# Patient Record
Sex: Male | Born: 1955 | Hispanic: Yes | Marital: Married
Health system: Southern US, Academic
[De-identification: ages and names within clinical notes are randomized; demographics above are authoritative.]

## PROBLEM LIST (undated history)

## (undated) ENCOUNTER — Ambulatory Visit: Attending: Internal Medicine | Primary: Internal Medicine

## (undated) ENCOUNTER — Encounter
Attending: Student in an Organized Health Care Education/Training Program | Primary: Student in an Organized Health Care Education/Training Program

## (undated) ENCOUNTER — Encounter

## (undated) ENCOUNTER — Telehealth

## (undated) ENCOUNTER — Ambulatory Visit

## (undated) ENCOUNTER — Telehealth
Attending: Student in an Organized Health Care Education/Training Program | Primary: Student in an Organized Health Care Education/Training Program

## (undated) ENCOUNTER — Encounter: Attending: Internal Medicine | Primary: Internal Medicine

## (undated) ENCOUNTER — Ambulatory Visit: Attending: Physician Assistant | Primary: Physician Assistant

## (undated) ENCOUNTER — Ambulatory Visit
Attending: Student in an Organized Health Care Education/Training Program | Primary: Student in an Organized Health Care Education/Training Program

## (undated) ENCOUNTER — Encounter: Attending: Nephrology | Primary: Nephrology

## (undated) ENCOUNTER — Ambulatory Visit: Attending: Pharmacist | Primary: Pharmacist

## (undated) ENCOUNTER — Institutional Professional Consult (permissible substitution)

## (undated) ENCOUNTER — Inpatient Hospital Stay

## (undated) ENCOUNTER — Ambulatory Visit: Attending: Nurse Practitioner | Primary: Nurse Practitioner

## (undated) ENCOUNTER — Ambulatory Visit: Attending: Ophthalmology | Primary: Ophthalmology

## (undated) DIAGNOSIS — E119 Type 2 diabetes mellitus without complications: Secondary | ICD-10-CM

## (undated) DIAGNOSIS — I447 Left bundle-branch block, unspecified: Secondary | ICD-10-CM

## (undated) DIAGNOSIS — Z992 Dependence on renal dialysis: Secondary | ICD-10-CM

## (undated) DIAGNOSIS — G4733 Obstructive sleep apnea (adult) (pediatric): Secondary | ICD-10-CM

## (undated) DIAGNOSIS — G4730 Sleep apnea, unspecified: Secondary | ICD-10-CM

## (undated) DIAGNOSIS — M109 Gout, unspecified: Secondary | ICD-10-CM

## (undated) DIAGNOSIS — N186 End stage renal disease: Secondary | ICD-10-CM

## (undated) DIAGNOSIS — N289 Disorder of kidney and ureter, unspecified: Secondary | ICD-10-CM

## (undated) DIAGNOSIS — I10 Essential (primary) hypertension: Secondary | ICD-10-CM

## (undated) HISTORY — DX: End stage renal disease: N18.6

## (undated) HISTORY — DX: Left bundle-branch block, unspecified: I44.7

## (undated) HISTORY — PX: AV FISTULA PLACEMENT: SHX1204

## (undated) HISTORY — DX: Dependence on renal dialysis: Z99.2

## (undated) HISTORY — PX: OTHER SURGICAL HISTORY: SHX169

## (undated) HISTORY — DX: Obstructive sleep apnea (adult) (pediatric): G47.33

## (undated) MED ORDER — GABAPENTIN 300 MG CAPSULE: 300 mg | capsule | Freq: Every day | 11 refills | 30 days | Status: CN

---

## 2005-07-11 ENCOUNTER — Emergency Department: Payer: Self-pay | Admitting: Emergency Medicine

## 2005-07-22 ENCOUNTER — Ambulatory Visit: Payer: Self-pay | Admitting: Nurse Practitioner

## 2007-10-03 ENCOUNTER — Emergency Department: Payer: Self-pay | Admitting: Emergency Medicine

## 2008-10-01 ENCOUNTER — Emergency Department: Payer: Self-pay | Admitting: Emergency Medicine

## 2008-10-23 ENCOUNTER — Emergency Department: Payer: Self-pay | Admitting: Emergency Medicine

## 2010-07-06 ENCOUNTER — Emergency Department: Payer: Self-pay | Admitting: Emergency Medicine

## 2011-01-21 ENCOUNTER — Emergency Department: Payer: Self-pay | Admitting: Emergency Medicine

## 2013-09-10 ENCOUNTER — Emergency Department: Payer: Self-pay | Admitting: Internal Medicine

## 2013-09-10 LAB — CBC
HCT: 40.4 % (ref 40.0–52.0)
RBC: 4.77 10*6/uL (ref 4.40–5.90)
RDW: 14.1 % (ref 11.5–14.5)
WBC: 8.8 10*3/uL (ref 3.8–10.6)

## 2013-09-10 LAB — CK TOTAL AND CKMB (NOT AT ARMC)
CK, Total: 134 U/L (ref 35–232)
CK-MB: 2.4 ng/mL (ref 0.5–3.6)

## 2013-09-10 LAB — URINALYSIS, COMPLETE
Blood: NEGATIVE
Glucose,UR: >=500 mg/dL (ref 0–75)
Ketone: NEGATIVE
Leukocyte Esterase: NEGATIVE
Nitrite: NEGATIVE
Ph: 7 (ref 4.5–8.0)
Protein: 100
RBC,UR: <1 /HPF (ref 0–5)
Specific Gravity: 1.003 (ref 1.003–1.030)
Squamous Epithelial: NONE SEEN

## 2013-09-10 LAB — COMPREHENSIVE METABOLIC PANEL
Albumin: 2.6 g/dL — ABNORMAL LOW (ref 3.4–5.0)
Alkaline Phosphatase: 141 U/L — ABNORMAL HIGH
Anion Gap: 9 (ref 7–16)
BUN: 43 mg/dL — ABNORMAL HIGH (ref 7–18)
Bilirubin,Total: 0.2 mg/dL (ref 0.2–1.0)
Calcium, Total: 8.5 mg/dL (ref 8.5–10.1)
Co2: 25 mmol/L (ref 21–32)
Creatinine: 2.47 mg/dL — ABNORMAL HIGH (ref 0.60–1.30)
EGFR (African American): 32 — ABNORMAL LOW
EGFR (Non-African Amer.): 28 — ABNORMAL LOW
Osmolality: 289 (ref 275–301)
Potassium: 3.4 mmol/L — ABNORMAL LOW (ref 3.5–5.1)
SGOT(AST): 22 U/L (ref 15–37)

## 2013-09-10 LAB — TROPONIN I: Troponin-I: <0.02 ng/mL

## 2015-11-12 DIAGNOSIS — E119 Type 2 diabetes mellitus without complications: Secondary | ICD-10-CM | POA: Insufficient documentation

## 2015-11-12 DIAGNOSIS — I120 Hypertensive chronic kidney disease with stage 5 chronic kidney disease or end stage renal disease: Secondary | ICD-10-CM | POA: Insufficient documentation

## 2015-11-12 DIAGNOSIS — Y658 Other specified misadventures during surgical and medical care: Secondary | ICD-10-CM | POA: Insufficient documentation

## 2015-11-12 DIAGNOSIS — T82898A Other specified complication of vascular prosthetic devices, implants and grafts, initial encounter: Secondary | ICD-10-CM | POA: Insufficient documentation

## 2015-11-12 DIAGNOSIS — N186 End stage renal disease: Secondary | ICD-10-CM | POA: Insufficient documentation

## 2015-11-12 DIAGNOSIS — M79602 Pain in left arm: Secondary | ICD-10-CM | POA: Insufficient documentation

## 2015-11-12 DIAGNOSIS — Z992 Dependence on renal dialysis: Secondary | ICD-10-CM | POA: Insufficient documentation

## 2015-11-13 ENCOUNTER — Emergency Department: Payer: Self-pay

## 2015-11-13 ENCOUNTER — Encounter: Payer: Self-pay | Admitting: Emergency Medicine

## 2015-11-13 ENCOUNTER — Emergency Department
Admission: EM | Admit: 2015-11-13 | Discharge: 2015-11-13 | Disposition: A | Discharge status: Home / Self Care | Payer: Self-pay | Attending: Emergency Medicine | Admitting: Emergency Medicine

## 2015-11-13 DIAGNOSIS — T82868A Thrombosis of vascular prosthetic devices, implants and grafts, initial encounter: Secondary | ICD-10-CM

## 2015-11-13 DIAGNOSIS — R52 Pain, unspecified: Secondary | ICD-10-CM

## 2015-11-13 DIAGNOSIS — T829XXA Unspecified complication of cardiac and vascular prosthetic device, implant and graft, initial encounter: Secondary | ICD-10-CM

## 2015-11-13 HISTORY — DX: Disorder of kidney and ureter, unspecified: N28.9

## 2015-11-13 HISTORY — DX: Type 2 diabetes mellitus without complications: E11.9

## 2015-11-13 HISTORY — DX: Gout, unspecified: M10.9

## 2015-11-13 HISTORY — DX: Essential (primary) hypertension: I10

## 2015-11-13 LAB — CBC WITH DIFFERENTIAL/PLATELET
BASOS PCT: 1 %
Basophils Absolute: 0.1 10*3/uL (ref 0–0.1)
EOS ABS: 0.3 10*3/uL (ref 0–0.7)
Eosinophils Relative: 4 %
HCT: 30.6 % — ABNORMAL LOW (ref 40.0–52.0)
Hemoglobin: 10.2 g/dL — ABNORMAL LOW (ref 13.0–18.0)
Lymphocytes Relative: 20 %
Lymphs Abs: 1.8 10*3/uL (ref 1.0–3.6)
MCH: 29.3 pg (ref 26.0–34.0)
MCHC: 33.3 g/dL (ref 32.0–36.0)
MCV: 87.9 fL (ref 80.0–100.0)
Monocytes Absolute: 0.9 10*3/uL (ref 0.2–1.0)
Monocytes Relative: 10 %
Neutro Abs: 5.7 10*3/uL (ref 1.4–6.5)
Neutrophils Relative %: 65 %
PLATELETS: 208 10*3/uL (ref 150–440)
RBC: 3.49 MIL/uL — AB (ref 4.40–5.90)
RDW: 14 % (ref 11.5–14.5)
WBC: 8.7 10*3/uL (ref 3.8–10.6)

## 2015-11-13 LAB — BASIC METABOLIC PANEL
ANION GAP: 10 (ref 5–15)
BUN: 60 mg/dL — AB (ref 6–20)
CHLORIDE: 94 mmol/L — AB (ref 101–111)
CO2: 24 mmol/L (ref 22–32)
Calcium: 7.7 mg/dL — ABNORMAL LOW (ref 8.9–10.3)
Creatinine, Ser: 9.65 mg/dL — ABNORMAL HIGH (ref 0.61–1.24)
GFR calc Af Amer: 6 mL/min — ABNORMAL LOW (ref >60)
GFR calc non Af Amer: 5 mL/min — ABNORMAL LOW (ref >60)
GLUCOSE: 261 mg/dL — AB (ref 65–99)
POTASSIUM: 4.9 mmol/L (ref 3.5–5.1)
Sodium: 128 mmol/L — ABNORMAL LOW (ref 135–145)

## 2015-11-13 MED ORDER — IBUPROFEN 100 MG/5ML PO SUSP
ORAL | Status: AC
  Filled 2015-11-13: qty 10

## 2015-11-13 NOTE — ED Notes (Signed)
Pt presents to ED with c/o bruising and slight swelling to his left arm after dialysis on Saturday. Pt states they had access his new fistula for the first time. Pt reports arm is painful to touch. Bruising noted. Pt also reports he has been fatigued today all day.

## 2015-11-13 NOTE — ED Notes (Signed)
Discussed discharge instructions and follow-up care with the patient and spouse, with pt's permission. No questions or concerns at this time. Pt stable at discharge. Interpretation provided by Adin Hector.

## 2015-11-13 NOTE — ED Notes (Signed)
Call placed to ultrasound dept who stated they are calling the reading room now to have [place] results completed.

## 2015-11-13 NOTE — ED Notes (Signed)
MD and interpreter to bedside to give patient an update on delay.

## 2015-11-13 NOTE — ED Provider Notes (Signed)
[facility] Emergency Department Provider Note  ____________________________________________  Time seen: 4:30 AM  I have reviewed the triage vital signs and the nursing notes.   HISTORY  Chief Complaint Fatigue; Bleeding/Bruising; and Vascular Access Problem      HPI Danny [name] is a 60 y.o. male presents with left arm pain and swelling and bruising status post dialysis on Saturday. Patient states current pain score 7 out of 10. Of note patient has a new fistula that was used for the first time this past Saturday.     Past Medical History  Diagnosis Date  . Kidney disease   . Hypertension   . Gout   . Diabetes mellitus without complication ([place])     There are no active problems to display for this patient.   Past Surgical History  Procedure Laterality Date  . Av fistula placement      No current outpatient prescriptions on file.  Allergies No known drug allergies No family history on file.  Social History Social History  Substance Use Topics  . Smoking status: Never Smoker   . Smokeless tobacco: Never Used  . Alcohol Use: No    Review of Systems  Constitutional: Negative for fever. Eyes: Negative for visual changes. ENT: Negative for sore throat. Cardiovascular: Negative for chest pain. Respiratory: Negative for shortness of breath. Gastrointestinal: Negative for abdominal pain, vomiting and diarrhea. Genitourinary: Negative for dysuria. Musculoskeletal: Negative for back pain. Positive for left arm pain and swelling Skin: Negative for rash. Neurological: Negative for headaches, focal weakness or numbness.   10-point ROS otherwise negative.  ____________________________________________   PHYSICAL EXAM:  VITAL SIGNS: ED Triage Vitals  Enc Vitals Group     BP 11/13/15 0116 177/77 mmHg     Pulse Rate 11/13/15 0116 81     Resp 11/13/15 0116 20     Temp 11/13/15 0116 98.2 F (36.8 C)     Temp Source 11/13/15 0116  Oral     SpO2 11/13/15 0116 97 %     Weight 11/13/15 0116 220 lb (99.791 kg)     Height 11/13/15 0116 5\' 8"  (1.727 m)     Head Cir --      Peak Flow --      Pain Score 11/13/15 0116 7     Pain Loc --      Pain Edu? --      Excl. in [place]? --     Constitutional: Alert and oriented. Well appearing and in no distress. Eyes: Conjunctivae are normal. PERRL. Normal extraocular movements. ENT   Head: Normocephalic and atraumatic.   Nose: No congestion/rhinnorhea.   Mouth/Throat: Mucous membranes are moist.   Neck: No stridor. Hematological/Lymphatic/Immunilogical: No cervical lymphadenopathy. Cardiovascular: Normal rate, regular rhythm. Normal and symmetric distal pulses are present in all extremities. No murmurs, rubs, or gallops. Respiratory: Normal respiratory effort without tachypnea nor retractions. Breath sounds are clear and equal bilaterally. No wheezes/rales/rhonchi. Gastrointestinal: Soft and nontender. No distention. There is no CVA tenderness. Genitourinary: deferred Musculoskeletal: Nontender with normal range of motion in all extremities. No joint effusions.  No lower extremity tenderness nor edema. Palpable thrill noted left arm AV fistula Neurologic:  Normal speech and language. No gross focal neurologic deficits are appreciated. Speech is normal.  Skin:  Skin is warm, dry and intact. No rash noted. Psychiatric: Mood and affect are normal. Speech and behavior are normal. Patient exhibits appropriate insight and judgment.  ____________________________________________    LABS (pertinent positives/negatives)  Labs Reviewed  CBC WITH DIFFERENTIAL/PLATELET - Abnormal; Notable for the following:    RBC 3.49 (*)    Hemoglobin 10.2 (*)    HCT 30.6 (*)    All other components within normal limits       INITIAL IMPRESSION / ASSESSMENT AND PLAN / ED COURSE  Pertinent labs & imaging results that were available during my care of the patient were reviewed by me and  considered in my medical decision making (see chart for details).  Patient's care transferred to Dr. Audelia Acton pending results of AV fistular Doppler.  ____________________________________________   FINAL CLINICAL IMPRESSION(S) / ED DIAGNOSES  Final diagnoses:  Complication of arteriovenous dialysis fistula, initial encounter      Gregor Hams, MD 11/14/15 2253

## 2015-11-13 NOTE — ED Notes (Signed)
RN called radiology to inquire on delay of study; unable to reach tech who is busy performing another study. MD called [facility] radiology to inquire about study and was advised that they do not read these studies at night and that the morning MD would read. MD placed order for DVT study at this time. This RN to U/S department; spoke with Cybil who advised that she evaluated vasculature with previous study and no DVT was seen. MD made aware and VORB was received to cancel ordered DVT study. MD to speak with patient regarding delay and POC as it stands at this point.

## 2015-11-13 NOTE — Discharge Instructions (Signed)
Vaya a su dialysis hoy.  Si tiene [employer], se estas mas hinchado, o si tiene otra problemmas, regresse aqui.

## 2015-11-13 NOTE — ED Provider Notes (Addendum)
-----------------------------------------   8:30 AM on 11/13/2015 -----------------------------------------  The patient was signed out to me at 7:30, he is due for dialysis today. However, he has had swelling to his arm since dialysis on Saturday. Ultrasound was obtained by prior physician. Potassium is reassuring. We are awaiting ultrasound read for further determination of disposition per Dr. Saul Fordyce plan. The ultrasound has not yet been read. I did call the radiology department and apparently there are some delay in the images. Once we require those results we formulate a plan for disposition based on the findings.  Schuyler Amor, MD 11/13/15 0831  ----------------------------------------- 9:03 AM on 11/13/2015 -----------------------------------------  A somewhat good pulses, there does seem to be evidence of hematoma which is been gradually tracking down since his last dialysis. However the fistulas well-functioning with a good bruit there is no strong clinical suspicion for deep vein thrombosis or other complication noted at this time. We'll discuss with nephrology. Patient does have follow-up in a few hours for his regular dialysis. We will hopefully be able to get him home in time for his dialysis. He is not markedly anemic at this time, he does have a very mild hyponatremia which is baseline for him, with correction for his glucoses sodium was in the low 130s.. Glucose is however not dangerously high. Dr. Juleen China agrees with management, did look at the ultrasound personally and asked me to discharge the patient to dialysis today and they will decide what to do from there. Patient in no acute distress.  Schuyler Amor, MD 11/13/15 [phone]

## 2016-02-19 ENCOUNTER — Encounter: Payer: Self-pay | Admitting: Emergency Medicine

## 2016-02-19 ENCOUNTER — Emergency Department
Admission: EM | Admit: 2016-02-19 | Discharge: 2016-02-20 | Disposition: A | Discharge status: Home / Self Care | Payer: Self-pay | Attending: Emergency Medicine | Admitting: Emergency Medicine

## 2016-02-19 DIAGNOSIS — Z992 Dependence on renal dialysis: Secondary | ICD-10-CM | POA: Insufficient documentation

## 2016-02-19 DIAGNOSIS — Z79899 Other long term (current) drug therapy: Secondary | ICD-10-CM | POA: Insufficient documentation

## 2016-02-19 DIAGNOSIS — I159 Secondary hypertension, unspecified: Secondary | ICD-10-CM

## 2016-02-19 DIAGNOSIS — Z794 Long term (current) use of insulin: Secondary | ICD-10-CM | POA: Insufficient documentation

## 2016-02-19 DIAGNOSIS — R51 Headache: Secondary | ICD-10-CM

## 2016-02-19 DIAGNOSIS — I120 Hypertensive chronic kidney disease with stage 5 chronic kidney disease or end stage renal disease: Secondary | ICD-10-CM | POA: Insufficient documentation

## 2016-02-19 DIAGNOSIS — N186 End stage renal disease: Secondary | ICD-10-CM | POA: Insufficient documentation

## 2016-02-19 DIAGNOSIS — E1122 Type 2 diabetes mellitus with diabetic chronic kidney disease: Secondary | ICD-10-CM | POA: Insufficient documentation

## 2016-02-19 DIAGNOSIS — R519 Headache, unspecified: Secondary | ICD-10-CM

## 2016-02-19 MED ORDER — ONDANSETRON HCL 4 MG/2ML IJ SOLN
4.0000 mg | Freq: Once | INTRAMUSCULAR | Status: AC
  Administered 2016-02-19: 4 mg via INTRAVENOUS
  Filled 2016-02-19: qty 2

## 2016-02-19 MED ORDER — MORPHINE SULFATE (PF) 2 MG/ML IV SOLN
2.0000 mg | Freq: Once | INTRAVENOUS | Status: AC
  Administered 2016-02-19: 2 mg via INTRAVENOUS
  Filled 2016-02-19: qty 1

## 2016-02-19 NOTE — ED Provider Notes (Signed)
[facility] Emergency Department Provider Note   ____________________________________________  Time seen: Approximately 11:32 PM  I have reviewed the triage vital signs and the nursing notes.   HISTORY  Chief Complaint Headache    HPI Danny [name] is a 60 y.o. male who presents to the ED from home via EMS with a chief complaint of headache. Patient has a history of ESRD formally on HD, started peritoneal dialysis yesterday and is attending all day classes this week to learn how to perform PD at home. Reports gradual onset global headache after class approximately 5 PM.Symptoms associated with photophobia and nausea. Patient also complains of pain at peritoneal dialysis catheter insertion site; catheter has been in place for 1 month. Denies associated fever, chills, vision changes, neck pain, chest pain, shortness of breath, abdominal pain, vomiting, diarrhea. Denies recent travel or trauma. Patient did not try analgesia prior to arrival. Nothing makes his symptoms worse.   Past Medical History  Diagnosis Date  . Kidney disease   . Hypertension   . Gout   . Diabetes mellitus without complication ([place])     There are no active problems to display for this patient.   Past Surgical History  Procedure Laterality Date  . Av fistula placement      Current Outpatient Rx  Name  Route  Sig  Dispense  Refill  . calcitRIOL (ROCALTROL) 0.25 MCG capsule   Oral   Take 0.25 mcg by mouth 3 (three) times a week.         . calcium acetate (PHOSLO) 667 MG capsule   Oral   Take 1,334 mg by mouth 3 (three) times daily with meals.         . citalopram (CELEXA) 20 MG tablet   Oral   Take 10 mg by mouth daily.         . furosemide (LASIX) 40 MG tablet   Oral   Take 40 mg by mouth 2 (two) times daily.         . insulin aspart protamine- aspart (NOVOLOG MIX 70/30) (70-30) 100 UNIT/ML injection   Subcutaneous   Inject 18-20 Units into the skin 2 (two)  times daily with a meal. 20 units in the morning and 18 units at night         . Liraglutide 18 MG/3ML SOPN   Subcutaneous   Inject 1.8 mg into the skin daily.          . prochlorperazine (COMPAZINE) 5 MG tablet      1 tablet every 6 hours as needed for headache/nausea   20 tablet   0     Allergies Review of patient's allergies indicates no known allergies.  Family history None for migraines or cerebral aneurysm  Social History Social History  Substance Use Topics  . Smoking status: Never Smoker   . Smokeless tobacco: Never Used  . Alcohol Use: No    Review of Systems  Constitutional: No fever/chills. Eyes: Positive for photophobia. No visual changes. ENT: No sore throat. Cardiovascular: Denies chest pain. Respiratory: Denies shortness of breath. Gastrointestinal: No abdominal pain.  No nausea, no vomiting.  No diarrhea.  No constipation. Genitourinary: Negative for dysuria. Musculoskeletal: Negative for back pain. Skin: Negative for rash. Neurological: Positive for headache. Negative for focal weakness or numbness.  10-point ROS otherwise negative.  ____________________________________________   PHYSICAL EXAM:  VITAL SIGNS: ED Triage Vitals  Enc Vitals Group     BP 02/19/16 2254 199/99 mmHg  Pulse Rate 02/19/16 2254 87     Resp 02/19/16 2254 22     Temp 02/19/16 2254 98.1 F (36.7 C)     Temp Source 02/19/16 2254 Oral     SpO2 02/19/16 2254 96 %     Weight 02/19/16 2254 220 lb (99.791 kg)     Height 02/19/16 2254 5\' 9"  (1.753 m)     Head Cir --      Peak Flow --      Pain Score 02/19/16 2255 8     Pain Loc --      Pain Edu? --      Excl. in [place]? --     Constitutional: Alert and oriented. Well appearing and in mild acute distress. Eyes: Conjunctivae are normal. PERRL. EOMI. Head: Atraumatic. Nose: No congestion/rhinnorhea. Mouth/Throat: Mucous membranes are moist.  Oropharynx non-erythematous. Neck: No stridor.  Supple neck without  meningismus.  No carotid bruits. Cardiovascular: Normal rate, regular rhythm. Grossly normal heart sounds.  Good peripheral circulation. Respiratory: Normal respiratory effort.  No retractions. Lungs CTAB. Gastrointestinal: Soft and nontender to light and deep palpation. No distention. No abdominal bruits. No CVA tenderness. PD insertion site clean/dry/intact. Musculoskeletal: No lower extremity tenderness nor edema.  No joint effusions. Neurologic:  Normal speech and language. No gross focal neurologic deficits are appreciated. No gait instability. Skin:  Skin is warm, dry and intact. No rash noted. Psychiatric: Mood and affect are normal. Speech and behavior are normal.  ____________________________________________   LABS (all labs ordered are listed, but only abnormal results are displayed)  Labs Reviewed  CBC WITH DIFFERENTIAL/PLATELET - Abnormal; Notable for the following:    WBC 11.7 (*)    RBC 3.76 (*)    Hemoglobin 11.2 (*)    HCT 32.8 (*)    RDW 15.2 (*)    Neutro Abs 9.4 (*)    All other components within normal limits  COMPREHENSIVE METABOLIC PANEL - Abnormal; Notable for the following:    Sodium 133 (*)    Chloride 96 (*)    Glucose, Bld 164 (*)    BUN 60 (*)    Creatinine, Ser 12.11 (*)    Calcium 8.8 (*)    Albumin 3.3 (*)    ALT 14 (*)    GFR calc non Af Amer 4 (*)    GFR calc Af Amer 5 (*)    All other components within normal limits  URINALYSIS COMPLETEWITH MICROSCOPIC ([facility] ONLY) - Abnormal; Notable for the following:    Color, Urine YELLOW (*)    APPearance CLEAR (*)    Glucose, UA >500 (*)    Protein, ur >500 (*)    Squamous Epithelial / LPF 0-5 (*)    All other components within normal limits   ____________________________________________  EKG  ED ECG REPORT I, Laylanie Kruczek J, the attending physician, personally viewed and interpreted this ECG.   Date: 02/20/2016  EKG Time: 0108  Rate: 85  Rhythm: normal EKG, normal sinus rhythm  Axis: Normal   Intervals:none  ST&T Change: Nonspecific  ____________________________________________  RADIOLOGY  CT Head interpreted per Dr. Radene Knee: 1. No acute intracranial pathology seen on CT. 2. Mild cerebellar atrophy noted. ____________________________________________   PROCEDURES  Procedure(s) performed: None  Critical Care performed: No  ____________________________________________   INITIAL IMPRESSION / ASSESSMENT AND PLAN / ED COURSE  Pertinent labs & imaging results that were available during my care of the patient were reviewed by me and considered in my medical decision making (see chart for  details).  60 year old male with ESRD who recently switched to peritoneal dialysis presenting with gradual onset, global headache without focal neurological deficit. Will check electrolytes, obtain CT head, check urinalysis and reassess. There is no clinical concern for SBP on exam.  ----------------------------------------- 1:19 AM on 02/20/2016 -----------------------------------------  Patient is feeling better. Updated patient and family of CT and lab results. Awaiting urinalysis. Will administer hydralazine for elevated blood pressure.   ----------------------------------------- 1:52 AM on 02/20/2016 -----------------------------------------  Patient feeling even better. Blood pressure improved. Updated all on urinalysis results. Patient will resume peritoneal dialysis class in the morning. I have advised that he have the nurse or nephrologist check his peritoneal dialysis catheter. Strict return precautions given. All verbalize understanding and agree with plan of care. ____________________________________________   FINAL CLINICAL IMPRESSION(S) / ED DIAGNOSES  Final diagnoses:  Acute nonintractable headache, unspecified headache type  Secondary hypertension, unspecified      NEW MEDICATIONS STARTED DURING THIS VISIT:  New Prescriptions   PROCHLORPERAZINE (COMPAZINE) 5 MG  TABLET    1 tablet every 6 hours as needed for headache/nausea     Note:  This document was prepared using Dragon voice recognition software and may include unintentional dictation errors.    Paulette Blanch, MD 02/20/16 [phone]

## 2016-02-19 NOTE — ED Notes (Signed)
Pt arrived via ems from home with complaints of severe headache and overall not feeling well. Received dialysis today. EMS vitals BP 220/110, 86HR, & glucose 152.

## 2016-02-20 ENCOUNTER — Emergency Department: Payer: Self-pay

## 2016-02-20 LAB — CBC WITH DIFFERENTIAL/PLATELET
BASOS ABS: 0 10*3/uL (ref 0–0.1)
Basophils Relative: 0 %
Eosinophils Absolute: 0.4 10*3/uL (ref 0–0.7)
Eosinophils Relative: 3 %
HEMATOCRIT: 32.8 % — AB (ref 40.0–52.0)
Hemoglobin: 11.2 g/dL — ABNORMAL LOW (ref 13.0–18.0)
LYMPHS ABS: 1.2 10*3/uL (ref 1.0–3.6)
MCH: 29.9 pg (ref 26.0–34.0)
MCHC: 34.3 g/dL (ref 32.0–36.0)
MCV: 87.3 fL (ref 80.0–100.0)
MONO ABS: 0.7 10*3/uL (ref 0.2–1.0)
NEUTROS ABS: 9.4 10*3/uL — AB (ref 1.4–6.5)
Neutrophils Relative %: 81 %
Platelets: 192 10*3/uL (ref 150–440)
RBC: 3.76 MIL/uL — ABNORMAL LOW (ref 4.40–5.90)
RDW: 15.2 % — AB (ref 11.5–14.5)
WBC: 11.7 10*3/uL — ABNORMAL HIGH (ref 3.8–10.6)

## 2016-02-20 LAB — COMPREHENSIVE METABOLIC PANEL
ALT: 14 U/L — ABNORMAL LOW (ref 17–63)
AST: 15 U/L (ref 15–41)
Albumin: 3.3 g/dL — ABNORMAL LOW (ref 3.5–5.0)
Alkaline Phosphatase: 70 U/L (ref 38–126)
Anion gap: 14 (ref 5–15)
BILIRUBIN TOTAL: 0.4 mg/dL (ref 0.3–1.2)
BUN: 60 mg/dL — AB (ref 6–20)
CO2: 23 mmol/L (ref 22–32)
Calcium: 8.8 mg/dL — ABNORMAL LOW (ref 8.9–10.3)
Chloride: 96 mmol/L — ABNORMAL LOW (ref 101–111)
Creatinine, Ser: 12.11 mg/dL — ABNORMAL HIGH (ref 0.61–1.24)
GFR calc Af Amer: 5 mL/min — ABNORMAL LOW (ref >60)
GFR, EST NON AFRICAN AMERICAN: 4 mL/min — AB (ref >60)
Glucose, Bld: 164 mg/dL — ABNORMAL HIGH (ref 65–99)
POTASSIUM: 4.5 mmol/L (ref 3.5–5.1)
Sodium: 133 mmol/L — ABNORMAL LOW (ref 135–145)
TOTAL PROTEIN: 6.9 g/dL (ref 6.5–8.1)

## 2016-02-20 LAB — URINALYSIS COMPLETE WITH MICROSCOPIC (ARMC ONLY)
BILIRUBIN URINE: NEGATIVE
Bacteria, UA: NONE SEEN
Hgb urine dipstick: NEGATIVE
KETONES UR: NEGATIVE mg/dL
Leukocytes, UA: NEGATIVE
Nitrite: NEGATIVE
Specific Gravity, Urine: 1.014 (ref 1.005–1.030)
pH: 7 (ref 5.0–8.0)

## 2016-02-20 MED ORDER — HYDRALAZINE HCL 20 MG/ML IJ SOLN
10.0000 mg | Freq: Once | INTRAMUSCULAR | Status: AC
  Administered 2016-02-20: 10 mg via INTRAVENOUS
  Filled 2016-02-20: qty 1

## 2016-02-20 MED ORDER — PROCHLORPERAZINE MALEATE 5 MG PO TABS: ORAL_TABLET | ORAL | Status: DC

## 2016-02-20 NOTE — Discharge Instructions (Signed)
1. You may take Compazine (#20) as needed for headache. 2. Return to the ER for worsening symptoms, persistent vomiting, difficulty breathing or other concerns.  Dolor de cabeza general sin causa (General Headache Without Cause) El dolor de cabeza es un dolor o malestar que se siente en la zona de la cabeza o del cuello. Puede no tener una causa especfica. Hay muchas causas y tipos de dolores de [place]. Los dolores de cabeza ms comunes son los siguientes:  Cefalea tensional.  Cefaleas migraosas.  Cefalea en brotes.  Cefaleas diarias crnicas. INSTRUCCIONES PARA EL CUIDADO EN EL HOGAR  Controle su afeccin para ver si hay cambios. Siga estos pasos para Aeronautical engineer afeccin: Control del [employer] medicamentos de venta libre y los recetados solamente como se lo haya indicado el mdico.  Cuando sienta dolor de cabeza acustese en un cuarto oscuro y tranquilo.  Si se lo indican, aplique hielo sobre la cabeza y la zona del cuello:  Ponga el hielo en una bolsa plstica.  Coloque una toalla entre la piel y la bolsa de hielo.  Coloque el hielo durante 74minutos, 2 a 3veces por Training and development officer.  Utilice una almohadilla trmica o tome una ducha con agua caliente para aplicar calor en la cabeza y la zona del cuello como se lo haya indicado el [place] luces tenues si le [employer] luces brillantes o sus dolores de cabeza empeoran. Comida y bebida  Mantenga un horario para las comidas.  Limite el consumo de bebidas alcohlicas.  Consuma menos cantidad de cafena o deje de tomarla. Instrucciones generales  Concurra a todas las visitas de control como se lo haya indicado el mdico. Esto es importante.  Lleve un diario de los dolores de cabeza para Neurosurgeon qu factores pueden desencadenarlos. Por ejemplo, escriba los siguientes datos:  Lo que usted come y Buyer, retail.  Cunto tiempo duerme.  Algn cambio en su dieta o en los medicamentos.  Pruebe algunas tcnicas de  relajacin, como los [place].  Limite el estrs.  Sintese con la espalda recta y no tense los msculos.  No consuma productos que contengan tabaco, incluidos cigarrillos, tabaco de Higher education careers adviser o cigarrillos electrnicos. Si necesita ayuda para dejar de fumar, consulte al mdico.  Haga actividad fsica habitualmente como se lo haya indicado el mdico.  Tenga un horario fijo para dormir. Duerma entre 7 y 9horas o la cantidad de horas que le haya recomendado el mdico. SOLICITE ATENCIN MDICA SI:   Los medicamentos no Dealer los sntomas.  Tiene un dolor de cabeza que es diferente del dolor de cabeza habitual.  Tiene nuseas o vmitos.  Tiene fiebre. SOLICITE ATENCIN MDICA DE INMEDIATO SI:   El dolor se hace cada vez ms intenso.  Ha vomitado repetidas veces.  Presenta rigidez en el cuello.  Sufre prdida de la visin.  Tiene problemas para hablar.  Siente dolor en el ojo o en el odo.  Presenta debilidad muscular o prdida del control muscular.  Pierde el equilibrio o tiene problemas para Writer.  Sufre mareos o se desmaya.  Se siente confundido.   Esta informacin no tiene Marine scientist el consejo del mdico. Asegrese de hacerle al mdico cualquier pregunta que tenga.   Document Released: 06/25/2005 Document Revised: 06/06/2015 Elsevier Interactive Patient Education 2016 [place].  Hipertensin (Hypertension) La hipertensin, conocida comnmente como presin arterial alta, se produce cuando la sangre bombea en las arterias con mucha fuerza. Las arterias son los vasos sanguneos que transportan la [place]  desde el corazn hacia todas las partes del cuerpo. Una lectura de la presin arterial consiste en un nmero ms alto sobre un nmero ms bajo, por ejemplo, 110/72. El nmero ms alto (presin sistlica) corresponde a la presin interna de las arterias cuando el corazn [place]. El nmero ms bajo (presin diastlica) corresponde a la presin  interna de las arterias cuando el corazn se relaja. En condiciones ideales, la presin arterial debe ser inferior a 120/80. La hipertensin fuerza al corazn a trabajar ms para Herbalist. Las arterias pueden estrecharse o ponerse rgidas. La hipertensin no tratada o no controlada puede causar infarto de miocardio, ictus, enfermedad renal y otros problemas. [place] de riesgo de hipertensin son controlables, pero otros no lo son.  Entre los factores de riesgo que usted no puede Chief Technology Officer, se [employer] siguientes:   La raza. El riesgo es mayor para las Retail banker.  La edad. Los riesgos aumentan con la edad.  El sexo. Antes de los 45aos, los hombres corren ms [employer]. Despus de los 65aos, las mujeres corren ms [employer]. Entre los factores de riesgo que usted puede Chief Technology Officer, se [employer] siguientes:  No hacer la cantidad suficiente de actividad fsica o ejercicio.  Tener sobrepeso.  Consumir mucha grasa, azcar, caloras o sal en la dieta.  Beber alcohol en exceso. SIGNOS Y SNTOMAS Por lo general, la hipertensin no causa signos o sntomas. La hipertensin arterial demasiado alta (crisis hipertensiva) puede causar dolor de cabeza, ansiedad, falta de aire y hemorragia nasal. DIAGNSTICO Para detectar si usted tiene hipertensin, el mdico le medir la presin arterial mientras est sentado, con el brazo levantado a la altura del corazn. Debe medirla al [facility] veces en el mismo brazo. Determinadas condiciones pueden causar una diferencia de presin arterial entre el brazo izquierdo y Insurance underwriter. El hecho de tener una sola lectura de la presin arterial ms alta que lo normal no significa que Stage manager. Si no est claro si tiene hipertensin arterial, es posible que se le pida que regrese otro da para volver a controlarle la presin arterial. O bien se le puede pedir que se controle la  presin arterial en su casa durante 1 o ms meses. [place] hipertensin arterial incluye hacer cambios en el estilo de vida y, posiblemente, tomar medicamentos. Un estilo de vida saludable puede ayudar a bajar la presin arterial alta. Quiz deba cambiar algunos hbitos. Los cambios en el estilo de vida pueden incluir lo siguiente:  Seguir la dieta DASH. Esta dieta tiene un alto contenido de frutas, verduras y Psychologist, prison and probation services. Incluye poca cantidad de sal, carnes rojas y azcares agregados.  Mantenga el consumo de sodio por debajo de 2300 mg por da.  Realizar al [employer] 30 y 86 minutos de ejercicio [place], 4 veces por semana como mnimo.  Perder peso, si es necesario.  No fumar.  Limitar el consumo de bebidas alcohlicas.  Aprender formas de reducir el estrs. El mdico puede recetarle medicamentos si los cambios en el estilo de vida no son suficientes para Child psychotherapist la presin arterial y si una de las siguientes afirmaciones es verdadera:  Fidel Levy 74 y 42 aos y su presin arterial sistlica est por encima de 140.  Tiene 60 aos o ms y su presin arterial sistlica est por encima de 150.  Su presin arterial diastlica est por encima de 90.  Tiene diabetes y su presin arterial  sistlica est por encima de 140 o su presin arterial diastlica est por encima de 90.  Tiene una enfermedad renal y su presin arterial est por encima de 140/90.  Tiene una enfermedad cardaca y su presin arterial est por encima de 140/90. La presin arterial deseada puede variar en funcin de las enfermedades, la edad y otros factores personales. INSTRUCCIONES PARA EL CUIDADO EN EL HOGAR  Haga que le midan de nuevo la presin arterial segn las indicaciones del [place] los medicamentos solamente como se lo haya indicado el mdico. Siga cuidadosamente las indicaciones. Los medicamentos para la presin arterial deben tomarse segn las  indicaciones. Los medicamentos pierden eficacia al omitir las dosis. El hecho de omitir las dosis tambin [place] el riesgo de otros problemas.  No fume.  Contrlese la presin arterial en su casa segn las indicaciones del mdico. SOLICITE ATENCIN MDICA SI:   Piensa que tiene una reaccin alrgica a los medicamentos.  Tiene mareos o dolores de cabeza con Scientist, research (physical sciences).  Tiene hinchazn en los tobillos.  Tiene problemas de visin. SOLICITE ATENCIN MDICA DE INMEDIATO SI:  Siente un dolor de cabeza intenso o confusin.  Siente debilidad inusual, adormecimiento o que Geneticist, molecular.  Siente dolor intenso en el pecho o en el abdomen.  Vomita repetidas veces.  Tiene dificultad para respirar. ASEGRESE DE QUE:   Comprende estas instrucciones.  Controlar su afeccin.  Recibir ayuda de inmediato si no mejora o si empeora.   Esta informacin no tiene Marine scientist el consejo del mdico. Asegrese de hacerle al mdico cualquier pregunta que tenga.   Document Released: 09/15/2005 Document Revised: 01/30/2015 Elsevier Interactive Patient Education [employer].

## 2016-05-13 ENCOUNTER — Inpatient Hospital Stay
Admission: EM | Admit: 2016-05-13 | Discharge: 2016-05-14 | DRG: 189 | Disposition: A | Discharge status: Home / Self Care | Payer: Self-pay | Attending: Internal Medicine | Admitting: Internal Medicine

## 2016-05-13 ENCOUNTER — Emergency Department: Payer: Self-pay

## 2016-05-13 DIAGNOSIS — R0789 Other chest pain: Secondary | ICD-10-CM

## 2016-05-13 DIAGNOSIS — N2581 Secondary hyperparathyroidism of renal origin: Secondary | ICD-10-CM | POA: Diagnosis present

## 2016-05-13 DIAGNOSIS — Z794 Long term (current) use of insulin: Secondary | ICD-10-CM

## 2016-05-13 DIAGNOSIS — N186 End stage renal disease: Secondary | ICD-10-CM | POA: Diagnosis present

## 2016-05-13 DIAGNOSIS — E8770 Fluid overload, unspecified: Secondary | ICD-10-CM | POA: Diagnosis present

## 2016-05-13 DIAGNOSIS — Z992 Dependence on renal dialysis: Secondary | ICD-10-CM

## 2016-05-13 DIAGNOSIS — I120 Hypertensive chronic kidney disease with stage 5 chronic kidney disease or end stage renal disease: Secondary | ICD-10-CM | POA: Diagnosis present

## 2016-05-13 DIAGNOSIS — E875 Hyperkalemia: Secondary | ICD-10-CM | POA: Diagnosis present

## 2016-05-13 DIAGNOSIS — E1122 Type 2 diabetes mellitus with diabetic chronic kidney disease: Secondary | ICD-10-CM | POA: Diagnosis present

## 2016-05-13 DIAGNOSIS — Z79899 Other long term (current) drug therapy: Secondary | ICD-10-CM

## 2016-05-13 DIAGNOSIS — J810 Acute pulmonary edema: Principal | ICD-10-CM | POA: Diagnosis present

## 2016-05-13 DIAGNOSIS — J811 Chronic pulmonary edema: Secondary | ICD-10-CM | POA: Diagnosis present

## 2016-05-13 DIAGNOSIS — D638 Anemia in other chronic diseases classified elsewhere: Secondary | ICD-10-CM | POA: Diagnosis present

## 2016-05-13 HISTORY — DX: Dependence on renal dialysis: Z99.2

## 2016-05-13 LAB — BASIC METABOLIC PANEL
Anion gap: 12 (ref 5–15)
BUN: 48 mg/dL — ABNORMAL HIGH (ref 6–20)
CHLORIDE: 101 mmol/L (ref 101–111)
CO2: 24 mmol/L (ref 22–32)
CREATININE: 10.57 mg/dL — AB (ref 0.61–1.24)
Calcium: 8.9 mg/dL (ref 8.9–10.3)
GFR, EST AFRICAN AMERICAN: 5 mL/min — AB (ref >60)
GFR, EST NON AFRICAN AMERICAN: 5 mL/min — AB (ref >60)
Glucose, Bld: 130 mg/dL — ABNORMAL HIGH (ref 65–99)
Potassium: 5.5 mmol/L — ABNORMAL HIGH (ref 3.5–5.1)
SODIUM: 137 mmol/L (ref 135–145)

## 2016-05-13 LAB — CBC WITH DIFFERENTIAL/PLATELET
BASOS PCT: 1 %
Basophils Absolute: 0.1 10*3/uL (ref 0–0.1)
EOS ABS: 0.3 10*3/uL (ref 0–0.7)
Eosinophils Relative: 3 %
HCT: 28.2 % — ABNORMAL LOW (ref 40.0–52.0)
HEMOGLOBIN: 9.5 g/dL — AB (ref 13.0–18.0)
Lymphocytes Relative: 14 %
Lymphs Abs: 1.2 10*3/uL (ref 1.0–3.6)
MCH: 30.2 pg (ref 26.0–34.0)
MCHC: 33.7 g/dL (ref 32.0–36.0)
MCV: 89.7 fL (ref 80.0–100.0)
MONOS PCT: 8 %
Monocytes Absolute: 0.7 10*3/uL (ref 0.2–1.0)
NEUTROS PCT: 74 %
Neutro Abs: 6.9 10*3/uL — ABNORMAL HIGH (ref 1.4–6.5)
PLATELETS: 258 10*3/uL (ref 150–440)
RBC: 3.14 MIL/uL — AB (ref 4.40–5.90)
RDW: 15.9 % — ABNORMAL HIGH (ref 11.5–14.5)
WBC: 9.1 10*3/uL (ref 3.8–10.6)

## 2016-05-13 LAB — RENAL FUNCTION PANEL
ALBUMIN: 3.7 g/dL (ref 3.5–5.0)
ANION GAP: 15 (ref 5–15)
BUN: 21 mg/dL — ABNORMAL HIGH (ref 6–20)
CALCIUM: 8.9 mg/dL (ref 8.9–10.3)
CO2: 27 mmol/L (ref 22–32)
Chloride: 95 mmol/L — ABNORMAL LOW (ref 101–111)
Creatinine, Ser: 6.34 mg/dL — ABNORMAL HIGH (ref 0.61–1.24)
GFR, EST AFRICAN AMERICAN: 10 mL/min — AB (ref >60)
GFR, EST NON AFRICAN AMERICAN: 9 mL/min — AB (ref >60)
GLUCOSE: 282 mg/dL — AB (ref 65–99)
PHOSPHORUS: 3.8 mg/dL (ref 2.5–4.6)
POTASSIUM: 4.4 mmol/L (ref 3.5–5.1)
SODIUM: 137 mmol/L (ref 135–145)

## 2016-05-13 LAB — CBC
HEMATOCRIT: 31.3 % — AB (ref 40.0–52.0)
HEMOGLOBIN: 10.5 g/dL — AB (ref 13.0–18.0)
MCH: 29.9 pg (ref 26.0–34.0)
MCHC: 33.6 g/dL (ref 32.0–36.0)
MCV: 89.1 fL (ref 80.0–100.0)
Platelets: 265 10*3/uL (ref 150–440)
RBC: 3.51 MIL/uL — AB (ref 4.40–5.90)
RDW: 15.9 % — ABNORMAL HIGH (ref 11.5–14.5)
WBC: 7.8 10*3/uL (ref 3.8–10.6)

## 2016-05-13 LAB — TROPONIN I
TROPONIN I: 0.04 ng/mL — AB (ref <0.03)
TROPONIN I: 0.04 ng/mL — AB (ref <0.03)
Troponin I: 0.03 ng/mL (ref <0.03)
Troponin I: 0.03 ng/mL (ref <0.03)

## 2016-05-13 LAB — CREATININE, SERUM
CREATININE: 6.22 mg/dL — AB (ref 0.61–1.24)
GFR, EST AFRICAN AMERICAN: 10 mL/min — AB (ref >60)
GFR, EST NON AFRICAN AMERICAN: 9 mL/min — AB (ref >60)

## 2016-05-13 LAB — GLUCOSE, CAPILLARY: Glucose-Capillary: 323 mg/dL — ABNORMAL HIGH (ref 65–99)

## 2016-05-13 MED ORDER — HEPARIN SODIUM (PORCINE) 5000 UNIT/ML IJ SOLN
5000.0000 [IU] | Freq: Three times a day (TID) | INTRAMUSCULAR | Status: DC
  Administered 2016-05-13 – 2016-05-14 (×2): 5000 [IU] via SUBCUTANEOUS
  Filled 2016-05-13 (×2): qty 1

## 2016-05-13 MED ORDER — HEPARIN SODIUM (PORCINE) 1000 UNIT/ML DIALYSIS
1000.0000 [IU] | INTRAMUSCULAR | Status: DC | PRN
  Filled 2016-05-13: qty 1

## 2016-05-13 MED ORDER — CALCITRIOL 0.25 MCG PO CAPS
0.2500 ug | ORAL_CAPSULE | ORAL | Status: DC
  Administered 2016-05-14: 0.25 ug via ORAL
  Filled 2016-05-13: qty 1

## 2016-05-13 MED ORDER — LIDOCAINE HCL (PF) 1 % IJ SOLN
5.0000 mL | INTRAMUSCULAR | Status: DC | PRN
  Filled 2016-05-13: qty 5

## 2016-05-13 MED ORDER — INSULIN ASPART 100 UNIT/ML ~~LOC~~ SOLN
0.0000 [IU] | Freq: Three times a day (TID) | SUBCUTANEOUS | Status: DC
  Administered 2016-05-14 (×2): 2 [IU] via SUBCUTANEOUS
  Filled 2016-05-13 (×2): qty 2

## 2016-05-13 MED ORDER — LIRAGLUTIDE 18 MG/3ML ~~LOC~~ SOPN: 1.8000 mg | PEN_INJECTOR | Freq: Every day | SUBCUTANEOUS | Status: DC

## 2016-05-13 MED ORDER — CITALOPRAM HYDROBROMIDE 10 MG PO TABS
10.0000 mg | ORAL_TABLET | Freq: Every day | ORAL | Status: DC
  Administered 2016-05-13 – 2016-05-14 (×2): 10 mg via ORAL
  Filled 2016-05-13 (×2): qty 1

## 2016-05-13 MED ORDER — SODIUM CHLORIDE 0.9% FLUSH
3.0000 mL | Freq: Two times a day (BID) | INTRAVENOUS | Status: DC
  Administered 2016-05-13 – 2016-05-14 (×2): 3 mL via INTRAVENOUS

## 2016-05-13 MED ORDER — SODIUM CHLORIDE 0.9 % IV SOLN: 100.0000 mL | INTRAVENOUS | Status: DC | PRN

## 2016-05-13 MED ORDER — ACETAMINOPHEN 325 MG PO TABS: 650.0000 mg | ORAL_TABLET | Freq: Four times a day (QID) | ORAL | Status: DC | PRN

## 2016-05-13 MED ORDER — ONDANSETRON HCL 4 MG PO TABS: 4.0000 mg | ORAL_TABLET | Freq: Four times a day (QID) | ORAL | Status: DC | PRN

## 2016-05-13 MED ORDER — ACETAMINOPHEN 650 MG RE SUPP: 650.0000 mg | Freq: Four times a day (QID) | RECTAL | Status: DC | PRN

## 2016-05-13 MED ORDER — PENTAFLUOROPROP-TETRAFLUOROETH EX AERO
1.0000 "application " | INHALATION_SPRAY | CUTANEOUS | Status: DC | PRN
  Filled 2016-05-13: qty 30

## 2016-05-13 MED ORDER — INSULIN ASPART PROT & ASPART (70-30 MIX) 100 UNIT/ML ~~LOC~~ SUSP
18.0000 [IU] | Freq: Two times a day (BID) | SUBCUTANEOUS | Status: DC
  Administered 2016-05-14: 20 [IU] via SUBCUTANEOUS
  Filled 2016-05-13: qty 20

## 2016-05-13 MED ORDER — LIDOCAINE-PRILOCAINE 2.5-2.5 % EX CREA
1.0000 "application " | TOPICAL_CREAM | CUTANEOUS | Status: DC | PRN
  Filled 2016-05-13: qty 5

## 2016-05-13 MED ORDER — ALTEPLASE 2 MG IJ SOLR: 2.0000 mg | Freq: Once | INTRAMUSCULAR | Status: DC | PRN

## 2016-05-13 MED ORDER — CALCIUM ACETATE (PHOS BINDER) 667 MG PO CAPS
1334.0000 mg | ORAL_CAPSULE | Freq: Three times a day (TID) | ORAL | Status: DC
  Administered 2016-05-14 (×2): 1334 mg via ORAL
  Filled 2016-05-13 (×2): qty 2

## 2016-05-13 MED ORDER — INSULIN ASPART 100 UNIT/ML ~~LOC~~ SOLN
0.0000 [IU] | Freq: Every day | SUBCUTANEOUS | Status: DC
  Administered 2016-05-13: 3 [IU] via SUBCUTANEOUS
  Filled 2016-05-13: qty 3
  Filled 2016-05-13: qty 4

## 2016-05-13 MED ORDER — CALCIUM ACETATE (PHOS BINDER) 667 MG PO CAPS
1334.0000 mg | ORAL_CAPSULE | Freq: Once | ORAL | Status: AC
  Administered 2016-05-13: 1334 mg via ORAL
  Filled 2016-05-13: qty 2

## 2016-05-13 MED ORDER — HYDRALAZINE HCL 20 MG/ML IJ SOLN
10.0000 mg | Freq: Four times a day (QID) | INTRAMUSCULAR | Status: DC | PRN
  Administered 2016-05-13: 10 mg via INTRAVENOUS
  Filled 2016-05-13: qty 1

## 2016-05-13 MED ORDER — FUROSEMIDE 40 MG PO TABS
40.0000 mg | ORAL_TABLET | Freq: Two times a day (BID) | ORAL | Status: DC
  Administered 2016-05-13 – 2016-05-14 (×2): 40 mg via ORAL
  Filled 2016-05-13 (×2): qty 1

## 2016-05-13 MED ORDER — ONDANSETRON HCL 4 MG/2ML IJ SOLN: 4.0000 mg | Freq: Four times a day (QID) | INTRAMUSCULAR | Status: DC | PRN

## 2016-05-13 NOTE — H&P (Signed)
[place] [facility] [place] NAME: Blaire Shaner    MR#:  [id number]  DATE OF BIRTH:  1956/03/12  DATE OF ADMISSION:  05/13/2016  PRIMARY CARE PHYSICIAN: Dr. Urban Gibson  REQUESTING/REFERRING PHYSICIAN: Dr. Lisa Roca  CHIEF COMPLAINT:   Chief Complaint  Patient presents with  . Cough  . Chest Pain    HISTORY OF PRESENT ILLNESS:  Kiryn Lersch  is a 60 y.o. male with a known history of Hypertension, insulin-dependent diabetes mellitus, end-stage renal disease started on dialysis in October 2016 presents to the hospital secondary to chest pressure and shortness of breath that started last night. Patient's last dialysis was per schedule last Saturday. He said he felt fine after the dialysis. Last night he woke up in the middle of the night, with significant chest pressure with associated nausea and difficulty breathing. Since then he was unable to lay flat due to difficulty breathing. His urine output is minimal considering he is a dialysis patient. He is on Lasix every day. Denies eating any extra salt or drinking extra fluids over the weekend. Is due for dialysis today, but due to his chest pressure presents to the emergency room. Troponin is 0.04 and remained stable. Chest x-ray with significant pulmonary edema. No prior cardiac history noted. Also complains of dry cough.  PAST MEDICAL HISTORY:   Past Medical History:  Diagnosis Date  . Diabetes mellitus without complication ([place])   . Dialysis patient [facility])    tuesday, thursday and saturday  . Gout   . Hypertension   . Kidney disease    PAST SURGICAL HISTORY:   Past Surgical History:  Procedure Laterality Date  . AV FISTULA PLACEMENT    . miscellaneous     peritoneal dialysis catheter placement and removal    SOCIAL HISTORY:   Social History  Substance Use Topics  . Smoking status: Never Smoker  . Smokeless tobacco: Never Used  . Alcohol use No    FAMILY HISTORY:   Family  History  Problem Relation Age of Onset  . Diabetes Mother   . Prostate cancer Father   . Kidney disease Sister     DRUG ALLERGIES:  No Known Allergies  REVIEW OF SYSTEMS:   Review of Systems  Constitutional: Positive for malaise/fatigue. Negative for chills, fever and weight loss.  HENT: Negative for ear discharge, ear pain, hearing loss and nosebleeds.   Eyes: Negative for blurred vision, double vision and photophobia.  Respiratory: Positive for cough and shortness of breath. Negative for hemoptysis and wheezing.   Cardiovascular: Positive for chest pain and orthopnea. Negative for palpitations and leg swelling.  Gastrointestinal: Positive for nausea. Negative for abdominal pain, constipation, diarrhea, heartburn, melena and vomiting.  Genitourinary: Negative for dysuria, frequency, hematuria and urgency.  Musculoskeletal: Negative for back pain, myalgias and neck pain.  Skin: Negative for rash.  Neurological: Negative for dizziness, tingling, tremors, sensory change, speech change, focal weakness and headaches.  Endo/Heme/Allergies: Does not bruise/bleed easily.  Psychiatric/Behavioral: Negative for depression.    MEDICATIONS AT HOME:   Prior to Admission medications   Medication Sig Start Date End Date Taking? Authorizing Provider  calcitRIOL (ROCALTROL) 0.25 MCG capsule Take 0.25 mcg by mouth 3 (three) times a week.   Yes Historical Provider, MD  calcium acetate (PHOSLO) 667 MG capsule Take 1,334 mg by mouth 3 (three) times daily with meals.   Yes Historical Provider, MD  citalopram (CELEXA) 20 MG tablet Take 10 mg by  mouth daily.   Yes Historical Provider, MD  furosemide (LASIX) 40 MG tablet Take 40 mg by mouth 2 (two) times daily.   Yes Historical Provider, MD  insulin aspart protamine- aspart (NOVOLOG MIX 70/30) (70-30) 100 UNIT/ML injection Inject 18-20 Units into the skin 2 (two) times daily with a meal. 20 units in the morning and 18 units at night   Yes Historical  Provider, MD  Liraglutide 18 MG/3ML SOPN Inject 1.8 mg into the skin daily.    Yes Historical Provider, MD  prochlorperazine (COMPAZINE) 5 MG tablet 1 tablet every 6 hours as needed for headache/nausea 02/20/16  Yes Paulette Blanch, MD      VITAL SIGNS:  Blood pressure (!) 179/85, pulse 81, temperature 98.5 F (36.9 C), temperature source Oral, resp. rate 17, height 5\' 8"  (1.727 m), weight 104 kg (229 lb 4.5 oz), SpO2 99 %.  PHYSICAL EXAMINATION:   Physical Exam  GENERAL:  60 y.o.-year-old patient sitting in the bed with no acute distress.  EYES: Pupils equal, round, reactive to light and accommodation. No scleral icterus. Extraocular muscles intact.  HEENT: Head atraumatic, normocephalic. Oropharynx and nasopharynx clear.  NECK:  Supple, no jugular venous distention. No thyroid enlargement, no tenderness.  LUNGS: Normal breath sounds bilaterally, no wheezing,rhonchi. Fine bibasilar crackles heard. No use of accessory muscles of respiration.  CARDIOVASCULAR: S1, S2 normal. No murmurs, rubs, or gallops.  ABDOMEN: Soft, nontender, nondistended. Bowel sounds present. No organomegaly or mass.  EXTREMITIES: No pedal edema, cyanosis, or clubbing. Left upper arm AV fistula present with good thrill, 1+ left hand edema noted. NEUROLOGIC: Cranial nerves II through XII are intact. Muscle strength 5/5 in all extremities. Sensation intact. Gait not checked.  PSYCHIATRIC: The patient is alert and oriented x 3.  SKIN: No obvious rash, lesion, or ulcer.   LABORATORY PANEL:   CBC  Recent Labs Lab 05/13/16 0840  WBC 9.1  HGB 9.5*  HCT 28.2*  PLT 258   ------------------------------------------------------------------------------------------------------------------  Chemistries   Recent Labs Lab 05/13/16 0840  NA 137  K 5.5*  CL 101  CO2 24  GLUCOSE 130*  BUN 48*  CREATININE 10.57*  CALCIUM 8.9    ------------------------------------------------------------------------------------------------------------------  Cardiac Enzymes  Recent Labs Lab 05/13/16 1223  TROPONINI 0.04*   ------------------------------------------------------------------------------------------------------------------  RADIOLOGY:  Dg Chest Port 1 View  Result Date: 05/13/2016 CLINICAL DATA:  Cough and shortness of Breath EXAM: PORTABLE CHEST 1 VIEW COMPARISON:  09/10/2013 FINDINGS: Cardiac shadow is mildly enlarged. Aortic calcifications are again noted. Increased vascular congestion and interstitial edema is noted likely related to volume overload due to the patient's missed dialysis session. No focal infiltrate is seen. No bony abnormality is noted. IMPRESSION: Volume overload and pulmonary edema likely related to recently missed dialysis. Electronically Signed   By: Inez Catalina M.D.   On: 05/13/2016 08:23    EKG:   Orders placed or performed during the hospital encounter of 05/13/16  . EKG 12-Lead  . EKG 12-Lead  . ED EKG  . ED EKG    IMPRESSION AND PLAN:   Chadric Omohundro  is a 60 y.o. male with a known history of Hypertension, insulin-dependent diabetes mellitus, end-stage renal disease started on dialysis in October 2016 presents to the hospital secondary to chest pressure and shortness of breath that started last night.  #1 acute pulmonary edema-fluid retention. Denies any dietary noncompliance. - admit for dialysis. -Nephrology consulted. Continue Lasix twice a day. Low sodium diet recommended. Also fluid restriction. -check echocardiogram -  Recycle troponins to rule out any cardiac cause for his chest pressure.  #2 hyperkalemia-secondary to renal failure. Due for dialysis today.  #3 hypertension-not on any home medications. IV hydralazine here as needed. Likely will improve after dialysis  #4 diabetes mellitus-continue with victoza and home insulin. Also sliding scale insulin.  #5 DVT  prophylaxis-on subcutaneous heparin  #6 anemia of chronic disease-monitor. Likely Epogen with dialysis     All the records are reviewed and case discussed with ED provider. Management plans discussed with the patient, family and they are in agreement.  CODE STATUS: Full code  TOTAL TIME TAKING CARE OF THIS PATIENT: 50 minutes.    Gladstone Lighter M.D on 05/13/2016 at 2:33 PM  Between 7am to 6pm - Pager - [phone]  After 6pm go to [email or web address] - password [email or web address] [place] [facility]  Office  [phone]  CC: Primary care physician; Pcp Not In System

## 2016-05-13 NOTE — Progress Notes (Signed)
PRE HD ASSESSMENT 

## 2016-05-13 NOTE — Progress Notes (Signed)
Post hd assessment 

## 2016-05-13 NOTE — ED Notes (Signed)
Patient transported to dialysis via wheelchair by Ronalee Belts, [employer].

## 2016-05-13 NOTE — Progress Notes (Signed)
POST HD VITALS

## 2016-05-13 NOTE — ED Triage Notes (Signed)
Pt c/o cough with SOB and tightness in chest since yesterday and when he called dialysis center they referred him to the ED.. States he receives dialysis tues, thurs and saturdays.. Did not receive dialysis today

## 2016-05-13 NOTE — Progress Notes (Signed)
END OF HD 

## 2016-05-13 NOTE — ED Provider Notes (Signed)
[facility] Emergency Department Provider Note ____________________________________________   I have reviewed the triage vital signs and the triage nursing note.  HISTORY  Chief Complaint Cough and Chest Pain   Historian Patient and wife  HPI Danny [name] is a 60 y.o. male dialysis patient, dialyzes TTHS, due for dialysis today.  He describes onset of chest heaviness and chest discomfort with shortness of breath since last night at midnight. It's worse when he is lying flat and he feels like he has to cough. Cough is nonproductive. No fevers or chills. No nausea or vomiting. No history of coronary artery disease. No palpitations. He states he does not know if he is above his dry weight. He reports that he has not had an episode like this before where he would've had pulmonary edema before dialysis.  Symptoms are moderate especially when he is lying flat. However still present when he is sitting on the stretcher.    Past Medical History:  Diagnosis Date  . Diabetes mellitus without complication ([place])   . Dialysis patient [facility])    tuesday, thursday and saturday  . Gout   . Hypertension   . Kidney disease     There are no active problems to display for this patient.   Past Surgical History:  Procedure Laterality Date  . AV FISTULA PLACEMENT      Prior to Admission medications   Medication Sig Start Date End Date Taking? Authorizing Provider  calcitRIOL (ROCALTROL) 0.25 MCG capsule Take 0.25 mcg by mouth 3 (three) times a week.   Yes Historical Provider, MD  calcium acetate (PHOSLO) 667 MG capsule Take 1,334 mg by mouth 3 (three) times daily with meals.   Yes Historical Provider, MD  citalopram (CELEXA) 20 MG tablet Take 10 mg by mouth daily.   Yes Historical Provider, MD  furosemide (LASIX) 40 MG tablet Take 40 mg by mouth 2 (two) times daily.   Yes Historical Provider, MD  insulin aspart protamine- aspart (NOVOLOG MIX 70/30) (70-30) 100 UNIT/ML  injection Inject 18-20 Units into the skin 2 (two) times daily with a meal. 20 units in the morning and 18 units at night   Yes Historical Provider, MD  Liraglutide 18 MG/3ML SOPN Inject 1.8 mg into the skin daily.    Yes Historical Provider, MD  prochlorperazine (COMPAZINE) 5 MG tablet 1 tablet every 6 hours as needed for headache/nausea 02/20/16  Yes Paulette Blanch, MD    No Known Allergies  No family history on file.  Social History Social History  Substance Use Topics  . Smoking status: Never Smoker  . Smokeless tobacco: Never Used  . Alcohol use No    Review of Systems  Constitutional: Negative for fever. Eyes: Negative for visual changes. ENT: Negative for sore throat. Cardiovascular: Positive for central chest pressure across both sides and the top of the chest. No pleuritic chest pain. Respiratory: Positive for shortness of breath.  No wheezing or history wheezing. Gastrointestinal: Negative for abdominal pain, vomiting and diarrhea. Genitourinary: Negative for dysuria. Musculoskeletal: Negative for back pain. Skin: Negative for rash. Neurological: Negative for headache. 10 point Review of Systems otherwise negative ____________________________________________   PHYSICAL EXAM:  VITAL SIGNS: ED Triage Vitals [05/13/16 0743]  Enc Vitals Group     BP (!) 196/84     Pulse Rate 89     Resp 20     Temp 98.5 F (36.9 C)     Temp Source Oral     SpO2 95 %  Weight 229 lb 4.5 oz (104 kg)     Height 5\' 8"  (1.727 m)     Head Circumference      Peak Flow      Pain Score 8     Pain Loc      Pain Edu?      Excl. in [place]?      Constitutional: Alert and oriented. Well appearing and in no distress. HEENT   Head: Normocephalic and atraumatic.      Eyes: Conjunctivae are normal. PERRL. Normal extraocular movements.      Ears:         Nose: No congestion/rhinnorhea.   Mouth/Throat: Mucous membranes are moist.   Neck: No stridor. Cardiovascular/Chest: Normal  rate, regular rhythm.  No murmurs, rubs, or gallops. Respiratory: Normal respiratory effort without tachypnea nor retractions. Breath sounds are Somewhat decreased at both bases, without rhonchi, rales or wheezes appreciated. Gastrointestinal: Soft. No distention, no guarding, no rebound. Nontender.    Genitourinary/rectal:Deferred Musculoskeletal: Nontender with normal range of motion in all extremities. No joint effusions.  No lower extremity tenderness.  Minimal/trace lower extremity pitting edema bilaterally. Neurologic:  Normal speech and language. No gross or focal neurologic deficits are appreciated. Skin:  Skin is warm, dry and intact. No rash noted. Psychiatric: Mood and affect are normal. Speech and behavior are normal. Patient exhibits appropriate insight and judgment.  ____________________________________________   EKG I, Lisa Roca, MD, the attending physician have personally viewed and interpreted all ECGs.  90 bpm normal sinus rhythm. Narrow QRS. Normal axis. Normal ST and T-wave ____________________________________________  LABS (pertinent positives/negatives)  Labs Reviewed  CBC WITH DIFFERENTIAL/PLATELET - Abnormal; Notable for the following:       Result Value   RBC 3.14 (*)    Hemoglobin 9.5 (*)    HCT 28.2 (*)    RDW 15.9 (*)    Neutro Abs 6.9 (*)    All other components within normal limits  BASIC METABOLIC PANEL - Abnormal; Notable for the following:    Potassium 5.5 (*)    Glucose, Bld 130 (*)    BUN 48 (*)    Creatinine, Ser 10.57 (*)    GFR calc non Af Amer 5 (*)    GFR calc Af Amer 5 (*)    All other components within normal limits  TROPONIN I - Abnormal; Notable for the following:    Troponin I 0.04 (*)    All other components within normal limits  TROPONIN I - Abnormal; Notable for the following:    Troponin I 0.04 (*)    All other components within normal limits    ____________________________________________  RADIOLOGY All Xrays were  viewed by me. Imaging interpreted by Radiologist.  Chest x-ray portable:  Volume overload and pulmonary edema likely related to recently missed dialysis __________________________________________  PROCEDURES  Procedure(s) performed: None  Critical Care performed: None  ____________________________________________   ED COURSE / ASSESSMENT AND PLAN  Pertinent labs & imaging results that were available during my care of the patient were reviewed by me and considered in my medical decision making (see chart for details).   Patient describing atypical chest discomfort that seems to be position related to lying flat and associated with shortness of breath consistent with most likely clinical diagnosis pulmonary edema with a history of due for dialysis today over the weekend.  Symptoms do not sound consistent with a history of upper respiratory symptoms, chest x-ray is negative for pneumonia, and symptoms do not seem consistent with  PE.  Troponin was 0.042.  I spoke with Dr. Zollie Scale, nephrology about hospital dialysis for pulmonary edema and chest discomfort.    CONSULTATIONS:  Dr. Holley Raring, nephrology, recommends hospital admission and in-hospital dialysis. Dr. Charlean Sanfilippo, hospitalist for admission.   Patient / Family / Caregiver informed of clinical course, medical decision-making process, and agree with plan.    ___________________________________________   FINAL CLINICAL IMPRESSION(S) / ED DIAGNOSES   Final diagnoses:  Atypical chest pain  Acute pulmonary edema ([place])              Note: This dictation was prepared with Dragon dictation. Any transcriptional errors that result from this process are unintentional    Lisa Roca, MD 05/13/16 [phone]

## 2016-05-13 NOTE — Progress Notes (Signed)
HD START 

## 2016-05-13 NOTE — Progress Notes (Signed)
PRE HD INFO 

## 2016-05-14 LAB — BASIC METABOLIC PANEL
ANION GAP: 8 (ref 5–15)
BUN: 27 mg/dL — ABNORMAL HIGH (ref 6–20)
CHLORIDE: 98 mmol/L — AB (ref 101–111)
CO2: 29 mmol/L (ref 22–32)
CREATININE: 7.41 mg/dL — AB (ref 0.61–1.24)
Calcium: 8.8 mg/dL — ABNORMAL LOW (ref 8.9–10.3)
GFR calc non Af Amer: 7 mL/min — ABNORMAL LOW (ref >60)
GFR, EST AFRICAN AMERICAN: 8 mL/min — AB (ref >60)
Glucose, Bld: 234 mg/dL — ABNORMAL HIGH (ref 65–99)
POTASSIUM: 4.1 mmol/L (ref 3.5–5.1)
SODIUM: 135 mmol/L (ref 135–145)

## 2016-05-14 LAB — CBC
HEMATOCRIT: 29.8 % — AB (ref 40.0–52.0)
Hemoglobin: 10.1 g/dL — ABNORMAL LOW (ref 13.0–18.0)
MCH: 30.2 pg (ref 26.0–34.0)
MCHC: 34 g/dL (ref 32.0–36.0)
MCV: 88.9 fL (ref 80.0–100.0)
PLATELETS: 258 10*3/uL (ref 150–440)
RBC: 3.36 MIL/uL — AB (ref 4.40–5.90)
RDW: 15.7 % — ABNORMAL HIGH (ref 11.5–14.5)
WBC: 7 10*3/uL (ref 3.8–10.6)

## 2016-05-14 LAB — HEPATITIS B SURFACE ANTIBODY,QUALITATIVE: Hep B S Ab: NONREACTIVE

## 2016-05-14 LAB — GLUCOSE, CAPILLARY
GLUCOSE-CAPILLARY: 169 mg/dL — AB (ref 65–99)
GLUCOSE-CAPILLARY: 155 mg/dL — AB (ref 65–99)

## 2016-05-14 LAB — MRSA PCR SCREENING: MRSA by PCR: NEGATIVE

## 2016-05-14 LAB — HEPATITIS B SURFACE ANTIGEN: Hepatitis B Surface Ag: NEGATIVE

## 2016-05-14 LAB — TROPONIN I: Troponin I: 0.03 ng/mL

## 2016-05-14 LAB — HEMOGLOBIN A1C: Hgb A1c MFr Bld: 7.5 % — ABNORMAL HIGH (ref 4.0–6.0)

## 2016-05-14 MED ORDER — INSULIN ASPART PROT & ASPART (70-30 MIX) 100 UNIT/ML ~~LOC~~ SUSP: 18.0000 [IU] | Freq: Every day | SUBCUTANEOUS | Status: DC

## 2016-05-14 MED ORDER — HYDRALAZINE HCL 25 MG PO TABS: 25.0000 mg | ORAL_TABLET | Freq: Three times a day (TID) | ORAL | 1 refills | Status: DC

## 2016-05-14 MED ORDER — HYDRALAZINE HCL 25 MG PO TABS: 25.0000 mg | ORAL_TABLET | Freq: Three times a day (TID) | ORAL | Status: DC

## 2016-05-14 MED ORDER — INSULIN ASPART PROT & ASPART (70-30 MIX) 100 UNIT/ML ~~LOC~~ SUSP: 20.0000 [IU] | Freq: Every day | SUBCUTANEOUS | Status: DC

## 2016-05-14 NOTE — Progress Notes (Signed)
A&O. Admitted from home with shortness of breath. Dialysis TTS. Underwent dialysis before coming to floor. Currently no shortness of breath or distress of any kind. Family at side. Wound to abdomen noted, cleaned and dressed. WIll continue to monitor.

## 2016-05-14 NOTE — Discharge Summary (Signed)
[facility] [place] NAME: Danny [name]    MR#:  [id number]  DATE OF BIRTH:  1956/05/10  DATE OF ADMISSION:  05/13/2016 ADMITTING PHYSICIAN: Gladstone Lighter, MD  DATE OF DISCHARGE: 05/14/16  PRIMARY CARE PHYSICIAN: Pcp Not In System    ADMISSION DIAGNOSIS:  Acute pulmonary edema (H[facility]) [J81.0] Atypical chest pain [R07.89]  DISCHARGE DIAGNOSIS:  Acute Pulmonary edema with volume overload  ESRD on HD  SECONDARY DIAGNOSIS:   Past Medical History:  Diagnosis Date  . Diabetes mellitus without complication ([place])   . Dialysis patient [facility])    tuesday, thursday and saturday  . Gout   . Hypertension   . Kidney disease     HOSPITAL COURSE:  Danny [name]  is a 60 y.o. male with a known history of Hypertension, insulin-dependent diabetes mellitus, end-stage renal disease started on dialysis in October 2016 presents to the hospital secondary to chest pressure and shortness of breath that started last night.  #1 acute pulmonary edema-fluid retention. Denies any dietary noncompliance. -s/p HD with 3.5Liters -Nephrology consulted.  -Continue Lasix twice a day. Low sodium diet recommended. Also fluid restriction. -feels back to baseline. sats >92%  #2 hyperkalemia-secondary to renal failure. Due for dialysis today. k 4.1  #3 hypertension-not on any home medications. -will add hydralazine - IV hydralazine here as needed.   #4 diabetes mellitus-continue with victoza and home insulin. Also sliding scale insulin.  #5 DVT prophylaxis-on subcutaneous heparin  #6 anemia of chronic disease-monitor. Likely Epogen with dialysis  Overall stable. D/c home  CONSULTS OBTAINED:  Treatment Team:  Munsoor Holley Raring, MD  DRUG ALLERGIES:  No Known Allergies  DISCHARGE MEDICATIONS:   Current Discharge Medication List    START taking these medications   Details  hydrALAZINE (APRESOLINE) 25 MG tablet Take 1 tablet (25 mg total) by  mouth every 8 (eight) hours. Qty: 90 tablet, Refills: 1      CONTINUE these medications which have NOT CHANGED   Details  calcitRIOL (ROCALTROL) 0.25 MCG capsule Take 0.25 mcg by mouth 3 (three) times a week.    calcium acetate (PHOSLO) 667 MG capsule Take 1,334 mg by mouth 3 (three) times daily with meals.    citalopram (CELEXA) 20 MG tablet Take 10 mg by mouth daily.    furosemide (LASIX) 40 MG tablet Take 40 mg by mouth 2 (two) times daily.    insulin aspart protamine- aspart (NOVOLOG MIX 70/30) (70-30) 100 UNIT/ML injection Inject 18-20 Units into the skin 2 (two) times daily with a meal. 20 units in the morning and 18 units at night    Liraglutide 18 MG/3ML SOPN Inject 1.8 mg into the skin daily.     prochlorperazine (COMPAZINE) 5 MG tablet 1 tablet every 6 hours as needed for headache/nausea Qty: 20 tablet, Refills: 0        If you experience worsening of your admission symptoms, develop shortness of breath, life threatening emergency, suicidal or homicidal thoughts you must seek medical attention immediately by calling 911 or calling your MD immediately  if symptoms less severe.  You Must read complete instructions/literature along with all the possible adverse reactions/side effects for all the Medicines you take and that have been prescribed to you. Take any new Medicines after you have completely understood and accept all the possible adverse reactions/side effects.   Please note  You were cared for by a hospitalist during your hospital stay. If you have any questions about your  discharge medications or the care you received while you were in the hospital after you are discharged, you can call the unit and asked to speak with the hospitalist on call if the hospitalist that took care of you is not available. Once you are discharged, your primary care physician will handle any further medical issues. Please note that NO REFILLS for any discharge medications will be authorized  once you are discharged, as it is imperative that you return to your primary care physician (or establish a relationship with a primary care physician if you do not have one) for your aftercare needs so that they can reassess your need for medications and monitor your lab values. Today   SUBJECTIVE   Doing well. Feels back to baseline  VITAL SIGNS:  Blood pressure (!) 186/81, pulse 88, temperature 98.7 F (37.1 C), temperature source Oral, resp. rate 18, height 5\' 8"  (1.727 m), weight 222 lb 12.8 oz (101.1 kg), SpO2 97 %.  I/O:    Intake/Output Summary (Last 24 hours) at 05/14/16 1351 Last data filed at 05/14/16 1345  Gross per 24 hour  Intake              480 ml  Output             3700 ml  Net            -3220 ml    PHYSICAL EXAMINATION:  GENERAL:  60 y.o.-year-old patient lying in the bed with no acute distress.  EYES: Pupils equal, round, reactive to light and accommodation. No scleral icterus. Extraocular muscles intact.  HEENT: Head atraumatic, normocephalic. Oropharynx and nasopharynx clear.  NECK:  Supple, no jugular venous distention. No thyroid enlargement, no tenderness.  LUNGS: Normal breath sounds bilaterally, no wheezing, rales,rhonchi or crepitation. No use of accessory muscles of respiration.  CARDIOVASCULAR: S1, S2 normal. No murmurs, rubs, or gallops.  ABDOMEN: Soft, non-tender, non-distended. Bowel sounds present. No organomegaly or mass.  EXTREMITIES: No pedal edema, cyanosis, or clubbing.  NEUROLOGIC: Cranial nerves II through XII are intact. Muscle strength 5/5 in all extremities. Sensation intact. Gait not checked.  PSYCHIATRIC: The patient is alert and oriented x 3.  SKIN: No obvious rash, lesion, or ulcer.   DATA REVIEW:   CBC   Recent Labs Lab 05/14/16 0248  WBC 7.0  HGB 10.1*  HCT 29.8*  PLT 258    Chemistries   Recent Labs Lab 05/14/16 0248  NA 135  K 4.1  CL 98*  CO2 29  GLUCOSE 234*  BUN 27*  CREATININE 7.41*  CALCIUM 8.8*     Microbiology Results   Recent Results (from the past 240 hour(s))  MRSA PCR Screening     Status: None   Collection Time: 05/13/16  8:04 PM  Result Value Ref Range Status   MRSA by PCR NEGATIVE NEGATIVE Final    Comment:        The GeneXpert MRSA Assay (FDA approved for NASAL specimens only), is one component of a comprehensive MRSA colonization surveillance program. It is not intended to diagnose MRSA infection nor to guide or monitor treatment for MRSA infections.     RADIOLOGY:  Dg Chest Port 1 View  Result Date: 05/13/2016 CLINICAL DATA:  Cough and shortness of Breath EXAM: PORTABLE CHEST 1 VIEW COMPARISON:  09/10/2013 FINDINGS: Cardiac shadow is mildly enlarged. Aortic calcifications are again noted. Increased vascular congestion and interstitial edema is noted likely related to volume overload due to the patient's missed dialysis session. No  focal infiltrate is seen. No bony abnormality is noted. IMPRESSION: Volume overload and pulmonary edema likely related to recently missed dialysis. Electronically Signed   By: Inez Catalina M.D.   On: 05/13/2016 08:23     Management plans discussed with the patient, family and they are in agreement.  CODE STATUS:     Code Status Orders        Start     Ordered   05/13/16 1955  Full code  Continuous     05/13/16 1954    Code Status History    Date Active Date Inactive Code Status Order ID Comments User Context   This patient has a current code status but no historical code status.      TOTAL TIME TAKING CARE OF THIS PATIENT: 40 minutes.    Christino Mcglinchey M.D on 05/14/2016 at 1:51 PM  Between 7am to 6pm - Pager - [phone] After 6pm go to [email or web address] - password [email or web address] [facility]  Office  [phone]  CC: Primary care physician; Pcp Not In System

## 2016-05-14 NOTE — Consult Note (Signed)
[facility] CONSULT NOTE    Date: 05/14/2016                  Patient Name:  Danny [name]  MRN: [id number]  DOB: Aug 06, 1956  Age / Sex: 60 y.o., male         PCP: Pcp Not In System                 Service Requesting Consult: Hospitalist                 Reason for Consult: Management of ESRD            History of Present Illness: Patient is a 60 y.o. male with a PMHx of long-standing diabetes mellitus type 2, end-stage renal disease on hemodialysis TTS, gout, hypertension, anemia chronic kidney disease, secondary hyperparathyroidism, who was admitted to [facility] on 05/13/2016 for evaluation of chest pain, shortness of breath, and cough.  The symptoms began the night prior to admission.  He states that he woke up in the middle of the nightand developed chest pressure as well as shortness of breath.  He believes that he may have had increase fluid intake over the weekend.  He completed his dialysis treatment on Saturday.  He called the dialysis unit on Tuesday with the symptoms and advised him to come to the emergency department.   Medications: Outpatient medications: Prescriptions Prior to Admission  Medication Sig Dispense Refill Last Dose  . calcitRIOL (ROCALTROL) 0.25 MCG capsule Take 0.25 mcg by mouth 3 (three) times a week.   Past Week at Unknown time  . calcium acetate (PHOSLO) 667 MG capsule Take 1,334 mg by mouth 3 (three) times daily with meals.   05/12/2016 at Unknown time  . citalopram (CELEXA) 20 MG tablet Take 10 mg by mouth daily.   05/12/2016 at Unknown time  . furosemide (LASIX) 40 MG tablet Take 40 mg by mouth 2 (two) times daily.   05/12/2016 at Unknown time  . insulin aspart protamine- aspart (NOVOLOG MIX 70/30) (70-30) 100 UNIT/ML injection Inject 18-20 Units into the skin 2 (two) times daily with a meal. 20 units in the morning and 18 units at night   05/12/2016 at Unknown time  . Liraglutide 18 MG/3ML SOPN Inject 1.8 mg into the skin daily.     05/12/2016 at Unknown time  . prochlorperazine (COMPAZINE) 5 MG tablet 1 tablet every 6 hours as needed for headache/nausea 20 tablet 0 prn at prn    Current medications: Current Facility-Administered Medications  Medication Dose Route Frequency Provider Last Rate Last Dose  . 0.9 %  sodium chloride infusion  100 mL Intravenous PRN Dominque Levandowski, MD      . 0.9 %  sodium chloride infusion  100 mL Intravenous PRN Uchenna Seufert, MD      . acetaminophen (TYLENOL) tablet 650 mg  650 mg Oral Q6H PRN Gladstone Lighter, MD       Or  . acetaminophen (TYLENOL) suppository 650 mg  650 mg Rectal Q6H PRN Gladstone Lighter, MD      . alteplase (CATHFLO ACTIVASE) injection 2 mg  2 mg Intracatheter Once PRN Kali Deadwyler, MD      . calcitRIOL (ROCALTROL) capsule 0.25 mcg  0.25 mcg Oral Once per day on Mon Wed Fri Gladstone Lighter, MD   0.25 mcg at 05/14/16 [id number]  . calcium acetate (PHOSLO) capsule 1,334 mg  1,334 mg Oral TID WC Gladstone Lighter, MD   1,334 mg at 05/14/16  1138  . citalopram (CELEXA) tablet 10 mg  10 mg Oral Daily Gladstone Lighter, MD   10 mg at 05/14/16 [id number]  . furosemide (LASIX) tablet 40 mg  40 mg Oral BID Gladstone Lighter, MD   40 mg at 05/14/16 0833  . heparin injection 1,000 Units  1,000 Units Dialysis PRN Armistead Sult, MD      . heparin injection 5,000 Units  5,000 Units Subcutaneous Q8H Gladstone Lighter, MD   5,000 Units at 05/14/16 0558  . hydrALAZINE (APRESOLINE) injection 10 mg  10 mg Intravenous Q6H PRN Gladstone Lighter, MD   10 mg at 05/13/16 2038  . hydrALAZINE (APRESOLINE) tablet 25 mg  25 mg Oral Q8H Fritzi Mandes, MD      . insulin aspart (novoLOG) injection 0-5 Units  0-5 Units Subcutaneous QHS Gladstone Lighter, MD   3 Units at 05/13/16 2152  . insulin aspart (novoLOG) injection 0-9 Units  0-9 Units Subcutaneous TID WC Gladstone Lighter, MD   2 Units at 05/14/16 1138  . insulin aspart protamine- aspart (NOVOLOG MIX 70/30) injection 18 Units  18 Units Subcutaneous Q  supper Gladstone Lighter, MD      . Derrill Memo ON 05/15/2016] insulin aspart protamine- aspart (NOVOLOG MIX 70/30) injection 20 Units  20 Units Subcutaneous Q breakfast Gladstone Lighter, MD      . lidocaine (PF) (XYLOCAINE) 1 % injection 5 mL  5 mL Intradermal PRN Undrea Shipes, MD      . lidocaine-prilocaine (EMLA) cream 1 application  1 application Topical PRN Harris Penton, MD      . ondansetron (ZOFRAN) tablet 4 mg  4 mg Oral Q6H PRN Gladstone Lighter, MD       Or  . ondansetron (ZOFRAN) injection 4 mg  4 mg Intravenous Q6H PRN Gladstone Lighter, MD      . pentafluoroprop-tetrafluoroeth (GEBAUERS) aerosol 1 application  1 application Topical PRN Casandra Dallaire, MD      . sodium chloride flush (NS) 0.9 % injection 3 mL  3 mL Intravenous Q12H Gladstone Lighter, MD   3 mL at 05/14/16 1000      Allergies: No Known Allergies    Past Medical History: Past Medical History:  Diagnosis Date  . Diabetes mellitus without complication ([place])   . Dialysis patient [facility])    tuesday, thursday and saturday  . Gout   . Hypertension   . Kidney disease      Past Surgical History: Past Surgical History:  Procedure Laterality Date  . AV FISTULA PLACEMENT    . miscellaneous     peritoneal dialysis catheter placement and removal     Family History: Family History  Problem Relation Age of Onset  . Diabetes Mother   . Prostate cancer Father   . Kidney disease Sister      Social History: Social History   Social History  . Marital status: Married    Spouse name: N/A  . Number of children: N/A  . Years of education: N/A   Occupational History  . Not on file.   Social History Main Topics  . Smoking status: Never Smoker  . Smokeless tobacco: Never Used  . Alcohol use No  . Drug use: No  . Sexual activity: Not on file   Other Topics Concern  . Not on file   Social History Narrative   Independent at baseline     Review of Systems: Review of Systems  Constitutional: Positive  for malaise/fatigue. Negative for chills, fever and weight loss.  HENT: Negative  for hearing loss and tinnitus.   Eyes: Negative for blurred vision and double vision.  Respiratory: Positive for cough and shortness of breath.   Cardiovascular: Positive for chest pain, orthopnea and PND.  Gastrointestinal: Negative for heartburn, nausea and vomiting.  Genitourinary: Negative for dysuria, frequency and urgency.  Musculoskeletal: Negative for myalgias and neck pain.  Skin: Positive for rash. Negative for itching.  Neurological: Positive for focal weakness. Negative for dizziness and headaches.  Endo/Heme/Allergies: Negative for polydipsia. Does not bruise/bleed easily.  Psychiatric/Behavioral: Negative for depression.     Vital Signs: Blood pressure (!) 186/81, pulse 88, temperature 98.7 F (37.1 C), temperature source Oral, resp. rate 18, height 5\' 8"  (1.727 m), weight 101.1 kg (222 lb 12.8 oz), SpO2 97 %.  Weight trends: Filed Weights   05/13/16 1510 05/13/16 1845 05/13/16 1942  Weight: 106.2 kg (234 lb 2.1 oz) 102 kg (224 lb 13.9 oz) 101.1 kg (222 lb 12.8 oz)    Physical Exam: General: NAD,   Head: Normocephalic, atraumatic.  Eyes: Anicteric, EOMI  Nose: Mucous membranes moist, not inflammed, nonerythematous.  Throat: Oropharynx nonerythematous, no exudate appreciated.   Neck: Supple, trachea midline.  Lungs:  Normal respiratory effort. Clear to auscultation BL without crackles or wheezes.  Heart: RRR. S1 and S2 normal without gallop, murmur, or rubs.  Abdomen:  BS normoactive. Soft, Nondistended, non-tender.  No masses or organomegaly.  Extremities: No pretibial edema.  Neurologic: A&O X3, Motor strength is 5/5 in the all 4 extremities  Skin: No visible rashes, scars.    Lab results: Basic Metabolic Panel:  Recent Labs Lab 05/13/16 0840 05/13/16 2044 05/14/16 0248  NA 137 137 135  K 5.5* 4.4 4.1  CL 101 95* 98*  CO2 24 27 29   GLUCOSE 130* 282* 234*  BUN 48* 21*  27*  CREATININE 10.57* 6.22*  6.34* 7.41*  CALCIUM 8.9 8.9 8.8*  PHOS  --  3.8  --     Liver Function Tests:  Recent Labs Lab 05/13/16 2044  ALBUMIN 3.7   No results for input(s): LIPASE, AMYLASE in the last 168 hours. No results for input(s): AMMONIA in the last 168 hours.  CBC:  Recent Labs Lab 05/13/16 0840 05/13/16 2044 05/14/16 0248  WBC 9.1 7.8 7.0  NEUTROABS 6.9*  --   --   HGB 9.5* 10.5* 10.1*  HCT 28.2* 31.3* 29.8*  MCV 89.7 89.1 88.9  PLT 258 265 258    Cardiac Enzymes:  Recent Labs Lab 05/13/16 0840 05/13/16 1223 05/13/16 1437 05/13/16 2044 05/14/16 0248  TROPONINI 0.04* 0.04* 0.03* 0.03* 0.03*    BNP: Invalid input(s): POCBNP  CBG:  Recent Labs Lab 05/13/16 2111 05/14/16 0718 05/14/16 1109  GLUCAP 323* 169* 155*    Microbiology: Results for orders placed or performed during the hospital encounter of 05/13/16  MRSA PCR Screening     Status: None   Collection Time: 05/13/16  8:04 PM  Result Value Ref Range Status   MRSA by PCR NEGATIVE NEGATIVE Final    Comment:        The GeneXpert MRSA Assay (FDA approved for NASAL specimens only), is one component of a comprehensive MRSA colonization surveillance program. It is not intended to diagnose MRSA infection nor to guide or monitor treatment for MRSA infections.     Coagulation Studies: No results for input(s): LABPROT, INR in the last 72 hours.  Urinalysis: No results for input(s): COLORURINE, LABSPEC, PHURINE, GLUCOSEU, HGBUR, BILIRUBINUR, KETONESUR, PROTEINUR, UROBILINOGEN, NITRITE, LEUKOCYTESUR in the last 72  hours.  Invalid input(s): APPERANCEUR    Imaging: Dg Chest Port 1 View  Result Date: 05/13/2016 CLINICAL DATA:  Cough and shortness of Breath EXAM: PORTABLE CHEST 1 VIEW COMPARISON:  09/10/2013 FINDINGS: Cardiac shadow is mildly enlarged. Aortic calcifications are again noted. Increased vascular congestion and interstitial edema is noted likely related to volume  overload due to the patient's missed dialysis session. No focal infiltrate is seen. No bony abnormality is noted. IMPRESSION: Volume overload and pulmonary edema likely related to recently missed dialysis. Electronically Signed   By: Inez Catalina M.D.   On: 05/13/2016 08:23      Assessment & Plan: Pt is a 61 y.o. male  with a PMHx of long-standing diabetes mellitus type 2, end-stage renal disease on hemodialysis TTS, gout, hypertension, anemia chronic kidney disease, secondary hyperparathyroidism, who was admitted to [facility] on 05/13/2016 for evaluation of chest pain, shortness of breath, and cough.   [address]/TTS  1.  End-stage renal disease on hemodialysis TTS:  Patient had hemodialysis yesterday.  Volume overload and pulmonary edema were noted.  Clinically the patient is feeling much better today.  No urgent indication for another dialysis treatment today.  We counseled the patient on minimizing intradialytic weight gain.  He will plan to have dialysis performed as an outpatient tomorrow.  2. Pulmonary edema.  Secondaryto increase fluid intake over the weekend most likely as well as hypertension.  Patient underwent hemodialysis yesterday.  I advised the patient to undergo dialysis tomorrow as an outpatient for additional ultrafiltration.  3.secondary hyperparathyroidism.  Phosphorus was 3.8 yesterday and acceptable.  Continue to monitor as an outpatient and continue on calcium acetate at its current dosage.  4.  Anemia of chronic kidney disease.  Hemoglobin currently 10.1.  He may continue erythropoietin stimulating agents as an outpatient.  5.  Thanks for consultation.

## 2016-05-14 NOTE — Discharge Instructions (Signed)
F/u your Nephrology MD as before Resume your HD Tue-thur-sat

## 2016-05-14 NOTE — Consult Note (Signed)
  [facility] Nurse wound consult note Reason for Consult: LLQ Abdominal wound from peritoneal dialysis site. Healing.  Wound type: Surgical site.  Pressure Ulcer POA: N/A Measurement:1 cm x 1 cm x 0.2 cm  Wound [id number] Drainage (amount, consistency, odor) Minimal serosanguinous  No odor.  Periwound:Intact Dressing procedure/placement/frequency:Cleanse wound to left lower abdomen with NS and pat gently dry.  Apply calcium alginate to wound bed.  Cover with 4x4 gauze and tape.  Change daily.  Will not follow at this time.  Please re-consult if needed.  Domenic Moras RN BSN [place] Pager [phone]

## 2016-05-14 NOTE — Progress Notes (Signed)
Patient discharged via wheelchair and private vehicle. IV removed and catheter intact. All discharge instructions given and patient verbalizes understanding. Tele removed and returned. No prescriptions given to patient No distress noted. Discharge done with an interpreter Danny [name].

## 2016-10-02 ENCOUNTER — Inpatient Hospital Stay
Admission: EM | Admit: 2016-10-02 | Discharge: 2016-10-04 | DRG: 640 | Disposition: A | Discharge status: Home / Self Care | Payer: Self-pay | Attending: Internal Medicine | Admitting: Internal Medicine

## 2016-10-02 DIAGNOSIS — N186 End stage renal disease: Secondary | ICD-10-CM

## 2016-10-02 DIAGNOSIS — D696 Thrombocytopenia, unspecified: Secondary | ICD-10-CM

## 2016-10-02 DIAGNOSIS — I120 Hypertensive chronic kidney disease with stage 5 chronic kidney disease or end stage renal disease: Secondary | ICD-10-CM | POA: Diagnosis present

## 2016-10-02 DIAGNOSIS — Z8042 Family history of malignant neoplasm of prostate: Secondary | ICD-10-CM

## 2016-10-02 DIAGNOSIS — R197 Diarrhea, unspecified: Secondary | ICD-10-CM

## 2016-10-02 DIAGNOSIS — K625 Hemorrhage of anus and rectum: Secondary | ICD-10-CM

## 2016-10-02 DIAGNOSIS — Z833 Family history of diabetes mellitus: Secondary | ICD-10-CM

## 2016-10-02 DIAGNOSIS — D631 Anemia in chronic kidney disease: Secondary | ICD-10-CM | POA: Diagnosis present

## 2016-10-02 DIAGNOSIS — E875 Hyperkalemia: Principal | ICD-10-CM

## 2016-10-02 DIAGNOSIS — Z841 Family history of disorders of kidney and ureter: Secondary | ICD-10-CM

## 2016-10-02 DIAGNOSIS — A0839 Other viral enteritis: Secondary | ICD-10-CM | POA: Diagnosis present

## 2016-10-02 DIAGNOSIS — Z9115 Patient's noncompliance with renal dialysis: Secondary | ICD-10-CM

## 2016-10-02 DIAGNOSIS — E1122 Type 2 diabetes mellitus with diabetic chronic kidney disease: Secondary | ICD-10-CM | POA: Diagnosis present

## 2016-10-02 DIAGNOSIS — Z992 Dependence on renal dialysis: Secondary | ICD-10-CM

## 2016-10-02 DIAGNOSIS — A084 Viral intestinal infection, unspecified: Secondary | ICD-10-CM

## 2016-10-02 DIAGNOSIS — M109 Gout, unspecified: Secondary | ICD-10-CM | POA: Diagnosis present

## 2016-10-02 DIAGNOSIS — D649 Anemia, unspecified: Secondary | ICD-10-CM

## 2016-10-02 DIAGNOSIS — N2581 Secondary hyperparathyroidism of renal origin: Secondary | ICD-10-CM | POA: Diagnosis present

## 2016-10-02 LAB — COMPREHENSIVE METABOLIC PANEL
ALBUMIN: 3.4 g/dL — AB (ref 3.5–5.0)
ALT: 38 U/L (ref 17–63)
AST: 29 U/L (ref 15–41)
Alkaline Phosphatase: 127 U/L — ABNORMAL HIGH (ref 38–126)
Anion gap: 12 (ref 5–15)
BUN: 86 mg/dL — AB (ref 6–20)
CHLORIDE: 99 mmol/L — AB (ref 101–111)
CO2: 24 mmol/L (ref 22–32)
Calcium: 8.3 mg/dL — ABNORMAL LOW (ref 8.9–10.3)
Creatinine, Ser: 11.63 mg/dL — ABNORMAL HIGH (ref 0.61–1.24)
GFR calc Af Amer: 5 mL/min — ABNORMAL LOW (ref >60)
GFR calc non Af Amer: 4 mL/min — ABNORMAL LOW (ref >60)
GLUCOSE: 107 mg/dL — AB (ref 65–99)
Potassium: 6.5 mmol/L (ref 3.5–5.1)
Sodium: 135 mmol/L (ref 135–145)
Total Bilirubin: 0.5 mg/dL (ref 0.3–1.2)
Total Protein: 6.8 g/dL (ref 6.5–8.1)

## 2016-10-02 LAB — GASTROINTESTINAL PANEL BY PCR, STOOL (REPLACES STOOL CULTURE)
Adenovirus F40/41: NOT DETECTED
Astrovirus: NOT DETECTED
CRYPTOSPORIDIUM: NOT DETECTED
Campylobacter species: NOT DETECTED
Cyclospora cayetanensis: NOT DETECTED
ENTAMOEBA HISTOLYTICA: NOT DETECTED
ENTEROAGGREGATIVE E COLI (EAEC): NOT DETECTED
Enteropathogenic E coli (EPEC): NOT DETECTED
Enterotoxigenic E coli (ETEC): NOT DETECTED
GIARDIA LAMBLIA: NOT DETECTED
Norovirus GI/GII: NOT DETECTED
Plesimonas shigelloides: NOT DETECTED
ROTAVIRUS A: NOT DETECTED
SALMONELLA SPECIES: NOT DETECTED
SHIGELLA/ENTEROINVASIVE E COLI (EIEC): NOT DETECTED
Sapovirus (I, II, IV, and V): DETECTED — AB
Shiga like toxin producing E coli (STEC): NOT DETECTED
Vibrio cholerae: NOT DETECTED
Vibrio species: NOT DETECTED
YERSINIA ENTEROCOLITICA: NOT DETECTED

## 2016-10-02 LAB — CBC WITH DIFFERENTIAL/PLATELET
BASOS PCT: 1 %
Basophils Absolute: 0 10*3/uL (ref 0–0.1)
EOS PCT: 4 %
Eosinophils Absolute: 0.2 10*3/uL (ref 0–0.7)
HCT: 32.3 % — ABNORMAL LOW (ref 40.0–52.0)
Hemoglobin: 10.9 g/dL — ABNORMAL LOW (ref 13.0–18.0)
Lymphocytes Relative: 22 %
Lymphs Abs: 1 10*3/uL (ref 1.0–3.6)
MCH: 28.6 pg (ref 26.0–34.0)
MCHC: 33.8 g/dL (ref 32.0–36.0)
MCV: 84.7 fL (ref 80.0–100.0)
MONO ABS: 0.7 10*3/uL (ref 0.2–1.0)
Monocytes Relative: 15 %
NEUTROS ABS: 2.7 10*3/uL (ref 1.4–6.5)
Neutrophils Relative %: 58 %
PLATELETS: 124 10*3/uL — AB (ref 150–440)
RBC: 3.81 MIL/uL — ABNORMAL LOW (ref 4.40–5.90)
RDW: 17.9 % — AB (ref 11.5–14.5)
WBC: 4.7 10*3/uL (ref 3.8–10.6)

## 2016-10-02 LAB — GLUCOSE, CAPILLARY
Glucose-Capillary: 83 mg/dL (ref 65–99)
Glucose-Capillary: 164 mg/dL — ABNORMAL HIGH (ref 65–99)

## 2016-10-02 LAB — C DIFFICILE QUICK SCREEN W PCR REFLEX
C DIFFICILE (CDIFF) TOXIN: NEGATIVE
C Diff antigen: NEGATIVE
C Diff interpretation: NOT DETECTED

## 2016-10-02 LAB — CREATININE, SERUM
Creatinine, Ser: 11.95 mg/dL — ABNORMAL HIGH (ref 0.61–1.24)
GFR calc non Af Amer: 4 mL/min — ABNORMAL LOW (ref >60)
GFR, EST AFRICAN AMERICAN: 5 mL/min — AB (ref >60)

## 2016-10-02 LAB — LIPASE, BLOOD: Lipase: 58 U/L — ABNORMAL HIGH (ref 11–51)

## 2016-10-02 MED ORDER — SODIUM BICARBONATE 8.4 % IV SOLN
50.0000 meq | Freq: Once | INTRAVENOUS | Status: AC
  Administered 2016-10-02: 50 meq via INTRAVENOUS
  Filled 2016-10-02: qty 50

## 2016-10-02 MED ORDER — FUROSEMIDE 40 MG PO TABS
80.0000 mg | ORAL_TABLET | Freq: Two times a day (BID) | ORAL | Status: DC
  Administered 2016-10-02 – 2016-10-04 (×4): 80 mg via ORAL
  Filled 2016-10-02 (×4): qty 2

## 2016-10-02 MED ORDER — SODIUM CHLORIDE 0.9 % IV SOLN: Freq: Once | INTRAVENOUS | Status: DC

## 2016-10-02 MED ORDER — DEXTROSE 50 % IV SOLN
25.0000 g | Freq: Once | INTRAVENOUS | Status: AC
  Administered 2016-10-02: 25 g via INTRAVENOUS
  Filled 2016-10-02: qty 50

## 2016-10-02 MED ORDER — FUROSEMIDE 40 MG PO TABS: 40.0000 mg | ORAL_TABLET | Freq: Two times a day (BID) | ORAL | Status: DC

## 2016-10-02 MED ORDER — CALCIUM ACETATE (PHOS BINDER) 667 MG PO CAPS
1334.0000 mg | ORAL_CAPSULE | Freq: Three times a day (TID) | ORAL | Status: DC
  Administered 2016-10-02 – 2016-10-04 (×6): 1334 mg via ORAL
  Filled 2016-10-02 (×6): qty 2

## 2016-10-02 MED ORDER — DOCUSATE SODIUM 100 MG PO CAPS: 100.0000 mg | ORAL_CAPSULE | Freq: Two times a day (BID) | ORAL | Status: DC

## 2016-10-02 MED ORDER — INSULIN ASPART 100 UNIT/ML ~~LOC~~ SOLN
0.0000 [IU] | Freq: Three times a day (TID) | SUBCUTANEOUS | Status: DC
  Administered 2016-10-03: 3 [IU] via SUBCUTANEOUS
  Administered 2016-10-03 – 2016-10-04 (×2): 2 [IU] via SUBCUTANEOUS
  Filled 2016-10-02: qty 3
  Filled 2016-10-02 (×2): qty 2

## 2016-10-02 MED ORDER — ACETAMINOPHEN 650 MG RE SUPP: 650.0000 mg | Freq: Four times a day (QID) | RECTAL | Status: DC | PRN

## 2016-10-02 MED ORDER — ONDANSETRON HCL 4 MG PO TABS: 4.0000 mg | ORAL_TABLET | Freq: Four times a day (QID) | ORAL | Status: DC | PRN

## 2016-10-02 MED ORDER — EPOETIN ALFA 4000 UNIT/ML IJ SOLN
4000.0000 [IU] | INTRAMUSCULAR | Status: DC
  Administered 2016-10-02: 4000 [IU] via INTRAVENOUS
  Filled 2016-10-02: qty 1

## 2016-10-02 MED ORDER — ONDANSETRON HCL 4 MG/2ML IJ SOLN: 4.0000 mg | Freq: Four times a day (QID) | INTRAMUSCULAR | Status: DC | PRN

## 2016-10-02 MED ORDER — HEPARIN SODIUM (PORCINE) 5000 UNIT/ML IJ SOLN
5000.0000 [IU] | Freq: Three times a day (TID) | INTRAMUSCULAR | Status: DC
  Administered 2016-10-02 – 2016-10-04 (×5): 5000 [IU] via SUBCUTANEOUS
  Filled 2016-10-02 (×5): qty 1

## 2016-10-02 MED ORDER — CITALOPRAM HYDROBROMIDE 20 MG PO TABS
10.0000 mg | ORAL_TABLET | Freq: Every day | ORAL | Status: DC
  Administered 2016-10-02 – 2016-10-04 (×3): 10 mg via ORAL
  Filled 2016-10-02 (×4): qty 1

## 2016-10-02 MED ORDER — INSULIN ASPART 100 UNIT/ML ~~LOC~~ SOLN
10.0000 [IU] | Freq: Once | SUBCUTANEOUS | Status: AC
  Administered 2016-10-02: 10 [IU] via INTRAVENOUS
  Filled 2016-10-02: qty 10

## 2016-10-02 MED ORDER — ACETAMINOPHEN 325 MG PO TABS: 650.0000 mg | ORAL_TABLET | Freq: Four times a day (QID) | ORAL | Status: DC | PRN

## 2016-10-02 MED ORDER — CALCITRIOL 0.25 MCG PO CAPS
0.2500 ug | ORAL_CAPSULE | ORAL | Status: DC
  Administered 2016-10-03: 0.25 ug via ORAL
  Filled 2016-10-02: qty 1

## 2016-10-02 MED ORDER — HYDRALAZINE HCL 25 MG PO TABS
25.0000 mg | ORAL_TABLET | Freq: Three times a day (TID) | ORAL | Status: DC
  Administered 2016-10-02 – 2016-10-04 (×6): 25 mg via ORAL
  Filled 2016-10-02 (×6): qty 1

## 2016-10-02 MED ORDER — SODIUM CHLORIDE 0.9 % IV SOLN
1.0000 g | Freq: Once | INTRAVENOUS | Status: AC
  Administered 2016-10-02: 1 g via INTRAVENOUS
  Filled 2016-10-02: qty 10

## 2016-10-02 NOTE — ED Notes (Signed)
This RN to bedside, collected stool specimen at this time. Pt visualized in NAD. Pt sitting in bed watching TV with his wife at bedside. Will continue to monitor for further patient needs.

## 2016-10-02 NOTE — ED Triage Notes (Signed)
Pt reports generalized abd pain and diarrhea for past 3 days. Pt missed dialysis yesterday last visit was Sunday. Denies nausea vomiting or fever

## 2016-10-02 NOTE — ED Provider Notes (Addendum)
[facility] Emergency Department Provider Note        Time seen: ----------------------------------------- 9:47 AM on 10/02/2016 -----------------------------------------    I have reviewed the triage vital signs and the nursing notes.   HISTORY  Chief Complaint Abdominal Pain and Diarrhea    HPI Danny [name] is a 61 y.o. male who presents to ER for generalized abdominal pain and diarrhea for the past 3 days. Patient missed dialysis yesterday due to not feeling well. His last dialysis was Sunday. He denies fevers, chills, chest pain, shortness of breath, nausea or vomiting. Patient has not had a history of this before.   Past Medical History:  Diagnosis Date  . Diabetes mellitus without complication ([place])   . Dialysis patient [facility])    tuesday, thursday and saturday  . Gout   . Hypertension   . Kidney disease     Patient Active Problem List   Diagnosis Date Noted  . Pulmonary edema 05/13/2016    Past Surgical History:  Procedure Laterality Date  . AV FISTULA PLACEMENT    . miscellaneous     peritoneal dialysis catheter placement and removal    Allergies Patient has no known allergies.  Social History Social History  Substance Use Topics  . Smoking status: Never Smoker  . Smokeless tobacco: Never Used  . Alcohol use No    Review of Systems Constitutional: Negative for fever. Cardiovascular: Negative for chest pain. Respiratory: Negative for shortness of breath. Gastrointestinal: Negative for abdominal pain, Positive for diarrhea Genitourinary: Negative for dysuria. Musculoskeletal: Negative for back pain. Skin: Negative for rash. Neurological: Negative for headaches, focal weakness or numbness.  10-point ROS otherwise negative.  ____________________________________________   PHYSICAL EXAM:  VITAL SIGNS: ED Triage Vitals  Enc Vitals Group     BP 10/02/16 0927 (!) 159/79     Pulse Rate 10/02/16 0927 80     Resp  10/02/16 0927 20     Temp 10/02/16 0927 97.8 F (36.6 C)     Temp Source 10/02/16 0927 Oral     SpO2 10/02/16 0927 99 %     Weight 10/02/16 0927 231 lb 7.7 oz (105 kg)     Height 10/02/16 0927 5\' 9"  (1.753 m)     Head Circumference --      Peak Flow --      Pain Score 10/02/16 0928 6     Pain Loc --      Pain Edu? --      Excl. in [place]? --     Constitutional: Alert and oriented. Well appearing and in no distress. Eyes: Conjunctivae are normal. PERRL. Normal extraocular movements. ENT   Head: Normocephalic and atraumatic.   Nose: No congestion/rhinnorhea.   Mouth/Throat: Mucous membranes are moist.   Neck: No stridor. Cardiovascular: Normal rate, regular rhythm. No murmurs, rubs, or gallops. Respiratory: Normal respiratory effort without tachypnea nor retractions. Breath sounds are clear and equal bilaterally. No wheezes/rales/rhonchi. Gastrointestinal: Soft and nontender. Normal bowel sounds Musculoskeletal: Nontender with normal range of motion in all extremities. No lower extremity tenderness nor edema. Neurologic:  Normal speech and language. No gross focal neurologic deficits are appreciated.  Skin:  Skin is warm, dry and intact. No rash noted. Psychiatric: Mood and affect are normal. Speech and behavior are normal.  ____________________________________________  EKG: Interpreted by me. Sinus rhythm with a rate of 75 bpm, normal PR interval, normal QRS, normal QT, normal axis.  ____________________________________________  ED COURSE:  Pertinent labs & imaging results that  were available during my care of the patient were reviewed by me and considered in my medical decision making (see chart for details). Clinical Course   Patient presents to the ER in no distress but with persistent diarrhea. Patient has also missed dialysis due to the diarrhea and not feeling well.  Procedures ____________________________________________   LABS (pertinent  positives/negatives)  Labs Reviewed  CBC WITH DIFFERENTIAL/PLATELET - Abnormal; Notable for the following:       Result Value   RBC 3.81 (*)    Hemoglobin 10.9 (*)    HCT 32.3 (*)    RDW 17.9 (*)    Platelets 124 (*)    All other components within normal limits  COMPREHENSIVE METABOLIC PANEL - Abnormal; Notable for the following:    Potassium 6.5 (*)    Chloride 99 (*)    Glucose, Bld 107 (*)    BUN 86 (*)    Creatinine, Ser 11.63 (*)    Calcium 8.3 (*)    Albumin 3.4 (*)    Alkaline Phosphatase 127 (*)    GFR calc non Af Amer 4 (*)    GFR calc Af Amer 5 (*)    All other components within normal limits  LIPASE, BLOOD - Abnormal; Notable for the following:    Lipase 58 (*)    All other components within normal limits  GASTROINTESTINAL PANEL BY PCR, STOOL (REPLACES STOOL CULTURE)  C DIFFICILE QUICK SCREEN W PCR REFLEX  URINALYSIS, COMPLETE (UACMP) WITH MICROSCOPIC   ____________________________________________  FINAL ASSESSMENT AND PLAN  Diarrhea, Hyperkalemia, end-stage renal disease  Plan: Patient with labs as dictated above. Patient presented to the ER for persistent diarrhea. He has not had any further diarrheal episodes here, was found to be hyperkalemic which is remarkable given the amount of diarrhea he describes. He'll be started on hyperkalemia treatment medications including insulin, D50, sodium bicarbonate and calcium. I will discuss with nephrology.   Earleen Newport, MD   Note: This dictation was prepared with Dragon dictation. Any transcriptional errors that result from this process are unintentional    Earleen Newport, MD 10/02/16 [place], MD 10/02/16 1055

## 2016-10-02 NOTE — Progress Notes (Signed)
HD initiated without issue via L AVF. Pt currently has no complaints. Resting in recliner watching tv

## 2016-10-02 NOTE — Progress Notes (Signed)
HD completed without issue. UF 4L. Patient tolerated well.

## 2016-10-02 NOTE — Progress Notes (Signed)
Post HD assessment unchanged  

## 2016-10-02 NOTE — Progress Notes (Signed)
Pre HD  

## 2016-10-02 NOTE — Progress Notes (Signed)
Pre Dialysis assessment. Pt in no distress.

## 2016-10-02 NOTE — Progress Notes (Signed)
Patient needed an Advance Directives in [place] language. Chaplain provided the document and education to the patient and his relative who was in the room.

## 2016-10-02 NOTE — H&P (Signed)
[facility] [place] NAME: Danny [name]    MR#:  [id number]  DATE OF BIRTH:  [birth year]  DATE OF ADMISSION:  10/02/2016  PRIMARY CARE PHYSICIAN: Pcp Not In System   REQUESTING/REFERRING PHYSICIAN: Dr.Jonathan Williams  CHIEF COMPLAINT: Diarrhea.    Chief Complaint  Patient presents with  . Abdominal Pain  . Diarrhea    HISTORY OF PRESENT ILLNESS:  Danny [name]  is a 61 y.o. male with a known history of Started on hemodialysis Monday, Wednesday, Friday comes in with the diarrhea for the past 4 days associated with abdominal cramps. Patient having  Green  stools multiple times for the past 4 days associated with abdominal cramps. No nausea. Patient was dialysis was on Sunday, December 31/2017, unable to go for dialysis yesterday because of diarrhea. No weight gain but performed as severe hyperkalemia with potassium of more than 6 without any EKG changes. Patient denies any shortness of breath.  PAST MEDICAL HISTORY:   Past Medical History:  Diagnosis Date  . Diabetes mellitus without complication ([place])   . Dialysis patient [facility])    tuesday, thursday and saturday  . Gout   . Hypertension   . Kidney disease     PAST SURGICAL HISTOIRY:   Past Surgical History:  Procedure Laterality Date  . AV FISTULA PLACEMENT    . miscellaneous     peritoneal dialysis catheter placement and removal    SOCIAL HISTORY:   Social History  Substance Use Topics  . Smoking status: Never Smoker  . Smokeless tobacco: Never Used  . Alcohol use No    FAMILY HISTORY:   Family History  Problem Relation Age of Onset  . Diabetes Mother   . Prostate cancer Father   . Kidney disease Sister     DRUG ALLERGIES:  No Known Allergies  REVIEW OF SYSTEMS:  CONSTITUTIONAL: No fever, fatigue or weakness.  EYES: No blurred or double vision.  EARS, NOSE, AND THROAT: No tinnitus or ear pain.  RESPIRATORY: No cough, shortness of breath, wheezing or  hemoptysis.  CARDIOVASCULAR: No chest pain, orthopnea, edema.  GASTROINTESTINAL: Abdominal cramps, diarrhea. No nausea. Poor by mouth intake for the last 2 days.  GENITOURINARY: No dysuria, hematuria.  ENDOCRINE: No polyuria, nocturia,  HEMATOLOGY: No anemia, easy bruising or bleeding SKIN: No rash or lesion. MUSCULOSKELETAL: No joint pain or arthritis.   NEUROLOGIC: No tingling, numbness, weakness.  PSYCHIATRY: No anxiety or depression.   MEDICATIONS AT HOME:   Prior to Admission medications   Medication Sig Start Date End Date Taking? Authorizing Provider  calcitRIOL (ROCALTROL) 0.25 MCG capsule Take 0.25 mcg by mouth 3 (three) times a week.   Yes Historical Provider, MD  calcium acetate (PHOSLO) 667 MG capsule Take 1,334 mg by mouth 3 (three) times daily with meals.   Yes Historical Provider, MD  citalopram (CELEXA) 20 MG tablet Take 10 mg by mouth daily.   Yes Historical Provider, MD  furosemide (LASIX) 40 MG tablet Take 40 mg by mouth 2 (two) times daily.   Yes Historical Provider, MD  insulin aspart protamine- aspart (NOVOLOG MIX 70/30) (70-30) 100 UNIT/ML injection Inject 18 Units into the skin 2 (two) times daily with a meal. 20 units in the morning and 18 units at night    Yes Historical Provider, MD  Liraglutide 18 MG/3ML SOPN Inject 1.8 mg into the skin daily.    Yes Historical Provider, MD  hydrALAZINE (APRESOLINE) 25 MG tablet Take  1 tablet (25 mg total) by mouth every 8 (eight) hours. Patient not taking: Reported on 10/02/2016 05/14/16   Fritzi Mandes, MD  prochlorperazine (COMPAZINE) 5 MG tablet 1 tablet every 6 hours as needed for headache/nausea Patient not taking: Reported on 10/02/2016 02/20/16   Paulette Blanch, MD      VITAL SIGNS:  Blood pressure (!) 142/62, pulse 74, temperature 97.8 F (36.6 C), temperature source Oral, resp. rate 15, height 5\' 9"  (1.753 m), weight 105 kg (231 lb 7.7 oz), SpO2 98 %.  PHYSICAL EXAMINATION:  GENERAL:  61 y.o.-year-old patient lying in the  bed with no acute distress.  EYES: Pupils equal, round, reactive to light and accommodation. No scleral icterus. Extraocular muscles intact.  HEENT: Head atraumatic, normocephalic. Oropharynx and nasopharynx clear.  NECK:  Supple, no jugular venous distention. No thyroid enlargement, no tenderness.  LUNGS: Normal breath sounds bilaterally, no wheezing, rales,rhonchi or crepitation. No use of accessory muscles of respiration.  CARDIOVASCULAR: S1, S2 normal. No murmurs, rubs, or gallops.  ABDOMEN: Soft, nontender, nondistended. Bowel sounds present. No organomegaly or mass.  EXTREMITIES: No pedal edema, cyanosis, or clubbing.  NEUROLOGIC: Cranial nerves II through XII are intact. Muscle strength 5/5 in all extremities. Sensation intact. Gait not checked.  PSYCHIATRIC: The patient is alert and oriented x 3.  SKIN: No obvious rash, lesion, or ulcer.   LABORATORY PANEL:   CBC  Recent Labs Lab 10/02/16 0955  WBC 4.7  HGB 10.9*  HCT 32.3*  PLT 124*   ------------------------------------------------------------------------------------------------------------------  Chemistries   Recent Labs Lab 10/02/16 0955  NA 135  K 6.5*  CL 99*  CO2 24  GLUCOSE 107*  BUN 86*  CREATININE 11.63*  CALCIUM 8.3*  AST 29  ALT 38  ALKPHOS 127*  BILITOT 0.5   ------------------------------------------------------------------------------------------------------------------  Cardiac Enzymes No results for input(s): TROPONINI in the last 168 hours. ------------------------------------------------------------------------------------------------------------------  RADIOLOGY:  No results found.  EKG:   Orders placed or performed during the hospital encounter of 10/02/16  . EKG 12-Lead  . EKG 12-Lead  Normal sinus rhythm, no ST-T changes. Her peak T waves.  IMPRESSION AND PLAN:   #61.61 year old the male patient with the acute diarrhea likely secondary to bilateral gastroenteritis but unable  to rule out C. difficile colitis: Patient to stool cultures, C. difficile are ordered, continue enteric precautions  #2. severe hyperkalemia secondary to missed hemodialysis sessions without EKG changes. Patient received calcium gluconate, bicarbonate, D50, insulin  In emergency room, spoke with nephrology, and arranged for emergency hemodialysis because of severe hyperkalemia,  Rpt potassium check after the hemodialysis.  3. Diabetes mellitus type 2: Patient insulin 70/30 on hold because of diarrhea and poor by mouth intake: Continue sliding scale with coverage, lesion home dose insulin after the diarrhea workup is negative and diarrhea subsides.  4. essential hypertension: Controlled, continue home medications  All the records are reviewed and case discussed with ED provider. Management plans discussed with the patient, family and they are in agreement.  CODE STATUS: full  TOTAL TIME TAKING CARE OF THIS PATIENT: 60 minutes.    Epifanio Lesches M.D on 10/02/2016 at 12:54 PM  Between 7am to 6pm - Pager - [phone]  After 6pm go to [email or web address] - password [email or web address] [facility]  Office  [phone]  CC: Primary care physician; Pcp Not In System  Note: This dictation was prepared with Dragon dictation along with smaller phrase technology. Any transcriptional errors that result from this process are unintentional.

## 2016-10-02 NOTE — Progress Notes (Signed)
Subjective:   Patient known to our practice from previous admission in August 2017. He presents this time for new onset of loose stools for the past 4 days. He missed his dialysis because of that. His last treatment was on Sunday which is 4 days ago. Today he presents to the ER and is found to have high potassium. Nephrology has been consulted to arrange for urgent dialysis. And states that his target weight is 100.5 kg. Today's weight is 105 kg. He reports that with his diarrhea, he does not have any blood in stool. He has mild cramping. He denies any changes in his medication regimen. He is not able to correlate symptoms with any particular food intake. No chest pain or shortness of breath No significant leg swelling  Objective:  Vital signs in last 24 hours:  Temp:  [97.8 F (36.6 C)] 97.8 F (36.6 C) (01/04 0927) Pulse Rate:  [74-80] 74 (01/04 1200) Resp:  [14-20] 15 (01/04 1200) BP: (142-170)/(62-84) 142/62 (01/04 1200) SpO2:  [96 %-99 %] 98 % (01/04 1200) Weight:  [105 kg (231 lb 7.7 oz)] 105 kg (231 lb 7.7 oz) (01/04 0927)  Weight change:  Filed Weights   10/02/16 0927  Weight: 105 kg (231 lb 7.7 oz)    Intake/Output:   No intake or output data in the 24 hours ending 10/02/16 1333   Physical Exam: General: No acute distress, laying in the bed   HEENT Anicteric, moist oral mucous membranes   Neck supple   Pulm/lungs Normal breathing effort, clear to auscultation   CVS/Heart Regular, no gallop   Abdomen:  Soft, nontender, nondistended   Extremities: No edema   Neurologic: Alert, oriented   Skin: No acute rashes   Access: Left upper arm AV fistula        Basic Metabolic Panel:   Recent Labs Lab 10/02/16 0955 10/02/16 1224  NA 135  --   K 6.5*  --   CL 99*  --   CO2 24  --   GLUCOSE 107*  --   BUN 86*  --   CREATININE 11.63* 11.95*  CALCIUM 8.3*  --      CBC:  Recent Labs Lab 10/02/16 0955  WBC 4.7  NEUTROABS 2.7  HGB 10.9*  HCT 32.3*  MCV  84.7  PLT 124*      Microbiology:  Recent Results (from the past 720 hour(s))  C difficile quick scan w PCR reflex     Status: None   Collection Time: 10/02/16  9:55 AM  Result Value Ref Range Status   C Diff antigen NEGATIVE NEGATIVE Final   C Diff toxin NEGATIVE NEGATIVE Final   C Diff interpretation No C. difficile detected.  Final    Coagulation Studies: No results for input(s): LABPROT, INR in the last 72 hours.  Urinalysis: No results for input(s): COLORURINE, LABSPEC, PHURINE, GLUCOSEU, HGBUR, BILIRUBINUR, KETONESUR, PROTEINUR, UROBILINOGEN, NITRITE, LEUKOCYTESUR in the last 72 hours.  Invalid input(s): APPERANCEUR    Imaging: No results found.   Medications:   . sodium chloride     . [START ON 10/03/2016] calcitRIOL  0.25 mcg Oral Once per day on Mon Wed Fri  . calcium acetate  1,334 mg Oral TID WC  . citalopram  10 mg Oral Daily  . furosemide  80 mg Oral BID  . heparin  5,000 Units Subcutaneous Q8H  . hydrALAZINE  25 mg Oral Q8H  . insulin aspart  0-15 Units Subcutaneous TID WC   acetaminophen **OR**  acetaminophen, ondansetron **OR** ondansetron (ZOFRAN) IV  Assessment/ Plan:  61 y.o. Hispanic male with a PMHx of long-standing diabetes mellitus type 2, end-stage renal disease on hemodialysis started 1 year ago, gout, hypertension, anemia chronic kidney disease, secondary hyperparathyroidism,    [address]/MWF  1. End-stage renal disease 2. Severe hyperkalemia, 6.5 3. Anemia of chronic kidney disease, hemoglobin 10.9 4. Secondary hyperparathyroidism  5. Diarrhea   Plan: Urgent dialysis treatment today to correct potassium Next dialysis treatment anticipated tomorrow on his regular day Workup for diarrhea is in progress Low dose EPO with hemodialysis We'll follow closely    LOS: 0 Geo Slone 1/4/20181:33 PM

## 2016-10-02 NOTE — ED Notes (Signed)
Pt taken to Dialysis by [facility], EDT, report given to 2A RN Lorrin Goodell, RN. Pt to go to 2A when done with dialysis.

## 2016-10-03 LAB — BASIC METABOLIC PANEL
ANION GAP: 7 (ref 5–15)
BUN: 45 mg/dL — ABNORMAL HIGH (ref 6–20)
CO2: 30 mmol/L (ref 22–32)
Calcium: 8.3 mg/dL — ABNORMAL LOW (ref 8.9–10.3)
Chloride: 99 mmol/L — ABNORMAL LOW (ref 101–111)
Creatinine, Ser: 7.41 mg/dL — ABNORMAL HIGH (ref 0.61–1.24)
GFR calc Af Amer: 8 mL/min — ABNORMAL LOW (ref >60)
GFR calc non Af Amer: 7 mL/min — ABNORMAL LOW (ref >60)
Glucose, Bld: 179 mg/dL — ABNORMAL HIGH (ref 65–99)
POTASSIUM: 5 mmol/L (ref 3.5–5.1)
SODIUM: 136 mmol/L (ref 135–145)

## 2016-10-03 LAB — GLUCOSE, CAPILLARY
GLUCOSE-CAPILLARY: 108 mg/dL — AB (ref 65–99)
Glucose-Capillary: 135 mg/dL — ABNORMAL HIGH (ref 65–99)
Glucose-Capillary: 151 mg/dL — ABNORMAL HIGH (ref 65–99)
Glucose-Capillary: 118 mg/dL — ABNORMAL HIGH (ref 65–99)

## 2016-10-03 LAB — CBC
HEMATOCRIT: 33.1 % — AB (ref 40.0–52.0)
HEMOGLOBIN: 11.3 g/dL — AB (ref 13.0–18.0)
MCH: 28.9 pg (ref 26.0–34.0)
MCHC: 34.1 g/dL (ref 32.0–36.0)
MCV: 84.8 fL (ref 80.0–100.0)
Platelets: 127 10*3/uL — ABNORMAL LOW (ref 150–440)
RBC: 3.9 MIL/uL — ABNORMAL LOW (ref 4.40–5.90)
RDW: 17.5 % — ABNORMAL HIGH (ref 11.5–14.5)
WBC: 4.1 10*3/uL (ref 3.8–10.6)

## 2016-10-03 NOTE — Progress Notes (Signed)
[facility] [place] NAME: Deshan Hemmelgarn    MR#:  [id number]  DATE OF BIRTH:  [birth year]  SUBJECTIVE:  CHIEF COMPLAINT:   Chief Complaint  Patient presents with  . Abdominal Pain  . Diarrhea  Admitted for diarrhea. Found to have potassium of 6.5 after missing dialysis. Her dialysis yesterday and potassium down to 5.  Patient continues to have diarrhea. Had 6 watery stool today. No vomiting. Mild cramping in lower abdomen with stools. Afebrile.  REVIEW OF SYSTEMS:    Review of Systems  Constitutional: Positive for malaise/fatigue. Negative for chills and fever.  HENT: Negative for sore throat.   Eyes: Negative for blurred vision, double vision and pain.  Respiratory: Negative for cough, hemoptysis, shortness of breath and wheezing.   Cardiovascular: Negative for chest pain, palpitations, orthopnea and leg swelling.  Gastrointestinal: Positive for abdominal pain and diarrhea. Negative for constipation, heartburn, nausea and vomiting.  Genitourinary: Negative for dysuria and hematuria.  Musculoskeletal: Negative for back pain and joint pain.  Skin: Negative for rash.  Neurological: Positive for weakness. Negative for sensory change, speech change, focal weakness and headaches.  Endo/Heme/Allergies: Does not bruise/bleed easily.  Psychiatric/Behavioral: Negative for depression. The patient is not nervous/anxious.     DRUG ALLERGIES:  No Known Allergies  VITALS:  Blood pressure (!) 144/69, pulse 78, temperature 98.4 F (36.9 C), temperature source Oral, resp. rate 18, height 5\' 9"  (1.753 m), weight 93.9 kg (207 lb 1.6 oz), SpO2 100 %.  PHYSICAL EXAMINATION:   Physical Exam  GENERAL:  61 y.o.-year-old patient lying in the bed with no acute distress.  EYES: Pupils equal, round, reactive to light and accommodation. No scleral icterus. Extraocular muscles intact.  HEENT: Head atraumatic, normocephalic. Oropharynx and nasopharynx clear.   NECK:  Supple, no jugular venous distention. No thyroid enlargement, no tenderness.  LUNGS: Normal breath sounds bilaterally, no wheezing, rales, rhonchi. No use of accessory muscles of respiration.  CARDIOVASCULAR: S1, S2 normal. No murmurs, rubs, or gallops.  ABDOMEN: Soft, nontender, nondistended. Bowel sounds present. No organomegaly or mass.  EXTREMITIES: No cyanosis, clubbing or edema b/l.    NEUROLOGIC: Cranial nerves II through XII are intact. No focal Motor or sensory deficits b/l.   PSYCHIATRIC: The patient is alert and oriented x 3.  SKIN: No obvious rash, lesion, or ulcer.   LABORATORY PANEL:   CBC  Recent Labs Lab 10/03/16 0319  WBC 4.1  HGB 11.3*  HCT 33.1*  PLT 127*   ------------------------------------------------------------------------------------------------------------------ Chemistries   Recent Labs Lab 10/02/16 0955  10/03/16 0319  NA 135  --  136  K 6.5*  --  5.0  CL 99*  --  99*  CO2 24  --  30  GLUCOSE 107*  --  179*  BUN 86*  --  45*  CREATININE 11.63*  < > 7.41*  CALCIUM 8.3*  --  8.3*  AST 29  --   --   ALT 38  --   --   ALKPHOS 127*  --   --   BILITOT 0.5  --   --   < > = values in this interval not displayed. ------------------------------------------------------------------------------------------------------------------  Cardiac Enzymes No results for input(s): TROPONINI in the last 168 hours. ------------------------------------------------------------------------------------------------------------------  RADIOLOGY:  No results found.   ASSESSMENT AND PLAN:   * Acute viral gastroenteritis. Stool PCR positive for Sapovirus Symptomatic treatment. Continue to have diarrhea.  * End-stage renal disease with Acute hyperkalemia due to missing  dialysis has improved with dialysis. Potassium down to 5. Discussed with Dr. Candiss Norse of nephrology.  * Hypertension Continue home medications  * Diabetes mellitus with sliding scale  insulin  All the records are reviewed and case discussed with Care Management/Social Workerr. Management plans discussed with the patient, family and they are in agreement.  CODE STATUS: FULL CODE  DVT Prophylaxis: SCDs  TOTAL TIME TAKING CARE OF THIS PATIENT: 30 minutes.   POSSIBLE D/C IN 1-2 DAYS, DEPENDING ON CLINICAL CONDITION.  Hillary Bow R M.D on 10/03/2016 at 10:43 AM  Between 7am to 6pm - Pager - [phone]  After 6pm go to [email or web address] - password [email or web address] [facility]  Office  [phone]  CC: Primary care physician; Pcp Not In System  Note: This dictation was prepared with Dragon dictation along with smaller phrase technology. Any transcriptional errors that result from this process are unintentional.

## 2016-10-03 NOTE — Progress Notes (Signed)
Subjective:   Patient known to our practice from previous admission in August 2017. He presents this time for new onset of loose stools for the past 4 days. He missed his dialysis because of that. His last treatment was on "Sunday which is 4 days ago. He presents to the ER and is found to have high potassium. He underwent urgent dialysis on Thursday Today is his regular dialysis day.  He states he has been having perfuse diarrhea.  He has been to about 7 times.  He's requesting to postpone dialysis tomorrow. Potassium level was corrected to 5.0 Stool is negative for C. Difficile Sapovirus (I, II, IV, and V) detected    Objective:  Vital signs in last 24 hours:  Temp:  [97.6 F (36.4 C)-98.5 F (36.9 C)] 98.5 F (36.9 C) (01/05 1201) Pulse Rate:  [63-93] 79 (01/05 1201) Resp:  [13-21] 18 (01/05 1201) BP: (133-179)/(60-80) 156/80 (01/05 1201) SpO2:  [92 %-100 %] 96 % (01/05 1201) Weight:  [93.9 kg (207 lb 1.6 oz)-101 kg (222 lb 10.6 oz)] 93.9 kg (207 lb 1.6 oz) (01/05 0500)  Weight change:  Filed Weights   10/02/16 1748 10/02/16 1824 10/03/16 0500  Weight: 101 kg (222 lb 10.6 oz) 95.3 kg (210 lb 3.2 oz) 93.9 kg (207 lb 1.6 oz)    Intake/Output:    Intake/Output Summary (Last 24 hours) at 10/03/16 1441 Last data filed at 10/02/16 2222  Gross per 24 hour  Intake              240 ml  Output             40" 00 ml  Net            -3760 ml     Physical Exam: General: No acute distress, laying in the bed   HEENT Anicteric, moist oral mucous membranes   Neck supple   Pulm/lungs Normal breathing effort, clear to auscultation   CVS/Heart Regular, no gallop   Abdomen:  Soft, nontender, nondistended   Extremities: No edema   Neurologic: Alert, oriented   Skin: No acute rashes   Access: Left upper arm AV fistula        Basic Metabolic Panel:   Recent Labs Lab 10/02/16 0955 10/02/16 1224 10/03/16 0319  NA 135  --  136  K 6.5*  --  5.0  CL 99*  --  99*  CO2 24  --  30   GLUCOSE 107*  --  179*  BUN 86*  --  45*  CREATININE 11.63* 11.95* 7.41*  CALCIUM 8.3*  --  8.3*     CBC:  Recent Labs Lab 10/02/16 0955 10/03/16 0319  WBC 4.7 4.1  NEUTROABS 2.7  --   HGB 10.9* 11.3*  HCT 32.3* 33.1*  MCV 84.7 84.8  PLT 124* 127*      Microbiology:  Recent Results (from the past 720 hour(s))  Gastrointestinal Panel by PCR , Stool     Status: Abnormal   Collection Time: 10/02/16  9:55 AM  Result Value Ref Range Status   Campylobacter species NOT DETECTED NOT DETECTED Final   Plesimonas shigelloides NOT DETECTED NOT DETECTED Final   Salmonella species NOT DETECTED NOT DETECTED Final   Yersinia enterocolitica NOT DETECTED NOT DETECTED Final   Vibrio species NOT DETECTED NOT DETECTED Final   Vibrio cholerae NOT DETECTED NOT DETECTED Final   Enteroaggregative E coli (EAEC) NOT DETECTED NOT DETECTED Final   Enteropathogenic E coli (EPEC) NOT DETECTED NOT DETECTED  Final   Enterotoxigenic E coli (ETEC) NOT DETECTED NOT DETECTED Final   Shiga like toxin producing E coli (STEC) NOT DETECTED NOT DETECTED Final   Shigella/Enteroinvasive E coli (EIEC) NOT DETECTED NOT DETECTED Final   Cryptosporidium NOT DETECTED NOT DETECTED Final   Cyclospora cayetanensis NOT DETECTED NOT DETECTED Final   Entamoeba histolytica NOT DETECTED NOT DETECTED Final   Giardia lamblia NOT DETECTED NOT DETECTED Final   Adenovirus F40/41 NOT DETECTED NOT DETECTED Final   Astrovirus NOT DETECTED NOT DETECTED Final   Norovirus GI/GII NOT DETECTED NOT DETECTED Final   Rotavirus A NOT DETECTED NOT DETECTED Final   Sapovirus (I, II, IV, and V) DETECTED (A) NOT DETECTED Final  C difficile quick scan w PCR reflex     Status: None   Collection Time: 10/02/16  9:55 AM  Result Value Ref Range Status   C Diff antigen NEGATIVE NEGATIVE Final   C Diff toxin NEGATIVE NEGATIVE Final   C Diff interpretation No C. difficile detected.  Final    Coagulation Studies: No results for input(s):  LABPROT, INR in the last 72 hours.  Urinalysis: No results for input(s): COLORURINE, LABSPEC, PHURINE, GLUCOSEU, HGBUR, BILIRUBINUR, KETONESUR, PROTEINUR, UROBILINOGEN, NITRITE, LEUKOCYTESUR in the last 72 hours.  Invalid input(s): APPERANCEUR    Imaging: No results found.   Medications:    . sodium chloride   Intravenous Once  . calcitRIOL  0.25 mcg Oral Once per day on Mon Wed Fri  . calcium acetate  1,334 mg Oral TID WC  . citalopram  10 mg Oral Daily  . epoetin (EPOGEN/PROCRIT) injection  4,000 Units Intravenous Q M,W,F-HD  . furosemide  80 mg Oral BID  . heparin  5,000 Units Subcutaneous Q8H  . hydrALAZINE  25 mg Oral Q8H  . insulin aspart  0-15 Units Subcutaneous TID WC   acetaminophen **OR** acetaminophen, ondansetron **OR** ondansetron (ZOFRAN) IV  Assessment/ Plan:  61 y.o. Hispanic male with a PMHx of long-standing diabetes mellitus type 2, end-stage renal disease on hemodialysis started 1 year ago, gout, hypertension, anemia chronic kidney disease, secondary hyperparathyroidism,    [address]/MWF  1. End-stage renal disease 2. Severe hyperkalemia, 6.5- >5.0 3. Anemia of chronic kidney disease, hemoglobin 10.9 4. Secondary hyperparathyroidism  5. Diarrhea   Plan: Patient was educated regarding low potassium bath yesterday Postpone dialysis treatment until tomorrow due to frequent loose stools Low dose EPO with hemodialysis We'll follow closely    LOS: 1 Malachi Kinzler 1/5/20182:41 PM

## 2016-10-03 NOTE — Care Management (Signed)
Chronic HD at [facility].  Agency informed CM is compliant with his treatment regime.  Notified agency of admission and faxed information to [facility] with [facility] [employer]

## 2016-10-04 DIAGNOSIS — D696 Thrombocytopenia, unspecified: Secondary | ICD-10-CM

## 2016-10-04 DIAGNOSIS — D649 Anemia, unspecified: Secondary | ICD-10-CM

## 2016-10-04 DIAGNOSIS — A084 Viral intestinal infection, unspecified: Secondary | ICD-10-CM

## 2016-10-04 DIAGNOSIS — K625 Hemorrhage of anus and rectum: Secondary | ICD-10-CM

## 2016-10-04 LAB — PLATELET COUNT: PLATELETS: 131 10*3/uL — AB (ref 150–440)

## 2016-10-04 LAB — BASIC METABOLIC PANEL
Anion gap: 8 (ref 5–15)
BUN: 59 mg/dL — ABNORMAL HIGH (ref 6–20)
CO2: 30 mmol/L (ref 22–32)
Calcium: 8.3 mg/dL — ABNORMAL LOW (ref 8.9–10.3)
Chloride: 99 mmol/L — ABNORMAL LOW (ref 101–111)
Creatinine, Ser: 9.2 mg/dL — ABNORMAL HIGH (ref 0.61–1.24)
GFR calc non Af Amer: 5 mL/min — ABNORMAL LOW (ref >60)
GFR, EST AFRICAN AMERICAN: 6 mL/min — AB (ref >60)
Glucose, Bld: 93 mg/dL (ref 65–99)
POTASSIUM: 5.7 mmol/L — AB (ref 3.5–5.1)
SODIUM: 137 mmol/L (ref 135–145)

## 2016-10-04 LAB — GLUCOSE, CAPILLARY
GLUCOSE-CAPILLARY: 108 mg/dL — AB (ref 65–99)
Glucose-Capillary: 136 mg/dL — ABNORMAL HIGH (ref 65–99)

## 2016-10-04 MED ORDER — LOPERAMIDE HCL 2 MG PO CAPS
2.0000 mg | ORAL_CAPSULE | Freq: Four times a day (QID) | ORAL | Status: DC | PRN
  Administered 2016-10-04: 2 mg via ORAL
  Filled 2016-10-04: qty 1

## 2016-10-04 MED ORDER — LOPERAMIDE HCL 2 MG PO CAPS: 2.0000 mg | ORAL_CAPSULE | Freq: Four times a day (QID) | ORAL | 0 refills | Status: DC | PRN

## 2016-10-04 NOTE — Discharge Summary (Signed)
[name] [place] NAME: Danny [name]    MR#:  [id number]  DATE OF BIRTH:  1956-06-12  DATE OF ADMISSION:  10/02/2016 ADMITTING PHYSICIAN: Epifanio Lesches, MD  DATE OF DISCHARGE: No discharge date for patient encounter.  PRIMARY CARE PHYSICIAN: Pcp Not In System     ADMISSION DIAGNOSIS:  End stage renal disease ([name]) [N18.6] Hyperkalemia [E87.5] Diarrhea, unspecified type [R19.7]  DISCHARGE DIAGNOSIS:  Principal Problem:   Hyperkalemia Active Problems:   Viral gastroenteritis   Anemia   Thrombocytopenia (HCC)   Rectal bleeding   SECONDARY DIAGNOSIS:   Past Medical History:  Diagnosis Date  . Diabetes mellitus without complication ([name])   . Dialysis patient [name])    tuesday, thursday and saturday  . Gout   . Hypertension   . Kidney disease     .pro HOSPITAL COURSE:   Patient is 61 year old Spanish male with past medical history significant for history of diabetes, end-stage renal disease, on dialysis Tuesdays, Thursdays, Saturdays, gout, hypertension, who presents to the hospital with complaints of 40 history of diarrhea associated with abdominal cramping, no nausea, vomiting. On arrival to the hospital. Patient was noted to have severe hyperkalemia with potassium more than 6 without EKG changes and was admitted to the hospital for further evaluation and treatment. Labs revealed renal failure, mild anemia, thrombocytopenia, no leukocytosis. Stool for C. difficile was negative, gastrointestinal panel revealed a Sapovirus, patient was treated symptomatically and improved clinically, however, continued to have loose stools . He was initiated on Imodium, if his stool frequency decreases, he is going to be discharged home today. In regards to hyperkalemia, patient underwent acute hemodialysis treatment sessions while in the hospital, he is to continue hemodialysis according to a prior schedule. Discussion by problem: #1.  Acute viral gastroenteritis, patient's stool PCR was positive for Sapovirus. Patient is to receive the symptomatic therapy including Imodium as needed, he was advised to drink plenty of fluids. Patient's orthostatic vital signs were negative for tilting #2. End-stage renal disease with acute hyperkalemia, due to missing dialysis session, improved with dialysis, patient received dialysis today on 10/04/2016. Next dialysis according to the schedule #3. Essential hypertension, patient is to continue home medications, patient's blood pressure was relatively well controlled, it is recommended to follow it as outpatient #4 diabetes mellitus type 2, patient's to continue diabetic diet, diabetic medications, follow-up with primary care physician for further recommendations DISCHARGE CONDITIONS:   Stable  CONSULTS OBTAINED:  Treatment Team:  Danny Iba, MD  DRUG ALLERGIES:  No Known Allergies  DISCHARGE MEDICATIONS:   Current Discharge Medication List    START taking these medications   Details  loperamide (IMODIUM) 2 MG capsule Take 1 capsule (2 mg total) by mouth every 6 (six) hours as needed for diarrhea or loose stools. Qty: 30 capsule, Refills: 0      CONTINUE these medications which have NOT CHANGED   Details  calcitRIOL (ROCALTROL) 0.25 MCG capsule Take 0.25 mcg by mouth 3 (three) times a week.    calcium acetate (PHOSLO) 667 MG capsule Take 1,334 mg by mouth 3 (three) times daily with meals.    citalopram (CELEXA) 20 MG tablet Take 10 mg by mouth daily.    furosemide (LASIX) 40 MG tablet Take 40 mg by mouth 2 (two) times daily.    insulin aspart protamine- aspart (NOVOLOG MIX 70/30) (70-30) 100 UNIT/ML injection Inject 18 Units into the skin 2 (two) times daily with a meal. 20 units  in the morning and 18 units at night     Liraglutide 18 MG/3ML SOPN Inject 1.8 mg into the skin daily.     hydrALAZINE (APRESOLINE) 25 MG tablet Take 1 tablet (25 mg total) by mouth every 8  (eight) hours. Qty: 90 tablet, Refills: 1    prochlorperazine (COMPAZINE) 5 MG tablet 1 tablet every 6 hours as needed for headache/nausea Qty: 20 tablet, Refills: 0         DISCHARGE INSTRUCTIONS:    The patient is to follow-up with primary care physician, primary nephrologist within 1 week after discharge, he is to receive hemodialysis sessions according to the schedule  If you experience worsening of your admission symptoms, develop shortness of breath, life threatening emergency, suicidal or homicidal thoughts you must seek medical attention immediately by calling 911 or calling your MD immediately  if symptoms less severe.  You Must read complete instructions/literature along with all the possible adverse reactions/side effects for all the Medicines you take and that have been prescribed to you. Take any new Medicines after you have completely understood and accept all the possible adverse reactions/side effects.   Please note  You were cared for by a hospitalist during your hospital stay. If you have any questions about your discharge medications or the care you received while you were in the hospital after you are discharged, you can call the unit and asked to speak with the hospitalist on call if the hospitalist that took care of you is not available. Once you are discharged, your primary care physician will handle any further medical issues. Please note that NO REFILLS for any discharge medications will be authorized once you are discharged, as it is imperative that you return to your primary care physician (or establish a relationship with a primary care physician if you do not have one) for your aftercare needs so that they can reassess your need for medications and monitor your lab values.    Today   CHIEF COMPLAINT:   Chief Complaint  Patient presents with  . Abdominal Pain  . Diarrhea    HISTORY OF PRESENT ILLNESS:  Danny [name]  is a 61 y.o. male with a known  history of diabetes, end-stage renal disease, on dialysis Tuesdays, Thursdays, Saturdays, gout, hypertension, who presents to the hospital with complaints of 40 history of diarrhea associated with abdominal cramping, no nausea, vomiting. On arrival to the hospital. Patient was noted to have severe hyperkalemia with potassium more than 6 without EKG changes and was admitted to the hospital for further evaluation and treatment. Labs revealed renal failure, mild anemia, thrombocytopenia, no leukocytosis. Stool for C. difficile was negative, gastrointestinal panel revealed a Sapovirus, patient was treated symptomatically and improved clinically, however, continued to have loose stools . He was initiated on Imodium, if his stool frequency decreases, he is going to be discharged home today. In regards to hyperkalemia, patient underwent acute hemodialysis treatment sessions while in the hospital, he is to continue hemodialysis according to a prior schedule. Discussion by problem: #1. Acute viral gastroenteritis, patient's stool PCR was positive for Sapovirus. Patient is to receive the symptomatic therapy including Imodium as needed, he was advised to drink plenty of fluids. Patient's orthostatic vital signs were negative for tilting #2. End-stage renal disease with acute hyperkalemia, due to missing dialysis session, improved with dialysis, patient received dialysis today on 10/04/2016. Next dialysis according to the schedule #3. Essential hypertension, patient is to continue home medications, patient's blood pressure was relatively well  controlled, it is recommended to follow it as outpatient #4 diabetes mellitus type 2, patient's to continue diabetic diet, diabetic medications, follow-up with primary care physician for further recommendations    VITAL SIGNS:  Blood pressure (!) 156/73, pulse 78, temperature 98 F (36.7 C), temperature source Oral, resp. rate 18, height 5\' 9"  (1.753 m), weight 97.4 kg (214 lb  11.7 oz), SpO2 98 %.  I/O:  No intake or output data in the 24 hours ending 10/04/16 1532  PHYSICAL EXAMINATION:  GENERAL:  61 y.o.-year-old patient lying in the bed with no acute distress.  EYES: Pupils equal, round, reactive to light and accommodation. No scleral icterus. Extraocular muscles intact.  HEENT: Head atraumatic, normocephalic. Oropharynx and nasopharynx clear.  NECK:  Supple, no jugular venous distention. No thyroid enlargement, no tenderness.  LUNGS: Normal breath sounds bilaterally, no wheezing, rales,rhonchi or crepitation. No use of accessory muscles of respiration.  CARDIOVASCULAR: S1, S2 normal. No murmurs, rubs, or gallops.  ABDOMEN: Soft, non-tender, non-distended. Bowel sounds present. No organomegaly or mass.  EXTREMITIES: No pedal edema, cyanosis, or clubbing.  NEUROLOGIC: Cranial nerves II through XII are intact. Muscle strength 5/5 in all extremities. Sensation intact. Gait not checked.  PSYCHIATRIC: The patient is alert and oriented x 3.  SKIN: No obvious rash, lesion, or ulcer.   DATA REVIEW:   CBC  Recent Labs Lab 10/03/16 0319 10/04/16 0432  WBC 4.1  --   HGB 11.3*  --   HCT 33.1*  --   PLT 127* 131*    Chemistries   Recent Labs Lab 10/02/16 0955  10/04/16 0432  NA 135  < > 137  K 6.5*  < > 5.7*  CL 99*  < > 99*  CO2 24  < > 30  GLUCOSE 107*  < > 93  BUN 86*  < > 59*  CREATININE 11.63*  < > 9.20*  CALCIUM 8.3*  < > 8.3*  AST 29  --   --   ALT 38  --   --   ALKPHOS 127*  --   --   BILITOT 0.5  --   --   < > = values in this interval not displayed.  Cardiac Enzymes No results for input(s): TROPONINI in the last 168 hours.  Microbiology Results  Results for orders placed or performed during the hospital encounter of 10/02/16  Gastrointestinal Panel by PCR , Stool     Status: Abnormal   Collection Time: 10/02/16  9:55 AM  Result Value Ref Range Status   Campylobacter species NOT DETECTED NOT DETECTED Final   Plesimonas  shigelloides NOT DETECTED NOT DETECTED Final   Salmonella species NOT DETECTED NOT DETECTED Final   Yersinia enterocolitica NOT DETECTED NOT DETECTED Final   Vibrio species NOT DETECTED NOT DETECTED Final   Vibrio cholerae NOT DETECTED NOT DETECTED Final   Enteroaggregative E coli (EAEC) NOT DETECTED NOT DETECTED Final   Enteropathogenic E coli (EPEC) NOT DETECTED NOT DETECTED Final   Enterotoxigenic E coli (ETEC) NOT DETECTED NOT DETECTED Final   Shiga like toxin producing E coli (STEC) NOT DETECTED NOT DETECTED Final   Shigella/Enteroinvasive E coli (EIEC) NOT DETECTED NOT DETECTED Final   Cryptosporidium NOT DETECTED NOT DETECTED Final   Cyclospora cayetanensis NOT DETECTED NOT DETECTED Final   Entamoeba histolytica NOT DETECTED NOT DETECTED Final   Giardia lamblia NOT DETECTED NOT DETECTED Final   Adenovirus F40/41 NOT DETECTED NOT DETECTED Final   Astrovirus NOT DETECTED NOT DETECTED Final  Norovirus GI/GII NOT DETECTED NOT DETECTED Final   Rotavirus A NOT DETECTED NOT DETECTED Final   Sapovirus (I, II, IV, and V) DETECTED (A) NOT DETECTED Final  C difficile quick scan w PCR reflex     Status: None   Collection Time: 10/02/16  9:55 AM  Result Value Ref Range Status   C Diff antigen NEGATIVE NEGATIVE Final   C Diff toxin NEGATIVE NEGATIVE Final   C Diff interpretation No C. difficile detected.  Final    RADIOLOGY:  No results found.  EKG:   Orders placed or performed during the hospital encounter of 10/02/16  . EKG 12-Lead  . EKG 12-Lead      Management plans discussed with the patient, family and they are in agreement.  CODE STATUS:     Code Status Orders        Start     Ordered   10/02/16 1203  Full code  Continuous     10/02/16 1203    Code Status History    Date Active Date Inactive Code Status Order ID Comments User Context   05/13/2016  7:54 PM 05/14/2016  6:23 PM Full Code [id number]  Gladstone Lighter, MD Inpatient      TOTAL TIME TAKING CARE OF  THIS PATIENT: 40 minutes.    Theodoro Grist M.D on 10/04/2016 at 3:32 PM  Between 7am to 6pm - Pager - [phone]  After 6pm go to [email or web address] - password [email or web address] [facility]  Office  [phone]  CC: Primary care physician; Pcp Not In System

## 2016-10-04 NOTE — Progress Notes (Signed)
Pre HD  

## 2016-10-04 NOTE — Progress Notes (Signed)
A&O. Independent. Refused bed alarm. Slept well through the night. Family at side.

## 2016-10-04 NOTE — Progress Notes (Signed)
HD tx started  

## 2016-10-04 NOTE — Progress Notes (Signed)
HD tx ended 

## 2016-10-04 NOTE — Progress Notes (Signed)
Discharge instructions along with home medication list and follow up gone over with patient and family. Both verbalized that they understood. Iv and telemetry removed. No c/o pain no distress noted. No rx given to patient. Patient to be discharged home

## 2016-10-04 NOTE — Progress Notes (Signed)
Post HD  

## 2016-10-04 NOTE — Progress Notes (Signed)
Subjective:   Patient known to our practice from previous admission in August 2017. He presents this time for new onset of loose stools for the past 4 days. He missed his dialysis because of that. His last treatment was on Sunday which is 4 days ago. He presents to the ER and is found to have high potassium. He underwent urgent dialysis on Thursday Stool is negative for C. Difficile Sapovirus (I, II, IV, and V) detected  Patient continues to have loose stools and states that he has to go to bathroom every 10 min. He is requesting his dialysis to be done in his room No fevers, shortness of breath Appetite is good.  Able to eat normally    Objective:  Vital signs in last 24 hours:  Temp:  [97.9 F (36.6 C)-98.4 F (36.9 C)] 98 F (36.7 C) (01/06 1350) Pulse Rate:  [75-141] 78 (01/06 1355) Resp:  [16-19] 18 (01/06 1350) BP: (145-165)/(65-87) 156/73 (01/06 1355) SpO2:  [90 %-98 %] 98 % (01/06 1350) Weight:  [97.4 kg (214 lb 11.7 oz)-97.4 kg (214 lb 12.8 oz)] 97.4 kg (214 lb 11.7 oz) (01/06 1350)  Weight change: -7.567 kg (-16 lb 10.9 oz) Filed Weights   10/03/16 0500 10/04/16 0459 10/04/16 1350  Weight: 93.9 kg (207 lb 1.6 oz) 97.4 kg (214 lb 12.8 oz) 97.4 kg (214 lb 11.7 oz)    Intake/Output:   No intake or output data in the 24 hours ending 10/04/16 1416   Physical Exam: General: No acute distress, laying in the bed   HEENT Anicteric, moist oral mucous membranes   Neck supple   Pulm/lungs Normal breathing effort, clear to auscultation   CVS/Heart Regular, no gallop   Abdomen:  Soft, nontender, nondistended   Extremities: No edema   Neurologic: Alert, oriented   Skin: No acute rashes   Access: Left upper arm AV fistula        Basic Metabolic Panel:   Recent Labs Lab 10/02/16 0955 10/02/16 1224 10/03/16 0319 10/04/16 0432  NA 135  --  136 137  K 6.5*  --  5.0 5.7*  CL 99*  --  99* 99*  CO2 24  --  30 30  GLUCOSE 107*  --  179* 93  BUN 86*  --  45* 59*   CREATININE 11.63* 11.95* 7.41* 9.20*  CALCIUM 8.3*  --  8.3* 8.3*     CBC:  Recent Labs Lab 10/02/16 0955 10/03/16 0319 10/04/16 0432  WBC 4.7 4.1  --   NEUTROABS 2.7  --   --   HGB 10.9* 11.3*  --   HCT 32.3* 33.1*  --   MCV 84.7 84.8  --   PLT 124* 127* 131*      Microbiology:  Recent Results (from the past 720 hour(s))  Gastrointestinal Panel by PCR , Stool     Status: Abnormal   Collection Time: 10/02/16  9:55 AM  Result Value Ref Range Status   Campylobacter species NOT DETECTED NOT DETECTED Final   Plesimonas shigelloides NOT DETECTED NOT DETECTED Final   Salmonella species NOT DETECTED NOT DETECTED Final   Yersinia enterocolitica NOT DETECTED NOT DETECTED Final   Vibrio species NOT DETECTED NOT DETECTED Final   Vibrio cholerae NOT DETECTED NOT DETECTED Final   Enteroaggregative E coli (EAEC) NOT DETECTED NOT DETECTED Final   Enteropathogenic E coli (EPEC) NOT DETECTED NOT DETECTED Final   Enterotoxigenic E coli (ETEC) NOT DETECTED NOT DETECTED Final   Shiga like toxin producing  E coli (STEC) NOT DETECTED NOT DETECTED Final   Shigella/Enteroinvasive E coli (EIEC) NOT DETECTED NOT DETECTED Final   Cryptosporidium NOT DETECTED NOT DETECTED Final   Cyclospora cayetanensis NOT DETECTED NOT DETECTED Final   Entamoeba histolytica NOT DETECTED NOT DETECTED Final   Giardia lamblia NOT DETECTED NOT DETECTED Final   Adenovirus F40/41 NOT DETECTED NOT DETECTED Final   Astrovirus NOT DETECTED NOT DETECTED Final   Norovirus GI/GII NOT DETECTED NOT DETECTED Final   Rotavirus A NOT DETECTED NOT DETECTED Final   Sapovirus (I, II, IV, and V) DETECTED (A) NOT DETECTED Final  C difficile quick scan w PCR reflex     Status: None   Collection Time: 10/02/16  9:55 AM  Result Value Ref Range Status   C Diff antigen NEGATIVE NEGATIVE Final   C Diff toxin NEGATIVE NEGATIVE Final   C Diff interpretation No C. difficile detected.  Final    Coagulation Studies: No results for  input(s): LABPROT, INR in the last 72 hours.  Urinalysis: No results for input(s): COLORURINE, LABSPEC, PHURINE, GLUCOSEU, HGBUR, BILIRUBINUR, KETONESUR, PROTEINUR, UROBILINOGEN, NITRITE, LEUKOCYTESUR in the last 72 hours.  Invalid input(s): APPERANCEUR    Imaging: No results found.   Medications:    . sodium chloride   Intravenous Once  . calcitRIOL  0.25 mcg Oral Once per day on Mon Wed Fri  . calcium acetate  1,334 mg Oral TID WC  . citalopram  10 mg Oral Daily  . epoetin (EPOGEN/PROCRIT) injection  4,000 Units Intravenous Q M,W,F-HD  . furosemide  80 mg Oral BID  . heparin  5,000 Units Subcutaneous Q8H  . hydrALAZINE  25 mg Oral Q8H  . insulin aspart  0-15 Units Subcutaneous TID WC   acetaminophen **OR** acetaminophen, loperamide, ondansetron **OR** ondansetron (ZOFRAN) IV  Assessment/ Plan:  61 y.o. Hispanic male with a PMHx of long-standing diabetes mellitus type 2, end-stage renal disease on hemodialysis started 1 year ago, gout, hypertension, anemia chronic kidney disease, secondary hyperparathyroidism,    [address]/MWF  1. End-stage renal disease 2. Severe hyperkalemia, 6.5- >5.0-> 5.7 3. Anemia of chronic kidney disease, hemoglobin 10.9 4. Secondary hyperparathyroidism  5. Diarrhea   Plan: Dialysis in patient's room due to enteric precautions Potassium expected to correct with hemodialysis Low dose EPO with hemodialysis      LOS: 2 Kohei Antonellis 1/6/20182:16 PM

## 2016-10-04 NOTE — Progress Notes (Signed)
PRE hd 

## 2017-03-30 MED ORDER — FUROSEMIDE 80 MG TABLET
ORAL_TABLET | Freq: Every day | ORAL | 5 refills | 0.00000 days | Status: CP
Start: 2017-03-30 — End: 2018-03-30

## 2017-04-24 MED ORDER — GABAPENTIN 300 MG CAPSULE
ORAL_CAPSULE | 99 refills | 0 days | Status: CP
Start: 2017-04-24 — End: 2019-05-31

## 2017-04-28 ENCOUNTER — Ambulatory Visit: Admission: RE | Admit: 2017-04-28 | Discharge: 2017-04-28

## 2017-05-05 MED ORDER — CALCIUM ACETATE(PHOSPHATE BINDERS) 667 MG CAPSULE
ORAL_CAPSULE | 99 refills | 0 days | Status: CP
Start: 2017-05-05 — End: 2019-03-01

## 2017-05-29 ENCOUNTER — Ambulatory Visit: Admission: RE | Admit: 2017-05-29 | Discharge: 2017-05-29

## 2017-06-22 MED ORDER — CALCIUM ACETATE(PHOSPHATE BINDERS) 667 MG CAPSULE: 2668 mg | capsule | Freq: Three times a day (TID) | 3 refills | 0 days | Status: AC

## 2017-06-22 MED ORDER — CALCIUM ACETATE(PHOSPHATE BINDERS) 667 MG CAPSULE
ORAL_CAPSULE | Freq: Three times a day (TID) | ORAL | 30 refills | 0.00000 days | Status: CP
Start: 2017-06-22 — End: 2017-06-22

## 2017-06-28 ENCOUNTER — Ambulatory Visit: Admission: RE | Admit: 2017-06-28 | Discharge: 2017-06-28 | Payer: MEDICAID

## 2017-07-13 ENCOUNTER — Ambulatory Visit: Admission: RE | Admit: 2017-07-13 | Discharge: 2017-07-13 | Attending: Retina Specialist | Admitting: Retina Specialist

## 2017-07-13 DIAGNOSIS — H547 Unspecified visual loss: Principal | ICD-10-CM

## 2017-07-16 ENCOUNTER — Ambulatory Visit: Admission: RE | Admit: 2017-07-16 | Discharge: 2017-07-16 | Attending: Retina Specialist | Admitting: Retina Specialist

## 2017-07-16 DIAGNOSIS — H3562 Retinal hemorrhage, left eye: Principal | ICD-10-CM

## 2017-07-28 MED ORDER — CALCIUM ACETATE(PHOSPHATE BINDERS) 667 MG CAPSULE: capsule | 30 refills | 0 days

## 2017-07-28 MED ORDER — CALCIUM ACETATE(PHOSPHATE BINDERS) 667 MG CAPSULE
ORAL_CAPSULE | Freq: Three times a day (TID) | ORAL | 30 refills | 0.00000 days | Status: CP
Start: 2017-07-28 — End: 2017-07-28

## 2017-07-29 ENCOUNTER — Ambulatory Visit: Admission: RE | Admit: 2017-07-29 | Discharge: 2017-07-29

## 2017-08-10 MED ORDER — LISINOPRIL 10 MG TABLET
ORAL_TABLET | Freq: Every day | ORAL | 3 refills | 0.00000 days | Status: CP
Start: 2017-08-10 — End: 2017-08-10

## 2017-08-10 MED ORDER — AMLODIPINE 5 MG TABLET
ORAL_TABLET | Freq: Every day | ORAL | 3 refills | 0.00000 days | Status: CP
Start: 2017-08-10 — End: 2018-01-28

## 2017-08-10 MED ORDER — CALCIUM ACETATE(PHOSPHATE BINDERS) 667 MG CAPSULE: capsule | 30 refills | 0 days

## 2017-08-10 MED ORDER — CALCIUM ACETATE(PHOSPHATE BINDERS) 667 MG CAPSULE
ORAL_CAPSULE | Freq: Three times a day (TID) | ORAL | 3 refills | 0.00000 days | Status: CP
Start: 2017-08-10 — End: 2018-01-08

## 2017-08-10 MED ORDER — AMLODIPINE 5 MG TABLET: 5 mg | tablet | Freq: Every day | 3 refills | 0 days | Status: AC

## 2017-08-10 MED ORDER — LISINOPRIL 10 MG TABLET: 10 mg | tablet | 3 refills | 0 days

## 2017-08-28 ENCOUNTER — Ambulatory Visit: Admission: RE | Admit: 2017-08-28 | Discharge: 2017-08-28

## 2017-08-28 MED ORDER — LISINOPRIL 20 MG TABLET
ORAL_TABLET | Freq: Every day | ORAL | 3 refills | 0.00000 days | Status: CP
Start: 2017-08-28 — End: 2018-01-08

## 2017-09-22 ENCOUNTER — Emergency Department: Payer: Self-pay

## 2017-09-22 ENCOUNTER — Observation Stay
Admission: EM | Admit: 2017-09-22 | Discharge: 2017-09-23 | Disposition: A | Discharge status: Home / Self Care | Payer: Self-pay | Attending: Internal Medicine | Admitting: Internal Medicine

## 2017-09-22 ENCOUNTER — Encounter: Payer: Self-pay | Admitting: Emergency Medicine

## 2017-09-22 DIAGNOSIS — Z841 Family history of disorders of kidney and ureter: Secondary | ICD-10-CM | POA: Insufficient documentation

## 2017-09-22 DIAGNOSIS — I120 Hypertensive chronic kidney disease with stage 5 chronic kidney disease or end stage renal disease: Secondary | ICD-10-CM | POA: Insufficient documentation

## 2017-09-22 DIAGNOSIS — M109 Gout, unspecified: Secondary | ICD-10-CM | POA: Insufficient documentation

## 2017-09-22 DIAGNOSIS — I5020 Unspecified systolic (congestive) heart failure: Principal | ICD-10-CM | POA: Insufficient documentation

## 2017-09-22 DIAGNOSIS — Z8042 Family history of malignant neoplasm of prostate: Secondary | ICD-10-CM | POA: Insufficient documentation

## 2017-09-22 DIAGNOSIS — E1122 Type 2 diabetes mellitus with diabetic chronic kidney disease: Secondary | ICD-10-CM | POA: Insufficient documentation

## 2017-09-22 DIAGNOSIS — Z833 Family history of diabetes mellitus: Secondary | ICD-10-CM | POA: Insufficient documentation

## 2017-09-22 DIAGNOSIS — Z794 Long term (current) use of insulin: Secondary | ICD-10-CM | POA: Insufficient documentation

## 2017-09-22 DIAGNOSIS — E875 Hyperkalemia: Secondary | ICD-10-CM | POA: Insufficient documentation

## 2017-09-22 DIAGNOSIS — N186 End stage renal disease: Secondary | ICD-10-CM | POA: Insufficient documentation

## 2017-09-22 DIAGNOSIS — J811 Chronic pulmonary edema: Secondary | ICD-10-CM | POA: Diagnosis present

## 2017-09-22 DIAGNOSIS — R0602 Shortness of breath: Secondary | ICD-10-CM

## 2017-09-22 DIAGNOSIS — Z992 Dependence on renal dialysis: Secondary | ICD-10-CM | POA: Insufficient documentation

## 2017-09-22 LAB — COMPREHENSIVE METABOLIC PANEL
ALK PHOS: 93 U/L (ref 38–126)
ALT: 11 U/L — AB (ref 17–63)
AST: 23 U/L (ref 15–41)
Albumin: 3.7 g/dL (ref 3.5–5.0)
Anion gap: 10 (ref 5–15)
BILIRUBIN TOTAL: 1 mg/dL (ref 0.3–1.2)
BUN: 46 mg/dL — AB (ref 6–20)
CALCIUM: 9 mg/dL (ref 8.9–10.3)
CO2: 27 mmol/L (ref 22–32)
CREATININE: 9.28 mg/dL — AB (ref 0.61–1.24)
Chloride: 96 mmol/L — ABNORMAL LOW (ref 101–111)
GFR calc Af Amer: 6 mL/min — ABNORMAL LOW (ref >60)
GFR, EST NON AFRICAN AMERICAN: 5 mL/min — AB (ref >60)
Glucose, Bld: 111 mg/dL — ABNORMAL HIGH (ref 65–99)
Potassium: 5.3 mmol/L — ABNORMAL HIGH (ref 3.5–5.1)
Sodium: 133 mmol/L — ABNORMAL LOW (ref 135–145)
TOTAL PROTEIN: 7.6 g/dL (ref 6.5–8.1)

## 2017-09-22 LAB — BRAIN NATRIURETIC PEPTIDE: B NATRIURETIC PEPTIDE 5: 1416 pg/mL — AB (ref 0.0–100.0)

## 2017-09-22 LAB — CBC WITH DIFFERENTIAL/PLATELET
BASOS ABS: 0 10*3/uL (ref 0–0.1)
Basophils Relative: 1 %
EOS ABS: 0.3 10*3/uL (ref 0–0.7)
EOS PCT: 4 %
HCT: 25.7 % — ABNORMAL LOW (ref 40.0–52.0)
Hemoglobin: 8.8 g/dL — ABNORMAL LOW (ref 13.0–18.0)
Lymphocytes Relative: 13 %
Lymphs Abs: 1.1 10*3/uL (ref 1.0–3.6)
MCH: 32.3 pg (ref 26.0–34.0)
MCHC: 34.2 g/dL (ref 32.0–36.0)
MCV: 94.4 fL (ref 80.0–100.0)
Monocytes Absolute: 0.7 10*3/uL (ref 0.2–1.0)
Monocytes Relative: 9 %
Neutro Abs: 5.9 10*3/uL (ref 1.4–6.5)
Neutrophils Relative %: 73 %
PLATELETS: 169 10*3/uL (ref 150–440)
RBC: 2.72 MIL/uL — AB (ref 4.40–5.90)
RDW: 12.9 % (ref 11.5–14.5)
WBC: 8.1 10*3/uL (ref 3.8–10.6)

## 2017-09-22 LAB — GLUCOSE, CAPILLARY
GLUCOSE-CAPILLARY: 253 mg/dL — AB (ref 65–99)
Glucose-Capillary: 145 mg/dL — ABNORMAL HIGH (ref 65–99)
Glucose-Capillary: 207 mg/dL — ABNORMAL HIGH (ref 65–99)

## 2017-09-22 LAB — TROPONIN I
TROPONIN I: 0.03 ng/mL — AB (ref <0.03)
Troponin I: 0.03 ng/mL (ref <0.03)
Troponin I: <0.03 ng/mL (ref <0.03)

## 2017-09-22 LAB — RAPID HIV SCREEN (HIV 1/2 AB+AG)
HIV 1/2 Antibodies: NONREACTIVE
HIV-1 P24 ANTIGEN - HIV24: NONREACTIVE

## 2017-09-22 LAB — MRSA PCR SCREENING: MRSA by PCR: NEGATIVE

## 2017-09-22 MED ORDER — ONDANSETRON HCL 4 MG PO TABS: 4.0000 mg | ORAL_TABLET | Freq: Four times a day (QID) | ORAL | Status: DC | PRN

## 2017-09-22 MED ORDER — CALCITRIOL 0.25 MCG PO CAPS
0.2500 ug | ORAL_CAPSULE | Freq: Every day | ORAL | Status: DC
  Administered 2017-09-23: 0.25 ug via ORAL
  Filled 2017-09-22 (×2): qty 1

## 2017-09-22 MED ORDER — TRAMADOL HCL 50 MG PO TABS: 50.0000 mg | ORAL_TABLET | Freq: Four times a day (QID) | ORAL | Status: DC | PRN

## 2017-09-22 MED ORDER — ACETAMINOPHEN 650 MG RE SUPP
650.0000 mg | Freq: Four times a day (QID) | RECTAL | Status: DC | PRN
Start: 2017-09-22 — End: 2017-09-23

## 2017-09-22 MED ORDER — INSULIN ASPART 100 UNIT/ML ~~LOC~~ SOLN
0.0000 [IU] | Freq: Every day | SUBCUTANEOUS | Status: DC
  Administered 2017-09-22: 3 [IU] via SUBCUTANEOUS
  Filled 2017-09-22: qty 1

## 2017-09-22 MED ORDER — HEPARIN SODIUM (PORCINE) 5000 UNIT/ML IJ SOLN
5000.0000 [IU] | Freq: Three times a day (TID) | INTRAMUSCULAR | Status: DC
  Administered 2017-09-22 – 2017-09-23 (×2): 5000 [IU] via SUBCUTANEOUS
  Filled 2017-09-22 (×2): qty 1

## 2017-09-22 MED ORDER — ONDANSETRON HCL 4 MG/2ML IJ SOLN: 4.0000 mg | Freq: Four times a day (QID) | INTRAMUSCULAR | Status: DC | PRN

## 2017-09-22 MED ORDER — ASPIRIN 81 MG PO CHEW
324.0000 mg | CHEWABLE_TABLET | Freq: Once | ORAL | Status: AC
  Administered 2017-09-22: 324 mg via ORAL
  Filled 2017-09-22: qty 4

## 2017-09-22 MED ORDER — ALBUTEROL SULFATE (2.5 MG/3ML) 0.083% IN NEBU: 2.5000 mg | INHALATION_SOLUTION | RESPIRATORY_TRACT | Status: DC | PRN

## 2017-09-22 MED ORDER — INSULIN ASPART 100 UNIT/ML ~~LOC~~ SOLN
0.0000 [IU] | Freq: Three times a day (TID) | SUBCUTANEOUS | Status: DC
  Administered 2017-09-22: 1 [IU] via SUBCUTANEOUS
  Administered 2017-09-22: 3 [IU] via SUBCUTANEOUS
  Administered 2017-09-23: 1 [IU] via SUBCUTANEOUS
  Administered 2017-09-23: 3 [IU] via SUBCUTANEOUS
  Filled 2017-09-22 (×4): qty 1

## 2017-09-22 MED ORDER — ASPIRIN EC 81 MG PO TBEC: 81.0000 mg | DELAYED_RELEASE_TABLET | Freq: Every day | ORAL | Status: DC

## 2017-09-22 MED ORDER — NITROGLYCERIN 2 % TD OINT
0.5000 [in_us] | TOPICAL_OINTMENT | Freq: Once | TRANSDERMAL | Status: AC
Start: 2017-09-22 — End: 2017-09-22
  Administered 2017-09-22: 0.5 [in_us] via TOPICAL
  Filled 2017-09-22: qty 1

## 2017-09-22 MED ORDER — ACETAMINOPHEN 325 MG PO TABS: 650.0000 mg | ORAL_TABLET | Freq: Four times a day (QID) | ORAL | Status: DC | PRN

## 2017-09-22 MED ORDER — SODIUM CHLORIDE 0.9% FLUSH
3.0000 mL | Freq: Two times a day (BID) | INTRAVENOUS | Status: DC
  Administered 2017-09-22 – 2017-09-23 (×3): 3 mL via INTRAVENOUS

## 2017-09-22 MED ORDER — CALCIUM ACETATE (PHOS BINDER) 667 MG PO CAPS
2001.0000 mg | ORAL_CAPSULE | Freq: Three times a day (TID) | ORAL | Status: DC
  Administered 2017-09-22 – 2017-09-23 (×4): 2001 mg via ORAL
  Filled 2017-09-22 (×5): qty 3

## 2017-09-22 NOTE — H&P (Signed)
[facility] NAME: Danny [name]    MR#:  [id number]  DATE OF BIRTH:  [birth year]  DATE OF ADMISSION:  09/22/2017  PRIMARY CARE PHYSICIAN: System, Pcp Not In   REQUESTING/REFERRING PHYSICIAN: Dr. Cinda Quest  CHIEF COMPLAINT:   Chief Complaint  Patient presents with  . Shortness of Breath    HISTORY OF PRESENT ILLNESS:  Danny [name]  is a 61 y.o. male with a known history of end-stage renal disease, diabetes, hypertension, chronic anemia presents to the hospital complaining of orthopnea and some chest tightness.  Patient has had his dialysis on Sunday is and due again on Wednesday.  He does not make any urine.  Here in the emergency room he continues to have orthopnea although his saturations are normal.  Mild elevation in potassium to 5.3.  Chest x-ray showing pulmonary edema.  Troponin normal.  Patient is being admitted for inpatient dialysis due to symptomatic pulmonary edema secondary to end-stage renal disease. Discussed case with Dr. Rip Harbour and Dr. Juleen China.  PAST MEDICAL HISTORY:   Past Medical History:  Diagnosis Date  . Diabetes mellitus without complication ([place])   . Dialysis patient [facility])    tuesday, thursday and saturday  . Gout   . Hypertension   . Kidney disease     PAST SURGICAL HISTORY:   Past Surgical History:  Procedure Laterality Date  . AV FISTULA PLACEMENT    . miscellaneous     peritoneal dialysis catheter placement and removal    SOCIAL HISTORY:   Social History   Tobacco Use  . Smoking status: Never Smoker  . Smokeless tobacco: Never Used  Substance Use Topics  . Alcohol use: No    FAMILY HISTORY:   Family History  Problem Relation Age of Onset  . Diabetes Mother   . Prostate cancer Father   . Kidney disease Sister     DRUG ALLERGIES:  No Known Allergies  REVIEW OF SYSTEMS:   Review of Systems  Constitutional: Positive for malaise/fatigue. Negative for chills, fever and weight  loss.  HENT: Negative for hearing loss and nosebleeds.   Eyes: Negative for blurred vision, double vision and pain.  Respiratory: Positive for cough and shortness of breath. Negative for hemoptysis, sputum production and wheezing.   Cardiovascular: Positive for chest pain and orthopnea. Negative for palpitations and leg swelling.  Gastrointestinal: Negative for abdominal pain, constipation, diarrhea, nausea and vomiting.  Genitourinary: Negative for dysuria and hematuria.  Musculoskeletal: Negative for back pain, falls and myalgias.  Skin: Negative for rash.  Neurological: Negative for dizziness, tremors, sensory change, speech change, focal weakness, seizures and headaches.  Endo/Heme/Allergies: Does not bruise/bleed easily.  Psychiatric/Behavioral: Negative for depression and memory loss. The patient is not nervous/anxious.     MEDICATIONS AT HOME:   Prior to Admission medications   Medication Sig Start Date End Date Taking? Authorizing Provider  calcitRIOL (ROCALTROL) 0.25 MCG capsule Take 0.25 mcg by mouth 4 (four) times a week.    Yes [provider]  calcium acetate (PHOSLO) 667 MG capsule Take 2,668 mg by mouth 3 (three) times daily with meals.    Yes [provider]  citalopram (CELEXA) 20 MG tablet Take 20 mg by mouth daily.    Yes [provider]  insulin aspart protamine- aspart (NOVOLOG MIX 70/30) (70-30) 100 UNIT/ML injection Inject 10-12 Units into the skin 2 (two) times daily with a meal. 12 units in the morning and 10 units at  night   Yes [provider]  hydrALAZINE (APRESOLINE) 25 MG tablet Take 1 tablet (25 mg total) by mouth every 8 (eight) hours. Patient not taking: Reported on 10/02/2016 05/14/16   Fritzi Mandes, MD  loperamide (IMODIUM) 2 MG capsule Take 1 capsule (2 mg total) by mouth every 6 (six) hours as needed for diarrhea or loose stools. Patient not taking: Reported on 09/22/2017 10/04/16   Theodoro Grist, MD  prochlorperazine  (COMPAZINE) 5 MG tablet 1 tablet every 6 hours as needed for headache/nausea Patient not taking: Reported on 10/02/2016 02/20/16   Paulette Blanch, MD     VITAL SIGNS:  Blood pressure (!) 154/74, pulse 80, temperature 98.4 F (36.9 C), temperature source Oral, resp. rate (!) 21, height 5\' 8"  (1.727 m), weight 94 kg (207 lb 3.7 oz), SpO2 99 %.  PHYSICAL EXAMINATION:  Physical Exam  GENERAL:  61 y.o.-year-old patient lying in the bed with no acute distress.  EYES: Pupils equal, round, reactive to light and accommodation. No scleral icterus. Extraocular muscles intact.  HEENT: Head atraumatic, normocephalic. Oropharynx and nasopharynx clear. No oropharyngeal erythema, moist oral mucosa  NECK:  Supple, no jugular venous distention. No thyroid enlargement, no tenderness.  LUNGS: Normal breath sounds bilaterally, no wheezing, rales, rhonchi. No use of accessory muscles of respiration.  CARDIOVASCULAR: S1, S2 normal. No murmurs, rubs, or gallops.  ABDOMEN: Soft, nontender, nondistended. Bowel sounds present. No organomegaly or mass.  EXTREMITIES: No pedal edema, cyanosis, or clubbing. + 2 pedal & radial pulses b/l.   NEUROLOGIC: Cranial nerves II through XII are intact. No focal Motor or sensory deficits appreciated b/l PSYCHIATRIC: The patient is alert and oriented x 3. Good affect.  SKIN: No obvious rash, lesion, or ulcer.   LABORATORY PANEL:   CBC Recent Labs  Lab 09/22/17 0814  WBC 8.1  HGB 8.8*  HCT 25.7*  PLT 169   ------------------------------------------------------------------------------------------------------------------  Chemistries  Recent Labs  Lab 09/22/17 0814  NA 133*  K 5.3*  CL 96*  CO2 27  GLUCOSE 111*  BUN 46*  CREATININE 9.28*  CALCIUM 9.0  AST 23  ALT 11*  ALKPHOS 93  BILITOT 1.0   ------------------------------------------------------------------------------------------------------------------  Cardiac Enzymes Recent Labs  Lab 09/22/17 0814   TROPONINI 0.03*   ------------------------------------------------------------------------------------------------------------------  RADIOLOGY:  Dg Chest Portable 1 View  Result Date: 09/22/2017 CLINICAL DATA:  Shortness of breath since last night. EXAM: PORTABLE CHEST 1 VIEW COMPARISON:  05/13/2016 FINDINGS: Lungs are adequately inflated without focal airspace consolidation or effusion. There is stable hazy prominence of the central perihilar vessels suggesting vascular congestion. Mild stable cardiomegaly. Remainder of the exam is unchanged. IMPRESSION: Mild stable cardiomegaly with mild stable vascular congestion. Electronically Signed   By: Marin Olp M.D.   On: 09/22/2017 08:41   IMPRESSION AND PLAN:   *Acute pulmonary edema secondary to end-stage renal disease and fluid accumulation.  Patient will be admitted for inpatient dialysis. Discussed with nephrology Dr. Juleen China.  *Mild hyperkalemia.  Monitor on telemetry.  *Chest pain.  Troponin normal.  EKG no changes. Repeat troponin and monitor on telemetry.  *Hypertension.  Continue hydralazine.  *Diabetes mellitus.  Sliding scale insulin.  DVT prophylaxis with heparin.  All the records are reviewed and case discussed with ED provider. Management plans discussed with the patient, family and they are in agreement.  CODE STATUS: Full code  TOTAL TIME TAKING CARE OF THIS PATIENT: 40 minutes.   Neita Carp M.D on 09/22/2017 at 10:06 AM  Between 7am to  6pm - Pager - [phone]  After 6pm go to [email or web address] - password [email or web address] [facility]  Office  [phone]  CC: Primary care physician; System, Pcp Not In  Note: This dictation was prepared with Dragon dictation along with smaller phrase technology. Any transcriptional errors that result from this process are unintentional.

## 2017-09-22 NOTE — Progress Notes (Signed)
Pre HD assessment  

## 2017-09-22 NOTE — ED Notes (Signed)
Hospitalist at bedside 

## 2017-09-22 NOTE — Progress Notes (Signed)
Post HD assessment  

## 2017-09-22 NOTE — ED Notes (Signed)
Pt provided urine sample but insufficient sample for testing.  Pt states he will try again later

## 2017-09-22 NOTE — ED Triage Notes (Signed)
Patient presents to ED via POV from home with c/o SOB. Patient is a dialysis patient, last treatment was Sunday. Patient states his normal days are Monday, Wednesday, Friday. Patient states last night when he tried to lay flat he could not catch his breath. Patient able to speak in full complete sentences but does have some labored breathing noted.

## 2017-09-22 NOTE — Progress Notes (Signed)
HD tx start 

## 2017-09-22 NOTE — ED Provider Notes (Signed)
[facility] Emergency Department Provider Note   ____________________________________________   First MD Initiated Contact with Patient 09/22/17 [phone]     (approximate)  I have reviewed the triage vital signs and the nursing notes.   HISTORY  Chief Complaint Shortness of Breath    HPI Romie Tay is a 61 y.o. male who reports he had a little bit of shortness of breath last week but last night he got more short of breath he cannot lay flat without becoming very short of breath he also has a little bit of tightness in his chest and had some cramping in the left side of his neck earlier.  Tightness in his chest is still present.  Very mild.  Patient usually gets dialysis Monday Wednesday Friday but because its Christmas he got his dialysis on Sunday, 2 days ago.  He is not do dialysis again until tomorrow.  Past Medical History:  Diagnosis Date  . Diabetes mellitus without complication ([place])   . Dialysis patient [facility])    tuesday, thursday and saturday  . Gout   . Hypertension   . Kidney disease     Patient Active Problem List   Diagnosis Date Noted  . Viral gastroenteritis 10/04/2016  . Anemia 10/04/2016  . Thrombocytopenia ([place]) 10/04/2016  . Rectal bleeding 10/04/2016  . Hyperkalemia 10/02/2016  . Pulmonary edema 05/13/2016    Past Surgical History:  Procedure Laterality Date  . AV FISTULA PLACEMENT    . miscellaneous     peritoneal dialysis catheter placement and removal    Prior to Admission medications   Medication Sig Start Date End Date Taking? Authorizing Provider  calcitRIOL (ROCALTROL) 0.25 MCG capsule Take 0.25 mcg by mouth 4 (four) times a week.    Yes [provider]  calcium acetate (PHOSLO) 667 MG capsule Take 2,668 mg by mouth 3 (three) times daily with meals.    Yes [provider]  citalopram (CELEXA) 20 MG tablet Take 20 mg by mouth daily.    Yes [provider]  insulin aspart protamine- aspart  (NOVOLOG MIX 70/30) (70-30) 100 UNIT/ML injection Inject 10-12 Units into the skin 2 (two) times daily with a meal. 12 units in the morning and 10 units at night   Yes [provider]  hydrALAZINE (APRESOLINE) 25 MG tablet Take 1 tablet (25 mg total) by mouth every 8 (eight) hours. Patient not taking: Reported on 10/02/2016 05/14/16   Fritzi Mandes, MD  loperamide (IMODIUM) 2 MG capsule Take 1 capsule (2 mg total) by mouth every 6 (six) hours as needed for diarrhea or loose stools. Patient not taking: Reported on 09/22/2017 10/04/16   Theodoro Grist, MD  prochlorperazine (COMPAZINE) 5 MG tablet 1 tablet every 6 hours as needed for headache/nausea Patient not taking: Reported on 10/02/2016 02/20/16   Paulette Blanch, MD    Allergies Patient has no known allergies.  Family History  Problem Relation Age of Onset  . Diabetes Mother   . Prostate cancer Father   . Kidney disease Sister     Social History Social History   Tobacco Use  . Smoking status: Never Smoker  . Smokeless tobacco: Never Used  Substance Use Topics  . Alcohol use: No  . Drug use: No    Review of Systems  Constitutional: No fever/chills Eyes: No visual changes. ENT: No sore throat. Cardiovascular: See HPI Respiratory: See HPI Gastrointestinal: No abdominal pain.  No nausea, no vomiting.  No diarrhea.  No constipation. Genitourinary:  Negative for dysuria. Musculoskeletal: Negative for back pain. Skin: Negative for rash. Neurological: Negative for headaches, focal weakness  ____________________________________________   PHYSICAL EXAM:  VITAL SIGNS: ED Triage Vitals  Enc Vitals Group     BP 09/22/17 0805 (!) 165/67     Pulse Rate 09/22/17 0805 87     Resp 09/22/17 0805 (!) 21     Temp 09/22/17 0805 98.5 F (36.9 C)     Temp Source 09/22/17 0805 Oral     SpO2 09/22/17 0805 99 %     Weight 09/22/17 0804 207 lb 3.7 oz (94 kg)     Height 09/22/17 0804 5\' 8"  (1.727 m)     Head Circumference --      Peak  Flow --      Pain Score 09/22/17 0812 4     Pain Loc --      Pain Edu? --      Excl. in [place]? --     Constitutional: Alert and oriented. Well appearing and in no acute distress. Eyes: Conjunctivae are normal. Head: Atraumatic. Nose: No congestion/rhinnorhea. Mouth/Throat: Mucous membranes are moist.  Oropharynx non-erythematous. Neck: No stridor.  Cardiovascular: Normal rate, regular rhythm. Grossly normal heart sounds.  Good peripheral circulation. Respiratory: Normal respiratory effort.  No retractions. Lungs CTAB! Gastrointestinal: Soft and nontender. No distention. No abdominal bruits. No CVA tenderness. }Musculoskeletal: No lower extremity tenderness nor edema.  No joint effusions. Neurologic:  Normal speech and language. No gross focal neurologic deficits are appreciated. No gait instability. Skin:  Skin is warm, dry and intact. No rash noted. Psychiatric: Mood and affect are normal. Speech and behavior are normal.  ____________________________________________   LABS (all labs ordered are listed, but only abnormal results are displayed)  Labs Reviewed  TROPONIN I - Abnormal; Notable for the following components:      Result Value   Troponin I 0.03 (*)    All other components within normal limits  COMPREHENSIVE METABOLIC PANEL - Abnormal; Notable for the following components:   Sodium 133 (*)    Potassium 5.3 (*)    Chloride 96 (*)    Glucose, Bld 111 (*)    BUN 46 (*)    Creatinine, Ser 9.28 (*)    ALT 11 (*)    GFR calc non Af Amer 5 (*)    GFR calc Af Amer 6 (*)    All other components within normal limits  CBC WITH DIFFERENTIAL/PLATELET - Abnormal; Notable for the following components:   RBC 2.72 (*)    Hemoglobin 8.8 (*)    HCT 25.7 (*)    All other components within normal limits  BRAIN NATRIURETIC PEPTIDE - Abnormal; Notable for the following components:   B Natriuretic Peptide 1,416.0 (*)    All other components within normal limits  URINALYSIS, COMPLETE  (UACMP) WITH MICROSCOPIC   ____________________________________________  EKG  EKG read and interpreted by me shows normal sinus rhythm rate of Axis has flipped T's in 1 and L with flipped he is in one is new compared to previous EKG this is available T and L is slightly deeper than previously additionally his QRS is slightly wider than previously ____________________________________________  RADIOLOGY  X-ray looks like there might be some extra fluid to me. ____________________________________________   PROCEDURES  Procedure(s) performed:  Procedures  Critical Care performed:   ____________________________________________   INITIAL IMPRESSION / ASSESSMENT AND PLAN / ED COURSE  Patient is short of breath when he lays down he has some new  EKG changes potassium is up a little bit we will get him in the hospital and dialyze him today.  Discussed with both renal and hospitalist service      ____________________________________________   FINAL CLINICAL IMPRESSION(S) / ED DIAGNOSES  Final diagnoses:  SOB (shortness of breath)  Systolic congestive heart failure, unspecified HF chronicity [facility])     ED Discharge Orders    None       Note:  This document was prepared using Dragon voice recognition software and may include unintentional dictation errors.    Nena Polio, MD 09/22/17 [phone]

## 2017-09-22 NOTE — Progress Notes (Signed)
HD tx end  

## 2017-09-22 NOTE — Progress Notes (Signed)
[facility]  ROUNDING NOTE   Subjective:   [name] admitted to [facility] on 09/22/2017 for Dialysis pt diff breathing  Wife at bedside.   States last hemodialysis treatment was Sunday. Patient presents with shortness of breath and is asking for dialysis.   Objective:  Vital signs in last 24 hours:  Temp:  [98.4 F (36.9 C)-98.5 F (36.9 C)] 98.4 F (36.9 C) (12/25 0812) Pulse Rate:  [74-87] 74 (12/25 1100) Resp:  [9-21] 14 (12/25 1100) BP: (143-171)/(61-87) 143/65 (12/25 1100) SpO2:  [96 %-100 %] 97 % (12/25 1100) Weight:  [94 kg (207 lb 3.7 oz)] 94 kg (207 lb 3.7 oz) (12/25 0804)  Weight change:  Filed Weights   09/22/17 0804  Weight: 94 kg (207 lb 3.7 oz)    Intake/Output: No intake/output data recorded.   Intake/Output this shift:  No intake/output data recorded.  Physical Exam: General: NAD, laying in bed  Head: Normocephalic, atraumatic. Moist oral mucosal membranes  Eyes: Anicteric, PERRL  Neck: Supple, trachea midline  Lungs:  Bilateral crackles  Heart: Regular rate and rhythm  Abdomen:  Soft, nontender,   Extremities:  no peripheral edema.  Neurologic: Nonfocal, moving all four extremities  Skin: No lesions  Access: Left arm AVF    Basic Metabolic Panel: Recent Labs  Lab 09/22/17 0814  NA 133*  K 5.3*  CL 96*  CO2 27  GLUCOSE 111*  BUN 46*  CREATININE 9.28*  CALCIUM 9.0    Liver Function Tests: Recent Labs  Lab 09/22/17 0814  AST 23  ALT 11*  ALKPHOS 93  BILITOT 1.0  PROT 7.6  ALBUMIN 3.7   No results for input(s): LIPASE, AMYLASE in the last 168 hours. No results for input(s): AMMONIA in the last 168 hours.  CBC: Recent Labs  Lab 09/22/17 0814  WBC 8.1  NEUTROABS 5.9  HGB 8.8*  HCT 25.7*  MCV 94.4  PLT 169    Cardiac Enzymes: Recent Labs  Lab 09/22/17 0814  TROPONINI 0.03*    BNP: Invalid input(s): POCBNP  CBG: No results for input(s): GLUCAP in the last 168 hours.  Microbiology: Results  for orders placed or performed during the hospital encounter of 10/02/16  Gastrointestinal Panel by PCR , Stool     Status: Abnormal   Collection Time: 10/02/16  9:55 AM  Result Value Ref Range Status   Campylobacter species NOT DETECTED NOT DETECTED Final   Plesimonas shigelloides NOT DETECTED NOT DETECTED Final   Salmonella species NOT DETECTED NOT DETECTED Final   Yersinia enterocolitica NOT DETECTED NOT DETECTED Final   Vibrio species NOT DETECTED NOT DETECTED Final   Vibrio cholerae NOT DETECTED NOT DETECTED Final   Enteroaggregative E coli (EAEC) NOT DETECTED NOT DETECTED Final   Enteropathogenic E coli (EPEC) NOT DETECTED NOT DETECTED Final   Enterotoxigenic E coli (ETEC) NOT DETECTED NOT DETECTED Final   Shiga like toxin producing E coli (STEC) NOT DETECTED NOT DETECTED Final   Shigella/Enteroinvasive E coli (EIEC) NOT DETECTED NOT DETECTED Final   Cryptosporidium NOT DETECTED NOT DETECTED Final   Cyclospora cayetanensis NOT DETECTED NOT DETECTED Final   Entamoeba histolytica NOT DETECTED NOT DETECTED Final   Giardia lamblia NOT DETECTED NOT DETECTED Final   Adenovirus F40/41 NOT DETECTED NOT DETECTED Final   Astrovirus NOT DETECTED NOT DETECTED Final   Norovirus GI/GII NOT DETECTED NOT DETECTED Final   Rotavirus A NOT DETECTED NOT DETECTED Final   Sapovirus (I, II, IV, and V) DETECTED (A) NOT  DETECTED Final  C difficile quick scan w PCR reflex     Status: None   Collection Time: 10/02/16  9:55 AM  Result Value Ref Range Status   C Diff antigen NEGATIVE NEGATIVE Final   C Diff toxin NEGATIVE NEGATIVE Final   C Diff interpretation No C. difficile detected.  Final    Coagulation Studies: No results for input(s): LABPROT, INR in the last 72 hours.  Urinalysis: No results for input(s): COLORURINE, LABSPEC, PHURINE, GLUCOSEU, HGBUR, BILIRUBINUR, KETONESUR, PROTEINUR, UROBILINOGEN, NITRITE, LEUKOCYTESUR in the last 72 hours.  Invalid input(s): APPERANCEUR    Imaging: Dg  Chest Portable 1 View  Result Date: 09/22/2017 CLINICAL DATA:  Shortness of breath since last night. EXAM: PORTABLE CHEST 1 VIEW COMPARISON:  05/13/2016 FINDINGS: Lungs are adequately inflated without focal airspace consolidation or effusion. There is stable hazy prominence of the central perihilar vessels suggesting vascular congestion. Mild stable cardiomegaly. Remainder of the exam is unchanged. IMPRESSION: Mild stable cardiomegaly with mild stable vascular congestion. Electronically Signed   By: Marin Olp M.D.   On: 09/22/2017 08:41     Medications:    . [START ON 09/23/2017] aspirin EC  81 mg Oral Daily  . heparin  5,000 Units Subcutaneous Q8H  . insulin aspart  0-5 Units Subcutaneous QHS  . insulin aspart  0-9 Units Subcutaneous TID WC  . sodium chloride flush  3 mL Intravenous Q12H   acetaminophen **OR** acetaminophen, albuterol, ondansetron **OR** ondansetron (ZOFRAN) IV, traMADol  Assessment/ Plan:  [name] is a 61 y.o. Hispanic male with end stage renal disease on hemodialysis, insulin dependent diabetes mellitus type 2, gout, hypertension, anemia of chronic kidney disease, secondary hyperparathyroidism, depression  MWF [facility] [place] Left AVF  1. End-stage renal disease with hyperkalemia: on hemodialysis. With pulmonary edema on examination - Dialysis orders placed. Hemodialysis treatment for today.   2. Hypertension: blood pressure mildly elevated, volume driven - home regimen of hydralazine  3. Anemia of chronic kidney disease: hemoglobin 8.8. Normocytic - Mircera as outpatient.   4. Secondary Hyperparathyroidism:  - calcitriol daily - calcium acetate with meals.    LOS: 0 Danny [name] 12/25/201811:04 AM

## 2017-09-23 LAB — URINALYSIS, COMPLETE (UACMP) WITH MICROSCOPIC
Bacteria, UA: NONE SEEN
Bilirubin Urine: NEGATIVE
GLUCOSE, UA: 150 mg/dL — AB
Hgb urine dipstick: NEGATIVE
KETONES UR: NEGATIVE mg/dL
LEUKOCYTES UA: NEGATIVE
NITRITE: NEGATIVE
PH: 9 — AB (ref 5.0–8.0)
Protein, ur: 100 mg/dL — AB
SPECIFIC GRAVITY, URINE: 1.008 (ref 1.005–1.030)

## 2017-09-23 LAB — CBC
HEMATOCRIT: 25.4 % — AB (ref 40.0–52.0)
HEMOGLOBIN: 8.9 g/dL — AB (ref 13.0–18.0)
MCH: 32.8 pg (ref 26.0–34.0)
MCHC: 35 g/dL (ref 32.0–36.0)
MCV: 93.9 fL (ref 80.0–100.0)
Platelets: 154 10*3/uL (ref 150–440)
RBC: 2.7 MIL/uL — AB (ref 4.40–5.90)
RDW: 12.7 % (ref 11.5–14.5)
WBC: 6.3 10*3/uL (ref 3.8–10.6)

## 2017-09-23 LAB — BASIC METABOLIC PANEL
ANION GAP: 10 (ref 5–15)
BUN: 34 mg/dL — ABNORMAL HIGH (ref 6–20)
CO2: 30 mmol/L (ref 22–32)
Calcium: 8.7 mg/dL — ABNORMAL LOW (ref 8.9–10.3)
Chloride: 94 mmol/L — ABNORMAL LOW (ref 101–111)
Creatinine, Ser: 6.17 mg/dL — ABNORMAL HIGH (ref 0.61–1.24)
GFR, EST AFRICAN AMERICAN: 10 mL/min — AB (ref >60)
GFR, EST NON AFRICAN AMERICAN: 9 mL/min — AB (ref >60)
GLUCOSE: 144 mg/dL — AB (ref 65–99)
POTASSIUM: 4.8 mmol/L (ref 3.5–5.1)
Sodium: 134 mmol/L — ABNORMAL LOW (ref 135–145)

## 2017-09-23 LAB — GLUCOSE, CAPILLARY
GLUCOSE-CAPILLARY: 88 mg/dL (ref 65–99)
Glucose-Capillary: 124 mg/dL — ABNORMAL HIGH (ref 65–99)
Glucose-Capillary: 209 mg/dL — ABNORMAL HIGH (ref 65–99)

## 2017-09-23 LAB — HEPATITIS B SURFACE ANTIBODY,QUALITATIVE: Hep B S Ab: NONREACTIVE

## 2017-09-23 LAB — HEPATITIS B CORE ANTIBODY, TOTAL: Hep B Core Total Ab: NEGATIVE

## 2017-09-23 LAB — HEPATITIS B SURFACE ANTIGEN: HEP B S AG: NEGATIVE

## 2017-09-23 NOTE — Progress Notes (Signed)
Nitro patch removed at this time.

## 2017-09-23 NOTE — Discharge Instructions (Signed)
Renal diet  Activity as tolerated  Resume dialysis as per previous schedule

## 2017-09-23 NOTE — Progress Notes (Signed)
Dialysis Pre-Assessment

## 2017-09-23 NOTE — Progress Notes (Signed)
Danny [name]  A and O x 4. VSS. Pt tolerating diet well. No complaints of pain or nausea. IV removed intact, no new prescriptions given. Pt voiced understanding of discharge instructions with no further questions and interpreter used. Pt discharged via wheelchair with RN.  Lynann Bologna MSN, RN-BC  Allergies as of 09/23/2017   No Known Allergies     Medication List    STOP taking these medications   prochlorperazine 5 MG tablet Commonly known as:  COMPAZINE     TAKE these medications   calcitRIOL 0.25 MCG capsule Commonly known as:  ROCALTROL Take 0.25 mcg by mouth 4 (four) times a week.   calcium acetate 667 MG capsule Commonly known as:  PHOSLO Take 2,668 mg by mouth 3 (three) times daily with meals.   citalopram 20 MG tablet Commonly known as:  CELEXA Take 20 mg by mouth daily.   hydrALAZINE 25 MG tablet Commonly known as:  APRESOLINE Take 1 tablet (25 mg total) by mouth every 8 (eight) hours.   insulin aspart protamine- aspart (70-30) 100 UNIT/ML injection Commonly known as:  NOVOLOG MIX 70/30 Inject 10-12 Units into the skin 2 (two) times daily with a meal. 12 units in the morning and 10 units at night   loperamide 2 MG capsule Commonly known as:  IMODIUM Take 1 capsule (2 mg total) by mouth every 6 (six) hours as needed for diarrhea or loose stools.       Vitals:   09/23/17 1800 09/23/17 1845  BP:  (!) 166/59  Pulse:  91  Resp: (!) 21 18  Temp:    SpO2:  99%

## 2017-09-23 NOTE — Progress Notes (Signed)
[facility]  ROUNDING NOTE   Subjective:   [name] admitted to [facility] on 09/22/2017 for SOB (shortness of breath) [M76.72] Systolic congestive heart failure, unspecified HF chronicity ([place]) [I50.20]  Wife at bedside.  Feels well now.   3 L removed with HD No SOB  Objective:  Vital signs in last 24 hours:  Temp:  [98.3 F (36.8 C)-98.6 F (37 C)] 98.4 F (36.9 C) (12/26 0446) Pulse Rate:  [66-82] 81 (12/26 0446) Resp:  [11-18] 18 (12/25 1954) BP: (144-169)/(57-82) 163/73 (12/26 0446) SpO2:  [96 %-100 %] 97 % (12/26 0446) Weight:  [96.3 kg (212 lb 4.9 oz)-97 kg (213 lb 13.5 oz)] 96.3 kg (212 lb 4.9 oz) (12/26 0500)  Weight change:  Filed Weights   09/22/17 1300 09/22/17 1701 09/23/17 0500  Weight: 100.1 kg (220 lb 10.9 oz) 97 kg (213 lb 13.5 oz) 96.3 kg (212 lb 4.9 oz)    Intake/Output: I/O last 3 completed shifts: In: -  Out: 3020 [Other:3020]   Intake/Output this shift:  Total I/O In: 240 [P.O.:240] Out: 0   Physical Exam: General: NAD, sitting up in bed  Head: Normocephalic, atraumatic. Moist oral mucosal membranes  Eyes: Anicteric,    Neck: Supple, trachea midline  Lungs:  Clear to auscultation  Heart: Regular rate and rhythm  Abdomen:  Soft, nontender,   Extremities: trace peripheral edema.  Neurologic: Nonfocal, moving all four extremities  Skin: No lesions  Access: Left arm AVF    Basic Metabolic Panel: Recent Labs  Lab 09/22/17 0814 09/23/17 0426  NA 133* 134*  K 5.3* 4.8  CL 96* 94*  CO2 27 30  GLUCOSE 111* 144*  BUN 46* 34*  CREATININE 9.28* 6.17*  CALCIUM 9.0 8.7*    Liver Function Tests: Recent Labs  Lab 09/22/17 0814  AST 23  ALT 11*  ALKPHOS 93  BILITOT 1.0  PROT 7.6  ALBUMIN 3.7   No results for input(s): LIPASE, AMYLASE in the last 168 hours. No results for input(s): AMMONIA in the last 168 hours.  CBC: Recent Labs  Lab 09/22/17 0814 09/23/17 0426  WBC 8.1 6.3  NEUTROABS 5.9  --   HGB 8.8*  8.9*  HCT 25.7* 25.4*  MCV 94.4 93.9  PLT 169 154    Cardiac Enzymes: Recent Labs  Lab 09/22/17 0814 09/22/17 1238 09/22/17 1957  TROPONINI 0.03* 0.03* <0.03    BNP: Invalid input(s): POCBNP  CBG: Recent Labs  Lab 09/22/17 1213 09/22/17 1845 09/22/17 2115 09/23/17 0731 09/23/17 1117  GLUCAP 145* 207* 253* 124* 209*    Microbiology: Results for orders placed or performed during the hospital encounter of 09/22/17  MRSA PCR Screening     Status: None   Collection Time: 09/22/17  6:49 PM  Result Value Ref Range Status   MRSA by PCR NEGATIVE NEGATIVE Final    Comment:        The GeneXpert MRSA Assay (FDA approved for NASAL specimens only), is one component of a comprehensive MRSA colonization surveillance program. It is not intended to diagnose MRSA infection nor to guide or monitor treatment for MRSA infections. Performed at [facility], [address] [place], [place] [address]     Coagulation Studies: No results for input(s): LABPROT, INR in the last 72 hours.  Urinalysis: Recent Labs    09/22/17 1006  COLORURINE YELLOW*  LABSPEC 1.008  PHURINE 9.0*  GLUCOSEU 150*  HGBUR NEGATIVE  BILIRUBINUR NEGATIVE  KETONESUR NEGATIVE  PROTEINUR 100*  NITRITE  NEGATIVE  LEUKOCYTESUR NEGATIVE      Imaging: Dg Chest Portable 1 View  Result Date: 09/22/2017 CLINICAL DATA:  Shortness of breath since last night. EXAM: PORTABLE CHEST 1 VIEW COMPARISON:  05/13/2016 FINDINGS: Lungs are adequately inflated without focal airspace consolidation or effusion. There is stable hazy prominence of the central perihilar vessels suggesting vascular congestion. Mild stable cardiomegaly. Remainder of the exam is unchanged. IMPRESSION: Mild stable cardiomegaly with mild stable vascular congestion. Electronically Signed   By: Marin Olp M.D.   On: 09/22/2017 08:41     Medications:    . aspirin EC  81 mg Oral Daily  . calcitRIOL  0.25 mcg Oral Daily  . calcium  acetate  2,001 mg Oral TID WC  . heparin  5,000 Units Subcutaneous Q8H  . insulin aspart  0-5 Units Subcutaneous QHS  . insulin aspart  0-9 Units Subcutaneous TID WC  . sodium chloride flush  3 mL Intravenous Q12H   acetaminophen **OR** acetaminophen, albuterol, ondansetron **OR** ondansetron (ZOFRAN) IV, traMADol  Assessment/ Plan:  [name] is a 61 y.o. Hispanic male with end stage renal disease on hemodialysis, insulin dependent diabetes mellitus type 2, gout, hypertension, anemia of chronic kidney disease, secondary hyperparathyroidism, depression  MWF [facility] [place] Left AVF  1. Acute Pulm edema in setting of End-stage renal disease with hyperkalemia: on hemodialysis.   Acute Pulm edema has resolved after 3 L of fluid was removed - routine HD today as patient missed his spot as outpatient this morning  2.  Anemia of chronic kidney disease: hemoglobin 8.8. Normocytic - Mircera as outpatient.   3. Secondary Hyperparathyroidism:  - calcitriol daily - calcium acetate with meals.    LOS: 0 [name] 12/26/20181:06 PM

## 2017-09-23 NOTE — Progress Notes (Signed)
Okay per Dr. Darvin Neighbours to leave meds in signed and held as pt is to discharge. Also okay per MD to take off nitro patch.

## 2017-09-23 NOTE — Progress Notes (Signed)
Post hemodialysis assessement

## 2017-09-23 NOTE — Progress Notes (Signed)
Pre-Hemodialysis Assessment

## 2017-09-23 NOTE — Progress Notes (Signed)
Post treatment assessment data

## 2017-09-23 NOTE — Progress Notes (Signed)
Pre Hemodialysis assessmenr

## 2017-09-28 ENCOUNTER — Ambulatory Visit: Admission: RE | Admit: 2017-09-28 | Discharge: 2017-09-28

## 2017-10-05 NOTE — Discharge Summary (Signed)
[facility] [place] NAME: Danny [name]    MR#:  [id number]  DATE OF BIRTH:  23-May-1956  DATE OF ADMISSION:  09/22/2017 ADMITTING PHYSICIAN: Hillary Bow, MD  DATE OF DISCHARGE: 09/23/2017  7:19 PM  PRIMARY CARE PHYSICIAN: System, Pcp Not In   ADMISSION DIAGNOSIS:  SOB (shortness of breath) [E75.17] Systolic congestive heart failure, unspecified HF chronicity ([place]) [I50.20]  DISCHARGE DIAGNOSIS:  Active Problems:   Pulmonary edema   SECONDARY DIAGNOSIS:   Past Medical History:  Diagnosis Date  . Diabetes mellitus without complication ([place])   . Dialysis patient [facility])    tuesday, thursday and saturday  . Gout   . Hypertension   . Kidney disease      ADMITTING HISTORY  HISTORY OF PRESENT ILLNESS:  Danny [name]  is a 62 y.o. male with a known history of end-stage renal disease, diabetes, hypertension, chronic anemia presents to the hospital complaining of orthopnea and some chest tightness.  Patient has had his dialysis on Sunday is and due again on Wednesday.  He does not make any urine.  Here in the emergency room he continues to have orthopnea although his saturations are normal.  Mild elevation in potassium to 5.3.  Chest x-ray showing pulmonary edema.  Troponin normal.  Patient is being admitted for inpatient dialysis due to symptomatic pulmonary edema secondary to end-stage renal disease. Discussed case with Dr. Rip Harbour and Dr. Juleen China.     HOSPITAL COURSE:   *Acute pulmonary edema secondary to end-stage renal disease and fluid accumulation.  Patient will be admitted for inpatient dialysis. Discussed with nephrology Dr. Juleen China. Not CHF Improved well and quickly with HD in the hospital. Discussed with nephrology on day of discharge and will discharge patient home to continue OP HD.  *Mild hyperkalemia.  Monitored on telemetry. resolved  *Chest pain.  Troponin normal.  EKG no changes. Repeat troponin  normal  *Hypertension.  Continue hydralazine.  *Diabetes mellitus.  Sliding scale insulin.  Stable for discharge home    CONSULTS OBTAINED:    DRUG ALLERGIES:  No Known Allergies  DISCHARGE MEDICATIONS:   Allergies as of 09/23/2017   No Known Allergies     Medication List    STOP taking these medications   prochlorperazine 5 MG tablet Commonly known as:  COMPAZINE     TAKE these medications   calcitRIOL 0.25 MCG capsule Commonly known as:  ROCALTROL Take 0.25 mcg by mouth 4 (four) times a week.   calcium acetate 667 MG capsule Commonly known as:  PHOSLO Take 2,668 mg by mouth 3 (three) times daily with meals.   citalopram 20 MG tablet Commonly known as:  CELEXA Take 20 mg by mouth daily.   hydrALAZINE 25 MG tablet Commonly known as:  APRESOLINE Take 1 tablet (25 mg total) by mouth every 8 (eight) hours.   insulin aspart protamine- aspart (70-30) 100 UNIT/ML injection Commonly known as:  NOVOLOG MIX 70/30 Inject 10-12 Units into the skin 2 (two) times daily with a meal. 12 units in the morning and 10 units at night   loperamide 2 MG capsule Commonly known as:  IMODIUM Take 1 capsule (2 mg total) by mouth every 6 (six) hours as needed for diarrhea or loose stools.       Today   VITAL SIGNS:  Blood pressure (!) 166/59, pulse 91, temperature 98.1 F (36.7 C), temperature source Oral, resp. rate 18, height 5\' 8"  (1.727 m), weight 99 kg (218  lb 4.1 oz), SpO2 99 %.  I/O:  No intake or output data in the 24 hours ending 10/05/17 1559  PHYSICAL EXAMINATION:  Physical Exam  GENERAL:  62 y.o.-year-old patient lying in the bed with no acute distress.  LUNGS: Normal breath sounds bilaterally, no wheezing, rales,rhonchi or crepitation. No use of accessory muscles of respiration.  CARDIOVASCULAR: S1, S2 normal. No murmurs, rubs, or gallops.  ABDOMEN: Soft, non-tender, non-distended. Bowel sounds present. No organomegaly or mass.  NEUROLOGIC: Moves all 4  extremities. PSYCHIATRIC: The patient is alert and oriented x 3.  SKIN: No obvious rash, lesion, or ulcer.   DATA REVIEW:   CBC No results for input(s): WBC, HGB, HCT, PLT in the last 168 hours.  Chemistries  No results for input(s): NA, K, CL, CO2, GLUCOSE, BUN, CREATININE, CALCIUM, MG, AST, ALT, ALKPHOS, BILITOT in the last 168 hours.  Invalid input(s): GFRCGP  Cardiac Enzymes No results for input(s): TROPONINI in the last 168 hours.  Microbiology Results  Results for orders placed or performed during the hospital encounter of 09/22/17  MRSA PCR Screening     Status: None   Collection Time: 09/22/17  6:49 PM  Result Value Ref Range Status   MRSA by PCR NEGATIVE NEGATIVE Final    Comment:        The GeneXpert MRSA Assay (FDA approved for NASAL specimens only), is one component of a comprehensive MRSA colonization surveillance program. It is not intended to diagnose MRSA infection nor to guide or monitor treatment for MRSA infections. Performed at [facility], [address] [place],  [address]     RADIOLOGY:  No results found.  Follow up with PCP in 1 week.  Management plans discussed with the patient, family and they are in agreement.  CODE STATUS:  Code Status History    Date Active Date Inactive Code Status Order ID Comments User Context   09/22/2017 10:05 09/23/2017 22:24 Full Code [id number]  Hillary Bow, MD ED   10/02/2016 12:03 10/04/2016 21:07 Full Code [id number]  Epifanio Lesches, MD ED   05/13/2016 19:54 05/14/2016 18:23 Full Code [id number]  Gladstone Lighter, MD Inpatient      TOTAL TIME TAKING CARE OF THIS PATIENT ON DAY OF DISCHARGE: more than 30 minutes.   Leia Alf Reyansh Kushnir M.D on 10/05/2017 at 3:59 PM  Between 7am to 6pm - Pager - [phone]  After 6pm go to [email or web address] - password [email or web address] [facility]  Office  [phone]  CC: Primary care physician; System, Pcp Not In  Note: This dictation was  prepared with Dragon dictation along with smaller phrase technology. Any transcriptional errors that result from this process are unintentional.

## 2017-10-19 MED ORDER — CODEINE 10 MG-GUAIFENESIN 100 MG/5 ML ORAL LIQUID
Freq: Two times a day (BID) | ORAL | 0 refills | 0.00000 days | Status: CP | PRN
Start: 2017-10-19 — End: ?

## 2017-10-29 ENCOUNTER — Ambulatory Visit: Admit: 2017-10-29 | Discharge: 2017-10-30

## 2017-11-26 ENCOUNTER — Ambulatory Visit: Admit: 2017-11-26 | Discharge: 2017-11-27

## 2017-12-27 ENCOUNTER — Ambulatory Visit: Admit: 2017-12-27 | Discharge: 2017-12-28

## 2018-01-08 MED ORDER — CALCIUM ACETATE(PHOSPHATE BINDERS) 667 MG CAPSULE: 2668 mg | capsule | Freq: Three times a day (TID) | 3 refills | 0 days | Status: AC

## 2018-01-08 MED ORDER — LISINOPRIL 20 MG TABLET
ORAL_TABLET | Freq: Every day | ORAL | 3 refills | 0.00000 days | Status: CP
Start: 2018-01-08 — End: 2018-10-06

## 2018-01-08 MED ORDER — CALCIUM ACETATE(PHOSPHATE BINDERS) 667 MG CAPSULE
ORAL_CAPSULE | Freq: Three times a day (TID) | ORAL | 7 refills | 0.00000 days | Status: CP
Start: 2018-01-08 — End: 2018-01-08

## 2018-01-13 MED ORDER — SEVELAMER HCL 800 MG TABLET
ORAL_TABLET | Freq: Three times a day (TID) | ORAL | 3 refills | 0 days | Status: CP
Start: 2018-01-13 — End: 2019-01-13

## 2018-01-13 MED ORDER — SEVELAMER CARBONATE 800 MG TABLET
ORAL_TABLET | 11 refills | 0 days
Start: 2018-01-13 — End: 2019-01-13

## 2018-01-26 ENCOUNTER — Ambulatory Visit: Admit: 2018-01-26 | Discharge: 2018-01-27

## 2018-01-28 MED ORDER — AMLODIPINE 5 MG TABLET
ORAL_TABLET | Freq: Every day | ORAL | 3 refills | 0.00000 days | Status: CP
Start: 2018-01-28 — End: 2018-01-28

## 2018-01-28 MED ORDER — AMLODIPINE 5 MG TABLET: 5 mg | tablet | Freq: Every day | 3 refills | 0 days | Status: AC

## 2018-01-28 MED ORDER — CITALOPRAM 20 MG TABLET
ORAL_TABLET | Freq: Every day | ORAL | 3 refills | 0.00000 days | Status: CP
Start: 2018-01-28 — End: 2018-08-17

## 2018-01-28 MED ORDER — CITALOPRAM 20 MG TABLET: tablet | 3 refills | 0 days

## 2018-02-03 ENCOUNTER — Other Ambulatory Visit: Payer: Self-pay

## 2018-02-03 ENCOUNTER — Encounter: Payer: Self-pay | Admitting: Emergency Medicine

## 2018-02-03 ENCOUNTER — Emergency Department
Admission: EM | Admit: 2018-02-03 | Discharge: 2018-02-03 | Disposition: A | Discharge status: Home / Self Care | Payer: Self-pay | Attending: Emergency Medicine | Admitting: Emergency Medicine

## 2018-02-03 ENCOUNTER — Emergency Department: Payer: Self-pay

## 2018-02-03 DIAGNOSIS — I129 Hypertensive chronic kidney disease with stage 1 through stage 4 chronic kidney disease, or unspecified chronic kidney disease: Secondary | ICD-10-CM | POA: Insufficient documentation

## 2018-02-03 DIAGNOSIS — J810 Acute pulmonary edema: Secondary | ICD-10-CM | POA: Insufficient documentation

## 2018-02-03 DIAGNOSIS — Z794 Long term (current) use of insulin: Secondary | ICD-10-CM | POA: Insufficient documentation

## 2018-02-03 DIAGNOSIS — R0789 Other chest pain: Secondary | ICD-10-CM | POA: Insufficient documentation

## 2018-02-03 DIAGNOSIS — E1122 Type 2 diabetes mellitus with diabetic chronic kidney disease: Secondary | ICD-10-CM | POA: Insufficient documentation

## 2018-02-03 DIAGNOSIS — Z992 Dependence on renal dialysis: Secondary | ICD-10-CM | POA: Insufficient documentation

## 2018-02-03 DIAGNOSIS — E875 Hyperkalemia: Secondary | ICD-10-CM | POA: Insufficient documentation

## 2018-02-03 DIAGNOSIS — R0602 Shortness of breath: Secondary | ICD-10-CM | POA: Insufficient documentation

## 2018-02-03 DIAGNOSIS — N189 Chronic kidney disease, unspecified: Secondary | ICD-10-CM | POA: Insufficient documentation

## 2018-02-03 DIAGNOSIS — Z79899 Other long term (current) drug therapy: Secondary | ICD-10-CM | POA: Insufficient documentation

## 2018-02-03 LAB — CBC WITH DIFFERENTIAL/PLATELET
BASOS ABS: 0 10*3/uL (ref 0–0.1)
BASOS PCT: 1 %
Eosinophils Absolute: 0.4 10*3/uL (ref 0–0.7)
Eosinophils Relative: 6 %
HCT: 34.5 % — ABNORMAL LOW (ref 40.0–52.0)
HEMOGLOBIN: 11.5 g/dL — AB (ref 13.0–18.0)
Lymphocytes Relative: 13 %
Lymphs Abs: 0.8 10*3/uL — ABNORMAL LOW (ref 1.0–3.6)
MCH: 30.9 pg (ref 26.0–34.0)
MCHC: 33.2 g/dL (ref 32.0–36.0)
MCV: 93 fL (ref 80.0–100.0)
MONOS PCT: 13 %
Monocytes Absolute: 0.8 10*3/uL (ref 0.2–1.0)
NEUTROS PCT: 67 %
Neutro Abs: 3.9 10*3/uL (ref 1.4–6.5)
Platelets: 118 10*3/uL — ABNORMAL LOW (ref 150–440)
RBC: 3.71 MIL/uL — ABNORMAL LOW (ref 4.40–5.90)
RDW: 16.8 % — AB (ref 11.5–14.5)
WBC: 5.9 10*3/uL (ref 3.8–10.6)

## 2018-02-03 LAB — COMPREHENSIVE METABOLIC PANEL
ALT: 37 U/L (ref 17–63)
ANION GAP: 11 (ref 5–15)
AST: 34 U/L (ref 15–41)
Albumin: 3.8 g/dL (ref 3.5–5.0)
Alkaline Phosphatase: 121 U/L (ref 38–126)
BILIRUBIN TOTAL: 0.8 mg/dL (ref 0.3–1.2)
BUN: 72 mg/dL — AB (ref 6–20)
CO2: 26 mmol/L (ref 22–32)
Calcium: 8.7 mg/dL — ABNORMAL LOW (ref 8.9–10.3)
Chloride: 96 mmol/L — ABNORMAL LOW (ref 101–111)
Creatinine, Ser: 8.29 mg/dL — ABNORMAL HIGH (ref 0.61–1.24)
GFR, EST AFRICAN AMERICAN: 7 mL/min — AB (ref >60)
GFR, EST NON AFRICAN AMERICAN: 6 mL/min — AB (ref >60)
Glucose, Bld: 164 mg/dL — ABNORMAL HIGH (ref 65–99)
POTASSIUM: 5.9 mmol/L — AB (ref 3.5–5.1)
Sodium: 133 mmol/L — ABNORMAL LOW (ref 135–145)
Total Protein: 7.4 g/dL (ref 6.5–8.1)

## 2018-02-03 LAB — TROPONIN I: TROPONIN I: 0.06 ng/mL — AB (ref <0.03)

## 2018-02-03 LAB — BRAIN NATRIURETIC PEPTIDE: B NATRIURETIC PEPTIDE 5: 3555 pg/mL — AB (ref 0.0–100.0)

## 2018-02-03 MED ORDER — NITROGLYCERIN 0.4 MG SL SUBL
0.4000 mg | SUBLINGUAL_TABLET | SUBLINGUAL | Status: DC | PRN
  Administered 2018-02-03: 0.4 mg via SUBLINGUAL

## 2018-02-03 MED ORDER — NITROGLYCERIN 0.4 MG SL SUBL
SUBLINGUAL_TABLET | SUBLINGUAL | Status: AC
  Filled 2018-02-03: qty 1

## 2018-02-03 MED ORDER — ENALAPRILAT 1.25 MG/ML IV SOLN
INTRAVENOUS | Status: AC
  Filled 2018-02-03: qty 2

## 2018-02-03 MED ORDER — ENALAPRILAT 1.25 MG/ML IV SOLN
1.2500 mg | Freq: Once | INTRAVENOUS | Status: AC
  Administered 2018-02-03: 1.25 mg via INTRAVENOUS

## 2018-02-03 NOTE — ED Provider Notes (Signed)
[facility] Emergency Department Provider Note  ____________________________________________   First MD Initiated Contact with Patient 02/03/18 0430     (approximate)  I have reviewed the triage vital signs and the nursing notes.   HISTORY  Chief Complaint Chest Pain   HPI Danny [name] is a 62 y.o. male who self presents to the emergency department with chest pain and shortness of breath that began yesterday evening.  He has a complex past medical history including diabetes mellitus, end-stage renal disease for which he receives hemodialysis via a graft in his left upper extremity Monday Wednesday Friday, and hypertension.  His pain is sharp diffuse upper chest.  Worse with deep inspiration somewhat improved with rest.  Somewhat worse when lying down.  He has never had pain like this before.  The pain is nonradiating.  He does have some leg swelling.  Past Medical History:  Diagnosis Date  . Diabetes mellitus without complication ([place])   . Dialysis patient [facility])    tuesday, thursday and saturday  . Gout   . Hypertension   . Kidney disease     Patient Active Problem List   Diagnosis Date Noted  . Viral gastroenteritis 10/04/2016  . Anemia 10/04/2016  . Thrombocytopenia ([place]) 10/04/2016  . Rectal bleeding 10/04/2016  . Hyperkalemia 10/02/2016  . Pulmonary edema 05/13/2016    Past Surgical History:  Procedure Laterality Date  . AV FISTULA PLACEMENT    . miscellaneous     peritoneal dialysis catheter placement and removal    Prior to Admission medications   Medication Sig Start Date End Date Taking? Authorizing Provider  calcitRIOL (ROCALTROL) 0.25 MCG capsule Take 0.25 mcg by mouth 4 (four) times a week.     [provider]  calcium acetate (PHOSLO) 667 MG capsule Take 2,668 mg by mouth 3 (three) times daily with meals.     [provider]  citalopram (CELEXA) 20 MG tablet Take 20 mg by mouth daily.     [provider]  hydrALAZINE (APRESOLINE) 25 MG tablet Take 1 tablet (25 mg total) by mouth every 8 (eight) hours. Patient not taking: Reported on 10/02/2016 05/14/16   Fritzi Mandes, MD  insulin aspart protamine- aspart (NOVOLOG MIX 70/30) (70-30) 100 UNIT/ML injection Inject 10-12 Units into the skin 2 (two) times daily with a meal. 12 units in the morning and 10 units at night    [provider]  loperamide (IMODIUM) 2 MG capsule Take 1 capsule (2 mg total) by mouth every 6 (six) hours as needed for diarrhea or loose stools. Patient not taking: Reported on 09/22/2017 10/04/16   Theodoro Grist, MD    Allergies Patient has no known allergies.  Family History  Problem Relation Age of Onset  . Diabetes Mother   . Prostate cancer Father   . Kidney disease Sister     Social History Social History   Tobacco Use  . Smoking status: Never Smoker  . Smokeless tobacco: Never Used  Substance Use Topics  . Alcohol use: No  . Drug use: No    Review of Systems Constitutional: No fever/chills Eyes: Positive for visual changes. ENT: No sore throat. Cardiovascular: Positive for chest pain. Respiratory: Positive for shortness of breath. Gastrointestinal: No abdominal pain.  No nausea, no vomiting.  No diarrhea.  No constipation. Genitourinary: Negative for dysuria. Musculoskeletal: Negative for back pain. Skin: Negative for rash. Neurological: Negative for headaches, focal weakness or numbness.   ____________________________________________   PHYSICAL EXAM:  VITAL SIGNS: ED Triage Vitals  Enc Vitals Group     BP --      Pulse --      Resp --      Temp --      Temp src --      SpO2 --      Weight 02/03/18 0423 212 lb 8.4 oz (96.4 kg)     Height 02/03/18 0423 5\' 7"  (1.702 m)     Head Circumference --      Peak Flow --      Pain Score 02/03/18 0425 10     Pain Loc --      Pain Edu? --      Excl. in [place]? --     Constitutional: Alert and oriented x4 appears uncomfortable nontoxic no  diaphoresis speaks of a clear sentences Eyes: PERRL EOMI. Head: Atraumatic. Nose: No congestion/rhinnorhea. Mouth/Throat: No trismus Neck: No stridor.  Uncomfortable when lying flat with significant JVD  Cardiovascular: Normal rate, regular rhythm. Grossly normal heart sounds.  Good peripheral circulation. Respiratory: Increased respiratory effort with crackles at bilateral bases Gastrointestinal: Soft nontender Musculoskeletal: Legs are equal in size 1+ pitting edema to midshin bilaterally Neurologic:  Normal speech and language. No gross focal neurologic deficits are appreciated. Skin:  Skin is warm, dry and intact. No rash noted. Psychiatric: Mood and affect are normal. Speech and behavior are normal.    ____________________________________________   DIFFERENTIAL includes but not limited to  Acute pulmonary edema, congestive heart failure, acute coronary syndrome, para cardial effusion, myocarditis, pulmonary embolism ____________________________________________   LABS (all labs ordered are listed, but only abnormal results are displayed)  Labs Reviewed  COMPREHENSIVE METABOLIC PANEL - Abnormal; Notable for the following components:      Result Value   Sodium 133 (*)    Potassium 5.9 (*)    Chloride 96 (*)    Glucose, Bld 164 (*)    BUN 72 (*)    Creatinine, Ser 8.29 (*)    Calcium 8.7 (*)    GFR calc non Af Amer 6 (*)    GFR calc Af Amer 7 (*)    All other components within normal limits  BRAIN NATRIURETIC PEPTIDE - Abnormal; Notable for the following components:   B Natriuretic Peptide 3,555.0 (*)    All other components within normal limits  TROPONIN I - Abnormal; Notable for the following components:   Troponin I 0.06 (*)    All other components within normal limits  CBC WITH DIFFERENTIAL/PLATELET - Abnormal; Notable for the following components:   RBC 3.71 (*)    Hemoglobin 11.5 (*)    HCT 34.5 (*)    RDW 16.8 (*)    Platelets 118 (*)    Lymphs Abs 0.8 (*)      All other components within normal limits    Lab work reviewed by me with a number of abnormalities.  BNP is significantly elevated along with troponin which likely is secondary to stretching demand and not primary myocardial ischemia.  Potassium is high at 5.9 but does not require emergent dialysis this very second __________________________________________  EKG ED ECG REPORT I, Darel Hong, the attending physician, personally viewed and interpreted this ECG.  Date: 02/03/2018 EKG Time:  Rate: 81 Rhythm: normal sinus rhythm QRS Axis: normal Intervals: normal ST/T Wave abnormalities: normal Narrative Interpretation: no evidence of acute ischemia  ____________________________________________  RADIOLOGY  Chest x-ray reviewed by me consistent with pulmonary edema ____________________________________________   PROCEDURES  Procedure(s) performed:  no  Procedures  Critical Care performed: o  Observation: no ____________________________________________   INITIAL IMPRESSION / ASSESSMENT AND PLAN / ED COURSE  Pertinent labs & imaging results that were available during my care of the patient were reviewed by me and considered in my medical decision making (see chart for details).  The patient arrives obviously short of breath and uncomfortable appearing.  Given a tablet of nitroglycerin and 1.25 mg of IV enalapril with some improvement in his symptoms.  First EKG is nonischemic.  Lab work and chest x-ray are pending.     ----------------------------------------- 5:40 AM on 02/03/2018 -----------------------------------------  The patient feels improved.  He has an appointment for dialysis in 20 minutes.  I had a lengthy discussion with the patient regarding the diagnostic uncertainty however I felt his symptoms are most consistent with acute pulmonary edema from needing dialysis.  He is now also reporting intermittent black spots in his vision particularly on the left  however given the urgency of getting to dialysis on time I will refer him to office in outpatient.  Strict return precautions given the patient verbalizes understanding agreement with the plan. ____________________________________________   FINAL CLINICAL IMPRESSION(S) / ED DIAGNOSES  Final diagnoses:  Atypical chest pain  Acute pulmonary edema (HCC)  Hyperkalemia      NEW MEDICATIONS STARTED DURING THIS VISIT:  New Prescriptions   No medications on file     Note:  This document was prepared using Dragon voice recognition software and may include unintentional dictation errors.     Darel Hong, MD 02/03/18 [phone]

## 2018-02-03 NOTE — ED Triage Notes (Signed)
Patient ambulatory to triage with steady gait, without difficulty or distress noted;pt reports awoke PTA with upper chest/abd pain accomp by [facility]; dialysis pt M-W-Fri; st hx of same and was told r/t dialysis

## 2018-02-03 NOTE — Discharge Instructions (Signed)
Today your chest x-ray and your blood work show that you have too much fluid on your lungs.  Please go directly to dialysis and complete a full round.  Return to the emergency department at any point for any concerns whatsoever.  It was a pleasure to take care of you today, and thank you for coming to our emergency department.  If you have any questions or concerns before leaving please ask the nurse to grab me and I'm more than happy to go through your aftercare instructions again.  If you were prescribed any opioid pain medication today such as Norco, Vicodin, Percocet, morphine, hydrocodone, or oxycodone please make sure you do not drive when you are taking this medication as it can alter your ability to drive safely.  If you have any concerns once you are home that you are not improving or are in fact getting worse before you can make it to your follow-up appointment, please do not hesitate to call [phone] and come back for further evaluation.  Darel Hong, MD  Results for orders placed or performed during the hospital encounter of 02/03/18  Comprehensive metabolic panel  Result Value Ref Range   Sodium 133 (L) 135 - 145 mmol/L   Potassium 5.9 (H) 3.5 - 5.1 mmol/L   Chloride 96 (L) 101 - 111 mmol/L   CO2 26 22 - 32 mmol/L   Glucose, Bld 164 (H) 65 - 99 mg/dL   BUN 72 (H) 6 - 20 mg/dL   Creatinine, Ser 8.29 (H) 0.61 - 1.24 mg/dL   Calcium 8.7 (L) 8.9 - 10.3 mg/dL   Total Protein 7.4 6.5 - 8.1 g/dL   Albumin 3.8 3.5 - 5.0 g/dL   AST 34 15 - 41 U/L   ALT 37 17 - 63 U/L   Alkaline Phosphatase 121 38 - 126 U/L   Total Bilirubin 0.8 0.3 - 1.2 mg/dL   GFR calc non Af Amer 6 (L) >60 mL/min   GFR calc Af Amer 7 (L) >60 mL/min   Anion gap 11 5 - 15  Brain natriuretic peptide  Result Value Ref Range   B Natriuretic Peptide 3,555.0 (H) 0.0 - 100.0 pg/mL  Troponin I  Result Value Ref Range   Troponin I 0.06 (HH) <0.03 ng/mL  CBC with Differential  Result Value Ref Range   WBC 5.9 3.8 -  10.6 K/uL   RBC 3.71 (L) 4.40 - 5.90 MIL/uL   Hemoglobin 11.5 (L) 13.0 - 18.0 g/dL   HCT 34.5 (L) 40.0 - 52.0 %   MCV 93.0 80.0 - 100.0 fL   MCH 30.9 26.0 - 34.0 pg   MCHC 33.2 32.0 - 36.0 g/dL   RDW 16.8 (H) 11.5 - 14.5 %   Platelets 118 (L) 150 - 440 K/uL   Neutrophils Relative % 67 %   Neutro Abs 3.9 1.4 - 6.5 K/uL   Lymphocytes Relative 13 %   Lymphs Abs 0.8 (L) 1.0 - 3.6 K/uL   Monocytes Relative 13 %   Monocytes Absolute 0.8 0.2 - 1.0 K/uL   Eosinophils Relative 6 %   Eosinophils Absolute 0.4 0 - 0.7 K/uL   Basophils Relative 1 %   Basophils Absolute 0.0 0 - 0.1 K/uL

## 2018-02-04 MED ORDER — INSULIN ASPAR PROT-INSULIN ASPART 100 UNIT/ML (70-30) SUBCUTANEOUS PEN: mL | 0 refills | 0 days | Status: AC

## 2018-02-04 MED ORDER — INSULIN ASPAR PROT-INSULIN ASPART 100 UNIT/ML (70-30) SUBCUTANEOUS PEN
0 refills | 0.00000 days | Status: CP
Start: 2018-02-04 — End: 2018-11-17

## 2018-02-05 ENCOUNTER — Emergency Department
Admission: EM | Admit: 2018-02-05 | Discharge: 2018-02-05 | Disposition: A | Discharge status: Home / Self Care | Payer: Self-pay | Attending: Emergency Medicine | Admitting: Emergency Medicine

## 2018-02-05 ENCOUNTER — Encounter: Payer: Self-pay | Admitting: Emergency Medicine

## 2018-02-05 ENCOUNTER — Emergency Department: Payer: Self-pay

## 2018-02-05 DIAGNOSIS — R509 Fever, unspecified: Secondary | ICD-10-CM | POA: Insufficient documentation

## 2018-02-05 DIAGNOSIS — E1122 Type 2 diabetes mellitus with diabetic chronic kidney disease: Secondary | ICD-10-CM | POA: Insufficient documentation

## 2018-02-05 DIAGNOSIS — N189 Chronic kidney disease, unspecified: Secondary | ICD-10-CM | POA: Insufficient documentation

## 2018-02-05 DIAGNOSIS — J40 Bronchitis, not specified as acute or chronic: Secondary | ICD-10-CM | POA: Insufficient documentation

## 2018-02-05 DIAGNOSIS — I509 Heart failure, unspecified: Secondary | ICD-10-CM | POA: Insufficient documentation

## 2018-02-05 DIAGNOSIS — Z794 Long term (current) use of insulin: Secondary | ICD-10-CM | POA: Insufficient documentation

## 2018-02-05 DIAGNOSIS — Z992 Dependence on renal dialysis: Secondary | ICD-10-CM | POA: Insufficient documentation

## 2018-02-05 DIAGNOSIS — Z79899 Other long term (current) drug therapy: Secondary | ICD-10-CM | POA: Insufficient documentation

## 2018-02-05 DIAGNOSIS — I130 Hypertensive heart and chronic kidney disease with heart failure and stage 1 through stage 4 chronic kidney disease, or unspecified chronic kidney disease: Secondary | ICD-10-CM | POA: Insufficient documentation

## 2018-02-05 MED ORDER — AZITHROMYCIN 250 MG PO TABS: ORAL_TABLET | ORAL | 0 refills | Status: DC

## 2018-02-05 MED ORDER — GUAIFENESIN-CODEINE 100-10 MG/5ML PO SOLN: 10.0000 mL | Freq: Three times a day (TID) | ORAL | 0 refills | Status: DC | PRN

## 2018-02-05 MED ORDER — ALBUTEROL SULFATE (2.5 MG/3ML) 0.083% IN NEBU
2.5000 mg | INHALATION_SOLUTION | Freq: Once | RESPIRATORY_TRACT | Status: AC
  Administered 2018-02-05: 2.5 mg via RESPIRATORY_TRACT
  Filled 2018-02-05: qty 3

## 2018-02-05 MED ORDER — HYDROCOD POLST-CPM POLST ER 10-8 MG/5ML PO SUER
5.0000 mL | Freq: Once | ORAL | Status: AC
  Administered 2018-02-05: 5 mL via ORAL
  Filled 2018-02-05: qty 5

## 2018-02-05 MED ORDER — ALBUTEROL SULFATE HFA 108 (90 BASE) MCG/ACT IN AERS: 2.0000 | INHALATION_SPRAY | RESPIRATORY_TRACT | 1 refills | Status: DC | PRN

## 2018-02-05 NOTE — ED Triage Notes (Signed)
Pt states he has had a cough for the last 3 days but over the last 24 hours it will not stop and he is having trouble sleeping.  Patient states he has felt hot at home but does not have a thermometer to get a reading.  Pt denies any respiratory hx.

## 2018-02-05 NOTE — ED Provider Notes (Signed)
[facility] Emergency Department Provider Note  ____________________________________________  Time seen: Approximately 9:22 PM  I have reviewed the triage vital signs and the nursing notes.   HISTORY  Chief Complaint Cough and Fever   HPI Johncarlos Holtsclaw is a 62 y.o. male who presents to the emergency department for treatment and evaluation of cough for the past 3 days that has worsened over the past 24 hours.  Cough is preventing him from sleeping.  He has felt hot but has not checked his temperature.  He has a significant past medical history of congestive heart failure, diabetes, chronic renal failure. He has not missed any dialysis days.   Past Medical History:  Diagnosis Date  . Diabetes mellitus without complication ([place])   . Dialysis patient [facility])    tuesday, thursday and saturday  . Gout   . Hypertension   . Kidney disease     Patient Active Problem List   Diagnosis Date Noted  . Viral gastroenteritis 10/04/2016  . Anemia 10/04/2016  . Thrombocytopenia ([place]) 10/04/2016  . Rectal bleeding 10/04/2016  . Hyperkalemia 10/02/2016  . Pulmonary edema 05/13/2016    Past Surgical History:  Procedure Laterality Date  . AV FISTULA PLACEMENT    . miscellaneous     peritoneal dialysis catheter placement and removal    Prior to Admission medications   Medication Sig Start Date End Date Taking? Authorizing Provider  albuterol (PROVENTIL HFA;VENTOLIN HFA) 108 (90 Base) MCG/ACT inhaler Inhale 2 puffs into the lungs every 4 (four) hours as needed for wheezing or shortness of breath. 02/05/18   Rajan Burgard B, FNP  azithromycin (ZITHROMAX) 250 MG tablet 2 tablets today, then 1 tablet for the next 4 days. 02/05/18   Darnisha Vernet B, FNP  calcitRIOL (ROCALTROL) 0.25 MCG capsule Take 0.25 mcg by mouth 4 (four) times a week.     [provider]  calcium acetate (PHOSLO) 667 MG capsule Take 2,668 mg by mouth 3 (three) times daily with meals.      [provider]  citalopram (CELEXA) 20 MG tablet Take 20 mg by mouth daily.     [provider]  guaiFENesin-codeine 100-10 MG/5ML syrup Take 10 mLs by mouth 3 (three) times daily as needed. 02/05/18   Lachina Salsberry B, FNP  hydrALAZINE (APRESOLINE) 25 MG tablet Take 1 tablet (25 mg total) by mouth every 8 (eight) hours. Patient not taking: Reported on 10/02/2016 05/14/16   Fritzi Mandes, MD  insulin aspart protamine- aspart (NOVOLOG MIX 70/30) (70-30) 100 UNIT/ML injection Inject 10-12 Units into the skin 2 (two) times daily with a meal. 12 units in the morning and 10 units at night    [provider]  loperamide (IMODIUM) 2 MG capsule Take 1 capsule (2 mg total) by mouth every 6 (six) hours as needed for diarrhea or loose stools. Patient not taking: Reported on 09/22/2017 10/04/16   Theodoro Grist, MD    Allergies Patient has no known allergies.  Family History  Problem Relation Age of Onset  . Diabetes Mother   . Prostate cancer Father   . Kidney disease Sister     Social History Social History   Tobacco Use  . Smoking status: Never Smoker  . Smokeless tobacco: Never Used  Substance Use Topics  . Alcohol use: No  . Drug use: No    Review of Systems Constitutional: Negative for fever/chills ENT: Negative for negative for sore throat. Cardiovascular: Denies chest pain. Respiratory: Negative for shortness of breath.  Positive negative for for cough. Gastrointestinal: Negative for nausea, no no vomiting.  No diarrhea.  Musculoskeletal: Negative for body aches Skin: Negative for rash. Neurological: Negative for headaches ____________________________________________   PHYSICAL EXAM:  VITAL SIGNS: ED Triage Vitals  Enc Vitals Group     BP 02/05/18 2011 (!) 172/79     Pulse Rate 02/05/18 2011 87     Resp 02/05/18 2011 19     Temp 02/05/18 2011 (!) 100.4 F (38 C)     Temp Source 02/05/18 2011 Oral     SpO2 02/05/18 2011 95 %     Weight 02/05/18  2011 209 lb 7 oz (95 kg)     Height 02/05/18 2011 5\' 8"  (1.727 m)     Head Circumference --      Peak Flow --      Pain Score 02/05/18 2019 0     Pain Loc --      Pain Edu? --      Excl. in [place]? --     Constitutional: Alert and oriented.  Well appearing and in no acute distress. Eyes: Conjunctivae are normal. EOMI. Ears: Bilateral TMs normal Nose: No congestion noted; no rhinnorhea. Mouth/Throat: Mucous membranes are moist.  Oropharynx clear. Tonsils not visualized. Neck: No stridor.  Lymphatic: No cervical lymphadenopathy. Cardiovascular: Normal rate, regular rhythm. Good peripheral circulation. Respiratory: Normal respiratory effort.  No retractions.  Scattered rhonchi and expiratory wheeze present throughout with the exception of the left lower lobe. Gastrointestinal: Soft and nontender.  Musculoskeletal: FROM x 4 extremities.  Neurologic:  Normal speech and language.  Skin:  Skin is warm, dry and intact. No rash noted. Psychiatric: Mood and affect are normal. Speech and behavior are normal.  ____________________________________________   LABS (all labs ordered are listed, but only abnormal results are displayed)  Labs Reviewed - No data to display ____________________________________________  EKG  Not indicated ____________________________________________  RADIOLOGY  Chest x-ray shows a stable moderate bronchitic change.  Resolved changes of congestive heart failure and stable cardiomegaly are also noted per radiology. ____________________________________________   PROCEDURES  Procedure(s) performed: None  Critical Care performed: No ____________________________________________   INITIAL IMPRESSION / ASSESSMENT AND PLAN / ED COURSE  62 y.o. male who presents to the emergency department for treatment and evaluation of a persistent cough.  Chest x-ray does not show focal consolidation, however due to adventitious sounds, diabetes, renal failure and low-grade  fever, he will be treated with azithromycin, albuterol, and guaifenesin with codeine. He was advised to follow-up with his primary care provider next week.  He was encouraged to return to the ER for symptoms that change or worsen if unable to schedule an appointment.  Medications  albuterol (PROVENTIL) (2.5 MG/3ML) 0.083% nebulizer solution 2.5 mg (has no administration in time range)  chlorpheniramine-HYDROcodone (TUSSIONEX) 10-8 MG/5ML suspension 5 mL (has no administration in time range)    ED Discharge Orders        Ordered    azithromycin (ZITHROMAX) 250 MG tablet     02/05/18 2133    guaiFENesin-codeine 100-10 MG/5ML syrup  3 times daily PRN     02/05/18 2133    albuterol (PROVENTIL HFA;VENTOLIN HFA) 108 (90 Base) MCG/ACT inhaler  Every 4 hours PRN     02/05/18 2133       Pertinent labs & imaging results that were available during my care of the patient were reviewed by me and considered in my medical decision making (see chart for details).    If controlled  substance prescribed during this visit, 12 month history viewed on the [facility] prior to issuing an initial prescription for Schedule II or III opiod. ____________________________________________   FINAL CLINICAL IMPRESSION(S) / ED DIAGNOSES  Final diagnoses:  Bronchitis    Note:  This document was prepared using Dragon voice recognition software and may include unintentional dictation errors.     Victorino Dike, FNP 02/05/18 2143    Arta Silence, MD 02/05/18 2214

## 2018-02-26 ENCOUNTER — Encounter: Admit: 2018-02-26 | Discharge: 2018-02-27 | Payer: BLUE CROSS/BLUE SHIELD

## 2018-03-15 MED ORDER — LIRAGLUTIDE 0.6 MG/0.1 ML (18 MG/3 ML) SUBCUTANEOUS PEN INJECTOR
Freq: Every day | SUBCUTANEOUS | 0 refills | 0.00000 days | Status: CP
Start: 2018-03-15 — End: 2018-05-28

## 2018-03-28 ENCOUNTER — Ambulatory Visit: Admit: 2018-03-28 | Discharge: 2018-03-29

## 2018-03-29 MED ORDER — AMLODIPINE 5 MG TABLET
ORAL_TABLET | Freq: Every day | ORAL | 3 refills | 0.00000 days | Status: CP
Start: 2018-03-29 — End: 2018-03-29
  Filled 2018-06-02: qty 90, 90d supply, fill #0

## 2018-03-29 MED ORDER — AMLODIPINE 5 MG TABLET: 5 mg | tablet | Freq: Every day | 3 refills | 0 days | Status: AC

## 2018-04-28 ENCOUNTER — Ambulatory Visit: Admit: 2018-04-28 | Discharge: 2018-04-29

## 2018-05-28 MED ORDER — CALCIUM ACETATE(PHOSPHATE BINDERS) 667 MG CAPSULE: 2668 mg | capsule | Freq: Three times a day (TID) | 6 refills | 0 days | Status: AC

## 2018-05-28 MED ORDER — LISINOPRIL 20 MG TABLET: 20 mg | tablet | Freq: Every day | 3 refills | 0 days | Status: AC

## 2018-05-29 ENCOUNTER — Ambulatory Visit: Admit: 2018-05-29 | Discharge: 2018-05-30

## 2018-06-02 MED ORDER — LIRAGLUTIDE 0.6 MG/0.1 ML (18 MG/3 ML) SUBCUTANEOUS PEN INJECTOR
Freq: Every day | SUBCUTANEOUS | 0 refills | 0 days | Status: CP
Start: 2018-06-02 — End: 2019-06-02
  Filled 2018-06-03: qty 9, 90d supply, fill #0

## 2018-06-02 MED FILL — AMLODIPINE 5 MG TABLET: 90 days supply | Qty: 90 | Fill #0 | Status: AC

## 2018-06-02 MED FILL — SEVELAMER CARBONATE 800 MG TABLET: 30 days supply | Qty: 360 | Fill #0 | Status: AC

## 2018-06-02 MED FILL — LISINOPRIL 20 MG TABLET: 90 days supply | Qty: 90 | Fill #0 | Status: AC

## 2018-06-02 MED FILL — CITALOPRAM 20 MG TABLET: 90 days supply | Qty: 90 | Fill #0

## 2018-06-02 MED FILL — CALCIUM ACETATE(PHOSPHATE BINDERS) 667 MG CAPSULE: ORAL | 30 days supply | Qty: 540 | Fill #0

## 2018-06-02 MED FILL — LISINOPRIL 20 MG TABLET: ORAL | 90 days supply | Qty: 90 | Fill #0

## 2018-06-02 MED FILL — SEVELAMER CARBONATE 800 MG TABLET: 30 days supply | Qty: 360 | Fill #0

## 2018-06-02 MED FILL — CALCIUM ACETATE(PHOSPHATE BINDERS) 667 MG CAPSULE: 30 days supply | Qty: 540 | Fill #0 | Status: AC

## 2018-06-02 MED FILL — CITALOPRAM 20 MG TABLET: 90 days supply | Qty: 90 | Fill #0 | Status: AC

## 2018-06-03 MED FILL — VICTOZA 3-PAK 0.6 MG/0.1 ML (18 MG/3 ML) SUBCUTANEOUS PEN INJECTOR: 90 days supply | Qty: 9 | Fill #0 | Status: AC

## 2018-07-19 MED ORDER — ALBUTEROL SULFATE HFA 90 MCG/ACTUATION AEROSOL INHALER
Freq: Four times a day (QID) | RESPIRATORY_TRACT | 1 refills | 0 days | Status: CP | PRN
Start: 2018-07-19 — End: 2019-07-19

## 2018-07-27 ENCOUNTER — Encounter: Payer: Self-pay | Admitting: Emergency Medicine

## 2018-07-27 ENCOUNTER — Inpatient Hospital Stay
Admission: EM | Admit: 2018-07-27 | Discharge: 2018-07-29 | DRG: 640 | Disposition: A | Discharge status: Home / Self Care | Payer: Self-pay | Attending: Specialist | Admitting: Specialist

## 2018-07-27 ENCOUNTER — Emergency Department: Payer: Self-pay

## 2018-07-27 ENCOUNTER — Other Ambulatory Visit: Payer: Self-pay

## 2018-07-27 DIAGNOSIS — Z992 Dependence on renal dialysis: Secondary | ICD-10-CM

## 2018-07-27 DIAGNOSIS — E875 Hyperkalemia: Principal | ICD-10-CM | POA: Diagnosis present

## 2018-07-27 DIAGNOSIS — T829XXA Unspecified complication of cardiac and vascular prosthetic device, implant and graft, initial encounter: Secondary | ICD-10-CM

## 2018-07-27 DIAGNOSIS — Z9115 Patient's noncompliance with renal dialysis: Secondary | ICD-10-CM

## 2018-07-27 DIAGNOSIS — N186 End stage renal disease: Secondary | ICD-10-CM | POA: Diagnosis present

## 2018-07-27 DIAGNOSIS — I132 Hypertensive heart and chronic kidney disease with heart failure and with stage 5 chronic kidney disease, or end stage renal disease: Secondary | ICD-10-CM | POA: Diagnosis present

## 2018-07-27 DIAGNOSIS — R197 Diarrhea, unspecified: Secondary | ICD-10-CM | POA: Diagnosis present

## 2018-07-27 DIAGNOSIS — Z833 Family history of diabetes mellitus: Secondary | ICD-10-CM

## 2018-07-27 DIAGNOSIS — E1122 Type 2 diabetes mellitus with diabetic chronic kidney disease: Secondary | ICD-10-CM | POA: Diagnosis present

## 2018-07-27 DIAGNOSIS — Z794 Long term (current) use of insulin: Secondary | ICD-10-CM

## 2018-07-27 DIAGNOSIS — N185 Chronic kidney disease, stage 5: Secondary | ICD-10-CM

## 2018-07-27 DIAGNOSIS — Z79899 Other long term (current) drug therapy: Secondary | ICD-10-CM

## 2018-07-27 DIAGNOSIS — F329 Major depressive disorder, single episode, unspecified: Secondary | ICD-10-CM | POA: Diagnosis present

## 2018-07-27 DIAGNOSIS — Z841 Family history of disorders of kidney and ureter: Secondary | ICD-10-CM

## 2018-07-27 DIAGNOSIS — I5033 Acute on chronic diastolic (congestive) heart failure: Secondary | ICD-10-CM | POA: Diagnosis present

## 2018-07-27 DIAGNOSIS — N2581 Secondary hyperparathyroidism of renal origin: Secondary | ICD-10-CM | POA: Diagnosis present

## 2018-07-27 DIAGNOSIS — Z8042 Family history of malignant neoplasm of prostate: Secondary | ICD-10-CM

## 2018-07-27 DIAGNOSIS — D631 Anemia in chronic kidney disease: Secondary | ICD-10-CM | POA: Diagnosis present

## 2018-07-27 LAB — COMPREHENSIVE METABOLIC PANEL
ALK PHOS: 113 U/L (ref 38–126)
ALT: 26 U/L (ref 0–44)
AST: 24 U/L (ref 15–41)
Albumin: 3.9 g/dL (ref 3.5–5.0)
Anion gap: 19 — ABNORMAL HIGH (ref 5–15)
BUN: 109 mg/dL — AB (ref 8–23)
CALCIUM: 9 mg/dL (ref 8.9–10.3)
CHLORIDE: 93 mmol/L — AB (ref 98–111)
CO2: 23 mmol/L (ref 22–32)
CREATININE: 10.81 mg/dL — AB (ref 0.61–1.24)
GFR calc Af Amer: 5 mL/min — ABNORMAL LOW (ref >60)
GFR, EST NON AFRICAN AMERICAN: 4 mL/min — AB (ref >60)
Glucose, Bld: 174 mg/dL — ABNORMAL HIGH (ref 70–99)
Potassium: 6.2 mmol/L — ABNORMAL HIGH (ref 3.5–5.1)
Sodium: 135 mmol/L (ref 135–145)
Total Bilirubin: 0.9 mg/dL (ref 0.3–1.2)
Total Protein: 8.1 g/dL (ref 6.5–8.1)

## 2018-07-27 LAB — PSA: Prostatic Specific Antigen: 0.43 ng/mL (ref 0.00–4.00)

## 2018-07-27 LAB — CBC
HEMATOCRIT: 35.8 % — AB (ref 39.0–52.0)
Hemoglobin: 11.4 g/dL — ABNORMAL LOW (ref 13.0–17.0)
MCH: 31.8 pg (ref 26.0–34.0)
MCHC: 31.8 g/dL (ref 30.0–36.0)
MCV: 99.7 fL (ref 80.0–100.0)
Platelets: 159 10*3/uL (ref 150–400)
RBC: 3.59 MIL/uL — ABNORMAL LOW (ref 4.22–5.81)
RDW: 14.9 % (ref 11.5–15.5)
WBC: 7.9 10*3/uL (ref 4.0–10.5)
nRBC: 0 % (ref 0.0–0.2)

## 2018-07-27 LAB — TROPONIN I: Troponin I: 0.06 ng/mL (ref <0.03)

## 2018-07-27 LAB — LIPASE, BLOOD: LIPASE: 47 U/L (ref 11–51)

## 2018-07-27 MED ORDER — ONDANSETRON HCL 4 MG PO TABS: 4.0000 mg | ORAL_TABLET | Freq: Four times a day (QID) | ORAL | Status: DC | PRN

## 2018-07-27 MED ORDER — SODIUM CHLORIDE 0.9 % IV BOLUS
200.0000 mL | Freq: Once | INTRAVENOUS | Status: AC
  Administered 2018-07-27: 200 mL via INTRAVENOUS

## 2018-07-27 MED ORDER — ONDANSETRON HCL 4 MG/2ML IJ SOLN: 4.0000 mg | Freq: Four times a day (QID) | INTRAMUSCULAR | Status: DC | PRN

## 2018-07-27 MED ORDER — SEVELAMER CARBONATE 800 MG PO TABS
3200.0000 mg | ORAL_TABLET | Freq: Three times a day (TID) | ORAL | Status: DC
  Administered 2018-07-28 – 2018-07-29 (×3): 3200 mg via ORAL
  Filled 2018-07-27 (×3): qty 4

## 2018-07-27 MED ORDER — ACETAMINOPHEN 325 MG PO TABS: 650.0000 mg | ORAL_TABLET | Freq: Four times a day (QID) | ORAL | Status: DC | PRN

## 2018-07-27 MED ORDER — CALCIUM ACETATE (PHOS BINDER) 667 MG PO CAPS
1334.0000 mg | ORAL_CAPSULE | Freq: Two times a day (BID) | ORAL | Status: DC
  Administered 2018-07-28: 1334 mg via ORAL
  Filled 2018-07-27: qty 2

## 2018-07-27 MED ORDER — CALCIUM ACETATE (PHOS BINDER) 667 MG PO CAPS
2668.0000 mg | ORAL_CAPSULE | Freq: Three times a day (TID) | ORAL | Status: DC
  Administered 2018-07-28 – 2018-07-29 (×3): 2668 mg via ORAL
  Filled 2018-07-27 (×3): qty 4

## 2018-07-27 MED ORDER — LISINOPRIL 20 MG PO TABS
20.0000 mg | ORAL_TABLET | Freq: Every day | ORAL | Status: DC
  Administered 2018-07-27 – 2018-07-29 (×3): 20 mg via ORAL
  Filled 2018-07-27 (×3): qty 1

## 2018-07-27 MED ORDER — OMEPRAZOLE MAGNESIUM 20 MG PO TBEC: 20.0000 mg | DELAYED_RELEASE_TABLET | Freq: Every day | ORAL | Status: DC

## 2018-07-27 MED ORDER — ACETAMINOPHEN 650 MG RE SUPP: 650.0000 mg | Freq: Four times a day (QID) | RECTAL | Status: DC | PRN

## 2018-07-27 MED ORDER — CITALOPRAM HYDROBROMIDE 20 MG PO TABS
20.0000 mg | ORAL_TABLET | Freq: Every day | ORAL | Status: DC
  Administered 2018-07-27 – 2018-07-29 (×3): 20 mg via ORAL
  Filled 2018-07-27 (×3): qty 1

## 2018-07-27 MED ORDER — AMLODIPINE BESYLATE 5 MG PO TABS
5.0000 mg | ORAL_TABLET | Freq: Every day | ORAL | Status: DC
  Administered 2018-07-27 – 2018-07-29 (×3): 5 mg via ORAL
  Filled 2018-07-27 (×3): qty 1

## 2018-07-27 MED ORDER — INSULIN ASPART PROT & ASPART (70-30 MIX) 100 UNIT/ML ~~LOC~~ SUSP
12.0000 [IU] | Freq: Every morning | SUBCUTANEOUS | Status: DC
  Administered 2018-07-28 – 2018-07-29 (×2): 12 [IU] via SUBCUTANEOUS
  Filled 2018-07-27 (×2): qty 10

## 2018-07-27 MED ORDER — INSULIN ASPART PROT & ASPART (70-30 MIX) 100 UNIT/ML ~~LOC~~ SUSP: 10.0000 [IU] | Freq: Every day | SUBCUTANEOUS | Status: DC

## 2018-07-27 MED ORDER — IOHEXOL 300 MG/ML  SOLN
30.0000 mL | Freq: Once | INTRAMUSCULAR | Status: AC | PRN
  Administered 2018-07-27: 15 mL via ORAL

## 2018-07-27 MED ORDER — PANTOPRAZOLE SODIUM 40 MG PO TBEC
40.0000 mg | DELAYED_RELEASE_TABLET | Freq: Every day | ORAL | Status: DC
  Administered 2018-07-28 – 2018-07-29 (×2): 40 mg via ORAL
  Filled 2018-07-27 (×2): qty 1

## 2018-07-27 MED ORDER — ALBUTEROL SULFATE (2.5 MG/3ML) 0.083% IN NEBU
2.5000 mg | INHALATION_SOLUTION | Freq: Once | RESPIRATORY_TRACT | Status: AC
  Administered 2018-07-27: 2.5 mg via RESPIRATORY_TRACT
  Filled 2018-07-27: qty 3

## 2018-07-27 MED ORDER — HEPARIN SODIUM (PORCINE) 5000 UNIT/ML IJ SOLN
5000.0000 [IU] | Freq: Three times a day (TID) | INTRAMUSCULAR | Status: DC
  Administered 2018-07-27 – 2018-07-29 (×5): 5000 [IU] via SUBCUTANEOUS
  Filled 2018-07-27 (×5): qty 1

## 2018-07-27 NOTE — Progress Notes (Signed)
[facility]  ROUNDING NOTE   Subjective:   [name] admitted to [facility] on 07/27/2018 for Renal Failure   Patient presents with his wife who assists with history taking. Patient has been having diarrha for last 4 days. Missed yesterdays dialysis. Last hemodialysis was Friday.   Now with potassium of 6.2  Objective:  Vital signs in last 24 hours:  Temp:  [97.7 F (36.5 C)] 97.7 F (36.5 C) (10/29 1131) Pulse Rate:  [56-65] 56 (10/29 1530) Resp:  [18] 18 (10/29 1407) BP: (160-185)/(72-85) 168/85 (10/29 1530) SpO2:  [91 %-100 %] 97 % (10/29 1530) Weight:  [94 kg] 94 kg (10/29 1132)  Weight change:  Filed Weights   07/27/18 1132  Weight: 94 kg    Intake/Output: No intake/output data recorded.   Intake/Output this shift:  Total I/O In: 181 [IV Piggyback:181] Out: -   Physical Exam: General: NAD,   Head: Normocephalic, atraumatic. Moist oral mucosal membranes  Eyes: Anicteric, PERRL  Neck: Supple, trachea midline  Lungs:  Clear to auscultation  Heart: Regular rate and rhythm  Abdomen:  Soft, nontender,   Extremities:  peripheral edema.  Neurologic: Nonfocal, moving all four extremities  Skin: No lesions  Access: Left AVF    Basic Metabolic Panel: Recent Labs  Lab 07/27/18 1135  NA 135  K 6.2*  CL 93*  CO2 23  GLUCOSE 174*  BUN 109*  CREATININE 10.81*  CALCIUM 9.0    Liver Function Tests: Recent Labs  Lab 07/27/18 1135  AST 24  ALT 26  ALKPHOS 113  BILITOT 0.9  PROT 8.1  ALBUMIN 3.9   Recent Labs  Lab 07/27/18 1135  LIPASE 47   No results for input(s): AMMONIA in the last 168 hours.  CBC: Recent Labs  Lab 07/27/18 1135  WBC 7.9  HGB 11.4*  HCT 35.8*  MCV 99.7  PLT 159    Cardiac Enzymes: Recent Labs  Lab 07/27/18 1135  TROPONINI 0.06*    BNP: Invalid input(s): POCBNP  CBG: No results for input(s): GLUCAP in the last 168 hours.  Microbiology: Results for orders placed or performed during the  hospital encounter of 09/22/17  MRSA PCR Screening     Status: None   Collection Time: 09/22/17  6:49 PM  Result Value Ref Range Status   MRSA by PCR NEGATIVE NEGATIVE Final    Comment:        The GeneXpert MRSA Assay (FDA approved for NASAL specimens only), is one component of a comprehensive MRSA colonization surveillance program. It is not intended to diagnose MRSA infection nor to guide or monitor treatment for MRSA infections. Performed at [facility], [address] [place], [place] [address]     Coagulation Studies: No results for input(s): LABPROT, INR in the last 72 hours.  Urinalysis: No results for input(s): COLORURINE, LABSPEC, PHURINE, GLUCOSEU, HGBUR, BILIRUBINUR, KETONESUR, PROTEINUR, UROBILINOGEN, NITRITE, LEUKOCYTESUR in the last 72 hours.  Invalid input(s): APPERANCEUR    Imaging: Dg Chest 2 View  Result Date: 07/27/2018 CLINICAL DATA:  2-3 week history of shortness of breath. Four days of diarrhea. History of diabetes, dialysis dependent renal failure. Nonsmoker. EXAM: CHEST - 2 VIEW COMPARISON:  PA and lateral chest x-ray of Feb 05, 2018. FINDINGS: The lungs are adequately inflated. The interstitial markings are coarse. The pulmonary vascularity is mildly engorged. The cardiac silhouette is enlarged and larger than that on the previous study. There is calcification in the wall of the aortic arch. The mediastinum  is normal in width. The bony thorax exhibits no acute abnormality. IMPRESSION: Low-grade compensated CHF slightly more conspicuous than on the previous study. No alveolar pneumonia. Electronically Signed   By: David  Martinique M.D.   On: 07/27/2018 12:04     Medications:       Assessment/ Plan:  [name] is a 62 y.o. hispanic male with end stage renal disease on hemodialysis, insulin dependent diabetes mellitus type 2, gout, hypertension, anemia of chronic kidney disease, secondary hyperparathyroidism,depression  MWF [facility]  Nephrology [place] Left AVF  1.  End-stage renal disease with hyperkalemia:  Emergent hemodialysis tonight - then resume MWF schedule  2.  Anemia of chronic kidney disease: hemoglobin 11.4 - [facility] as outpatient.   3. Secondary Hyperparathyroidism:  - calcium acetate with meals.   4. Hypertension: elevated. Did not take home meds for several days due to diarrhea.  Home regimen of amlodipine and lisinopril.    LOS: 0 Apollo Timothy 10/29/20194:33 PM

## 2018-07-27 NOTE — Progress Notes (Signed)
HD Treatment Complete    07/27/18 2033  Vital Signs  Pulse Rate 73  Pulse Rate Source Monitor  Resp 16  BP (!) 191/93  BP Location Right Arm  BP Method Automatic  Patient Position (if appropriate) Lying  Oxygen Therapy  SpO2 100 %  O2 Device Room Air  During Hemodialysis Assessment  Blood Flow Rate (mL/min) 400 mL/min  Arterial Pressure (mmHg) -70 mmHg  Venous Pressure (mmHg) 250 mmHg  Transmembrane Pressure (mmHg) 50 mmHg  Ultrafiltration Rate (mL/min) 400 mL/min  Dialysate Flow Rate (mL/min) 600 ml/min  Conductivity: Machine  14.2  HD Safety Checks Performed Yes  Intra-Hemodialysis Comments Progressing as prescribed;Tx completed (UF 1003)

## 2018-07-27 NOTE — Progress Notes (Signed)
Pre HD Assessment    07/27/18 1800  Neurological  Level of Consciousness Alert  Orientation Level Oriented X4  Respiratory  Respiratory Pattern Regular;Unlabored;Symmetrical  Chest Assessment Chest expansion symmetrical  Bilateral Breath Sounds Clear  Cardiac  Pulse Regular  Heart Sounds S1, S2  Jugular Venous Distention (JVD) No  ECG Monitor Yes  Cardiac Rhythm NSR  Vascular  R Radial Pulse +2  L Radial Pulse +2  R Dorsalis Pedis Pulse +2  L Dorsalis Pedis Pulse +2  Integumentary  Integumentary (WDL) WDL  Musculoskeletal  Musculoskeletal (WDL) X  Generalized Weakness Yes  GU Assessment  Genitourinary (WDL) X (HD pt)  Psychosocial  Psychosocial (WDL) WDL

## 2018-07-27 NOTE — ED Triage Notes (Signed)
Pt arrives with complaints of diarrhea since Friday. Pt reports 20/25 episodes in the last day, pt states "it isn't like water just really loose". Pt also reports lower left and right flank pain. PT is a dialysis pt and goes M/W/F. Pt missed Monday dialysis and was going to reschedule for today but pt was unable to because of the recurrent loose stool episodes.

## 2018-07-27 NOTE — ED Provider Notes (Addendum)
[facility] Emergency Department Provider Note   ____________________________________________   First MD Initiated Contact with Patient 07/27/18 1424     (approximate)  I have reviewed the triage vital signs and the nursing notes.   HISTORY  Chief Complaint Diarrhea    HPI Danny [name] is a 62 y.o. male patient reports he is stooling about every 30 minutes.  This started Friday.  The stools are not liquid but they are soft.  He reports the only new thing that he is done is on Wednesday he started omeprazole.  Omeprazole was for reflux symptoms.  He is not running a fever he does not have any abdominal crampy pain or any other complaints.  He missed dialysis the last 2 times because of his stooling problem.  His wife says he seems to be giving up and does not want dialysis anymore.  She is also worried about his prostate because of numerous people in his family has prostate illness and he does not want infected now also refuses rectal exam.  Past Medical History:  Diagnosis Date  . Diabetes mellitus without complication ([place])   . Dialysis patient [facility])    tuesday, thursday and saturday  . Gout   . Hypertension   . Kidney disease     Patient Active Problem List   Diagnosis Date Noted  . Viral gastroenteritis 10/04/2016  . Anemia 10/04/2016  . Thrombocytopenia ([place]) 10/04/2016  . Rectal bleeding 10/04/2016  . Hyperkalemia 10/02/2016  . Pulmonary edema 05/13/2016    Past Surgical History:  Procedure Laterality Date  . AV FISTULA PLACEMENT    . miscellaneous     peritoneal dialysis catheter placement and removal    Prior to Admission medications   Medication Sig Start Date End Date Taking? Authorizing Provider  albuterol (PROVENTIL HFA;VENTOLIN HFA) 108 (90 Base) MCG/ACT inhaler Inhale 2 puffs into the lungs every 4 (four) hours as needed for wheezing or shortness of breath. 02/05/18   Triplett, Cari B, FNP  azithromycin (ZITHROMAX) 250 MG  tablet 2 tablets today, then 1 tablet for the next 4 days. 02/05/18   Triplett, Cari B, FNP  calcitRIOL (ROCALTROL) 0.25 MCG capsule Take 0.25 mcg by mouth 4 (four) times a week.     [provider]  calcium acetate (PHOSLO) 667 MG capsule Take 2,668 mg by mouth 3 (three) times daily with meals.     [provider]  citalopram (CELEXA) 20 MG tablet Take 20 mg by mouth daily.     [provider]  guaiFENesin-codeine 100-10 MG/5ML syrup Take 10 mLs by mouth 3 (three) times daily as needed. 02/05/18   Triplett, Cari B, FNP  hydrALAZINE (APRESOLINE) 25 MG tablet Take 1 tablet (25 mg total) by mouth every 8 (eight) hours. Patient not taking: Reported on 10/02/2016 05/14/16   Fritzi Mandes, MD  insulin aspart protamine- aspart (NOVOLOG MIX 70/30) (70-30) 100 UNIT/ML injection Inject 10-12 Units into the skin 2 (two) times daily with a meal. 12 units in the morning and 10 units at night    [provider]  loperamide (IMODIUM) 2 MG capsule Take 1 capsule (2 mg total) by mouth every 6 (six) hours as needed for diarrhea or loose stools. Patient not taking: Reported on 09/22/2017 10/04/16   Theodoro Grist, MD    Allergies Patient has no known allergies.  Family History  Problem Relation Age of Onset  . Diabetes Mother   . Prostate cancer Father   . Kidney disease  Sister     Social History Social History   Tobacco Use  . Smoking status: Never Smoker  . Smokeless tobacco: Never Used  Substance Use Topics  . Alcohol use: No  . Drug use: No    Review of Systems  Constitutional: No fever/chills Eyes: No visual changes. ENT: No sore throat. Cardiovascular: Denies chest pain. Respiratory: Denies shortness of breath. Gastrointestinal see HPI Genitourinary: Negative for dysuria. Musculoskeletal: Negative for back pain. Skin: Negative for rash. Neurological: Negative for headaches, focal weakness   ____________________________________________   PHYSICAL  EXAM:  VITAL SIGNS: ED Triage Vitals  Enc Vitals Group     BP 07/27/18 1131 (!) 185/85     Pulse Rate 07/27/18 1131 65     Resp 07/27/18 1131 18     Temp 07/27/18 1131 97.7 F (36.5 C)     Temp Source 07/27/18 1131 Oral     SpO2 07/27/18 1131 91 %     Weight 07/27/18 1132 207 lb 3.7 oz (94 kg)     Height 07/27/18 1132 5\' 8"  (1.727 m)     Head Circumference --      Peak Flow --      Pain Score 07/27/18 1132 8     Pain Loc --      Pain Edu? --      Excl. in [place]? --     Constitutional: Alert and oriented. Well appearing and in no acute distress. Eyes: Conjunctivae are normal.  Head: Atraumatic. Nose: No congestion/rhinnorhea. Mouth/Throat: Mucous membranes are moist.  Oropharynx non-erythematous. Neck: No stridor.   Cardiovascular: Normal rate, regular rhythm. Grossly normal heart sounds.  Good peripheral circulation. Respiratory: Normal respiratory effort.  No retractions. Lungs CTAB. Gastrointestinal: Soft and nontender. No distention. No abdominal bruits. No CVA tenderness. Musculoskeletal: No lower extremity tenderness nor edema.  Neurologic:  Normal speech and language. No gross focal neurologic deficits are appreciated. No gait instability. Skin:  Skin is warm, dry and intact. No rash noted. Psychiatric: Mood and affect are normal. Speech and behavior are normal.  ____________________________________________   LABS (all labs ordered are listed, but only abnormal results are displayed)  Labs Reviewed  COMPREHENSIVE METABOLIC PANEL - Abnormal; Notable for the following components:      Result Value   Potassium 6.2 (*)    Chloride 93 (*)    Glucose, Bld 174 (*)    BUN 109 (*)    Creatinine, Ser 10.81 (*)    GFR calc non Af Amer 4 (*)    GFR calc Af Amer 5 (*)    Anion gap 19 (*)    All other components within normal limits  CBC - Abnormal; Notable for the following components:   RBC 3.59 (*)    Hemoglobin 11.4 (*)    HCT 35.8 (*)    All other components within  normal limits  TROPONIN I - Abnormal; Notable for the following components:   Troponin I 0.06 (*)    All other components within normal limits  GASTROINTESTINAL PANEL BY PCR, STOOL (REPLACES STOOL CULTURE)  C DIFFICILE QUICK SCREEN W PCR REFLEX  LIPASE, BLOOD  URINALYSIS, COMPLETE (UACMP) WITH MICROSCOPIC  PSA   ____________________________________________  EKG EKG read interpreted by me shows normal sinus rhythm rate of 63 left axis there is lip T's in 1 and L these are old and have been present for many months.  ____________________________________________  RADIOLOGY  ED MD interpretation: Chest x-ray read by radiology reviewed by me shows what appears  to be slight amount of CHF.  CT does not show a lot of stool in the colon although there is a possible stool ball in the cecum.  There is no colonic wall thickening.  Patient has not had a stool yet at this point in its 4:50 PM. Official radiology report(s): Dg Chest 2 View  Result Date: 07/27/2018 CLINICAL DATA:  2-3 week history of shortness of breath. Four days of diarrhea. History of diabetes, dialysis dependent renal failure. Nonsmoker. EXAM: CHEST - 2 VIEW COMPARISON:  PA and lateral chest x-ray of Feb 05, 2018. FINDINGS: The lungs are adequately inflated. The interstitial markings are coarse. The pulmonary vascularity is mildly engorged. The cardiac silhouette is enlarged and larger than that on the previous study. There is calcification in the wall of the aortic arch. The mediastinum is normal in width. The bony thorax exhibits no acute abnormality. IMPRESSION: Low-grade compensated CHF slightly more conspicuous than on the previous study. No alveolar pneumonia. Electronically Signed   By: David  Martinique M.D.   On: 07/27/2018 12:04    ____________________________________________   PROCEDURES  Procedure(s) performed:   Procedures  Critical Care performed:   ____________________________________________   INITIAL  IMPRESSION / ASSESSMENT AND PLAN / ED COURSE  Patient is missed last 2 episodes dialysis.  His potassium is 6.2 his renal function slightly worse.  EKG really does not look bad at all.  His QRS is slightly wider than it was in December  ----------------------------------------- 4:50 PM on 07/27/2018 -----------------------------------------  Discussed patient with Dr. Juleen China he will do dialysis on the patient tonight Dr. Liberty Handy is admitting the patient he will continue to monitor the patient's stool status and further investigate the patient's stooling problem.  He has reviewed the radiologist report of the CT with me.  Clinical Course as of Jul 27 1648  Tue Jul 27, 2018  1649 Lipase: 33 [PM]    Clinical Course User Index [PM] Nena Polio, MD     ____________________________________________   FINAL CLINICAL IMPRESSION(S) / ED DIAGNOSES  Final diagnoses:  Hyperkalemia  Dialysis complication, initial encounter   She will diagnosis is hyperkalemia, needs dialysis  ED Discharge Orders    None       Note:  This document was prepared using Dragon voice recognition software and may include unintentional dictation errors.    Nena Polio, MD 07/27/18 1600    Nena Polio, MD 07/27/18 1650    Nena Polio, MD 07/27/18 [phone]

## 2018-07-27 NOTE — ED Notes (Signed)
Call wife Addison Bailey [phone] when pt has completed treatment and gets assigned room number

## 2018-07-27 NOTE — ED Notes (Addendum)
Report given to [facility] in dialysis. Pt ok to come once orders are put in for pt. Pt to come to Bed 1

## 2018-07-27 NOTE — Progress Notes (Signed)
Post HD Treatment  Pt tolerated treatment well. His Net UF was 503 and his BVP was 59. All blood was returned to patient. No complaints noted. Report called to primary RN.     07/27/18 2039  Hand-Off documentation  Report given to (Full Name) Cyndra Numbers, RN  Report received from (Full Name) Stephannie Peters, RN  Vital Signs  Pulse Rate 70  Pulse Rate Source Monitor  Resp 14  BP (!) 188/87  BP Location Right Arm  BP Method Automatic  Patient Position (if appropriate) Lying  Oxygen Therapy  SpO2 100 %  O2 Device Room Air  Pain Assessment  Pain Scale 0-10  Pain Score 0  Dialysis Weight  Weight  (Unable to obtain)  Type of Weight Post-Dialysis  Post-Hemodialysis Assessment  Rinseback Volume (mL) 250 mL  KECN 259 V  Dialyzer Clearance Lightly streaked  Duration of HD Treatment -hour(s) 2.5 hour(s)  Hemodialysis Intake (mL) 500 mL  UF Total -Machine (mL) 1003 mL  Net UF (mL) 503 mL  Tolerated HD Treatment Yes  Post-Hemodialysis Comments Pt tolerated treatment well  AVG/AVF Arterial Site Held (minutes) 15 minutes  AVG/AVF Venous Site Held (minutes) 10 minutes  Fistula / Graft Left Upper arm Arteriovenous fistula  No Placement Date or Time found.   Orientation: Left  Access Location: Upper arm  Access Type: Arteriovenous fistula  Site Condition No complications  Fistula / Graft Assessment Present;Thrill;Bruit  Status Deaccessed  Drainage Description None

## 2018-07-27 NOTE — Progress Notes (Signed)
HD Treatment Initiated    07/27/18 1802  Vital Signs  Pulse Rate (!) 59  Pulse Rate Source Dinamap  Resp 17  BP (!) 169/83  BP Location Right Arm  BP Method Automatic  Patient Position (if appropriate) Lying  Oxygen Therapy  SpO2 98 %  O2 Device Room Air  During Hemodialysis Assessment  Blood Flow Rate (mL/min) 400 mL/min  Arterial Pressure (mmHg) -70 mmHg  Venous Pressure (mmHg) 230 mmHg  Transmembrane Pressure (mmHg) 50 mmHg  Ultrafiltration Rate (mL/min) 400 mL/min  Dialysate Flow Rate (mL/min) 600 ml/min  Conductivity: Machine  14.1  HD Safety Checks Performed Yes  Dialysis Fluid Bolus Normal Saline  Bolus Amount (mL) 250 mL  Intra-Hemodialysis Comments Tx initiated;Progressing as prescribed  Fistula / Graft Left Upper arm Arteriovenous fistula  No Placement Date or Time found.   Orientation: Left  Access Location: Upper arm  Access Type: Arteriovenous fistula  Status Accessed  Needle Size 15

## 2018-07-27 NOTE — ED Notes (Signed)
MD admitting at bedside. 

## 2018-07-27 NOTE — Progress Notes (Signed)
Post HD Assessment    07/27/18 2040  Neurological  Level of Consciousness Alert  Orientation Level Oriented X4  Respiratory  Respiratory Pattern Regular;Unlabored;Symmetrical  Chest Assessment Chest expansion symmetrical  Bilateral Breath Sounds Clear  Cardiac  Pulse Regular  Heart Sounds S1, S2  Jugular Venous Distention (JVD) No  ECG Monitor Yes  Cardiac Rhythm NSR  Vascular  R Radial Pulse +2  L Radial Pulse +2  R Dorsalis Pedis Pulse +2  L Dorsalis Pedis Pulse +2  Integumentary  Integumentary (WDL) WDL  Musculoskeletal  Musculoskeletal (WDL) X  Generalized Weakness Yes  GU Assessment  Genitourinary (WDL) X (HD pt)  Psychosocial  Psychosocial (WDL) WDL

## 2018-07-27 NOTE — Progress Notes (Signed)
Pre HD Treatment    07/27/18 1758  Vital Signs  Temp 97.8 F (36.6 C)  Temp Source Oral  Pulse Rate (!) 59  Pulse Rate Source Dinamap  Resp 14  BP (!) 169/81  BP Location Right Arm  BP Method Automatic  Patient Position (if appropriate) Lying  Oxygen Therapy  SpO2 100 %  O2 Device Room Air  Pain Assessment  Pain Scale 0-10  Pain Score 0  Dialysis Weight  Weight  (unable to stand for weight)  Type of Weight Pre-Dialysis  Time-Out for Hemodialysis  What Procedure? HD  Pt Identifiers(min of two) First/Last Name;MRN/Account#;Pt's DOB(use if MRN/Acct# not available  Correct Site? Yes  Correct Side? Yes  Correct Procedure? Yes  Consents Verified? Yes  Rad Studies Available? N/A  Safety Precautions Reviewed? Yes  Engineer, civil (consulting) Number 2  Station Number 1  UF/Alarm Test Passed  Conductivity: Meter 14  Conductivity: Machine  14.2  pH 7.2  Reverse Osmosis Main  Normal Saline Lot Number [id number]  Dialyzer Lot Number [id number]  Disposable Set Lot Number [id number]  Machine Temperature 98.6 F (37 C)  Musician and Audible Yes  Blood Lines Intact and Secured Yes  Pre Treatment Patient Checks  Vascular access used during treatment Fistula  Hepatitis B Surface Antigen Results Negative  Date Hepatitis B Surface Antigen Drawn 06/02/18  Hepatitis B Surface Antibody  (17 )  Date Hepatitis B Surface Antibody Drawn 04/22/18  Hemodialysis Consent Verified Yes  Hemodialysis Standing Orders Initiated Yes  ECG (Telemetry) Monitor On Yes  Prime Ordered Normal Saline  Length of  DialysisTreatment -hour(s) 2.5 Hour(s)  Dialysis Treatment Comments  (Pt consent obtained)  Dialyzer Elisio 17H NR  Dialysate 2K, 2.5 Ca  Variable Sodium Other (Comment)  Dialysis Anticoagulant None  Dialysate Flow Ordered 600  Blood Flow Rate Ordered 400 mL/min  Ultrafiltration Goal 0.5 Liters  Pre Treatment Labs Hepatitis B Surface Antigen  Dialysis Blood Pressure Support Ordered Normal  Saline  Education / Care Plan  Dialysis Education Provided Yes  Documented Education in Care Plan Yes  Fistula / Graft Left Upper arm Arteriovenous fistula  No Placement Date or Time found.   Orientation: Left  Access Location: Upper arm  Access Type: Arteriovenous fistula  Site Condition No complications  Fistula / Graft Assessment Present;Thrill;Bruit  Drainage Description None

## 2018-07-27 NOTE — ED Notes (Signed)
Pt informed of needing urine and stool sample when able

## 2018-07-27 NOTE — H&P (Signed)
[facility] [place] NAME: Melbert Botelho    MR#:  [id number]  DATE OF BIRTH:  1956/02/28  DATE OF ADMISSION:  07/27/2018  PRIMARY CARE PHYSICIAN: Patient, No Pcp Per   REQUESTING/REFERRING PHYSICIAN: Dr. Conni Slipper  CHIEF COMPLAINT:   Chief Complaint  Patient presents with  . Diarrhea    HISTORY OF PRESENT ILLNESS:  Richad Ramsay  is a 62 y.o. male with a known history of end-stage renal disease on hemodialysis, diabetes, hypertension, gout who presents to the hospital due to diarrhea.  Patient says he developed acute diarrhea over the past few days and has had multiple stools every day.  His stools are not bloody and are soft and not watery in nature.  They are not associated with food intake, he denies any worsening abdominal pain but does admit to some intermittent nausea.  Patient tried to take some Pepto-Bismol for his GI illness but has not improved.  He is supposed to get dialysis on Tuesday Thursday Saturday and missed his dialysis today and also Saturday.  He therefore came to the ER for further evaluation and was noted to be mildly hyperkalemic and chest x-ray findings just of mild volume overload with CHF and therefore hospitalist services were contacted for admission.  PAST MEDICAL HISTORY:   Past Medical History:  Diagnosis Date  . Diabetes mellitus without complication ([place])   . Dialysis patient [facility])    tuesday, thursday and saturday  . Gout   . Hypertension   . Kidney disease     PAST SURGICAL HISTORY:   Past Surgical History:  Procedure Laterality Date  . AV FISTULA PLACEMENT    . miscellaneous     peritoneal dialysis catheter placement and removal    SOCIAL HISTORY:   Social History   Tobacco Use  . Smoking status: Never Smoker  . Smokeless tobacco: Never Used  Substance Use Topics  . Alcohol use: No    FAMILY HISTORY:   Family History  Problem Relation Age of Onset  . Diabetes Mother   .  Prostate cancer Father   . Kidney disease Sister     DRUG ALLERGIES:  No Known Allergies  REVIEW OF SYSTEMS:   Review of Systems  Constitutional: Negative for fever and weight loss.  HENT: Negative for congestion, nosebleeds and tinnitus.   Eyes: Negative for blurred vision, double vision and redness.  Respiratory: Positive for shortness of breath. Negative for cough and hemoptysis.   Cardiovascular: Negative for chest pain, orthopnea, leg swelling and PND.  Gastrointestinal: Positive for diarrhea. Negative for abdominal pain, melena, nausea and vomiting.  Genitourinary: Negative for dysuria, hematuria and urgency.  Musculoskeletal: Negative for falls and joint pain.  Neurological: Negative for dizziness, tingling, sensory change, focal weakness, seizures, weakness and headaches.  Endo/Heme/Allergies: Negative for polydipsia. Does not bruise/bleed easily.  Psychiatric/Behavioral: Negative for depression and memory loss. The patient is not nervous/anxious.     MEDICATIONS AT HOME:   Prior to Admission medications   Medication Sig Start Date End Date Taking? Authorizing Provider  albuterol (PROVENTIL HFA;VENTOLIN HFA) 108 (90 Base) MCG/ACT inhaler Inhale 2 puffs into the lungs every 4 (four) hours as needed for wheezing or shortness of breath. 02/05/18   Triplett, Cari B, FNP  azithromycin (ZITHROMAX) 250 MG tablet 2 tablets today, then 1 tablet for the next 4 days. 02/05/18   Triplett, Cari B, FNP  calcitRIOL (ROCALTROL) 0.25 MCG capsule Take 0.25 mcg by mouth  4 (four) times a week.     [provider]  calcium acetate (PHOSLO) 667 MG capsule Take 2,668 mg by mouth 3 (three) times daily with meals.     [provider]  citalopram (CELEXA) 20 MG tablet Take 20 mg by mouth daily.     [provider]  guaiFENesin-codeine 100-10 MG/5ML syrup Take 10 mLs by mouth 3 (three) times daily as needed. 02/05/18   Triplett, Cari B, FNP  hydrALAZINE (APRESOLINE) 25 MG tablet  Take 1 tablet (25 mg total) by mouth every 8 (eight) hours. Patient not taking: Reported on 10/02/2016 05/14/16   Fritzi Mandes, MD  insulin aspart protamine- aspart (NOVOLOG MIX 70/30) (70-30) 100 UNIT/ML injection Inject 10-12 Units into the skin 2 (two) times daily with a meal. 12 units in the morning and 10 units at night    [provider]  loperamide (IMODIUM) 2 MG capsule Take 1 capsule (2 mg total) by mouth every 6 (six) hours as needed for diarrhea or loose stools. Patient not taking: Reported on 09/22/2017 10/04/16   Theodoro Grist, MD      VITAL SIGNS:  Blood pressure (!) 167/79, pulse 60, temperature 97.7 F (36.5 C), temperature source Oral, resp. rate 18, height 5\' 8"  (1.727 m), weight 94 kg, SpO2 98 %.  PHYSICAL EXAMINATION:  Physical Exam  GENERAL:  62 y.o.-year-old obese patient lying in the bed with no acute distress.  EYES: Pupils equal, round, reactive to light and accommodation. No scleral icterus. Extraocular muscles intact.  HEENT: Head atraumatic, normocephalic. Oropharynx and nasopharynx clear. No oropharyngeal erythema, moist oral mucosa  NECK:  Supple, no jugular venous distention. No thyroid enlargement, no tenderness.  LUNGS: Normal breath sounds bilaterally, no wheezing, rales, rhonchi. No use of accessory muscles of respiration.  CARDIOVASCULAR: S1, S2 RRR. No murmurs, rubs, gallops, clicks.  ABDOMEN: Soft, nontender, nondistended. Bowel sounds present. No organomegaly or mass.  EXTREMITIES: No pedal edema, cyanosis, or clubbing. + 2 pedal & radial pulses b/l.   NEUROLOGIC: Cranial nerves II through XII are intact. No focal Motor or sensory deficits appreciated b/l PSYCHIATRIC: The patient is alert and oriented x 3.  SKIN: No obvious rash, lesion, or ulcer.   Left upper Ext. AV fistula with good bruit/thrill.   LABORATORY PANEL:   CBC Recent Labs  Lab 07/27/18 1135  WBC 7.9  HGB 11.4*  HCT 35.8*  PLT 159    ------------------------------------------------------------------------------------------------------------------  Chemistries  Recent Labs  Lab 07/27/18 1135  NA 135  K 6.2*  CL 93*  CO2 23  GLUCOSE 174*  BUN 109*  CREATININE 10.81*  CALCIUM 9.0  AST 24  ALT 26  ALKPHOS 113  BILITOT 0.9   ------------------------------------------------------------------------------------------------------------------  Cardiac Enzymes Recent Labs  Lab 07/27/18 1135  TROPONINI 0.06*   ------------------------------------------------------------------------------------------------------------------  RADIOLOGY:  Ct Abdomen Pelvis Wo Contrast  Result Date: 07/27/2018 CLINICAL DATA:  Diarrhea since Friday. EXAM: CT ABDOMEN AND PELVIS WITHOUT CONTRAST TECHNIQUE: Multidetector CT imaging of the abdomen and pelvis was performed following the standard protocol without IV contrast. COMPARISON:  10/01/2008 FINDINGS: Lower chest: Cardiomegaly with coronary arteriosclerosis. No pericardial effusion. No active pulmonary disease. Hepatobiliary: Granuloma in the left hepatic lobe. Allowing for limitations of a noncontrast study, no mass or biliary dilatation of the liver is identified within the gallbladder is physiologically distended without stones. Pancreas: Normal Spleen: Normal Adrenals/Urinary Tract: Atrophic bilateral kidneys. No nephrolithiasis nor hydroureteronephrosis. No adrenal mass. The urinary bladder is decompressed in appearance and suboptimally assessed Stomach/Bowel: Physiologic  distention of the stomach with ingested oral contrast. There is antegrade flow of contrast through the small intestine into large bowel. Soft tissue density centered within the cecum outlined by oral contrast likely reflects still less likely mass. This can be further correlated with a nonemergent barium enema, repeat CT or direct visual correlation. No mural thickening of the colon allowing for underdistention. The  appendix is not well visualized. Vascular/Lymphatic: Moderate aortoiliac atherosclerosis without aneurysm. No lymphadenopathy by CT size criteria. Reproductive: Top-normal size prostate with central zone calcifications. Other: Mild-to-moderate amount of free intraperitoneal fluid is seen within the abdomen pelvis. Given atrophic appearance of kidneys, findings may be secondary to patient's history of peritoneal dialysis. No definite peritoneal studding or thickening of the omentum is noted to suggest peritoneal disease. Mild diffuse soft tissue anasarca is noted. Musculoskeletal: No acute or significant osseous findings. IMPRESSION: 1. Cardiomegaly with coronary arteriosclerosis. 2. Mild-to-moderate intraperitoneal free fluid with atrophic appearance of the kidneys. Findings may reflect stigmata of peritoneal dialysis. The liver does not appear cirrhotic though potentially findings could represent ascites. No omental thickening or peritoneal studding to suggest peritoneal disease. 3. Soft tissue density centered within the cecum likely represents a stool ball. Repeat imaging, barium enema or direct visual correlation to exclude intraluminal mass though this is felt less likely. 4. No bowel obstruction or definite inflammatory change. Under distended colon likely contributes to the somewhat thickened appearance along the descending and sigmoid colon. Colitis is believed less likely though given history of diarrhea, not entirely excluded. Electronically Signed   By: Ashley Royalty M.D.   On: 07/27/2018 16:31   Dg Chest 2 View  Result Date: 07/27/2018 CLINICAL DATA:  2-3 week history of shortness of breath. Four days of diarrhea. History of diabetes, dialysis dependent renal failure. Nonsmoker. EXAM: CHEST - 2 VIEW COMPARISON:  PA and lateral chest x-ray of Feb 05, 2018. FINDINGS: The lungs are adequately inflated. The interstitial markings are coarse. The pulmonary vascularity is mildly engorged. The cardiac  silhouette is enlarged and larger than that on the previous study. There is calcification in the wall of the aortic arch. The mediastinum is normal in width. The bony thorax exhibits no acute abnormality. IMPRESSION: Low-grade compensated CHF slightly more conspicuous than on the previous study. No alveolar pneumonia. Electronically Signed   By: David  Martinique M.D.   On: 07/27/2018 12:04     IMPRESSION AND PLAN:   62 year old Hispanic male with past medical history of diabetes, end-stage renal disease on hemodialysis, gout, hypertension who presents to the hospital due to diarrhea.  1.  Acute diarrhea-etiology unclear presently.  Patient has had apparently multiple soft stools every day for the past few days.  He denies any recent antibiotic usage or any sick contacts. - CT abdomen pelvis obtained for possible colitis but his clinical symptoms do not correlate with that other than some diarrhea.  I will hold off on giving the patient any aches. - Await stool cultures for C. difficile and comprehensive culture.  If C. difficile is negative would consider starting the patient on some as needed/scheduled Imodium.  2.  Hyperkalemia-secondary to patient missing hemodialysis. - Patient missed his hemodialysis secondary to his diarrhea we will plan for urgent dialysis today.  Discussed with nephrology.  3.  End-stage renal disease on hemodialysis-patient normally gets dialysis on Tuesday Thursday Saturday, patient missed his dialysis on his last session plan for dialysis today. - Nephrology consult has been placed.  4.  Diabetes type 2 with renal complication-  we will place the patient on sliding scale insulin.  Follow blood sugars.  5.  Depression-continue Celexa.  6.  Essential hypertension-continue hydralazine.  7.  History of secondary hyperparathyroidism-continue PhosLo, Rocaltrol.    All the records are reviewed and case discussed with ED provider. Management plans discussed with the  patient, family and they are in agreement.  CODE STATUS: Full code  TOTAL TIME TAKING CARE OF THIS PATIENT: 45 minutes.    Henreitta Leber M.D on 07/27/2018 at 4:56 PM  Between 7am to 6pm - Pager - [phone]  After 6pm go to [email or web address] - password [email or web address]  Office  [phone]  CC: Primary care physician; Patient, No Pcp Per

## 2018-07-28 LAB — GASTROINTESTINAL PANEL BY PCR, STOOL (REPLACES STOOL CULTURE)

## 2018-07-28 LAB — BASIC METABOLIC PANEL
ANION GAP: 14 (ref 5–15)
BUN: 67 mg/dL — AB (ref 8–23)
CO2: 28 mmol/L (ref 22–32)
Calcium: 8.5 mg/dL — ABNORMAL LOW (ref 8.9–10.3)
Chloride: 98 mmol/L (ref 98–111)
Creatinine, Ser: 7.76 mg/dL — ABNORMAL HIGH (ref 0.61–1.24)
GFR calc Af Amer: 8 mL/min — ABNORMAL LOW (ref >60)
GFR, EST NON AFRICAN AMERICAN: 7 mL/min — AB (ref >60)
GLUCOSE: 178 mg/dL — AB (ref 70–99)
POTASSIUM: 4.6 mmol/L (ref 3.5–5.1)
Sodium: 140 mmol/L (ref 135–145)

## 2018-07-28 LAB — GLUCOSE, CAPILLARY
GLUCOSE-CAPILLARY: 134 mg/dL — AB (ref 70–99)
GLUCOSE-CAPILLARY: 205 mg/dL — AB (ref 70–99)
Glucose-Capillary: 195 mg/dL — ABNORMAL HIGH (ref 70–99)

## 2018-07-28 LAB — CBC
HCT: 30 % — ABNORMAL LOW (ref 39.0–52.0)
HEMOGLOBIN: 9.8 g/dL — AB (ref 13.0–17.0)
MCH: 32.1 pg (ref 26.0–34.0)
MCHC: 32.7 g/dL (ref 30.0–36.0)
MCV: 98.4 fL (ref 80.0–100.0)
Platelets: 137 10*3/uL — ABNORMAL LOW (ref 150–400)
RBC: 3.05 MIL/uL — AB (ref 4.22–5.81)
RDW: 14.7 % (ref 11.5–15.5)
WBC: 5.4 10*3/uL (ref 4.0–10.5)
nRBC: 0 % (ref 0.0–0.2)

## 2018-07-28 LAB — C DIFFICILE QUICK SCREEN W PCR REFLEX
C Diff antigen: NEGATIVE
C Diff interpretation: NOT DETECTED
C Diff toxin: NEGATIVE

## 2018-07-28 LAB — MRSA PCR SCREENING: MRSA by PCR: NEGATIVE

## 2018-07-28 MED ORDER — INSULIN ASPART 100 UNIT/ML ~~LOC~~ SOLN
0.0000 [IU] | Freq: Three times a day (TID) | SUBCUTANEOUS | Status: DC
  Administered 2018-07-28 – 2018-07-29 (×2): 2 [IU] via SUBCUTANEOUS
  Filled 2018-07-28 (×2): qty 1

## 2018-07-28 MED ORDER — INSULIN ASPART 100 UNIT/ML ~~LOC~~ SOLN
0.0000 [IU] | Freq: Every day | SUBCUTANEOUS | Status: DC
  Administered 2018-07-28: 2 [IU] via SUBCUTANEOUS
  Filled 2018-07-28: qty 1

## 2018-07-28 MED ORDER — LOPERAMIDE HCL 2 MG PO CAPS
2.0000 mg | ORAL_CAPSULE | Freq: Four times a day (QID) | ORAL | Status: DC
  Administered 2018-07-28 – 2018-07-29 (×5): 2 mg via ORAL
  Filled 2018-07-28 (×5): qty 1

## 2018-07-28 NOTE — Care Management (Signed)
Amanda Morris dialysis liaison notified of admission.    

## 2018-07-28 NOTE — Progress Notes (Signed)
HD tx start    07/28/18 1816  Vital Signs  Pulse Rate 73  Pulse Rate Source Monitor  Resp 16  BP (!) 171/77  BP Location Right Arm  BP Method Automatic  Patient Position (if appropriate) Lying  Oxygen Therapy  SpO2 96 %  O2 Device Room Air  During Hemodialysis Assessment  Blood Flow Rate (mL/min) 400 mL/min  Arterial Pressure (mmHg) -70 mmHg  Venous Pressure (mmHg) 200 mmHg  Transmembrane Pressure (mmHg) 60 mmHg  Ultrafiltration Rate (mL/min) 500 mL/min  Dialysate Flow Rate (mL/min) 600 ml/min  Conductivity: Machine  13.9  HD Safety Checks Performed Yes  Dialysis Fluid Bolus Normal Saline  Bolus Amount (mL) 250 mL  Intra-Hemodialysis Comments Tx initiated  Fistula / Graft Left Upper arm Arteriovenous fistula  No Placement Date or Time found.   Orientation: Left  Access Location: Upper arm  Access Type: Arteriovenous fistula  Status Accessed  Needle Size 15

## 2018-07-28 NOTE — Progress Notes (Signed)
Post HD assessment    07/28/18 2128  Neurological  Level of Consciousness Alert  Orientation Level Oriented X4  Respiratory  Respiratory Pattern Regular;Unlabored  Chest Assessment Chest expansion symmetrical  Cardiac  ECG Monitor Yes  Cardiac Rhythm NSR  Vascular  R Radial Pulse +2  L Radial Pulse +2  Edema Generalized  Integumentary  Integumentary (WDL) X  Skin Color Appropriate for ethnicity  Musculoskeletal  Musculoskeletal (WDL) X  Generalized Weakness Yes  Assistive Device None  GU Assessment  Genitourinary (WDL) X  Genitourinary Symptoms  (HD)  Psychosocial  Psychosocial (WDL) WDL

## 2018-07-28 NOTE — Progress Notes (Signed)
Post HD assessment. Pt tolerated tx well without c/o or complication. Some increased bleeding present at both arterial and venous HD access sites. Net UF 1013, goal met.    07/28/18 2129  Vital Signs  Temp 98.7 F (37.1 C)  Temp Source Oral  Pulse Rate 83  Pulse Rate Source Monitor  Resp 15  BP (!) 177/77  BP Location Right Arm  BP Method Automatic  Patient Position (if appropriate) Lying  Oxygen Therapy  SpO2 97 %  O2 Device Room Air  Dialysis Weight  Weight 102.1 kg  Type of Weight Post-Dialysis  Post-Hemodialysis Assessment  Rinseback Volume (mL) 250 mL  KECN 71.7 V  Dialyzer Clearance Lightly streaked  Duration of HD Treatment -hour(s) 3 hour(s)  Hemodialysis Intake (mL) 500 mL  UF Total -Machine (mL) 1513 mL  Net UF (mL) 1013 mL  Tolerated HD Treatment Yes  AVG/AVF Arterial Site Held (minutes) 15 minutes (increased bleeding)  AVG/AVF Venous Site Held (minutes) 15 minutes (increased bleeding)  Education / Care Plan  Dialysis Education Provided Yes  Documented Education in Care Plan Yes  Fistula / Graft Left Upper arm Arteriovenous fistula  No Placement Date or Time found.   Orientation: Left  Access Location: Upper arm  Access Type: Arteriovenous fistula  Site Condition No complications  Fistula / Graft Assessment Present;Thrill;Bruit  Status Deaccessed  Drainage Description None

## 2018-07-28 NOTE — Progress Notes (Signed)
[facility]  ROUNDING NOTE   Subjective:   Eating better. Wife at bedside.   Hemodialysis yesterday due to hyperkalemia. Tolerated treatment well.   Hemodialysis scheduled for later today.   Objective:  Vital signs in last 24 hours:  Temp:  [97.6 F (36.4 C)-98.5 F (36.9 C)] 98.5 F (36.9 C) (10/30 1132) Pulse Rate:  [58-75] 70 (10/30 1132) Resp:  [11-20] 19 (10/30 1132) BP: (153-192)/(62-95) 173/95 (10/30 1132) SpO2:  [98 %-100 %] 98 % (10/30 1132) Weight:  [100.5 kg] 100.5 kg (10/29 2204)  Weight change:  Filed Weights   07/27/18 1132 07/27/18 2204  Weight: 94 kg 100.5 kg    Intake/Output: I/O last 3 completed shifts: In: 181 [IV Piggyback:181] Out: 503 [Other:503]   Intake/Output this shift:  No intake/output data recorded.  Physical Exam: General: NAD,   Head: Normocephalic, atraumatic. Moist oral mucosal membranes  Eyes: Anicteric, PERRL  Neck: Supple, trachea midline  Lungs:  Clear to auscultation  Heart: Regular rate and rhythm  Abdomen:  Soft, nontender,   Extremities:  peripheral edema.  Neurologic: Nonfocal, moving all four extremities  Skin: No lesions  Access: Left AVF    Basic Metabolic Panel: Recent Labs  Lab 07/27/18 1135 07/28/18 0526  NA 135 140  K 6.2* 4.6  CL 93* 98  CO2 23 28  GLUCOSE 174* 178*  BUN 109* 67*  CREATININE 10.81* 7.76*  CALCIUM 9.0 8.5*    Liver Function Tests: Recent Labs  Lab 07/27/18 1135  AST 24  ALT 26  ALKPHOS 113  BILITOT 0.9  PROT 8.1  ALBUMIN 3.9   Recent Labs  Lab 07/27/18 1135  LIPASE 47   No results for input(s): AMMONIA in the last 168 hours.  CBC: Recent Labs  Lab 07/27/18 1135 07/28/18 0526  WBC 7.9 5.4  HGB 11.4* 9.8*  HCT 35.8* 30.0*  MCV 99.7 98.4  PLT 159 137*    Cardiac Enzymes: Recent Labs  Lab 07/27/18 1135  TROPONINI 0.06*    BNP: Invalid input(s): POCBNP  CBG: Recent Labs  Lab 07/28/18 1129 07/28/18 1621  GLUCAP 195* 134*     Microbiology: Results for orders placed or performed during the hospital encounter of 07/27/18  MRSA PCR Screening     Status: None   Collection Time: 07/27/18 11:24 PM  Result Value Ref Range Status   MRSA by PCR NEGATIVE NEGATIVE Final    Comment:        The GeneXpert MRSA Assay (FDA approved for NASAL specimens only), is one component of a comprehensive MRSA colonization surveillance program. It is not intended to diagnose MRSA infection nor to guide or monitor treatment for MRSA infections. Performed at [facility], [address] [place], [place] [address]   Gastrointestinal Panel by PCR , Stool     Status: None   Collection Time: 07/28/18  1:05 AM  Result Value Ref Range Status   Campylobacter species NOT DETECTED NOT DETECTED Final   Plesimonas shigelloides NOT DETECTED NOT DETECTED Final   Salmonella species NOT DETECTED NOT DETECTED Final   Yersinia enterocolitica NOT DETECTED NOT DETECTED Final   Vibrio species NOT DETECTED NOT DETECTED Final   Vibrio cholerae NOT DETECTED NOT DETECTED Final   Enteroaggregative E coli (EAEC) NOT DETECTED NOT DETECTED Final   Enteropathogenic E coli (EPEC) NOT DETECTED NOT DETECTED Final   Enterotoxigenic E coli (ETEC) NOT DETECTED NOT DETECTED Final   Shiga like toxin producing E coli (STEC) NOT DETECTED NOT DETECTED Final  Shigella/Enteroinvasive E coli (EIEC) NOT DETECTED NOT DETECTED Final   Cryptosporidium NOT DETECTED NOT DETECTED Final   Cyclospora cayetanensis NOT DETECTED NOT DETECTED Final   Entamoeba histolytica NOT DETECTED NOT DETECTED Final   Giardia lamblia NOT DETECTED NOT DETECTED Final   Adenovirus F40/41 NOT DETECTED NOT DETECTED Final   Astrovirus NOT DETECTED NOT DETECTED Final   Norovirus GI/GII NOT DETECTED NOT DETECTED Final   Rotavirus A NOT DETECTED NOT DETECTED Final   Sapovirus (I, II, IV, and V) NOT DETECTED NOT DETECTED Final    Comment: Performed at [facility], [address] [place], [place] [address]  C difficile quick scan w PCR reflex     Status: None   Collection Time: 07/28/18  1:05 AM  Result Value Ref Range Status   C Diff antigen NEGATIVE NEGATIVE Final   C Diff toxin NEGATIVE NEGATIVE Final   C Diff interpretation No C. difficile detected.  Final    Comment: Performed at [facility], [address] [place], [place] [address]    Coagulation Studies: No results for input(s): LABPROT, INR in the last 72 hours.  Urinalysis: No results for input(s): COLORURINE, LABSPEC, PHURINE, GLUCOSEU, HGBUR, BILIRUBINUR, KETONESUR, PROTEINUR, UROBILINOGEN, NITRITE, LEUKOCYTESUR in the last 72 hours.  Invalid input(s): APPERANCEUR    Imaging: Ct Abdomen Pelvis Wo Contrast  Result Date: 07/27/2018 CLINICAL DATA:  Diarrhea since Friday. EXAM: CT ABDOMEN AND PELVIS WITHOUT CONTRAST TECHNIQUE: Multidetector CT imaging of the abdomen and pelvis was performed following the standard protocol without IV contrast. COMPARISON:  10/01/2008 FINDINGS: Lower chest: Cardiomegaly with coronary arteriosclerosis. No pericardial effusion. No active pulmonary disease. Hepatobiliary: Granuloma in the left hepatic lobe. Allowing for limitations of a noncontrast study, no mass or biliary dilatation of the liver is identified within the gallbladder is physiologically distended without stones. Pancreas: Normal Spleen: Normal Adrenals/Urinary Tract: Atrophic bilateral kidneys. No nephrolithiasis nor hydroureteronephrosis. No adrenal mass. The urinary bladder is decompressed in appearance and suboptimally assessed Stomach/Bowel: Physiologic distention of the stomach with ingested oral contrast. There is antegrade flow of contrast through the small intestine into large bowel. Soft tissue density centered within the cecum outlined by oral contrast likely reflects still less likely mass. This can be further correlated with a nonemergent barium enema, repeat CT or direct visual  correlation. No mural thickening of the colon allowing for underdistention. The appendix is not well visualized. Vascular/Lymphatic: Moderate aortoiliac atherosclerosis without aneurysm. No lymphadenopathy by CT size criteria. Reproductive: Top-normal size prostate with central zone calcifications. Other: Mild-to-moderate amount of free intraperitoneal fluid is seen within the abdomen pelvis. Given atrophic appearance of kidneys, findings may be secondary to patient's history of peritoneal dialysis. No definite peritoneal studding or thickening of the omentum is noted to suggest peritoneal disease. Mild diffuse soft tissue anasarca is noted. Musculoskeletal: No acute or significant osseous findings. IMPRESSION: 1. Cardiomegaly with coronary arteriosclerosis. 2. Mild-to-moderate intraperitoneal free fluid with atrophic appearance of the kidneys. Findings may reflect stigmata of peritoneal dialysis. The liver does not appear cirrhotic though potentially findings could represent ascites. No omental thickening or peritoneal studding to suggest peritoneal disease. 3. Soft tissue density centered within the cecum likely represents a stool ball. Repeat imaging, barium enema or direct visual correlation to exclude intraluminal mass though this is felt less likely. 4. No bowel obstruction or definite inflammatory change. Under distended colon likely contributes to the somewhat thickened appearance along the descending and sigmoid colon. Colitis is believed less likely though given history of  diarrhea, not entirely excluded. Electronically Signed   By: Ashley Royalty M.D.   On: 07/27/2018 16:31   Dg Chest 2 View  Result Date: 07/27/2018 CLINICAL DATA:  2-3 week history of shortness of breath. Four days of diarrhea. History of diabetes, dialysis dependent renal failure. Nonsmoker. EXAM: CHEST - 2 VIEW COMPARISON:  PA and lateral chest x-ray of Feb 05, 2018. FINDINGS: The lungs are adequately inflated. The interstitial  markings are coarse. The pulmonary vascularity is mildly engorged. The cardiac silhouette is enlarged and larger than that on the previous study. There is calcification in the wall of the aortic arch. The mediastinum is normal in width. The bony thorax exhibits no acute abnormality. IMPRESSION: Low-grade compensated CHF slightly more conspicuous than on the previous study. No alveolar pneumonia. Electronically Signed   By: David  Martinique M.D.   On: 07/27/2018 12:04     Medications:       Assessment/ Plan:  [name] is a 62 y.o. hispanic male with end stage renal disease on hemodialysis, insulin dependent diabetes mellitus type 2, gout, hypertension, anemia of chronic kidney disease, secondary hyperparathyroidism,depression  [facility] [place] Left AVF  1.  End-stage renal disease with hyperkalemia:  Emergent hemodialysis on admission - Hemodialysis for later today.   2.  Anemia of chronic kidney disease: hemoglobin 9.8 - Mircera as outpatient.   3. Secondary Hyperparathyroidism:  - calcium acetate with meals.   4. Hypertension:  Home regimen of amlodipine and lisinopril.    LOS: 1 Shaquel Chavous 10/30/20195:17 PM

## 2018-07-28 NOTE — Progress Notes (Signed)
Notified MD of GI panel which detects sapovirus. No new orders.

## 2018-07-28 NOTE — Progress Notes (Signed)
Pre HD assessment    07/28/18 1808  Neurological  Level of Consciousness Alert  Orientation Level Oriented X4  Respiratory  Respiratory Pattern Regular;Unlabored  Chest Assessment Chest expansion symmetrical  Cardiac  ECG Monitor Yes  Cardiac Rhythm NSR  Vascular  R Radial Pulse +2  L Radial Pulse +2  Integumentary  Integumentary (WDL) X  Skin Color Appropriate for ethnicity  Musculoskeletal  Musculoskeletal (WDL) X  Generalized Weakness Yes  Assistive Device None  GU Assessment  Genitourinary (WDL) X  Genitourinary Symptoms  (HD)  Psychosocial  Psychosocial (WDL) WDL

## 2018-07-28 NOTE — Progress Notes (Signed)
Pre HD assessment    07/28/18 1807  Vital Signs  Temp 98.3 F (36.8 C)  Temp Source Oral  Pulse Rate 72  Pulse Rate Source Monitor  Resp (!) 21  BP (!) 172/84  BP Location Right Arm  BP Method Automatic  Patient Position (if appropriate) Lying  Oxygen Therapy  SpO2 96 %  O2 Device Room Air  Pain Assessment  Pain Scale 0-10  Pain Score 0  Dialysis Weight  Weight 104.5 kg  Type of Weight Pre-Dialysis  Time-Out for Hemodialysis  What Procedure? HD  Pt Identifiers(min of two) First/Last Name;MRN/Account#  Correct Site? Yes  Correct Side? Yes  Correct Procedure? Yes  Consents Verified? Yes  Rad Studies Available? N/A  Safety Precautions Reviewed? Yes  Engineer, civil (consulting) Number  ([id number])  Station Number 4  UF/Alarm Test Passed  Conductivity: Meter 13.6  Conductivity: Machine  13.8  pH 7.6  Reverse Osmosis main  Normal Saline Lot Number [id number]  Dialyzer Lot Number [id number]  Disposable Set Lot Number [id number]  Machine Temperature 98.6 F (37 C)  Musician and Audible Yes  Blood Lines Intact and Secured Yes  Pre Treatment Patient Checks  Vascular access used during treatment Fistula  Hepatitis B Surface Antigen Results Negative  Date Hepatitis B Surface Antigen Drawn 06/02/18  Hepatitis B Surface Antibody  (<10)  Date Hepatitis B Surface Antibody Drawn 06/02/18  Hemodialysis Consent Verified Yes  Hemodialysis Standing Orders Initiated Yes  ECG (Telemetry) Monitor On Yes  Prime Ordered Normal Saline  Length of  DialysisTreatment -hour(s) 3 Hour(s)  Dialyzer Elisio 17H NR  Dialysate 2K, 2.5 Ca  Dialysis Anticoagulant None  Dialysate Flow Ordered 600  Blood Flow Rate Ordered 400 mL/min  Ultrafiltration Goal 1 Liters  Dialysis Blood Pressure Support Ordered Normal Saline  Education / Care Plan  Dialysis Education Provided Yes  Documented Education in Care Plan Yes  Fistula / Graft Left Upper arm Arteriovenous fistula  No Placement Date or Time found.    Orientation: Left  Access Location: Upper arm  Access Type: Arteriovenous fistula  Site Condition No complications  Fistula / Graft Assessment Present;Thrill;Bruit  Drainage Description None

## 2018-07-28 NOTE — Progress Notes (Signed)
HD TX END    07/28/18 2120  Vital Signs  Pulse Rate 82  Pulse Rate Source Monitor  Resp (!) 21  BP (!) 185/79  BP Location Right Arm  BP Method Automatic  Patient Position (if appropriate) Lying  Oxygen Therapy  SpO2 97 %  O2 Device Room Air  During Hemodialysis Assessment  Dialysis Fluid Bolus Normal Saline  Bolus Amount (mL) 250 mL  Intra-Hemodialysis Comments Tx completed

## 2018-07-28 NOTE — Progress Notes (Signed)
[facility] [place] NAME: Danny [name]    MR#:  [id number]  DATE OF BIRTH:  Dec 19, 1955  SUBJECTIVE:   Pt. Here due to acute diarrhea and having missed his HD.  Still having some Diarrhea.  Patient stool for C. difficile and comprehensive culture are negative.  Had dialysis yesterday, hyperkalemia has improved.  REVIEW OF SYSTEMS:    Review of Systems  Constitutional: Negative for chills and fever.  HENT: Negative for congestion and tinnitus.   Eyes: Negative for blurred vision and double vision.  Respiratory: Negative for cough, shortness of breath and wheezing.   Cardiovascular: Negative for chest pain, orthopnea and PND.  Gastrointestinal: Positive for diarrhea. Negative for abdominal pain, nausea and vomiting.  Genitourinary: Negative for dysuria and hematuria.  Neurological: Negative for dizziness, sensory change and focal weakness.  All other systems reviewed and are negative.   Nutrition: Clear liquid but will advance to Renal/Carb control today Tolerating Diet: yes Tolerating PT: Ambulatory  DRUG ALLERGIES:  No Known Allergies  VITALS:  Blood pressure (!) 173/95, pulse 70, temperature 98.5 F (36.9 C), temperature source Oral, resp. rate 19, height 5\' 8"  (1.727 m), weight 100.5 kg, SpO2 98 %.  PHYSICAL EXAMINATION:   Physical Exam  GENERAL:  62 y.o.-year-old patient lying in bed in no acute distress.  EYES: Pupils equal, round, reactive to light and accommodation. No scleral icterus. Extraocular muscles intact.  HEENT: Head atraumatic, normocephalic. Oropharynx and nasopharynx clear.  NECK:  Supple, no jugular venous distention. No thyroid enlargement, no tenderness.  LUNGS: Normal breath sounds bilaterally, no wheezing, rales, rhonchi. No use of accessory muscles of respiration.  CARDIOVASCULAR: S1, S2 normal. No murmurs, rubs, or gallops.  ABDOMEN: Soft, nontender, nondistended. Bowel sounds present. No  organomegaly or mass.  EXTREMITIES: No cyanosis, clubbing or edema b/l.    NEUROLOGIC: Cranial nerves II through XII are intact. No focal Motor or sensory deficits b/l.   PSYCHIATRIC: The patient is alert and oriented x 3.  SKIN: No obvious rash, lesion, or ulcer.   Left upper ext. AV fistula with good bruit/thrill.   LABORATORY PANEL:   CBC Recent Labs  Lab 07/28/18 0526  WBC 5.4  HGB 9.8*  HCT 30.0*  PLT 137*   ------------------------------------------------------------------------------------------------------------------  Chemistries  Recent Labs  Lab 07/27/18 1135 07/28/18 0526  NA 135 140  K 6.2* 4.6  CL 93* 98  CO2 23 28  GLUCOSE 174* 178*  BUN 109* 67*  CREATININE 10.81* 7.76*  CALCIUM 9.0 8.5*  AST 24  --   ALT 26  --   ALKPHOS 113  --   BILITOT 0.9  --    ------------------------------------------------------------------------------------------------------------------  Cardiac Enzymes Recent Labs  Lab 07/27/18 1135  TROPONINI 0.06*   ------------------------------------------------------------------------------------------------------------------  RADIOLOGY:  Ct Abdomen Pelvis Wo Contrast  Result Date: 07/27/2018 CLINICAL DATA:  Diarrhea since Friday. EXAM: CT ABDOMEN AND PELVIS WITHOUT CONTRAST TECHNIQUE: Multidetector CT imaging of the abdomen and pelvis was performed following the standard protocol without IV contrast. COMPARISON:  10/01/2008 FINDINGS: Lower chest: Cardiomegaly with coronary arteriosclerosis. No pericardial effusion. No active pulmonary disease. Hepatobiliary: Granuloma in the left hepatic lobe. Allowing for limitations of a noncontrast study, no mass or biliary dilatation of the liver is identified within the gallbladder is physiologically distended without stones. Pancreas: Normal Spleen: Normal Adrenals/Urinary Tract: Atrophic bilateral kidneys. No nephrolithiasis nor hydroureteronephrosis. No adrenal mass. The urinary bladder is  decompressed in appearance and suboptimally assessed  Stomach/Bowel: Physiologic distention of the stomach with ingested oral contrast. There is antegrade flow of contrast through the small intestine into large bowel. Soft tissue density centered within the cecum outlined by oral contrast likely reflects still less likely mass. This can be further correlated with a nonemergent barium enema, repeat CT or direct visual correlation. No mural thickening of the colon allowing for underdistention. The appendix is not well visualized. Vascular/Lymphatic: Moderate aortoiliac atherosclerosis without aneurysm. No lymphadenopathy by CT size criteria. Reproductive: Top-normal size prostate with central zone calcifications. Other: Mild-to-moderate amount of free intraperitoneal fluid is seen within the abdomen pelvis. Given atrophic appearance of kidneys, findings may be secondary to patient's history of peritoneal dialysis. No definite peritoneal studding or thickening of the omentum is noted to suggest peritoneal disease. Mild diffuse soft tissue anasarca is noted. Musculoskeletal: No acute or significant osseous findings. IMPRESSION: 1. Cardiomegaly with coronary arteriosclerosis. 2. Mild-to-moderate intraperitoneal free fluid with atrophic appearance of the kidneys. Findings may reflect stigmata of peritoneal dialysis. The liver does not appear cirrhotic though potentially findings could represent ascites. No omental thickening or peritoneal studding to suggest peritoneal disease. 3. Soft tissue density centered within the cecum likely represents a stool ball. Repeat imaging, barium enema or direct visual correlation to exclude intraluminal mass though this is felt less likely. 4. No bowel obstruction or definite inflammatory change. Under distended colon likely contributes to the somewhat thickened appearance along the descending and sigmoid colon. Colitis is believed less likely though given history of diarrhea, not  entirely excluded. Electronically Signed   By: Ashley Royalty M.D.   On: 07/27/2018 16:31   Dg Chest 2 View  Result Date: 07/27/2018 CLINICAL DATA:  2-3 week history of shortness of breath. Four days of diarrhea. History of diabetes, dialysis dependent renal failure. Nonsmoker. EXAM: CHEST - 2 VIEW COMPARISON:  PA and lateral chest x-ray of Feb 05, 2018. FINDINGS: The lungs are adequately inflated. The interstitial markings are coarse. The pulmonary vascularity is mildly engorged. The cardiac silhouette is enlarged and larger than that on the previous study. There is calcification in the wall of the aortic arch. The mediastinum is normal in width. The bony thorax exhibits no acute abnormality. IMPRESSION: Low-grade compensated CHF slightly more conspicuous than on the previous study. No alveolar pneumonia. Electronically Signed   By: David  Martinique M.D.   On: 07/27/2018 12:04     ASSESSMENT AND PLAN:   62 year old Hispanic male with past medical history of diabetes, end-stage renal disease on hemodialysis, gout, hypertension who presents to the hospital due to diarrhea.  1.  Acute diarrhea- etiology unclear.  Patient has not been on recent antibiotics.  Stool for C. difficile and comprehensive cultures presently negative. - We will start on some scheduled Imodium, and if not improving consider gastroenterology consult.  CT abdomen pelvis did not show any evidence of acute pathology.  2.  Hyperkalemia-secondary to patient missing hemodialysis. -Much improved with hemodialysis yesterday.  We will continue to monitor.  3.  End-stage renal disease on hemodialysis-neurology has been consulted, patient had dialysis yesterday as he missed his dialysis on Monday. -Continue dialysis on a Monday Wednesday Friday schedule.  4.  Diabetes type 2 with renal complication- sliding scale insulin, follow blood sugars which are stable presently.  5.  Depression-continue Celexa.  6.  Essential hypertension-  cont. Norvasc, Lisinopril.   7.  History of secondary hyperparathyroidism-continue PhosLo, Rocaltrol.    All the records are reviewed and case discussed with Care Management/Social Worker. Management  plans discussed with the patient, family and they are in agreement.  CODE STATUS: Full code  DVT Prophylaxis: Hep SQ  TOTAL TIME TAKING CARE OF THIS PATIENT: 30 minutes.   POSSIBLE D/C IN 1-2 DAYS, DEPENDING ON CLINICAL CONDITION.   Henreitta Leber M.D on 07/28/2018 at 3:10 PM  Between 7am to 6pm - Pager - [phone]  After 6pm go to [email or web address] - Proofreader  [facility]  Office  [phone]  CC: Primary care physician; Patient, No Pcp Per

## 2018-07-29 ENCOUNTER — Ambulatory Visit: Admit: 2018-07-29 | Discharge: 2018-07-30

## 2018-07-29 LAB — BASIC METABOLIC PANEL
ANION GAP: 9 (ref 5–15)
BUN: 37 mg/dL — ABNORMAL HIGH (ref 8–23)
CO2: 30 mmol/L (ref 22–32)
Calcium: 8.4 mg/dL — ABNORMAL LOW (ref 8.9–10.3)
Chloride: 100 mmol/L (ref 98–111)
Creatinine, Ser: 5.44 mg/dL — ABNORMAL HIGH (ref 0.61–1.24)
GFR calc Af Amer: 12 mL/min — ABNORMAL LOW (ref >60)
GFR calc non Af Amer: 10 mL/min — ABNORMAL LOW (ref >60)
Glucose, Bld: 222 mg/dL — ABNORMAL HIGH (ref 70–99)
POTASSIUM: 4.4 mmol/L (ref 3.5–5.1)
Sodium: 139 mmol/L (ref 135–145)

## 2018-07-29 LAB — CBC
HEMATOCRIT: 30.3 % — AB (ref 39.0–52.0)
HEMOGLOBIN: 9.6 g/dL — AB (ref 13.0–17.0)
MCH: 31.9 pg (ref 26.0–34.0)
MCHC: 31.7 g/dL (ref 30.0–36.0)
MCV: 100.7 fL — ABNORMAL HIGH (ref 80.0–100.0)
NRBC: 0 % (ref 0.0–0.2)
Platelets: 129 10*3/uL — ABNORMAL LOW (ref 150–400)
RBC: 3.01 MIL/uL — AB (ref 4.22–5.81)
RDW: 14.8 % (ref 11.5–15.5)
WBC: 5 10*3/uL (ref 4.0–10.5)

## 2018-07-29 LAB — HEPATITIS B SURFACE ANTIGEN: HEP B S AG: NEGATIVE

## 2018-07-29 LAB — GLUCOSE, CAPILLARY
GLUCOSE-CAPILLARY: 177 mg/dL — AB (ref 70–99)
Glucose-Capillary: 118 mg/dL — ABNORMAL HIGH (ref 70–99)

## 2018-07-29 MED ORDER — LABETALOL HCL 5 MG/ML IV SOLN
10.0000 mg | INTRAVENOUS | Status: DC | PRN
  Administered 2018-07-29: 10 mg via INTRAVENOUS
  Filled 2018-07-29: qty 4

## 2018-07-29 MED ORDER — LOPERAMIDE HCL 2 MG PO CAPS: 2.0000 mg | ORAL_CAPSULE | Freq: Four times a day (QID) | ORAL | 0 refills | Status: DC | PRN

## 2018-07-29 NOTE — Care Management (Signed)
Patient to discharge today. PCP [facility], unable to update in Epic. Elvera Bicker dialysis liaison notified of discharge. No new prescriptions at discharge.

## 2018-07-29 NOTE — Progress Notes (Signed)
[facility]  ROUNDING NOTE   Subjective:   Wife at bedside.  Hemodialysis treatment yesterday. Tolerated treatment well.   Objective:  Vital signs in last 24 hours:  Temp:  [98.3 F (36.8 C)-98.7 F (37.1 C)] 98.3 F (36.8 C) (10/31 1145) Pulse Rate:  [64-83] 70 (10/31 1145) Resp:  [13-21] 14 (10/31 1145) BP: (161-185)/(66-84) 172/81 (10/31 1145) SpO2:  [95 %-99 %] 97 % (10/31 1145) Weight:  [102.1 kg-104.5 kg] 102.1 kg (10/30 2129)  Weight change: 10.5 kg Filed Weights   07/27/18 2204 07/28/18 1807 07/28/18 2129  Weight: 100.5 kg 104.5 kg 102.1 kg    Intake/Output: I/O last 3 completed shifts: In: -  Out: 1516 [Other:1516]   Intake/Output this shift:  Total I/O In: 240 [P.O.:240] Out: -   Physical Exam: General: NAD,   Head: Normocephalic, atraumatic. Moist oral mucosal membranes  Eyes: Anicteric, PERRL  Neck: Supple, trachea midline  Lungs:  Clear to auscultation  Heart: Regular rate and rhythm  Abdomen:  Soft, nontender,   Extremities:  peripheral edema.  Neurologic: Nonfocal, moving all four extremities  Skin: No lesions  Access: Left AVF    Basic Metabolic Panel: Recent Labs  Lab 07/27/18 1135 07/28/18 0526 07/29/18 0307  NA 135 140 139  K 6.2* 4.6 4.4  CL 93* 98 100  CO2 23 28 30   GLUCOSE 174* 178* 222*  BUN 109* 67* 37*  CREATININE 10.81* 7.76* 5.44*  CALCIUM 9.0 8.5* 8.4*    Liver Function Tests: Recent Labs  Lab 07/27/18 1135  AST 24  ALT 26  ALKPHOS 113  BILITOT 0.9  PROT 8.1  ALBUMIN 3.9   Recent Labs  Lab 07/27/18 1135  LIPASE 47   No results for input(s): AMMONIA in the last 168 hours.  CBC: Recent Labs  Lab 07/27/18 1135 07/28/18 0526 07/29/18 0307  WBC 7.9 5.4 5.0  HGB 11.4* 9.8* 9.6*  HCT 35.8* 30.0* 30.3*  MCV 99.7 98.4 100.7*  PLT 159 137* 129*    Cardiac Enzymes: Recent Labs  Lab 07/27/18 1135  TROPONINI 0.06*    BNP: Invalid input(s): POCBNP  CBG: Recent Labs  Lab  07/28/18 1129 07/28/18 1621 07/28/18 2316 07/29/18 0759 07/29/18 1142  GLUCAP 195* 134* 205* 118* 177*    Microbiology: Results for orders placed or performed during the hospital encounter of 07/27/18  MRSA PCR Screening     Status: None   Collection Time: 07/27/18 11:24 PM  Result Value Ref Range Status   MRSA by PCR NEGATIVE NEGATIVE Final    Comment:        The GeneXpert MRSA Assay (FDA approved for NASAL specimens only), is one component of a comprehensive MRSA colonization surveillance program. It is not intended to diagnose MRSA infection nor to guide or monitor treatment for MRSA infections. Performed at [facility], [address] [place], [place] [address]   Gastrointestinal Panel by PCR , Stool     Status: None   Collection Time: 07/28/18  1:05 AM  Result Value Ref Range Status   Campylobacter species NOT DETECTED NOT DETECTED Final   Plesimonas shigelloides NOT DETECTED NOT DETECTED Final   Salmonella species NOT DETECTED NOT DETECTED Final   Yersinia enterocolitica NOT DETECTED NOT DETECTED Final   Vibrio species NOT DETECTED NOT DETECTED Final   Vibrio cholerae NOT DETECTED NOT DETECTED Final   Enteroaggregative E coli (EAEC) NOT DETECTED NOT DETECTED Final   Enteropathogenic E coli (EPEC) NOT DETECTED NOT DETECTED Final  Enterotoxigenic E coli (ETEC) NOT DETECTED NOT DETECTED Final   Shiga like toxin producing E coli (STEC) NOT DETECTED NOT DETECTED Final   Shigella/Enteroinvasive E coli (EIEC) NOT DETECTED NOT DETECTED Final   Cryptosporidium NOT DETECTED NOT DETECTED Final   Cyclospora cayetanensis NOT DETECTED NOT DETECTED Final   Entamoeba histolytica NOT DETECTED NOT DETECTED Final   Giardia lamblia NOT DETECTED NOT DETECTED Final   Adenovirus F40/41 NOT DETECTED NOT DETECTED Final   Astrovirus NOT DETECTED NOT DETECTED Final   Norovirus GI/GII NOT DETECTED NOT DETECTED Final   Rotavirus A NOT DETECTED NOT DETECTED Final   Sapovirus (I,  II, IV, and V) NOT DETECTED NOT DETECTED Final    Comment: Performed at [facility], [address] [place], [place] [address]  C difficile quick scan w PCR reflex     Status: None   Collection Time: 07/28/18  1:05 AM  Result Value Ref Range Status   C Diff antigen NEGATIVE NEGATIVE Final   C Diff toxin NEGATIVE NEGATIVE Final   C Diff interpretation No C. difficile detected.  Final    Comment: Performed at [facility], [address] [place], [place] [address]    Coagulation Studies: No results for input(s): LABPROT, INR in the last 72 hours.  Urinalysis: No results for input(s): COLORURINE, LABSPEC, PHURINE, GLUCOSEU, HGBUR, BILIRUBINUR, KETONESUR, PROTEINUR, UROBILINOGEN, NITRITE, LEUKOCYTESUR in the last 72 hours.  Invalid input(s): APPERANCEUR    Imaging: Ct Abdomen Pelvis Wo Contrast  Result Date: 07/27/2018 CLINICAL DATA:  Diarrhea since Friday. EXAM: CT ABDOMEN AND PELVIS WITHOUT CONTRAST TECHNIQUE: Multidetector CT imaging of the abdomen and pelvis was performed following the standard protocol without IV contrast. COMPARISON:  10/01/2008 FINDINGS: Lower chest: Cardiomegaly with coronary arteriosclerosis. No pericardial effusion. No active pulmonary disease. Hepatobiliary: Granuloma in the left hepatic lobe. Allowing for limitations of a noncontrast study, no mass or biliary dilatation of the liver is identified within the gallbladder is physiologically distended without stones. Pancreas: Normal Spleen: Normal Adrenals/Urinary Tract: Atrophic bilateral kidneys. No nephrolithiasis nor hydroureteronephrosis. No adrenal mass. The urinary bladder is decompressed in appearance and suboptimally assessed Stomach/Bowel: Physiologic distention of the stomach with ingested oral contrast. There is antegrade flow of contrast through the small intestine into large bowel. Soft tissue density centered within the cecum outlined by oral contrast likely reflects still less  likely mass. This can be further correlated with a nonemergent barium enema, repeat CT or direct visual correlation. No mural thickening of the colon allowing for underdistention. The appendix is not well visualized. Vascular/Lymphatic: Moderate aortoiliac atherosclerosis without aneurysm. No lymphadenopathy by CT size criteria. Reproductive: Top-normal size prostate with central zone calcifications. Other: Mild-to-moderate amount of free intraperitoneal fluid is seen within the abdomen pelvis. Given atrophic appearance of kidneys, findings may be secondary to patient's history of peritoneal dialysis. No definite peritoneal studding or thickening of the omentum is noted to suggest peritoneal disease. Mild diffuse soft tissue anasarca is noted. Musculoskeletal: No acute or significant osseous findings. IMPRESSION: 1. Cardiomegaly with coronary arteriosclerosis. 2. Mild-to-moderate intraperitoneal free fluid with atrophic appearance of the kidneys. Findings may reflect stigmata of peritoneal dialysis. The liver does not appear cirrhotic though potentially findings could represent ascites. No omental thickening or peritoneal studding to suggest peritoneal disease. 3. Soft tissue density centered within the cecum likely represents a stool ball. Repeat imaging, barium enema or direct visual correlation to exclude intraluminal mass though this is felt less likely. 4. No bowel obstruction or definite inflammatory change.  Under distended colon likely contributes to the somewhat thickened appearance along the descending and sigmoid colon. Colitis is believed less likely though given history of diarrhea, not entirely excluded. Electronically Signed   By: Ashley Royalty M.D.   On: 07/27/2018 16:31     Medications:       Assessment/ Plan:  [name] is a 62 y.o. hispanic male with end stage renal disease on hemodialysis, insulin dependent diabetes mellitus type 2, gout, hypertension, anemia of chronic kidney  disease, secondary hyperparathyroidism,depression  MWF [facility] [place] Left AVF  1.  End-stage renal disease with hyperkalemia:  Emergent hemodialysis on admission - MWF schedule.   2.  Anemia of chronic kidney disease:   - Mircera as outpatient.   3. Secondary Hyperparathyroidism:  - calcium acetate with meals.   4. Hypertension:  Home regimen of amlodipine and lisinopril.    LOS: 2 Jennetta Flood 10/31/20191:45 PM

## 2018-07-29 NOTE — Discharge Summary (Signed)
[place] [facility] [place] NAME: Danny [name]    MR#:  [id number]  DATE OF BIRTH:  12-14-1955  DATE OF ADMISSION:  07/27/2018 ADMITTING PHYSICIAN: Henreitta Leber, MD  DATE OF DISCHARGE: 07/29/2018  PRIMARY CARE PHYSICIAN: Patient, No Pcp Per    ADMISSION DIAGNOSIS:  Hyperkalemia [I33.8] Dialysis complication, initial encounter [T82.9XXA]  DISCHARGE DIAGNOSIS:  Active Problems:   Acute diarrhea   SECONDARY DIAGNOSIS:   Past Medical History:  Diagnosis Date  . Diabetes mellitus without complication ([place])   . Dialysis patient [facility])    tuesday, thursday and saturday  . Gout   . Hypertension   . Kidney disease     HOSPITAL COURSE:   62 year old Hispanic male with past medical history of diabetes, end-stage renal disease on hemodialysis, gout, hypertension who presents to the hospital due to diarrhea.  1. Acute diarrhea-  patient presented to the hospital with multiple episodes of diarrhea.  Patient had stool for comprehensive culture and C. difficile checked which were negative.  Patient was placed on some scheduled Imodium and his diarrhea since then has improved and resolved now. -Advised the patient if he were to get recurrent problems with this he would benefit from outpatient gastroenterology input.    2. Hyperkalemia-secondary to patient missing hemodialysis. -Patient received hemodialysis while in the hospital and his potassium level is now normal.  3. End-stage renal disease on hemodialysis-allergy was consulted and patient received dialysis on his Monday Wednesday Friday schedule and will resume that upon discharge.  4. Diabetes type 2 withrenal complication- patient will resume his Victoza, Novolin 70/30 mix upon discharge.  5. Depression-he will continue Celexa.  6. Essential hypertension- he will cont. Norvasc, Lisinopril.   7. History of secondary hyperparathyroidism-he will continue PhosLo,  Rocaltrol.  DISCHARGE CONDITIONS:   Stable.   CONSULTS OBTAINED:  Treatment Team:  Lavonia Dana, MD  DRUG ALLERGIES:  No Known Allergies  DISCHARGE MEDICATIONS:   Allergies as of 07/29/2018   No Known Allergies     Medication List    STOP taking these medications   albuterol 108 (90 Base) MCG/ACT inhaler Commonly known as:  PROVENTIL HFA;VENTOLIN HFA   azithromycin 250 MG tablet Commonly known as:  ZITHROMAX   guaiFENesin-codeine 100-10 MG/5ML syrup     TAKE these medications   amLODipine 5 MG tablet Commonly known as:  NORVASC Take 5 mg by mouth daily.   calcium acetate 667 MG capsule Commonly known as:  PHOSLO Take 1,334-2,668 mg by mouth See admin instructions. Take 4 capsules (2668MG ) by mouth 3 times daily with meals and 2 capsules (1334MG ) by mouth 2 times daily with snacks   citalopram 20 MG tablet Commonly known as:  CELEXA Take 20 mg by mouth daily.   insulin aspart protamine- aspart (70-30) 100 UNIT/ML injection Commonly known as:  NOVOLOG MIX 70/30 Inject 10-12 Units into the skin See admin instructions. Inject 12u under the skin every morning and 10u under the skin at night   liraglutide 18 MG/3ML Sopn Commonly known as:  VICTOZA Inject 0.6 mg into the skin daily.   lisinopril 20 MG tablet Commonly known as:  PRINIVIL,ZESTRIL Take 20 mg by mouth daily.   loperamide 2 MG capsule Commonly known as:  IMODIUM Take 1 capsule (2 mg total) by mouth every 6 (six) hours as needed for diarrhea or loose stools.   omeprazole 20 MG tablet Commonly known as:  PRILOSEC OTC Take 20 mg by mouth daily.  sevelamer carbonate 800 MG tablet Commonly known as:  RENVELA Take 3,200 mg by mouth 3 (three) times daily with meals.         DISCHARGE INSTRUCTIONS:   DIET:  Cardiac diet, Diabetic diet and Renal diet  DISCHARGE CONDITION:  Stable  ACTIVITY:  Activity as tolerated  OXYGEN:  Home Oxygen: No.   Oxygen Delivery: room air  DISCHARGE  LOCATION:  home   If you experience worsening of your admission symptoms, develop shortness of breath, life threatening emergency, suicidal or homicidal thoughts you must seek medical attention immediately by calling 911 or calling your MD immediately  if symptoms less severe.  You Must read complete instructions/literature along with all the possible adverse reactions/side effects for all the Medicines you take and that have been prescribed to you. Take any new Medicines after you have completely understood and accpet all the possible adverse reactions/side effects.   Please note  You were cared for by a hospitalist during your hospital stay. If you have any questions about your discharge medications or the care you received while you were in the hospital after you are discharged, you can call the unit and asked to speak with the hospitalist on call if the hospitalist that took care of you is not available. Once you are discharged, your primary care physician will handle any further medical issues. Please note that NO REFILLS for any discharge medications will be authorized once you are discharged, as it is imperative that you return to your primary care physician (or establish a relationship with a primary care physician if you do not have one) for your aftercare needs so that they can reassess your need for medications and monitor your lab values.     Today   His diarrhea is much improved, he is afebrile and hemodynamically stable.  Tolerated hemodialysis well yesterday.  Will discharge home today.  VITAL SIGNS:  Blood pressure (!) 172/81, pulse 70, temperature 98.3 F (36.8 C), temperature source Oral, resp. rate 14, height 5\' 8"  (1.727 m), weight 102.1 kg, SpO2 97 %.  I/O:    Intake/Output Summary (Last 24 hours) at 07/29/2018 1546 Last data filed at 07/29/2018 1418 Gross per 24 hour  Intake 360 ml  Output 1013 ml  Net -653 ml    PHYSICAL EXAMINATION:  GENERAL:  62  y.o.-year-old patient lying in the bed with no acute distress.  EYES: Pupils equal, round, reactive to light and accommodation. No scleral icterus. Extraocular muscles intact.  HEENT: Head atraumatic, normocephalic. Oropharynx and nasopharynx clear.  NECK:  Supple, no jugular venous distention. No thyroid enlargement, no tenderness.  LUNGS: Normal breath sounds bilaterally, no wheezing, rales,rhonchi. No use of accessory muscles of respiration.  CARDIOVASCULAR: S1, S2 normal. No murmurs, rubs, or gallops.  ABDOMEN: Soft, non-tender, non-distended. Bowel sounds present. No organomegaly or mass.  EXTREMITIES: No pedal edema, cyanosis, or clubbing.  NEUROLOGIC: Cranial nerves II through XII are intact. No focal motor or sensory defecits b/l.  PSYCHIATRIC: The patient is alert and oriented x 3. Good affect.  SKIN: No obvious rash, lesion, or ulcer.   DATA REVIEW:   CBC Recent Labs  Lab 07/29/18 0307  WBC 5.0  HGB 9.6*  HCT 30.3*  PLT 129*    Chemistries  Recent Labs  Lab 07/27/18 1135  07/29/18 0307  NA 135   < > 139  K 6.2*   < > 4.4  CL 93*   < > 100  CO2 23   < >  30  GLUCOSE 174*   < > 222*  BUN 109*   < > 37*  CREATININE 10.81*   < > 5.44*  CALCIUM 9.0   < > 8.4*  AST 24  --   --   ALT 26  --   --   ALKPHOS 113  --   --   BILITOT 0.9  --   --    < > = values in this interval not displayed.    Cardiac Enzymes Recent Labs  Lab 07/27/18 1135  TROPONINI 0.06*    Microbiology Results  Results for orders placed or performed during the hospital encounter of 07/27/18  MRSA PCR Screening     Status: None   Collection Time: 07/27/18 11:24 PM  Result Value Ref Range Status   MRSA by PCR NEGATIVE NEGATIVE Final    Comment:        The GeneXpert MRSA Assay (FDA approved for NASAL specimens only), is one component of a comprehensive MRSA colonization surveillance program. It is not intended to diagnose MRSA infection nor to guide or monitor treatment for MRSA  infections. Performed at [facility], [address] [place], [place] [address]   Gastrointestinal Panel by PCR , Stool     Status: None   Collection Time: 07/28/18  1:05 AM  Result Value Ref Range Status   Campylobacter species NOT DETECTED NOT DETECTED Final   Plesimonas shigelloides NOT DETECTED NOT DETECTED Final   Salmonella species NOT DETECTED NOT DETECTED Final   Yersinia enterocolitica NOT DETECTED NOT DETECTED Final   Vibrio species NOT DETECTED NOT DETECTED Final   Vibrio cholerae NOT DETECTED NOT DETECTED Final   Enteroaggregative E coli (EAEC) NOT DETECTED NOT DETECTED Final   Enteropathogenic E coli (EPEC) NOT DETECTED NOT DETECTED Final   Enterotoxigenic E coli (ETEC) NOT DETECTED NOT DETECTED Final   Shiga like toxin producing E coli (STEC) NOT DETECTED NOT DETECTED Final   Shigella/Enteroinvasive E coli (EIEC) NOT DETECTED NOT DETECTED Final   Cryptosporidium NOT DETECTED NOT DETECTED Final   Cyclospora cayetanensis NOT DETECTED NOT DETECTED Final   Entamoeba histolytica NOT DETECTED NOT DETECTED Final   Giardia lamblia NOT DETECTED NOT DETECTED Final   Adenovirus F40/41 NOT DETECTED NOT DETECTED Final   Astrovirus NOT DETECTED NOT DETECTED Final   Norovirus GI/GII NOT DETECTED NOT DETECTED Final   Rotavirus A NOT DETECTED NOT DETECTED Final   Sapovirus (I, II, IV, and V) NOT DETECTED NOT DETECTED Final    Comment: Performed at [facility], [address] [place], [place] [address]  C difficile quick scan w PCR reflex     Status: None   Collection Time: 07/28/18  1:05 AM  Result Value Ref Range Status   C Diff antigen NEGATIVE NEGATIVE Final   C Diff toxin NEGATIVE NEGATIVE Final   C Diff interpretation No C. difficile detected.  Final    Comment: Performed at [facility], [address] [place],  [address]    RADIOLOGY:  Ct Abdomen Pelvis Wo Contrast  Result Date: 07/27/2018 CLINICAL DATA:  Diarrhea since Friday.  EXAM: CT ABDOMEN AND PELVIS WITHOUT CONTRAST TECHNIQUE: Multidetector CT imaging of the abdomen and pelvis was performed following the standard protocol without IV contrast. COMPARISON:  10/01/2008 FINDINGS: Lower chest: Cardiomegaly with coronary arteriosclerosis. No pericardial effusion. No active pulmonary disease. Hepatobiliary: Granuloma in the left hepatic lobe. Allowing for limitations of a noncontrast study, no mass or biliary dilatation of the liver is  identified within the gallbladder is physiologically distended without stones. Pancreas: Normal Spleen: Normal Adrenals/Urinary Tract: Atrophic bilateral kidneys. No nephrolithiasis nor hydroureteronephrosis. No adrenal mass. The urinary bladder is decompressed in appearance and suboptimally assessed Stomach/Bowel: Physiologic distention of the stomach with ingested oral contrast. There is antegrade flow of contrast through the small intestine into large bowel. Soft tissue density centered within the cecum outlined by oral contrast likely reflects still less likely mass. This can be further correlated with a nonemergent barium enema, repeat CT or direct visual correlation. No mural thickening of the colon allowing for underdistention. The appendix is not well visualized. Vascular/Lymphatic: Moderate aortoiliac atherosclerosis without aneurysm. No lymphadenopathy by CT size criteria. Reproductive: Top-normal size prostate with central zone calcifications. Other: Mild-to-moderate amount of free intraperitoneal fluid is seen within the abdomen pelvis. Given atrophic appearance of kidneys, findings may be secondary to patient's history of peritoneal dialysis. No definite peritoneal studding or thickening of the omentum is noted to suggest peritoneal disease. Mild diffuse soft tissue anasarca is noted. Musculoskeletal: No acute or significant osseous findings. IMPRESSION: 1. Cardiomegaly with coronary arteriosclerosis. 2. Mild-to-moderate intraperitoneal free  fluid with atrophic appearance of the kidneys. Findings may reflect stigmata of peritoneal dialysis. The liver does not appear cirrhotic though potentially findings could represent ascites. No omental thickening or peritoneal studding to suggest peritoneal disease. 3. Soft tissue density centered within the cecum likely represents a stool ball. Repeat imaging, barium enema or direct visual correlation to exclude intraluminal mass though this is felt less likely. 4. No bowel obstruction or definite inflammatory change. Under distended colon likely contributes to the somewhat thickened appearance along the descending and sigmoid colon. Colitis is believed less likely though given history of diarrhea, not entirely excluded. Electronically Signed   By: Ashley Royalty M.D.   On: 07/27/2018 16:31      Management plans discussed with the patient, family and they are in agreement.  CODE STATUS:     Code Status Orders  (From admission, onward)         Start     Ordered   07/27/18 2151  Full code  Continuous     07/27/18 2150        TOTAL TIME TAKING CARE OF THIS PATIENT: 40 minutes.    Henreitta Leber M.D on 07/29/2018 at 3:46 PM  Between 7am to 6pm - Pager - [phone]  After 6pm go to [email or web address] - Proofreader  [facility]  Office  [phone]  CC: Primary care physician; Patient, No Pcp Per

## 2018-08-17 ENCOUNTER — Ambulatory Visit
Admit: 2018-08-17 | Discharge: 2018-08-17 | Attending: Student in an Organized Health Care Education/Training Program | Primary: Student in an Organized Health Care Education/Training Program

## 2018-08-17 ENCOUNTER — Ambulatory Visit: Admit: 2018-08-17 | Discharge: 2018-08-17

## 2018-08-17 DIAGNOSIS — E1165 Type 2 diabetes mellitus with hyperglycemia: Secondary | ICD-10-CM

## 2018-08-17 DIAGNOSIS — G629 Polyneuropathy, unspecified: Principal | ICD-10-CM

## 2018-08-17 DIAGNOSIS — N185 Chronic kidney disease, stage 5: Principal | ICD-10-CM

## 2018-08-17 DIAGNOSIS — E1129 Type 2 diabetes mellitus with other diabetic kidney complication: Secondary | ICD-10-CM

## 2018-08-17 DIAGNOSIS — M62838 Other muscle spasm: Secondary | ICD-10-CM

## 2018-08-17 DIAGNOSIS — I10 Essential (primary) hypertension: Secondary | ICD-10-CM

## 2018-08-17 MED ORDER — CITALOPRAM 20 MG TABLET
ORAL_TABLET | 3 refills | 0 days | Status: CP
Start: 2018-08-17 — End: 2019-08-17
  Filled 2018-09-30: qty 90, 90d supply, fill #0

## 2018-08-17 MED ORDER — AMLODIPINE 5 MG TABLET
ORAL_TABLET | Freq: Every day | ORAL | 3 refills | 0.00000 days | Status: CP
Start: 2018-08-17 — End: 2018-09-03

## 2018-08-17 MED ORDER — GABAPENTIN 300 MG CAPSULE
ORAL_CAPSULE | Freq: Every day | ORAL | 3 refills | 0 days | Status: CP
Start: 2018-08-17 — End: 2018-11-11

## 2018-08-28 ENCOUNTER — Ambulatory Visit: Admit: 2018-08-28 | Discharge: 2018-08-29

## 2018-09-03 MED ORDER — AMLODIPINE 10 MG TABLET
ORAL_TABLET | Freq: Every day | ORAL | 3 refills | 0.00000 days | Status: CP
Start: 2018-09-03 — End: 2019-05-31
  Filled 2018-09-30: qty 90, 90d supply, fill #0

## 2018-09-24 ENCOUNTER — Ambulatory Visit: Admit: 2018-09-24 | Discharge: 2018-09-24

## 2018-09-28 ENCOUNTER — Ambulatory Visit: Admit: 2018-09-28 | Discharge: 2018-09-29

## 2018-09-30 MED FILL — AMLODIPINE 10 MG TABLET: 90 days supply | Qty: 90 | Fill #0 | Status: AC

## 2018-09-30 MED FILL — SEVELAMER CARBONATE 800 MG TABLET: 30 days supply | Qty: 360 | Fill #1

## 2018-09-30 MED FILL — CITALOPRAM 20 MG TABLET: 90 days supply | Qty: 90 | Fill #0 | Status: AC

## 2018-09-30 MED FILL — LISINOPRIL 20 MG TABLET: 90 days supply | Qty: 90 | Fill #1 | Status: AC

## 2018-09-30 MED FILL — CALCIUM ACETATE(PHOSPHATE BINDERS) 667 MG CAPSULE: 30 days supply | Qty: 540 | Fill #1 | Status: AC

## 2018-09-30 MED FILL — SEVELAMER CARBONATE 800 MG TABLET: 30 days supply | Qty: 360 | Fill #1 | Status: AC

## 2018-09-30 MED FILL — LISINOPRIL 20 MG TABLET: ORAL | 90 days supply | Qty: 90 | Fill #1

## 2018-09-30 MED FILL — CALCIUM ACETATE(PHOSPHATE BINDERS) 667 MG CAPSULE: ORAL | 30 days supply | Qty: 540 | Fill #1

## 2018-10-06 MED ORDER — LISINOPRIL 40 MG TABLET
ORAL_TABLET | Freq: Every day | ORAL | 3 refills | 0.00000 days | Status: CP
Start: 2018-10-06 — End: 2019-05-31

## 2018-10-29 ENCOUNTER — Ambulatory Visit: Admit: 2018-10-29 | Discharge: 2018-10-30

## 2018-11-05 MED ORDER — AMLODIPINE 5 MG TABLET
ORAL_TABLET | Freq: Every evening | ORAL | 3 refills | 0.00000 days
Start: 2018-11-05 — End: ?

## 2018-11-05 MED ORDER — LISINOPRIL 20 MG TABLET
ORAL_TABLET | Freq: Every day | ORAL | 3 refills | 0.00000 days
Start: 2018-11-05 — End: 2019-05-31

## 2018-11-05 MED ORDER — SEVELAMER CARBONATE 800 MG TABLET
ORAL_TABLET | 11 refills | 0 days
Start: 2018-11-05 — End: 2019-05-31

## 2018-11-05 MED ORDER — CARVEDILOL 6.25 MG TABLET
ORAL_TABLET | Freq: Two times a day (BID) | ORAL | 3 refills | 0.00000 days
Start: 2018-11-05 — End: 2019-05-31

## 2018-11-08 MED FILL — SEVELAMER CARBONATE 800 MG TABLET: 30 days supply | Qty: 390 | Fill #0 | Status: AC

## 2018-11-08 MED FILL — SEVELAMER CARBONATE 800 MG TABLET: 30 days supply | Qty: 390 | Fill #0

## 2018-11-08 MED FILL — CARVEDILOL 6.25 MG TABLET: 90 days supply | Qty: 180 | Fill #0 | Status: AC

## 2018-11-08 MED FILL — AMLODIPINE 5 MG TABLET: 90 days supply | Qty: 90 | Fill #0 | Status: AC

## 2018-11-08 MED FILL — AMLODIPINE 5 MG TABLET: ORAL | 90 days supply | Qty: 90 | Fill #0

## 2018-11-08 MED FILL — CARVEDILOL 6.25 MG TABLET: ORAL | 90 days supply | Qty: 180 | Fill #0

## 2018-11-11 MED ORDER — GABAPENTIN 300 MG CAPSULE
ORAL_CAPSULE | Freq: Every day | ORAL | 1 refills | 0 days | Status: CP
Start: 2018-11-11 — End: 2019-05-31
  Filled 2018-11-18: qty 90, 90d supply, fill #0

## 2018-11-17 MED ORDER — INSULIN ASPAR PROT-INSULIN ASPART 100 UNIT/ML (70-30) SUBCUTANEOUS PEN
0 refills | 0 days | Status: CP
Start: 2018-11-17 — End: 2018-11-18

## 2018-11-18 MED ORDER — INSULIN ASPAR PROT-INSULIN ASPART 100 UNIT/ML (70-30) SUBCUTANEOUS PEN
1 refills | 0 days | Status: CP
Start: 2018-11-18 — End: 2019-11-18
  Filled 2018-11-18: qty 15, 100d supply, fill #0

## 2018-11-18 MED FILL — NOVOLOG MIX 70-30 FLEXPEN U-100 INSULIN 100 UNIT/ML SUBCUTANEOUS PEN: 100 days supply | Qty: 15 | Fill #0 | Status: AC

## 2018-11-18 MED FILL — GABAPENTIN 300 MG CAPSULE: 90 days supply | Qty: 90 | Fill #0 | Status: AC

## 2018-11-27 ENCOUNTER — Ambulatory Visit: Admit: 2018-11-27 | Discharge: 2018-11-28

## 2018-12-06 ENCOUNTER — Other Ambulatory Visit: Payer: Self-pay

## 2018-12-06 ENCOUNTER — Emergency Department: Payer: Self-pay

## 2018-12-06 ENCOUNTER — Encounter: Payer: Self-pay | Admitting: Emergency Medicine

## 2018-12-06 ENCOUNTER — Emergency Department
Admission: EM | Admit: 2018-12-06 | Discharge: 2018-12-06 | Disposition: A | Discharge status: Home / Self Care | Payer: Self-pay | Attending: Emergency Medicine | Admitting: Emergency Medicine

## 2018-12-06 DIAGNOSIS — Z794 Long term (current) use of insulin: Secondary | ICD-10-CM | POA: Insufficient documentation

## 2018-12-06 DIAGNOSIS — Y939 Activity, unspecified: Secondary | ICD-10-CM | POA: Insufficient documentation

## 2018-12-06 DIAGNOSIS — R262 Difficulty in walking, not elsewhere classified: Secondary | ICD-10-CM

## 2018-12-06 DIAGNOSIS — E119 Type 2 diabetes mellitus without complications: Secondary | ICD-10-CM | POA: Insufficient documentation

## 2018-12-06 DIAGNOSIS — W010XXA Fall on same level from slipping, tripping and stumbling without subsequent striking against object, initial encounter: Secondary | ICD-10-CM | POA: Insufficient documentation

## 2018-12-06 DIAGNOSIS — W19XXXA Unspecified fall, initial encounter: Secondary | ICD-10-CM

## 2018-12-06 DIAGNOSIS — I10 Essential (primary) hypertension: Secondary | ICD-10-CM | POA: Insufficient documentation

## 2018-12-06 DIAGNOSIS — Y999 Unspecified external cause status: Secondary | ICD-10-CM | POA: Insufficient documentation

## 2018-12-06 DIAGNOSIS — R2689 Other abnormalities of gait and mobility: Secondary | ICD-10-CM | POA: Insufficient documentation

## 2018-12-06 DIAGNOSIS — Z9181 History of falling: Secondary | ICD-10-CM | POA: Insufficient documentation

## 2018-12-06 DIAGNOSIS — S20211A Contusion of right front wall of thorax, initial encounter: Secondary | ICD-10-CM | POA: Insufficient documentation

## 2018-12-06 DIAGNOSIS — Y92002 Bathroom of unspecified non-institutional (private) residence single-family (private) house as the place of occurrence of the external cause: Secondary | ICD-10-CM | POA: Insufficient documentation

## 2018-12-06 DIAGNOSIS — Z79899 Other long term (current) drug therapy: Secondary | ICD-10-CM | POA: Insufficient documentation

## 2018-12-06 LAB — BASIC METABOLIC PANEL
Anion gap: 12 (ref 5–15)
BUN: 35 mg/dL — AB (ref 8–23)
CHLORIDE: 94 mmol/L — AB (ref 98–111)
CO2: 32 mmol/L (ref 22–32)
CREATININE: 4.66 mg/dL — AB (ref 0.61–1.24)
Calcium: 8.7 mg/dL — ABNORMAL LOW (ref 8.9–10.3)
GFR calc Af Amer: 14 mL/min — ABNORMAL LOW (ref >60)
GFR calc non Af Amer: 12 mL/min — ABNORMAL LOW (ref >60)
Glucose, Bld: 245 mg/dL — ABNORMAL HIGH (ref 70–99)
POTASSIUM: 4.1 mmol/L (ref 3.5–5.1)
Sodium: 138 mmol/L (ref 135–145)

## 2018-12-06 LAB — CBC WITH DIFFERENTIAL/PLATELET
Abs Immature Granulocytes: 0.01 10*3/uL (ref 0.00–0.07)
Basophils Absolute: 0 10*3/uL (ref 0.0–0.1)
Basophils Relative: 0 %
EOS PCT: 4 %
Eosinophils Absolute: 0.1 10*3/uL (ref 0.0–0.5)
HEMATOCRIT: 30.7 % — AB (ref 39.0–52.0)
HEMOGLOBIN: 9.8 g/dL — AB (ref 13.0–17.0)
Immature Granulocytes: 0 %
LYMPHS ABS: 0.4 10*3/uL — AB (ref 0.7–4.0)
LYMPHS PCT: 13 %
MCH: 31.1 pg (ref 26.0–34.0)
MCHC: 31.9 g/dL (ref 30.0–36.0)
MCV: 97.5 fL (ref 80.0–100.0)
MONOS PCT: 12 %
Monocytes Absolute: 0.4 10*3/uL (ref 0.1–1.0)
Neutro Abs: 2.3 10*3/uL (ref 1.7–7.7)
Neutrophils Relative %: 71 %
Platelets: 103 10*3/uL — ABNORMAL LOW (ref 150–400)
RBC: 3.15 MIL/uL — ABNORMAL LOW (ref 4.22–5.81)
RDW: 14.3 % (ref 11.5–15.5)
WBC: 3.2 10*3/uL — AB (ref 4.0–10.5)
nRBC: 0 % (ref 0.0–0.2)

## 2018-12-06 MED ORDER — FENTANYL CITRATE (PF) 100 MCG/2ML IJ SOLN
50.0000 ug | Freq: Once | INTRAMUSCULAR | Status: AC
  Administered 2018-12-06: 50 ug via INTRAVENOUS
  Filled 2018-12-06: qty 2

## 2018-12-06 MED ORDER — FENTANYL CITRATE (PF) 100 MCG/2ML IJ SOLN
INTRAMUSCULAR | Status: AC
  Filled 2018-12-06: qty 2

## 2018-12-06 MED ORDER — TRAMADOL HCL 50 MG PO TABS: 50.0000 mg | ORAL_TABLET | Freq: Four times a day (QID) | ORAL | 0 refills | Status: DC | PRN

## 2018-12-06 MED ORDER — FENTANYL CITRATE (PF) 100 MCG/2ML IJ SOLN
75.0000 ug | Freq: Once | INTRAMUSCULAR | Status: AC
  Administered 2018-12-06: 75 ug via INTRAVENOUS

## 2018-12-06 NOTE — ED Notes (Signed)
Interpreter request has been placed.

## 2018-12-06 NOTE — ED Triage Notes (Signed)
Pt to ED via POV c/o fall last night in the bathtub. Pt thinks that he may have broken rib. Pt wife states that pt has been having problems with balance for a while but it has gotten worse over the last 8 days. Pt family states that he is also having a lot swelling in his legs and feet. Pt is a dialysis pt, MWF schedule, Pt had full treatment today. Pt is in NAD at this time.

## 2018-12-06 NOTE — ED Notes (Addendum)
Patient transported to MRI 

## 2018-12-06 NOTE — Discharge Instructions (Addendum)
Please seek medical attention for any high fevers, chest pain, shortness of breath, change in behavior, persistent vomiting, bloody stool or any other new or concerning symptoms.  

## 2018-12-06 NOTE — ED Provider Notes (Signed)
[facility] Emergency Department Provider Note   ____________________________________________   I have reviewed the triage vital signs and the nursing notes.   HISTORY  Chief Complaint Fall   History limited by: [place] utilized   HPI Danny [name] is a 63 y.o. male who presents to the emergency department today because of concern for falls and right back pain. The patient states that he has been having increased falls over the past 2 weeks. Wife describes him as walking as if he is drunk. The patient had a fall last night in the bathroom. [name] and his his right side. States that since then his pain has been severe. It is worse with movement, lying flat or attempting deep breaths. The patient denies any recent fevers.   Per medical record review patient has a history of DM.   Past Medical History:  Diagnosis Date  . Diabetes mellitus without complication ([place])   . Dialysis patient [facility])    tuesday, thursday and saturday  . Gout   . Hypertension   . Kidney disease     Patient Active Problem List   Diagnosis Date Noted  . Acute diarrhea 07/27/2018  . Viral gastroenteritis 10/04/2016  . Anemia 10/04/2016  . Thrombocytopenia ([place]) 10/04/2016  . Rectal bleeding 10/04/2016  . Hyperkalemia 10/02/2016  . Pulmonary edema 05/13/2016    Past Surgical History:  Procedure Laterality Date  . AV FISTULA PLACEMENT    . miscellaneous     peritoneal dialysis catheter placement and removal    Prior to Admission medications   Medication Sig Start Date End Date Taking? Authorizing Provider  amLODipine (NORVASC) 5 MG tablet Take 5 mg by mouth daily.    [provider]  calcium acetate (PHOSLO) 667 MG capsule Take 1,334-2,668 mg by mouth See admin instructions. Take 4 capsules (2668MG ) by mouth 3 times daily with meals and 2 capsules (1334MG ) by mouth 2 times daily with snacks    [provider]  citalopram  (CELEXA) 20 MG tablet Take 20 mg by mouth daily.     [provider]  insulin aspart protamine- aspart (NOVOLOG MIX 70/30) (70-30) 100 UNIT/ML injection Inject 10-12 Units into the skin See admin instructions. Inject 12u under the skin every morning and 10u under the skin at night    [provider]  liraglutide (VICTOZA) 18 MG/3ML SOPN Inject 0.6 mg into the skin daily.    [provider]  lisinopril (PRINIVIL,ZESTRIL) 20 MG tablet Take 20 mg by mouth daily.    [provider]  loperamide (IMODIUM) 2 MG capsule Take 1 capsule (2 mg total) by mouth every 6 (six) hours as needed for diarrhea or loose stools. 07/29/18   Henreitta Leber, MD  omeprazole (PRILOSEC OTC) 20 MG tablet Take 20 mg by mouth daily.    [provider]  sevelamer carbonate (RENVELA) 800 MG tablet Take 3,200 mg by mouth 3 (three) times daily with meals.    [provider]    Allergies Patient has no known allergies.  Family History  Problem Relation Age of Onset  . Diabetes Mother   . Prostate cancer Father   . Kidney disease Sister     Social History Social History   Tobacco Use  . Smoking status: Never Smoker  . Smokeless tobacco: Never Used  Substance Use Topics  . Alcohol use: No  . Drug use: No    Review of Systems Constitutional: No fever/chills Eyes: No  visual changes. ENT: No sore throat. Cardiovascular: Denies chest pain. Respiratory: Denies shortness of breath. Gastrointestinal: No abdominal pain.  No nausea, no vomiting.  No diarrhea.   Genitourinary: Negative for dysuria. Musculoskeletal: Positive for right back pain.  Skin: Negative for rash. Neurological: Negative for headaches, focal weakness or numbness.  ____________________________________________   PHYSICAL EXAM:  VITAL SIGNS: ED Triage Vitals  Enc Vitals Group     BP 12/06/18 1314 (!) 164/64     Pulse Rate 12/06/18 1314 71     Resp 12/06/18 1314 16     Temp 12/06/18 1314  99.4 F (37.4 C)     Temp Source 12/06/18 1314 Oral     SpO2 12/06/18 1314 100 %     Weight 12/06/18 1312 207 lb 3.7 oz (94 kg)     Height 12/06/18 1312 5\' 8"  (1.727 m)     Head Circumference --      Peak Flow --      Pain Score 12/06/18 1310 10   Constitutional: Alert and oriented.  Eyes: Conjunctivae are normal.  ENT      Head: Normocephalic and atraumatic.      Nose: No congestion/rhinnorhea.      Mouth/Throat: Mucous membranes are moist.      Neck: No stridor. Hematological/Lymphatic/Immunilogical: No cervical lymphadenopathy. Cardiovascular: Normal rate, regular rhythm.  No murmurs, rubs, or gallops.  Respiratory: Normal respiratory effort without tachypnea nor retractions. Breath sounds are clear and equal bilaterally. No wheezes/rales/rhonchi. Gastrointestinal: Soft and non tender. No rebound. No guarding.  Genitourinary: Deferred Musculoskeletal: Normal range of motion in all extremities. No lower extremity edema. Neurologic:  Normal speech and language. No gross focal neurologic deficits are appreciated.  Skin:  Bruising noted to his right back.  Psychiatric: Mood and affect are normal. Speech and behavior are normal. Patient exhibits appropriate insight and judgment.  ____________________________________________    LABS (pertinent positives/negatives)  CBC wbc 3.2, hgb 9.8, plt 103 BMP na 138, k 4.1, glu 245, cr 4.66  ____________________________________________   EKG  None  ____________________________________________    RADIOLOGY  CXR No acute abnormalities  CT head No acute abnormality  MR brain No acute abnormality ____________________________________________   PROCEDURES  Procedures  ____________________________________________   INITIAL IMPRESSION / ASSESSMENT AND PLAN / ED COURSE  Pertinent labs & imaging results that were available during my care of the patient were reviewed by me and considered in my medical decision making (see  chart for details).   Patient presented to the emergency department today because of concerns for pain to his right lower ribs and difficulty with ambulation.  In terms of the pain from the ribs he did have some bruising over his right lower ribs.  Chest x-ray without any acute osseous injury.  No pneumothorax.  At this point I think rib contusion likely.  I doubt liver injury at this time given stable vital signs.  In terms of his difficulty with ambulation I did obtain an MRI of the brain to evaluate for any acute stroke.  No acute stroke seen on MRI.  Patient not with having any lumbar spine pain, incontinence of bladder or bowel or urinary retention.  At this point I think spinal cord lesion unlikely.  Did discuss with patient importance of following up with neurology for further testing.  Will give patient neurology follow-up.  Did discuss with patient return precautions of bowel or bladder changes.  ____________________________________________   FINAL CLINICAL IMPRESSION(S) / ED DIAGNOSES  Final diagnoses:  Fall,  initial encounter  Impaired ambulation  Contusion of rib on right side, initial encounter     Note: This dictation was prepared with Dragon dictation. Any transcriptional errors that result from this process are unintentional     Nance Pear, MD 12/06/18 [phone]

## 2018-12-06 NOTE — ED Notes (Signed)
[address] [phone] with EDP at bedside

## 2018-12-24 MED ORDER — PEN NEEDLE, DIABETIC 31 GAUGE X 5/16" (8 MM)
4 refills | 0 days
Start: 2018-12-24 — End: 2019-03-01

## 2018-12-28 ENCOUNTER — Ambulatory Visit: Admit: 2018-12-28 | Discharge: 2018-12-29

## 2019-01-27 ENCOUNTER — Ambulatory Visit: Admit: 2019-01-27 | Discharge: 2019-01-28

## 2019-02-25 MED ORDER — GABAPENTIN 300 MG CAPSULE
ORAL_CAPSULE | 11 refills | 0 days
Start: 2019-02-25 — End: 2019-05-31

## 2019-02-27 ENCOUNTER — Ambulatory Visit: Admit: 2019-02-27 | Discharge: 2019-02-28

## 2019-03-01 MED ORDER — PEN NEEDLE, DIABETIC 31 GAUGE X 5/16" (8 MM)
4 refills | 0 days | Status: CP
Start: 2019-03-01 — End: 2019-03-02

## 2019-03-02 MED ORDER — PEN NEEDLE, DIABETIC 31 GAUGE X 5/16" (8 MM)
ORAL | 4 refills | 0.00000 days | Status: CP
Start: 2019-03-02 — End: 2019-03-02

## 2019-03-02 MED ORDER — PEN NEEDLE, DIABETIC 31 GAUGE X 5/16" (8 MM): each | 4 refills | 0 days | Status: AC

## 2019-03-03 MED ORDER — CALCIUM ACETATE(PHOSPHATE BINDERS) 667 MG CAPSULE
ORAL_CAPSULE | Freq: Three times a day (TID) | ORAL | 11 refills | 0.00000 days | Status: CP
Start: 2019-03-03 — End: 2020-03-02
  Filled 2019-03-03: qty 180, 30d supply, fill #0

## 2019-03-03 MED FILL — ULTICARE PEN NEEDLE 31 GAUGE X 5/16": 100 days supply | Qty: 200 | Fill #0 | Status: AC

## 2019-03-03 MED FILL — CARVEDILOL 6.25 MG TABLET: ORAL | 90 days supply | Qty: 180 | Fill #1

## 2019-03-03 MED FILL — SEVELAMER CARBONATE 800 MG TABLET: 30 days supply | Qty: 390 | Fill #1 | Status: AC

## 2019-03-03 MED FILL — ULTICARE PEN NEEDLE 31 GAUGE X 5/16" (8 MM): 100 days supply | Qty: 200 | Fill #0

## 2019-03-03 MED FILL — CITALOPRAM 20 MG TABLET: 90 days supply | Qty: 90 | Fill #1

## 2019-03-03 MED FILL — AMLODIPINE 10 MG TABLET: 90 days supply | Qty: 90 | Fill #1 | Status: AC

## 2019-03-03 MED FILL — CALCIUM ACETATE(PHOSPHATE BINDERS) 667 MG CAPSULE: 30 days supply | Qty: 180 | Fill #0 | Status: AC

## 2019-03-03 MED FILL — CARVEDILOL 6.25 MG TABLET: 90 days supply | Qty: 180 | Fill #1 | Status: AC

## 2019-03-03 MED FILL — NOVOLOG MIX 70-30 FLEXPEN U-100 INSULIN 100 UNIT/ML SUBCUTANEOUS PEN: 100 days supply | Qty: 15 | Fill #1 | Status: AC

## 2019-03-03 MED FILL — NOVOLOG MIX 70-30 FLEXPEN U-100 INSULIN 100 UNIT/ML SUBCUTANEOUS PEN: 100 days supply | Qty: 15 | Fill #1

## 2019-03-03 MED FILL — LISINOPRIL 20 MG TABLET: ORAL | 90 days supply | Qty: 90 | Fill #0

## 2019-03-03 MED FILL — AMLODIPINE 10 MG TABLET: ORAL | 90 days supply | Qty: 90 | Fill #1

## 2019-03-03 MED FILL — AMLODIPINE 5 MG TABLET: 90 days supply | Qty: 90 | Fill #1 | Status: AC

## 2019-03-03 MED FILL — GABAPENTIN 300 MG CAPSULE: 30 days supply | Qty: 30 | Fill #0 | Status: AC

## 2019-03-03 MED FILL — AMLODIPINE 5 MG TABLET: ORAL | 90 days supply | Qty: 90 | Fill #1

## 2019-03-03 MED FILL — LISINOPRIL 20 MG TABLET: 90 days supply | Qty: 90 | Fill #0 | Status: AC

## 2019-03-03 MED FILL — SEVELAMER CARBONATE 800 MG TABLET: 30 days supply | Qty: 390 | Fill #1

## 2019-03-03 MED FILL — CITALOPRAM 20 MG TABLET: 90 days supply | Qty: 90 | Fill #1 | Status: AC

## 2019-03-03 MED FILL — GABAPENTIN 300 MG CAPSULE: 30 days supply | Qty: 30 | Fill #0

## 2019-03-11 IMAGING — CR DG CHEST 2V
1 series · 2 of 2 positions shown · non-contrast
Comparison: 02/03/2018.

CLINICAL DATA: Cough for the past 3 days.

EXAM:
CHEST - 2 VIEW

[Series 1: dg chest 2 view · 0.14mm/px · 2 of 2 slices shown]
[im 1/2]
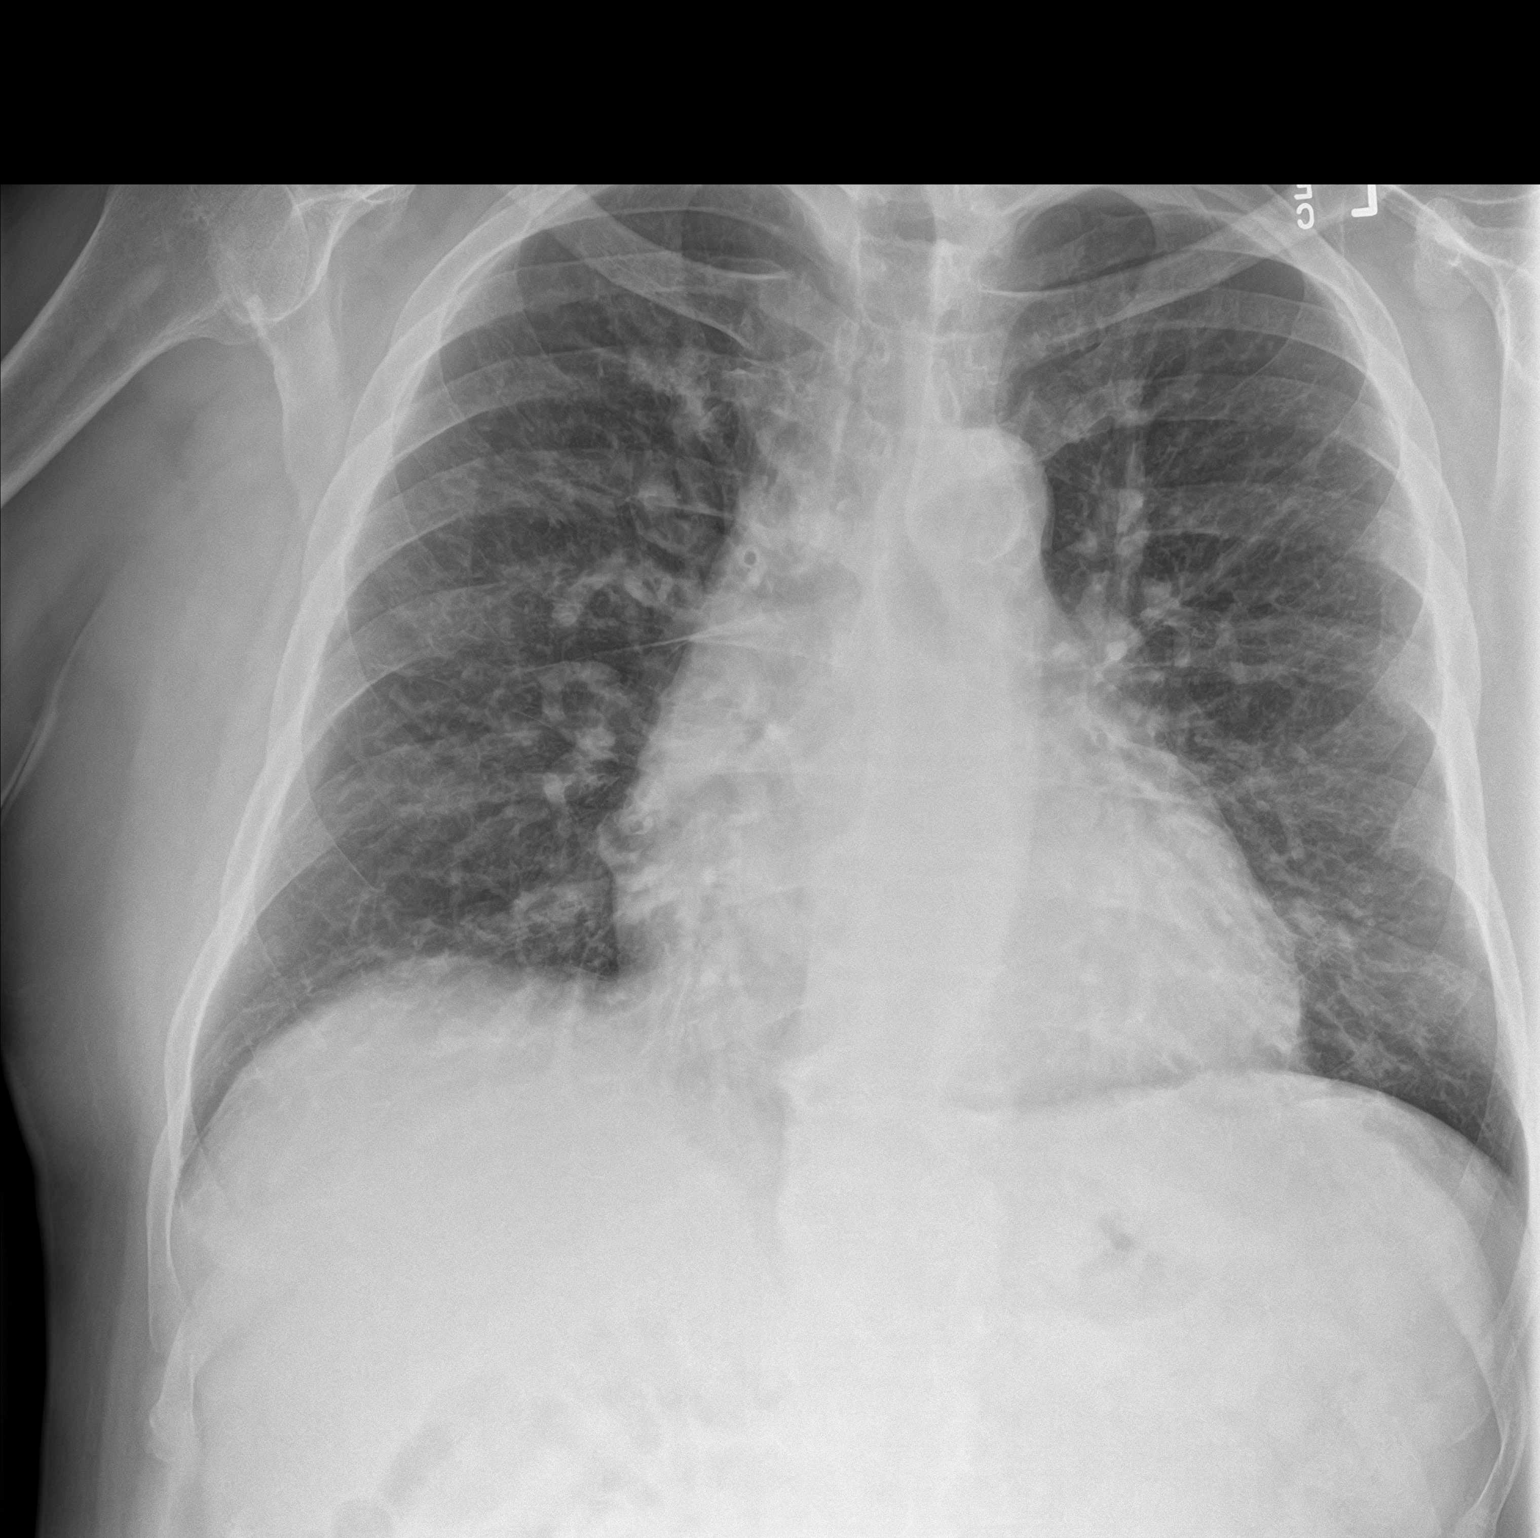
[im 2/2]
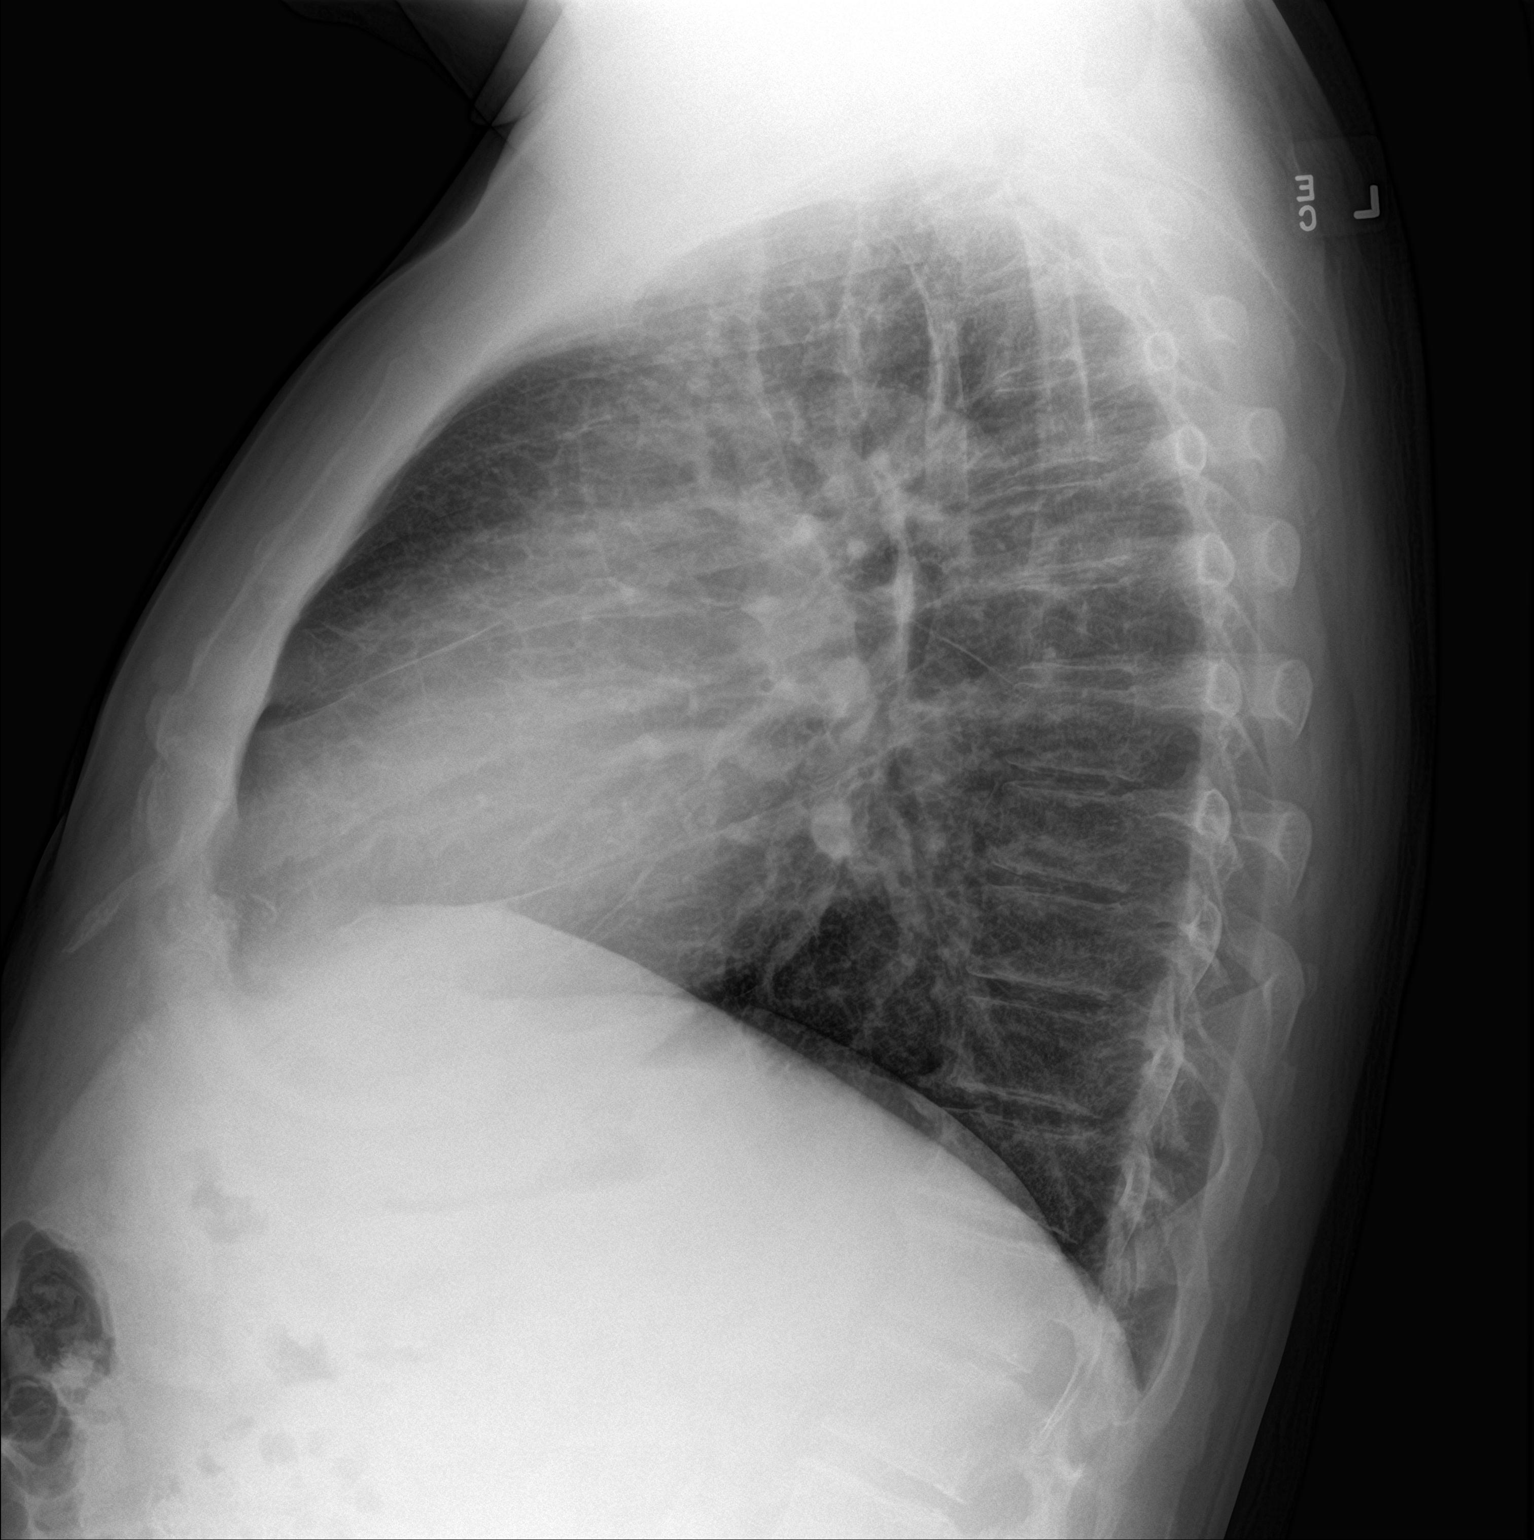

[2 of 2 positions shown; findings below may reference images not displayed]

FINDINGS: Stable enlarged cardiac silhouette. Decreased prominence of the
pulmonary vasculature and interstitial markings. No pleural fluid or
airspace consolidation. Stable central peribronchial thickening.
Lower thoracic spine degenerative changes.
IMPRESSION: 1. Stable moderate bronchitic changes.
2. Resolved changes of congestive heart failure.
3. Stable cardiomegaly.

## 2019-03-25 MED ORDER — GABAPENTIN 300 MG CAPSULE
ORAL_CAPSULE | Freq: Every day | ORAL | 11 refills | 0.00000 days
Start: 2019-03-25 — End: 2019-05-31

## 2019-03-29 ENCOUNTER — Ambulatory Visit: Admit: 2019-03-29 | Discharge: 2019-03-30

## 2019-04-08 MED ORDER — GABAPENTIN 300 MG CAPSULE
ORAL_CAPSULE | Freq: Every day | ORAL | 11 refills | 30.00000 days
Start: 2019-04-08 — End: ?

## 2019-04-29 ENCOUNTER — Ambulatory Visit: Admit: 2019-04-29 | Discharge: 2019-04-30

## 2019-05-28 ENCOUNTER — Encounter: Payer: Self-pay | Admitting: Emergency Medicine

## 2019-05-28 ENCOUNTER — Emergency Department: Payer: Medicaid Other

## 2019-05-28 ENCOUNTER — Inpatient Hospital Stay
Admission: EM | Admit: 2019-05-28 | Discharge: 2019-05-29 | DRG: 291 | Disposition: A | Discharge status: Home / Self Care | Payer: Medicaid Other | Attending: Internal Medicine | Admitting: Internal Medicine

## 2019-05-28 ENCOUNTER — Other Ambulatory Visit: Payer: Self-pay

## 2019-05-28 DIAGNOSIS — J9601 Acute respiratory failure with hypoxia: Secondary | ICD-10-CM | POA: Diagnosis present

## 2019-05-28 DIAGNOSIS — Z992 Dependence on renal dialysis: Secondary | ICD-10-CM

## 2019-05-28 DIAGNOSIS — E1122 Type 2 diabetes mellitus with diabetic chronic kidney disease: Secondary | ICD-10-CM | POA: Diagnosis present

## 2019-05-28 DIAGNOSIS — Z833 Family history of diabetes mellitus: Secondary | ICD-10-CM

## 2019-05-28 DIAGNOSIS — M899 Disorder of bone, unspecified: Secondary | ICD-10-CM | POA: Diagnosis present

## 2019-05-28 DIAGNOSIS — Z841 Family history of disorders of kidney and ureter: Secondary | ICD-10-CM

## 2019-05-28 DIAGNOSIS — D631 Anemia in chronic kidney disease: Secondary | ICD-10-CM | POA: Diagnosis present

## 2019-05-28 DIAGNOSIS — R0602 Shortness of breath: Secondary | ICD-10-CM | POA: Diagnosis not present

## 2019-05-28 DIAGNOSIS — R0600 Dyspnea, unspecified: Secondary | ICD-10-CM

## 2019-05-28 DIAGNOSIS — Z20828 Contact with and (suspected) exposure to other viral communicable diseases: Secondary | ICD-10-CM | POA: Diagnosis present

## 2019-05-28 DIAGNOSIS — Z8042 Family history of malignant neoplasm of prostate: Secondary | ICD-10-CM

## 2019-05-28 DIAGNOSIS — Z794 Long term (current) use of insulin: Secondary | ICD-10-CM

## 2019-05-28 DIAGNOSIS — I509 Heart failure, unspecified: Secondary | ICD-10-CM

## 2019-05-28 DIAGNOSIS — R0609 Other forms of dyspnea: Secondary | ICD-10-CM

## 2019-05-28 DIAGNOSIS — I5043 Acute on chronic combined systolic (congestive) and diastolic (congestive) heart failure: Secondary | ICD-10-CM | POA: Diagnosis present

## 2019-05-28 DIAGNOSIS — K219 Gastro-esophageal reflux disease without esophagitis: Secondary | ICD-10-CM | POA: Diagnosis present

## 2019-05-28 DIAGNOSIS — I5033 Acute on chronic diastolic (congestive) heart failure: Secondary | ICD-10-CM

## 2019-05-28 DIAGNOSIS — Z79899 Other long term (current) drug therapy: Secondary | ICD-10-CM

## 2019-05-28 DIAGNOSIS — Z79891 Long term (current) use of opiate analgesic: Secondary | ICD-10-CM

## 2019-05-28 DIAGNOSIS — N186 End stage renal disease: Secondary | ICD-10-CM | POA: Diagnosis present

## 2019-05-28 DIAGNOSIS — I132 Hypertensive heart and chronic kidney disease with heart failure and with stage 5 chronic kidney disease, or end stage renal disease: Secondary | ICD-10-CM | POA: Diagnosis not present

## 2019-05-28 DIAGNOSIS — R0601 Orthopnea: Secondary | ICD-10-CM

## 2019-05-28 LAB — BASIC METABOLIC PANEL
Anion gap: 11 (ref 5–15)
BUN: 36 mg/dL — ABNORMAL HIGH (ref 8–23)
CO2: 30 mmol/L (ref 22–32)
Calcium: 9.5 mg/dL (ref 8.9–10.3)
Chloride: 96 mmol/L — ABNORMAL LOW (ref 98–111)
Creatinine, Ser: 5.49 mg/dL — ABNORMAL HIGH (ref 0.61–1.24)
GFR calc Af Amer: 12 mL/min — ABNORMAL LOW (ref >60)
GFR calc non Af Amer: 10 mL/min — ABNORMAL LOW (ref >60)
Glucose, Bld: 216 mg/dL — ABNORMAL HIGH (ref 70–99)
Potassium: 4.5 mmol/L (ref 3.5–5.1)
Sodium: 137 mmol/L (ref 135–145)

## 2019-05-28 LAB — CBC
HCT: 26.8 % — ABNORMAL LOW (ref 39.0–52.0)
Hemoglobin: 8.5 g/dL — ABNORMAL LOW (ref 13.0–17.0)
MCH: 32.4 pg (ref 26.0–34.0)
MCHC: 31.7 g/dL (ref 30.0–36.0)
MCV: 102.3 fL — ABNORMAL HIGH (ref 80.0–100.0)
Platelets: 143 10*3/uL — ABNORMAL LOW (ref 150–400)
RBC: 2.62 MIL/uL — ABNORMAL LOW (ref 4.22–5.81)
RDW: 13.6 % (ref 11.5–15.5)
WBC: 6.4 10*3/uL (ref 4.0–10.5)
nRBC: 0 % (ref 0.0–0.2)

## 2019-05-28 LAB — TROPONIN I (HIGH SENSITIVITY)
Troponin I (High Sensitivity): 47 ng/L — ABNORMAL HIGH (ref <18)
Troponin I (High Sensitivity): 44 ng/L — ABNORMAL HIGH (ref <18)

## 2019-05-28 LAB — GLUCOSE, CAPILLARY
Glucose-Capillary: 108 mg/dL — ABNORMAL HIGH (ref 70–99)
Glucose-Capillary: 283 mg/dL — ABNORMAL HIGH (ref 70–99)

## 2019-05-28 LAB — HEMOGLOBIN A1C
Hgb A1c MFr Bld: 6 % — ABNORMAL HIGH (ref 4.8–5.6)
Mean Plasma Glucose: 125.5 mg/dL

## 2019-05-28 LAB — BRAIN NATRIURETIC PEPTIDE: B Natriuretic Peptide: 4023 pg/mL — ABNORMAL HIGH (ref 0.0–100.0)

## 2019-05-28 LAB — SARS CORONAVIRUS 2 BY RT PCR (HOSPITAL ORDER, PERFORMED IN ~~LOC~~ HOSPITAL LAB): SARS Coronavirus 2: NEGATIVE

## 2019-05-28 MED ORDER — LISINOPRIL 20 MG PO TABS
20.0000 mg | ORAL_TABLET | Freq: Every day | ORAL | Status: DC
  Administered 2019-05-28: 21:00:00 20 mg via ORAL
  Filled 2019-05-28 (×2): qty 1

## 2019-05-28 MED ORDER — HEPARIN SODIUM (PORCINE) 1000 UNIT/ML DIALYSIS
20.0000 [IU]/kg | INTRAMUSCULAR | Status: DC | PRN
  Filled 2019-05-28: qty 2

## 2019-05-28 MED ORDER — CHLORHEXIDINE GLUCONATE CLOTH 2 % EX PADS: 6.0000 | MEDICATED_PAD | Freq: Every day | CUTANEOUS | Status: DC

## 2019-05-28 MED ORDER — LOPERAMIDE HCL 2 MG PO CAPS: 2.0000 mg | ORAL_CAPSULE | Freq: Four times a day (QID) | ORAL | Status: DC | PRN

## 2019-05-28 MED ORDER — SODIUM CHLORIDE 0.9% FLUSH: 3.0000 mL | INTRAVENOUS | Status: DC | PRN

## 2019-05-28 MED ORDER — CARVEDILOL 6.25 MG PO TABS
3.1250 mg | ORAL_TABLET | Freq: Two times a day (BID) | ORAL | Status: DC
  Administered 2019-05-28 – 2019-05-29 (×2): 3.125 mg via ORAL
  Filled 2019-05-28 (×2): qty 1

## 2019-05-28 MED ORDER — ONDANSETRON HCL 4 MG/2ML IJ SOLN: 4.0000 mg | Freq: Four times a day (QID) | INTRAMUSCULAR | Status: DC | PRN

## 2019-05-28 MED ORDER — HEPARIN SODIUM (PORCINE) 1000 UNIT/ML DIALYSIS
1000.0000 [IU] | INTRAMUSCULAR | Status: DC | PRN
  Filled 2019-05-28: qty 1

## 2019-05-28 MED ORDER — ACETAMINOPHEN 325 MG PO TABS: 650.0000 mg | ORAL_TABLET | ORAL | Status: DC | PRN

## 2019-05-28 MED ORDER — PANTOPRAZOLE SODIUM 40 MG PO TBEC
40.0000 mg | DELAYED_RELEASE_TABLET | Freq: Every day | ORAL | Status: DC
  Administered 2019-05-29: 09:00:00 40 mg via ORAL
  Filled 2019-05-28: qty 1

## 2019-05-28 MED ORDER — LIDOCAINE-PRILOCAINE 2.5-2.5 % EX CREA
1.0000 "application " | TOPICAL_CREAM | CUTANEOUS | Status: DC | PRN
  Filled 2019-05-28: qty 5

## 2019-05-28 MED ORDER — TRAMADOL HCL 50 MG PO TABS: 50.0000 mg | ORAL_TABLET | Freq: Four times a day (QID) | ORAL | Status: DC | PRN

## 2019-05-28 MED ORDER — LIRAGLUTIDE 18 MG/3ML ~~LOC~~ SOPN: 0.6000 mg | PEN_INJECTOR | Freq: Every day | SUBCUTANEOUS | Status: DC

## 2019-05-28 MED ORDER — DIPHENHYDRAMINE HCL 25 MG PO CAPS
25.0000 mg | ORAL_CAPSULE | Freq: Once | ORAL | Status: AC
  Administered 2019-05-28: 25 mg via ORAL
  Filled 2019-05-28: qty 1

## 2019-05-28 MED ORDER — INSULIN ASPART 100 UNIT/ML ~~LOC~~ SOLN
0.0000 [IU] | Freq: Every day | SUBCUTANEOUS | Status: DC
  Administered 2019-05-28: 3 [IU] via SUBCUTANEOUS
  Filled 2019-05-28: qty 1

## 2019-05-28 MED ORDER — SODIUM CHLORIDE 0.9% FLUSH
3.0000 mL | Freq: Two times a day (BID) | INTRAVENOUS | Status: DC
  Administered 2019-05-28 – 2019-05-29 (×2): 3 mL via INTRAVENOUS

## 2019-05-28 MED ORDER — SODIUM CHLORIDE 0.9 % IV SOLN: 100.0000 mL | INTRAVENOUS | Status: DC | PRN

## 2019-05-28 MED ORDER — HEPARIN SODIUM (PORCINE) 5000 UNIT/ML IJ SOLN
5000.0000 [IU] | Freq: Three times a day (TID) | INTRAMUSCULAR | Status: DC
  Administered 2019-05-28 – 2019-05-29 (×2): 5000 [IU] via SUBCUTANEOUS
  Filled 2019-05-28 (×2): qty 1

## 2019-05-28 MED ORDER — PENTAFLUOROPROP-TETRAFLUOROETH EX AERO
1.0000 "application " | INHALATION_SPRAY | CUTANEOUS | Status: DC | PRN
  Filled 2019-05-28: qty 30

## 2019-05-28 MED ORDER — INSULIN ASPART 100 UNIT/ML ~~LOC~~ SOLN: 0.0000 [IU] | Freq: Three times a day (TID) | SUBCUTANEOUS | Status: DC

## 2019-05-28 MED ORDER — SEVELAMER CARBONATE 800 MG PO TABS
3200.0000 mg | ORAL_TABLET | Freq: Three times a day (TID) | ORAL | Status: DC
  Administered 2019-05-29: 3200 mg via ORAL
  Filled 2019-05-28: qty 4

## 2019-05-28 MED ORDER — DIPHENHYDRAMINE HCL 50 MG/ML IJ SOLN
12.5000 mg | Freq: Once | INTRAMUSCULAR | Status: AC
  Administered 2019-05-28: 12.5 mg via INTRAVENOUS
  Filled 2019-05-28: qty 1

## 2019-05-28 MED ORDER — CALCIUM ACETATE (PHOS BINDER) 667 MG PO CAPS: 1334.0000 mg | ORAL_CAPSULE | ORAL | Status: DC

## 2019-05-28 MED ORDER — ALTEPLASE 2 MG IJ SOLR
2.0000 mg | Freq: Once | INTRAMUSCULAR | Status: DC | PRN
  Filled 2019-05-28: qty 2

## 2019-05-28 MED ORDER — CITALOPRAM HYDROBROMIDE 20 MG PO TABS
20.0000 mg | ORAL_TABLET | Freq: Every day | ORAL | Status: DC
  Administered 2019-05-28 – 2019-05-29 (×2): 20 mg via ORAL
  Filled 2019-05-28 (×2): qty 1

## 2019-05-28 MED ORDER — AMLODIPINE BESYLATE 5 MG PO TABS
5.0000 mg | ORAL_TABLET | Freq: Every day | ORAL | Status: DC
  Administered 2019-05-28 – 2019-05-29 (×2): 5 mg via ORAL
  Filled 2019-05-28 (×2): qty 1

## 2019-05-28 MED ORDER — SODIUM CHLORIDE 0.9 % IV SOLN: 250.0000 mL | INTRAVENOUS | Status: DC | PRN

## 2019-05-28 MED ORDER — LIDOCAINE HCL (PF) 1 % IJ SOLN
5.0000 mL | INTRAMUSCULAR | Status: DC | PRN
  Filled 2019-05-28: qty 5

## 2019-05-28 NOTE — ED Notes (Signed)
Attempt to ambulate in hall, SAT 99 however markedly short of breath and has to stop to rest in 20 ft.

## 2019-05-28 NOTE — Progress Notes (Signed)
Hd completed 

## 2019-05-28 NOTE — Progress Notes (Signed)
Pt has passport at bedside

## 2019-05-28 NOTE — ED Notes (Signed)
Pt transported to Dialysis by ED tech.

## 2019-05-28 NOTE — Consult Note (Addendum)
[address] Danny [name] MRN: [id number] DOB/AGE: 63-Oct-1957 63 y.o. Primary Care Physician:Patient, No Pcp Per Admit date: 05/28/2019 Chief Complaint:  Chief Complaint  Patient presents with  . Shortness of Breath   HPI: Patient is 63 year old male of [place] descent with a past medical history of ESRD who came to the ER with chief complaint of shortness of breath.   History of present illness dates back to yesterday when patient started feeling short of breath.  Patient complains of increasing shortness of breath with exercise.   Patient also complains of orthopnea.  Patient explained his orthopnea saying I cannot lay flat I need to sit on a chair to sleep.   Patient offers no other specific complaints.    No complaint of chest pain.   No complaint of fever cough or chills.    No complaint of nausea vomiting diarrhea.   No history of recent contact/exposure with any cold with patient.    No complaint of frequency urgency dysuria.   No gross hematuria.  No complaint of blood in stool.   Patient does give a history of missing dialysis treatment.  Patient says usually stays 3 hours.   Past Medical History:  Diagnosis Date  . Diabetes mellitus without complication ([place])   . Dialysis patient [facility])    tuesday, thursday and saturday  . Gout   . Hypertension   . Kidney disease        Family History  Problem Relation Age of Onset  . Diabetes Mother   . Prostate cancer Father   . Kidney disease Sister     Social History:  reports that he has never smoked. He has never used smokeless tobacco. He reports that he does not drink alcohol or use drugs.   Allergies: No Known Allergies  (Not in a hospital admission)      [id number] from the symptoms mentioned above,there are no other symptoms referable to all systems reviewed.      Physical Exam: Vital signs in last 24 hours: Temp:  [98.2 F (36.8 C)-98.6 F (37 C)] 98.6 F (37 C) (08/29 1500) Pulse Rate:  [52-74] 74 (08/29  1545) Resp:  [12-19] 17 (08/29 1545) BP: (144-165)/(67-88) 151/79 (08/29 1545) SpO2:  [96 %-99 %] 99 % (08/29 1530) Weight:  [94.4 kg-96.9 kg] 96.9 kg (08/29 1500) Weight change:     Intake/Output from previous day: No intake/output data recorded. No intake/output data recorded.   Physical Exam: General- pt is awake,alert, oriented to time place and person Resp- No acute REsp distress, minimal Rhonchi CVS- S1S2 regular ij rate and rhythm GIT- BS+, soft, NT, ND EXT- NO LE Edema, Cyanosis CNS- CN 2-12 grossly intact. Moving all 4 extremities Psych- normal mod and affect Access- AVF/   Lab Results: CBC Recent Labs    05/28/19 0545  WBC 6.4  HGB 8.5*  HCT 26.8*  PLT 143*    BMET Recent Labs    05/28/19 0545  NA 137  K 4.5  CL 96*  CO2 30  GLUCOSE 216*  BUN 36*  CREATININE 5.49*  CALCIUM 9.5    MICRO Recent Results (from the past 240 hour(s))  SARS Coronavirus 2 [facility] order, Performed in [facility] hospital lab) Nasopharyngeal Nasopharyngeal Swab     Status: None   Collection Time: 05/28/19 10:22 AM   Specimen: Nasopharyngeal Swab  Result Value Ref Range Status   SARS Coronavirus 2 NEGATIVE NEGATIVE Final    Comment: (NOTE) If result is NEGATIVE SARS-CoV-2 target nucleic acids  are NOT DETECTED. The SARS-CoV-2 RNA is generally detectable in upper and lower  respiratory specimens during the acute phase of infection. The lowest  concentration of SARS-CoV-2 viral copies this assay can detect is 250  copies / mL. A negative result does not preclude SARS-CoV-2 infection  and should not be used as the sole basis for treatment or other  patient management decisions.  A negative result may occur with  improper specimen collection / handling, submission of specimen other  than nasopharyngeal swab, presence of viral mutation(s) within the  areas targeted by this assay, and inadequate number of viral copies  (<250 copies / mL). A negative result must be  combined with clinical  observations, patient history, and epidemiological information. If result is POSITIVE SARS-CoV-2 target nucleic acids are DETECTED. The SARS-CoV-2 RNA is generally detectable in upper and lower  respiratory specimens dur ing the acute phase of infection.  Positive  results are indicative of active infection with SARS-CoV-2.  Clinical  correlation with patient history and other diagnostic information is  necessary to determine patient infection status.  Positive results do  not rule out bacterial infection or co-infection with other viruses. If result is PRESUMPTIVE POSTIVE SARS-CoV-2 nucleic acids MAY BE PRESENT.   A presumptive positive result was obtained on the submitted specimen  and confirmed on repeat testing.  While 2019 novel coronavirus  (SARS-CoV-2) nucleic acids may be present in the submitted sample  additional confirmatory testing may be necessary for epidemiological  and / or clinical management purposes  to differentiate between  SARS-CoV-2 and other Sarbecovirus currently known to infect humans.  If clinically indicated additional testing with an alternate test  methodology [phone]) is advised. The SARS-CoV-2 RNA is generally  detectable in upper and lower respiratory sp ecimens during the acute  phase of infection. The expected result is Negative. Fact Sheet for Patients:  StrictlyIdeas.no Fact Sheet for Healthcare Providers: BankingDealers.co.za This test is not yet approved or cleared by the [place] FDA and has been authorized for detection and/or diagnosis of SARS-CoV-2 by FDA under an Emergency Use Authorization (EUA).  This EUA will remain in effect (meaning this test can be used) for the duration of the COVID-19 declaration under Section 564(b)(1) of the Act, 21 U.S.C. section 360bbb-3(b)(1), unless the authorization is terminated or revoked sooner. Performed at [facility], [address] [place], [place] [address]       Lab Results  Component Value Date   CALCIUM 9.5 05/28/2019   PHOS 3.8 05/13/2016    CXR done today showed Low-grade congestive heart failure. Cardiomegaly. Pulmonary venous hypertension. Fluid in the fissures  Impression: 1)Renal  ESRD on HD                Pt is on MWF schedule               Pt was last dilayzed yesterday but pt has hx of non adherence to tx. Pt cut his tx short yesterday  2)HTN  Bp stable  3)Anemia HGb not  at goal (9--11) Will keep on ESA  4)CKD Mineral-Bone Disorder  Phosphorus at goal. cakcium is at goal.  5)CHF- admitted with fluid overload Will dialyze today PMD following  6)Electrolytes Normokalemic NOrmonatremic   7)Acid base Co2 at goal     Plan:  Will dialyze today Pt not willing to go for full tx so Will dialyze for 3 hours. Will try to take 2 liters off. Will continue to follow .  [place] S 05/28/2019, 3:54 PM

## 2019-05-28 NOTE — ED Notes (Signed)
Report given to floor. Pt to be transported from dialysis to room 216.

## 2019-05-28 NOTE — H&P (Addendum)
[facility] NAME: Danny [name]    MR#:  [id number]  DATE OF BIRTH:  September 23, 1956  DATE OF ADMISSION:  05/28/2019  PRIMARY CARE PHYSICIAN: Patient, No Pcp Per   REQUESTING/REFERRING PHYSICIAN: Quentin Cornwall  CHIEF COMPLAINT:   Chief Complaint  Patient presents with  . Shortness of Breath    HISTORY OF PRESENT ILLNESS: Danny [name]  is a 63 y.o. male with a known history of diabetes mellitus,  Hypertension, end-stage renal disease on hemodialysis-has regular dialysis days Monday Wednesday and Friday.  His hemodialysis was done yesterday in full timing. For last 2 days he is feeling increasingly short of breath especially at night.  He also had some edema around his ankles.  He denies any chest pain cough or fever.  He feels short of breath with minimal exertion. Concerned with this he came to emergency room today and noted to have some pulmonary edema and cardiomegaly on chest x-ray.  He does not have cardiac history.  ER physician contacted me and nephrologist to admit and work on his new diagnosis of CHF and get him hemodialysis.  PAST MEDICAL HISTORY:   Past Medical History:  Diagnosis Date  . Diabetes mellitus without complication ([place])   . Dialysis patient [facility])    tuesday, thursday and saturday  . Gout   . Hypertension   . Kidney disease     PAST SURGICAL HISTORY:  Past Surgical History:  Procedure Laterality Date  . AV FISTULA PLACEMENT    . miscellaneous     peritoneal dialysis catheter placement and removal    SOCIAL HISTORY:  Social History   Tobacco Use  . Smoking status: Never Smoker  . Smokeless tobacco: Never Used  Substance Use Topics  . Alcohol use: No    FAMILY HISTORY:  Family History  Problem Relation Age of Onset  . Diabetes Mother   . Prostate cancer Father   . Kidney disease Sister     DRUG ALLERGIES: No Known Allergies  REVIEW OF SYSTEMS:   CONSTITUTIONAL: No fever, fatigue or  weakness.  EYES: No blurred or double vision.  EARS, NOSE, AND THROAT: No tinnitus or ear pain.  RESPIRATORY: No cough, have shortness of breath, no wheezing or hemoptysis.  CARDIOVASCULAR: No chest pain, have orthopnea, edema.  GASTROINTESTINAL: No nausea, vomiting, diarrhea or abdominal pain.  GENITOURINARY: No dysuria, hematuria.  ENDOCRINE: No polyuria, nocturia,  HEMATOLOGY: No anemia, easy bruising or bleeding SKIN: No rash or lesion. MUSCULOSKELETAL: No joint pain or arthritis.   NEUROLOGIC: No tingling, numbness, weakness.  PSYCHIATRY: No anxiety or depression.   MEDICATIONS AT HOME:  Prior to Admission medications   Medication Sig Start Date End Date Taking? Authorizing Provider  amLODipine (NORVASC) 5 MG tablet Take 5 mg by mouth daily.    [provider]  calcium acetate (PHOSLO) 667 MG capsule Take 1,334-2,668 mg by mouth See admin instructions. Take 4 capsules (2668MG ) by mouth 3 times daily with meals and 2 capsules (1334MG ) by mouth 2 times daily with snacks    [provider]  citalopram (CELEXA) 20 MG tablet Take 20 mg by mouth daily.     [provider]  insulin aspart protamine- aspart (NOVOLOG MIX 70/30) (70-30) 100 UNIT/ML injection Inject 10-12 Units into the skin See admin instructions. Inject 12u under the skin every morning and 10u under the skin at night    [provider]  liraglutide (VICTOZA) 18 MG/3ML SOPN  Inject 0.6 mg into the skin daily.    [provider]  lisinopril (PRINIVIL,ZESTRIL) 20 MG tablet Take 20 mg by mouth daily.    [provider]  loperamide (IMODIUM) 2 MG capsule Take 1 capsule (2 mg total) by mouth every 6 (six) hours as needed for diarrhea or loose stools. 07/29/18   Henreitta Leber, MD  omeprazole (PRILOSEC OTC) 20 MG tablet Take 20 mg by mouth daily.    [provider]  sevelamer carbonate (RENVELA) 800 MG tablet Take 3,200 mg by mouth 3 (three) times daily with meals.     [provider]  traMADol (ULTRAM) 50 MG tablet Take 1 tablet (50 mg total) by mouth every 6 (six) hours as needed. 12/06/18 12/06/19  Nance Pear, MD      PHYSICAL EXAMINATION:   VITAL SIGNS: Blood pressure (!) 165/88, pulse (!) 52, temperature 98.2 F (36.8 C), temperature source Oral, resp. rate 16, height 5\' 8"  (1.727 m), weight 94.4 kg, SpO2 97 %.  GENERAL:  63 y.o.-year-old patient lying in the bed with no acute distress.  EYES: Pupils equal, round, reactive to light and accommodation. No scleral icterus. Extraocular muscles intact.  HEENT: Head atraumatic, normocephalic. Oropharynx and nasopharynx clear.  NECK:  Supple, no jugular venous distention. No thyroid enlargement, no tenderness.  LUNGS: Normal breath sounds bilaterally, no wheezing, bilateral crepitation. No use of accessory muscles of respiration.  CARDIOVASCULAR: S1, S2 normal. No murmurs, rubs, or gallops.  ABDOMEN: Soft, nontender, nondistended. Bowel sounds present. No organomegaly or mass.  EXTREMITIES: Minimal pedal edema, no cyanosis, or clubbing.  Left arm AV fistula present. NEUROLOGIC: Cranial nerves II through XII are intact. Muscle strength 5/5 in all extremities. Sensation intact. Gait not checked.  PSYCHIATRIC: The patient is alert and oriented x 3.  SKIN: No obvious rash, lesion, or ulcer.   LABORATORY PANEL:   CBC Recent Labs  Lab 05/28/19 0545  WBC 6.4  HGB 8.5*  HCT 26.8*  PLT 143*  MCV 102.3*  MCH 32.4  MCHC 31.7  RDW 13.6   ------------------------------------------------------------------------------------------------------------------  Chemistries  Recent Labs  Lab 05/28/19 0545  NA 137  K 4.5  CL 96*  CO2 30  GLUCOSE 216*  BUN 36*  CREATININE 5.49*  CALCIUM 9.5   ------------------------------------------------------------------------------------------------------------------ estimated creatinine clearance is 15.4 mL/min (A) (by C-G formula based on SCr of 5.49  mg/dL (H)). ------------------------------------------------------------------------------------------------------------------ No results for input(s): TSH, T4TOTAL, T3FREE, THYROIDAB in the last 72 hours.  Invalid input(s): FREET3   Coagulation profile No results for input(s): INR, PROTIME in the last 168 hours. ------------------------------------------------------------------------------------------------------------------- No results for input(s): DDIMER in the last 72 hours. -------------------------------------------------------------------------------------------------------------------  Cardiac Enzymes No results for input(s): CKMB, TROPONINI, MYOGLOBIN in the last 168 hours.  Invalid input(s): CK ------------------------------------------------------------------------------------------------------------------ Invalid input(s): POCBNP  ---------------------------------------------------------------------------------------------------------------  Urinalysis    Component Value Date/Time   COLORURINE YELLOW (A) 09/22/2017 1006   APPEARANCEUR CLEAR (A) 09/22/2017 1006   APPEARANCEUR Clear 09/10/2013 2254   LABSPEC 1.008 09/22/2017 1006   LABSPEC 1.003 09/10/2013 2254   PHURINE 9.0 (H) 09/22/2017 1006   GLUCOSEU 150 (A) 09/22/2017 1006   GLUCOSEU >=500 09/10/2013 2254   HGBUR NEGATIVE 09/22/2017 1006   BILIRUBINUR NEGATIVE 09/22/2017 1006   BILIRUBINUR Negative 09/10/2013 [place] 09/22/2017 1006   PROTEINUR 100 (A) 09/22/2017 1006   NITRITE NEGATIVE 09/22/2017 1006   LEUKOCYTESUR NEGATIVE 09/22/2017 1006   LEUKOCYTESUR Negative 09/10/2013 2254     RADIOLOGY: Dg Chest 2 View  Result  Date: 05/28/2019 CLINICAL DATA:  Shortness of breath over the last 3 weeks. EXAM: CHEST - 2 VIEW COMPARISON:  12/06/2018 FINDINGS: Chronic cardiomegaly. Chronic aortic atherosclerosis. Pulmonary venous hypertension. No frank pulmonary edema. Small amount of fluid in the  fissures. No significant bone finding. IMPRESSION: Low-grade congestive heart failure. Cardiomegaly. Pulmonary venous hypertension. Fluid in the fissures. Electronically Signed   By: Nelson Chimes M.D.   On: 05/28/2019 08:36    EKG: Orders placed or performed during the hospital encounter of 05/28/19  . ED EKG  . ED EKG    IMPRESSION AND PLAN:  *Fluid overload, acute diastolic CHF Patient does not have a cardiac history. His 2 troponins are without much changes or elevation. I will get echocardiogram and get cardiology evaluation. He is on ACE inhibitor, I will add small dose of beta-blockers. Nephrologist is contacted to decide need for urgent dialysis today.  *End-stage renal disease on hemodialysis As mentioned above, nephrologist made aware about need for additional dialysis.  *Hypertension Continue home medications.  Carvedilol added as mentioned above.  *Diabetes mellitus Keep on sliding scale coverage.  Hold his home dose of insulin.  Continue Victoza.  *Gastroesophageal reflux disease Continue omeprazole.   All the records are reviewed and case discussed with ED provider. Management plans discussed with the patient, family and they are in agreement.  CODE STATUS: Full code. Code Status History    Date Active Date Inactive Code Status Order ID Comments User Context   07/27/2018 2151 07/29/2018 1953 Full Code [id number]  Henreitta Leber, MD Inpatient   09/22/2017 1005 09/23/2017 2224 Full Code [id number]  Hillary Bow, MD ED   10/02/2016 1203 10/04/2016 2107 Full Code [id number]  Epifanio Lesches, MD ED   05/13/2016 1954 05/14/2016 1823 Full Code [id number]  Gladstone Lighter, MD Inpatient   Advance Care Planning Activity       TOTAL TIME TAKING CARE OF THIS PATIENT: 45 minutes.    Vaughan Basta M.D on 05/28/2019   Between 7am to 6pm - Pager - [phone]  After 6pm go to [email or web address] - password [email or web address] [facility]  Office   [phone]  CC: Primary care physician; Patient, No Pcp Per   Note: This dictation was prepared with Dragon dictation along with smaller phrase technology. Any transcriptional errors that result from this process are unintentional.

## 2019-05-28 NOTE — Progress Notes (Signed)
Post dialysis assessment 

## 2019-05-28 NOTE — ED Triage Notes (Addendum)
Patient with complaint of shortness of breath that started yesterday. Patient denies chest pain.patient is a dialysis patient. Patient's last dialysis was yesterday. Patient denies any swelling or weight gain.

## 2019-05-28 NOTE — ED Notes (Signed)
ED TO INPATIENT HANDOFF REPORT  ED Nurse Name and Phone #:  Maudie Mercury [phone]  S Name/Age/Gender Gardner 63 y.o. male Room/Bed: ED03A/ED03A  Code Status   Code Status: Prior  Home/SNF/Other Home Patient oriented to: self, place, time and situation Is this baseline? Yes   Triage Complete: Triage complete  Chief Complaint short of breath  Triage Note Patient with complaint of shortness of breath that started yesterday. Patient denies chest pain.patient is a dialysis patient. Patient's last dialysis was yesterday. Patient denies any swelling or weight gain.    Allergies No Known Allergies  Level of Care/Admitting Diagnosis ED Disposition    ED Disposition Condition [facility] Area: [place] [100120]  Level of Care: Med-Surg [16]  Covid Evaluation: Confirmed COVID Negative  Diagnosis: CHF exacerbation [facility]) [id number]  Admitting Physician: Vaughan Basta [phone]  Attending Physician: Vaughan Basta [phone]  Estimated length of stay: past midnight tomorrow  Certification:: I certify this patient will need inpatient services for at least 2 midnights  PT Class (Do Not Modify): Inpatient [101]  PT Acc Code (Do Not Modify): Private [1]       B Medical/Surgery History Past Medical History:  Diagnosis Date  . Diabetes mellitus without complication ([place])   . Dialysis patient [facility])    tuesday, thursday and saturday  . Gout   . Hypertension   . Kidney disease    Past Surgical History:  Procedure Laterality Date  . AV FISTULA PLACEMENT    . miscellaneous     peritoneal dialysis catheter placement and removal     A IV Location/Drains/Wounds Patient Lines/Drains/Airways Status   Active Line/Drains/Airways    Name:   Placement date:   Placement time:   Site:   Days:   Peripheral IV 05/28/19 Right Hand   05/28/19    1255    Hand   less than 1   Fistula / Graft Left Upper arm Arteriovenous fistula   -    -     Upper arm      Fistula / Graft Left Upper arm Arteriovenous fistula   09/23/17    0955    Upper arm   612          Intake/Output Last 24 hours No intake or output data in the 24 hours ending 05/28/19 1642  Labs/Imaging Results for orders placed or performed during the hospital encounter of 05/28/19 (from the past 48 hour(s))  Basic metabolic panel     Status: Abnormal   Collection Time: 05/28/19  5:45 AM  Result Value Ref Range   Sodium 137 135 - 145 mmol/L   Potassium 4.5 3.5 - 5.1 mmol/L   Chloride 96 (L) 98 - 111 mmol/L   CO2 30 22 - 32 mmol/L   Glucose, Bld 216 (H) 70 - 99 mg/dL   BUN 36 (H) 8 - 23 mg/dL   Creatinine, Ser 5.49 (H) 0.61 - 1.24 mg/dL   Calcium 9.5 8.9 - 10.3 mg/dL   GFR calc non Af Amer 10 (L) >60 mL/min   GFR calc Af Amer 12 (L) >60 mL/min   Anion gap 11 5 - 15    Comment: Performed at [facility], [address] [place], [place] [address]  CBC     Status: Abnormal   Collection Time: 05/28/19  5:45 AM  Result Value Ref Range   WBC 6.4 4.0 - 10.5 K/uL   RBC 2.62 (L) 4.22 - 5.81 MIL/uL  Hemoglobin 8.5 (L) 13.0 - 17.0 g/dL   HCT 26.8 (L) 39.0 - 52.0 %   MCV 102.3 (H) 80.0 - 100.0 fL   MCH 32.4 26.0 - 34.0 pg   MCHC 31.7 30.0 - 36.0 g/dL   RDW 13.6 11.5 - 15.5 %   Platelets 143 (L) 150 - 400 K/uL   nRBC 0.0 0.0 - 0.2 %    Comment: Performed at [facility], [place], [place] [address]  Troponin I (High Sensitivity)     Status: Abnormal   Collection Time: 05/28/19  5:45 AM  Result Value Ref Range   Troponin I (High Sensitivity) 47 (H) <18 ng/L    Comment: (NOTE) Elevated high sensitivity troponin I (hsTnI) values and significant  changes across serial measurements may suggest ACS but many other  chronic and acute conditions are known to elevate hsTnI results.  Refer to the "Links" section for chest pain algorithms and additional  guidance. Performed at [facility], [address]  [place], [place] [address]   Brain natriuretic peptide     Status: Abnormal   Collection Time: 05/28/19  5:45 AM  Result Value Ref Range   B Natriuretic Peptide 4,023.0 (H) 0.0 - 100.0 pg/mL    Comment: Performed at [facility], [place], [place] [address]  Troponin I (High Sensitivity)     Status: Abnormal   Collection Time: 05/28/19 10:22 AM  Result Value Ref Range   Troponin I (High Sensitivity) 44 (H) <18 ng/L    Comment: (NOTE) Elevated high sensitivity troponin I (hsTnI) values and significant  changes across serial measurements may suggest ACS but many other  chronic and acute conditions are known to elevate hsTnI results.  Refer to the "Links" section for chest pain algorithms and additional  guidance. Performed at [facility], [address] [place],  [address]   SARS Coronavirus 2 [facility] order, Performed in [facility] hospital lab) Nasopharyngeal Nasopharyngeal Swab     Status: None   Collection Time: 05/28/19 10:22 AM   Specimen: Nasopharyngeal Swab  Result Value Ref Range   SARS Coronavirus 2 NEGATIVE NEGATIVE    Comment: (NOTE) If result is NEGATIVE SARS-CoV-2 target nucleic acids are NOT DETECTED. The SARS-CoV-2 RNA is generally detectable in upper and lower  respiratory specimens during the acute phase of infection. The lowest  concentration of SARS-CoV-2 viral copies this assay can detect is 250  copies / mL. A negative result does not preclude SARS-CoV-2 infection  and should not be used as the sole basis for treatment or other  patient management decisions.  A negative result may occur with  improper specimen collection / handling, submission of specimen other  than nasopharyngeal swab, presence of viral mutation(s) within the  areas targeted by this assay, and inadequate number of viral copies  (<250 copies / mL). A negative result must be combined with clinical  observations, patient history, and epidemiological  information. If result is POSITIVE SARS-CoV-2 target nucleic acids are DETECTED. The SARS-CoV-2 RNA is generally detectable in upper and lower  respiratory specimens dur ing the acute phase of infection.  Positive  results are indicative of active infection with SARS-CoV-2.  Clinical  correlation with patient history and other diagnostic information is  necessary to determine patient infection status.  Positive results do  not rule out bacterial infection or co-infection with other viruses. If result is PRESUMPTIVE POSTIVE SARS-CoV-2 nucleic acids MAY BE PRESENT.   A presumptive  positive result was obtained on the submitted specimen  and confirmed on repeat testing.  While 2019 novel coronavirus  (SARS-CoV-2) nucleic acids may be present in the submitted sample  additional confirmatory testing may be necessary for epidemiological  and / or clinical management purposes  to differentiate between  SARS-CoV-2 and other Sarbecovirus currently known to infect humans.  If clinically indicated additional testing with an alternate test  methodology [phone]) is advised. The SARS-CoV-2 RNA is generally  detectable in upper and lower respiratory sp ecimens during the acute  phase of infection. The expected result is Negative. Fact Sheet for Patients:  StrictlyIdeas.no Fact Sheet for Healthcare Providers: BankingDealers.co.za This test is not yet approved or cleared by the [place] FDA and has been authorized for detection and/or diagnosis of SARS-CoV-2 by FDA under an Emergency Use Authorization (EUA).  This EUA will remain in effect (meaning this test can be used) for the duration of the COVID-19 declaration under Section 564(b)(1) of the Act, 21 U.S.C. section 360bbb-3(b)(1), unless the authorization is terminated or revoked sooner. Performed at [facility], [address] [place], [place] [address]    Dg Chest 2  View  Result Date: 05/28/2019 CLINICAL DATA:  Shortness of breath over the last 3 weeks. EXAM: CHEST - 2 VIEW COMPARISON:  12/06/2018 FINDINGS: Chronic cardiomegaly. Chronic aortic atherosclerosis. Pulmonary venous hypertension. No frank pulmonary edema. Small amount of fluid in the fissures. No significant bone finding. IMPRESSION: Low-grade congestive heart failure. Cardiomegaly. Pulmonary venous hypertension. Fluid in the fissures. Electronically Signed   By: Nelson Chimes M.D.   On: 05/28/2019 08:36    Pending Labs Unresulted Labs (From admission, onward)    Start     Ordered   05/28/19 1246  Hemoglobin A1c  Once,   STAT    Comments: To assess prior glycemic control    05/28/19 1245   Signed and Held  HIV antibody (Routine Testing)  Once,   R     Signed and Held   Signed and Held  Basic metabolic panel  Daily,   R     Signed and Held   Signed and Held  CBC  (heparin)  Once,   R    Comments: Baseline for heparin therapy IF NOT ALREADY DRAWN.  Notify MD if PLT < 100 K.    Signed and Held   Signed and Held  Creatinine, serum  (heparin)  Once,   R    Comments: Baseline for heparin therapy IF NOT ALREADY DRAWN.    Signed and Held          Vitals/Pain Today's Vitals   05/28/19 1545 05/28/19 1600 05/28/19 1615 05/28/19 1630  BP: (!) 151/79 (!) 155/82 (!) 160/80 (!) 159/85  Pulse: 74 75 73 77  Resp: 17 (!) 22 18 17   Temp:      TempSrc:      SpO2:      Weight:      Height:      PainSc:        Isolation Precautions No active isolations  Medications Medications  insulin aspart (novoLOG) injection 0-9 Units (has no administration in time range)  insulin aspart (novoLOG) injection 0-5 Units (has no administration in time range)  carvedilol (COREG) tablet 3.125 mg (has no administration in time range)  Chlorhexidine Gluconate Cloth 2 % PADS 6 each (has no administration in time range)  pentafluoroprop-tetrafluoroeth (GEBAUERS) aerosol 1 application (has no administration in  time range)  lidocaine (PF) (XYLOCAINE) 1 %  injection 5 mL (has no administration in time range)  lidocaine-prilocaine (EMLA) cream 1 application (has no administration in time range)  0.9 %  sodium chloride infusion (has no administration in time range)  0.9 %  sodium chloride infusion (has no administration in time range)  heparin injection 1,000 Units (has no administration in time range)  alteplase (CATHFLO ACTIVASE) injection 2 mg (has no administration in time range)  heparin injection 1,900 Units (has no administration in time range)  diphenhydrAMINE (BENADRYL) injection 12.5 mg (12.5 mg Intravenous Given 05/28/19 1332)    Mobility walks     Focused Assessments Cardiac Assessment Handoff:    Lab Results  Component Value Date   CKTOTAL 134 09/10/2013   CKMB 2.4 09/10/2013   TROPONINI 0.06 ([place]) 07/27/2018   No results found for: DDIMER Does the Patient currently have chest pain? No     R Recommendations: See Admitting Provider Note  Report given to:   Additional Notes:

## 2019-05-28 NOTE — Progress Notes (Signed)
Hd started  

## 2019-05-28 NOTE — ED Provider Notes (Signed)
[facility] Emergency Department Provider Note  ____________________________________________   First MD Initiated Contact with Patient 05/28/19 0930     (approximate)  I have reviewed the triage vital signs and the nursing notes.  History  Chief Complaint Shortness of Breath    HPI Danny [name] is a 63 y.o. male ESRD on dialysis (M/W/F), HTN, DM who presents for SOB, DOE, and orthopnea x 2 days.  Patient denies any fevers, cough, nausea, vomiting, diarrhea.  He reports compliance with his dialysis sessions.  He states on Friday he was approximately 30 minutes short, but otherwise has completed full sessions.  He denies any sick contacts.  He also reports noticing some new LE swelling over the same time period.          Past Medical Hx Past Medical History:  Diagnosis Date  . Diabetes mellitus without complication ([place])   . Dialysis patient [facility])    tuesday, thursday and saturday  . Gout   . Hypertension   . Kidney disease     Problem List Patient Active Problem List   Diagnosis Date Noted  . Acute diarrhea 07/27/2018  . Viral gastroenteritis 10/04/2016  . Anemia 10/04/2016  . Thrombocytopenia ([name]) 10/04/2016  . Rectal bleeding 10/04/2016  . Hyperkalemia 10/02/2016  . Pulmonary edema 05/13/2016    Past Surgical Hx Past Surgical History:  Procedure Laterality Date  . AV FISTULA PLACEMENT    . miscellaneous     peritoneal dialysis catheter placement and removal    Medications Prior to Admission medications   Medication Sig Start Date End Date Taking? Authorizing Provider  amLODipine (NORVASC) 5 MG tablet Take 5 mg by mouth daily.    [provider]  calcium acetate (PHOSLO) 667 MG capsule Take 1,334-2,668 mg by mouth See admin instructions. Take 4 capsules (2668MG ) by mouth 3 times daily with meals and 2 capsules (1334MG ) by mouth 2 times daily with snacks    [provider]  citalopram (CELEXA) 20 MG tablet  Take 20 mg by mouth daily.     [provider]  insulin aspart protamine- aspart (NOVOLOG MIX 70/30) (70-30) 100 UNIT/ML injection Inject 10-12 Units into the skin See admin instructions. Inject 12u under the skin every morning and 10u under the skin at night    [provider]  liraglutide (VICTOZA) 18 MG/3ML SOPN Inject 0.6 mg into the skin daily.    [provider]  lisinopril (PRINIVIL,ZESTRIL) 20 MG tablet Take 20 mg by mouth daily.    [provider]  loperamide (IMODIUM) 2 MG capsule Take 1 capsule (2 mg total) by mouth every 6 (six) hours as needed for diarrhea or loose stools. 07/29/18   Henreitta Leber, MD  omeprazole (PRILOSEC OTC) 20 MG tablet Take 20 mg by mouth daily.    [provider]  sevelamer carbonate (RENVELA) 800 MG tablet Take 3,200 mg by mouth 3 (three) times daily with meals.    [provider]  traMADol (ULTRAM) 50 MG tablet Take 1 tablet (50 mg total) by mouth every 6 (six) hours as needed. 12/06/18 12/06/19  Nance Pear, MD    Allergies Patient has no known allergies.  Family Hx Family History  Problem Relation Age of Onset  . Diabetes Mother   . Prostate cancer Father   . Kidney disease Sister     Social Hx Social History   Tobacco Use  . Smoking status: Never Smoker  . Smokeless tobacco: Never Used  Substance Use Topics  . Alcohol use: No  . Drug use: No     Review of Systems  Constitutional: Negative for fever. Negative for chills. Eyes: Negative for visual changes. ENT: Negative for sore throat. Cardiovascular: Negative for chest pain. Respiratory: + for shortness of breath. Gastrointestinal: Negative for abdominal pain. Negative for nausea. Negative for vomiting. Genitourinary: Negative for dysuria. Musculoskeletal: + for leg swelling. Skin: Negative for rash. Neurological: Negative for for headaches.   Physical Exam  Vital Signs: ED Triage Vitals  Enc Vitals Group     BP  05/28/19 0526 (!) 158/67     Pulse Rate 05/28/19 0526 (!) 58     Resp 05/28/19 0526 18     Temp 05/28/19 0526 98.2 F (36.8 C)     Temp Source 05/28/19 0526 Oral     SpO2 05/28/19 0526 96 %     Weight 05/28/19 0527 208 lb 1.8 oz (94.4 kg)     Height 05/28/19 0527 5\' 8"  (1.727 m)     Head Circumference --      Peak Flow --      Pain Score 05/28/19 0527 0     Pain Loc --      Pain Edu? --      Excl. in [place]? --     Constitutional: Alert and oriented.  Eyes: Conjunctivae clear. Sclera anicteric. Head: Normocephalic. Atraumatic. Nose: No congestion. No rhinorrhea. Mouth/Throat: Mucous membranes are moist.  Neck: No stridor.   Cardiovascular: Normal rate, regular rhythm. Extremities well perfused. AVF in LUE with palpable thrill. Respiratory: Normal respiratory effort.  Lungs CTAB. Gastrointestinal: Soft and non-tender. No distention.  Musculoskeletal: 3+ symmetric pitting edema to the level of the knees. Neurologic:  Normal speech and language. No gross focal neurologic deficits are appreciated.  Skin: Skin is warm, dry and intact. No rash noted. Psychiatric: Mood and affect are appropriate for situation.  EKG  Personally reviewed.   Rate: 58 Rhythm: sinus Axis: normal Intervals: PR 222 ms TWI in I, avL, seen on prior Non-specific ST/T changes, but no acute ischemic findings No STEMI   Radiology  CXR: IMPRESSION: Low-grade congestive heart failure. Cardiomegaly. Pulmonary venous hypertension. Fluid in the fissures.   Procedures  Procedure(s) performed (including critical care):  Procedures   Initial Impression / Assessment and Plan / ED Course  63 y.o. male with a history of ESRD on HD, hypertension, diabetes who presents to the ED for 2 days of shortness of breath, dyspnea on exertion, and orthopnea.  Exam notable for bilateral 3+ pitting edema to the lower extremities to the level of the knees.  Ddx: new onset heart failure, volume overload related to  dialysis (though he reports 100% compliance, and was only approximately 30 minutes short on Friday), COVID  Plan: labs, XR, likely admit  XR c/w heart failure. BNP 4000.  High-sensitivity troponins slightly elevated at 40, likely his ESRD status contributing, no acute ischemic changes on his EKG.  In the ED, he is unable to ambulate more than 15-20 feet without becoming extremely dyspneic.  Concern for new onset heart failure (likely worsened and complicated by his ESRD status), discussed with hospitalist for admission.  Patient agreeable with plan.   Final Clinical Impression(s) / ED Diagnosis  Final diagnoses:  SOB (shortness of breath)  Orthopnea  DOE (dyspnea on exertion)       Note:  This document was prepared using Dragon voice recognition software and may include unintentional dictation errors.   Derrell Lolling L.,  MD 05/28/19 1255

## 2019-05-28 NOTE — Progress Notes (Signed)
Family Meeting Note  Advance Directive:yes  Today a meeting took place with the Patient.  The following clinical team members were present during this meeting:MD  The following were discussed:Patient's diagnosis: End-stage renal disease on hemodialysis, hypertension, diabetes, Patient's progosis: Unable to determine and Goals for treatment: Full Code  Additional follow-up to be provided: Cardiology, nephrology  Time spent during discussion:20 minutes  Vaughan Basta, MD

## 2019-05-28 NOTE — ED Notes (Signed)
This EDT assisted pt to use the bedpan and get repositioned in the bed.

## 2019-05-28 NOTE — Progress Notes (Signed)
Pt complained of generalized itching. Primary nurse paged and spoke to [facility] NP. Orders received for Benadryl 25 mg once. Primary nurse to continue to monitor.

## 2019-05-28 NOTE — Consult Note (Signed)
Danny [name] is a 63 y.o. male  [id number]  Primary Cardiologist: Neoma Laming Reason for Consultation: Congestive heart failure  HPI: This is a 63 year old Hispanic male with history of diabetes hypertension end-stage renal disease, on dialysis 3 times a week presented with tightness in the chest and shortness of breath orthopnea PND and leg swelling.   Review of Systems: Feeling much better now denies any chest pain or shortness of breath   Past Medical History:  Diagnosis Date  . Diabetes mellitus without complication ([place])   . Dialysis patient [facility])    tuesday, thursday and saturday  . Gout   . Hypertension   . Kidney disease     (Not in a hospital admission)    . carvedilol  3.125 mg Oral BID WC  . [START ON 05/29/2019] Chlorhexidine Gluconate Cloth  6 each Topical Q0600  . insulin aspart  0-5 Units Subcutaneous QHS  . insulin aspart  0-9 Units Subcutaneous TID WC    Infusions: . sodium chloride    . sodium chloride      No Known Allergies  Social History   Socioeconomic History  . Marital status: Married    Spouse name: Not on file  . Number of children: Not on file  . Years of education: Not on file  . Highest education level: Not on file  Occupational History  . Not on file  Social Needs  . Financial resource strain: Not on file  . Food insecurity    Worry: Not on file    Inability: Not on file  . Transportation needs    Medical: Not on file    Non-medical: Not on file  Tobacco Use  . Smoking status: Never Smoker  . Smokeless tobacco: Never Used  Substance and Sexual Activity  . Alcohol use: No  . Drug use: No  . Sexual activity: Not on file  Lifestyle  . Physical activity    Days per week: Not on file    Minutes per session: Not on file  . Stress: Not on file  Relationships  . Social Herbalist on phone: Not on file    Gets together: Not on file    Attends religious service: Not on file    Active member of club or  organization: Not on file    Attends meetings of clubs or organizations: Not on file    Relationship status: Not on file  . Intimate partner violence    Fear of current or ex partner: Not on file    Emotionally abused: Not on file    Physically abused: Not on file    Forced sexual activity: Not on file  Other Topics Concern  . Not on file  Social History Narrative   Independent at baseline    Family History  Problem Relation Age of Onset  . Diabetes Mother   . Prostate cancer Father   . Kidney disease Sister     PHYSICAL EXAM: Vitals:   05/28/19 1745 05/28/19 1800  BP: (!) 166/83 (!) 148/98  Pulse: 80 80  Resp: 16 (!) 21  Temp:  97.7 F (36.5 C)  SpO2:       Intake/Output Summary (Last 24 hours) at 05/28/2019 1822 Last data filed at 05/28/2019 1800 Gross per 24 hour  Intake -  Output 2000 ml  Net -2000 ml    General:  Well appearing. No respiratory difficulty HEENT: normal Neck: supple. no JVD. Carotids 2+ bilat;  no bruits. No lymphadenopathy or thryomegaly appreciated. Cor: PMI nondisplaced. Regular rate & rhythm. No rubs, gallops or murmurs. Lungs: clear Abdomen: soft, nontender, nondistended. No hepatosplenomegaly. No bruits or masses. Good bowel sounds. Extremities: no cyanosis, clubbing, rash, edema Neuro: alert & oriented x 3, cranial nerves grossly intact. moves all 4 extremities w/o difficulty. Affect pleasant.  ECG: Sinus bradycardia 58 bpm with nonspecific interventricular conduction delay and first-degree AV block and nonspecific ST-T changes  Results for orders placed or performed during the hospital encounter of 05/28/19 (from the past 24 hour(s))  Basic metabolic panel     Status: Abnormal   Collection Time: 05/28/19  5:45 AM  Result Value Ref Range   Sodium 137 135 - 145 mmol/L   Potassium 4.5 3.5 - 5.1 mmol/L   Chloride 96 (L) 98 - 111 mmol/L   CO2 30 22 - 32 mmol/L   Glucose, Bld 216 (H) 70 - 99 mg/dL   BUN 36 (H) 8 - 23 mg/dL   Creatinine,  Ser 5.49 (H) 0.61 - 1.24 mg/dL   Calcium 9.5 8.9 - 10.3 mg/dL   GFR calc non Af Amer 10 (L) >60 mL/min   GFR calc Af Amer 12 (L) >60 mL/min   Anion gap 11 5 - 15  CBC     Status: Abnormal   Collection Time: 05/28/19  5:45 AM  Result Value Ref Range   WBC 6.4 4.0 - 10.5 K/uL   RBC 2.62 (L) 4.22 - 5.81 MIL/uL   Hemoglobin 8.5 (L) 13.0 - 17.0 g/dL   HCT 26.8 (L) 39.0 - 52.0 %   MCV 102.3 (H) 80.0 - 100.0 fL   MCH 32.4 26.0 - 34.0 pg   MCHC 31.7 30.0 - 36.0 g/dL   RDW 13.6 11.5 - 15.5 %   Platelets 143 (L) 150 - 400 K/uL   nRBC 0.0 0.0 - 0.2 %  Troponin I (High Sensitivity)     Status: Abnormal   Collection Time: 05/28/19  5:45 AM  Result Value Ref Range   Troponin I (High Sensitivity) 47 (H) <18 ng/L  Brain natriuretic peptide     Status: Abnormal   Collection Time: 05/28/19  5:45 AM  Result Value Ref Range   B Natriuretic Peptide 4,023.0 (H) 0.0 - 100.0 pg/mL  Troponin I (High Sensitivity)     Status: Abnormal   Collection Time: 05/28/19 10:22 AM  Result Value Ref Range   Troponin I (High Sensitivity) 44 (H) <18 ng/L  SARS Coronavirus 2 [facility] order, Performed in [facility] hospital lab) Nasopharyngeal Nasopharyngeal Swab     Status: None   Collection Time: 05/28/19 10:22 AM   Specimen: Nasopharyngeal Swab  Result Value Ref Range   SARS Coronavirus 2 NEGATIVE NEGATIVE   Dg Chest 2 View  Result Date: 05/28/2019 CLINICAL DATA:  Shortness of breath over the last 3 weeks. EXAM: CHEST - 2 VIEW COMPARISON:  12/06/2018 FINDINGS: Chronic cardiomegaly. Chronic aortic atherosclerosis. Pulmonary venous hypertension. No frank pulmonary edema. Small amount of fluid in the fissures. No significant bone finding. IMPRESSION: Low-grade congestive heart failure. Cardiomegaly. Pulmonary venous hypertension. Fluid in the fissures. Electronically Signed   By: Nelson Chimes M.D.   On: 05/28/2019 08:36     ASSESSMENT AND PLAN: Congestive heart failure with history of end-stage renal disease and  multiple risk factors for coronary artery disease.  Patient has mildly elevated troponin and denies any chest pain at this time and no acute changes on EKG.  Elevated troponin due to  end-stage renal disease as well as congestive heart failure versus type II myocardial infarction.  Advise getting echocardiogram and advised taking more fluid off during dialysis.  We will continue to follow the patient closely.  Thank you very much for referral.  , A

## 2019-05-29 ENCOUNTER — Inpatient Hospital Stay
Admit: 2019-05-29 | Discharge: 2019-05-29 | Disposition: A | Discharge status: Home / Self Care | Payer: Medicaid Other | Attending: Cardiovascular Disease | Admitting: Cardiovascular Disease

## 2019-05-29 ENCOUNTER — Inpatient Hospital Stay: Payer: Medicaid Other

## 2019-05-29 LAB — RENAL FUNCTION PANEL
Albumin: 3.6 g/dL (ref 3.5–5.0)
Anion gap: 11 (ref 5–15)
BUN: 35 mg/dL — ABNORMAL HIGH (ref 8–23)
CO2: 31 mmol/L (ref 22–32)
Calcium: 9.4 mg/dL (ref 8.9–10.3)
Chloride: 96 mmol/L — ABNORMAL LOW (ref 98–111)
Creatinine, Ser: 5.15 mg/dL — ABNORMAL HIGH (ref 0.61–1.24)
GFR calc Af Amer: 13 mL/min — ABNORMAL LOW (ref >60)
GFR calc non Af Amer: 11 mL/min — ABNORMAL LOW (ref >60)
Glucose, Bld: 118 mg/dL — ABNORMAL HIGH (ref 70–99)
Phosphorus: 3.9 mg/dL (ref 2.5–4.6)
Potassium: 4.9 mmol/L (ref 3.5–5.1)
Sodium: 138 mmol/L (ref 135–145)

## 2019-05-29 LAB — GLUCOSE, CAPILLARY: Glucose-Capillary: 99 mg/dL (ref 70–99)

## 2019-05-29 LAB — MRSA PCR SCREENING: MRSA by PCR: NEGATIVE

## 2019-05-29 LAB — ECHOCARDIOGRAM COMPLETE
Height: 68 in
Weight: 3333.36 oz

## 2019-05-29 MED ORDER — SACUBITRIL-VALSARTAN 24-26 MG PO TABS: 1.0000 | ORAL_TABLET | Freq: Two times a day (BID) | ORAL | Status: DC

## 2019-05-29 MED ORDER — CARVEDILOL 3.125 MG PO TABS: 3.1250 mg | ORAL_TABLET | Freq: Two times a day (BID) | ORAL | 0 refills | Status: DC

## 2019-05-29 MED ORDER — SACUBITRIL-VALSARTAN 24-26 MG PO TABS: 1.0000 | ORAL_TABLET | Freq: Two times a day (BID) | ORAL | 0 refills | Status: DC

## 2019-05-29 NOTE — Progress Notes (Signed)
Patient provided with discharge instructions. No signs of distress. Patient discharged.

## 2019-05-29 NOTE — Discharge Summary (Signed)
[place] [facility] [place] NAME: Danny [name]    MR#:  [id number]  DATE OF BIRTH:  [birth year]  DATE OF ADMISSION:  05/28/2019 ADMITTING PHYSICIAN: Vaughan Basta, MD  DATE OF DISCHARGE: 05/29/2019  PRIMARY CARE PHYSICIAN: Patient, No Pcp Per    ADMISSION DIAGNOSIS:  Orthopnea [R06.01] SOB (shortness of breath) [R06.02] DOE (dyspnea on exertion) [R06.09] Acute on chronic diastolic congestive heart failure (HCC) [I50.33]  DISCHARGE DIAGNOSIS:  Acute systolic CHF exacerbation ESRD with fluid overload  SECONDARY DIAGNOSIS:   Past Medical History:  Diagnosis Date  . Diabetes mellitus without complication ([place])   . Dialysis patient [facility])    tuesday, thursday and saturday  . Gout   . Hypertension   . Kidney disease     HOSPITAL COURSE:   63 year old male with a history of end-stage renal disease on dialysis and diabetes who presented to the emergency room due to shortness of breath.  1.  Acute hypoxic respiratory failure in the setting of acute systolic heart failure.  Echocardiogram reveals an ejection fraction of 30%.  This is new to the patient.  His shortness of breath is improved.  He received dialysis for the fluid overload/pulmonary edema.  He was evaluated by cardiology.  Oxygen via nasal cannula has been discontinued.  His oxygen saturations remained over 92% on room air.  2.  Acute systolic heart failure with ejection fraction of 30% (new): Patient has CHF referral.  Patient will follow-up with cardiology.  Cardiology has recommended to start [facility].  Patient will also be on Coreg.   Patient had been on Norvasc and ACE inhibitor which have now been discontinued.  3.  End-stage renal disease on hemodialysis with pulmonary edema: Patient received dialysis on the day of admission: He will continue Monday, Wednesday Friday schedule.  4.  Essential hypertension: Patient will continue Entresto and Coreg.  Norvasc and  previous ACE inhibitor have been discontinued.  5.  Diabetes: Patient will continue on insulin and Victoza  DISCHARGE CONDITIONS AND DIET:  Stable Diabetic heart healthy diet renal diet  CONSULTS OBTAINED:  Treatment Team:  Dionisio David, MD  DRUG ALLERGIES:  No Known Allergies  DISCHARGE MEDICATIONS:   Allergies as of 05/29/2019   No Known Allergies     Medication List    STOP taking these medications   amLODipine 5 MG tablet Commonly known as: NORVASC   lisinopril 20 MG tablet Commonly known as: ZESTRIL     TAKE these medications   calcium acetate 667 MG capsule Commonly known as: PHOSLO Take 1,334-2,668 mg by mouth See admin instructions. Take 4 capsules (2668MG ) by mouth 3 times daily with meals and 2 capsules (1334MG ) by mouth 2 times daily with snacks   carvedilol 3.125 MG tablet Commonly known as: COREG Take 1 tablet (3.125 mg total) by mouth 2 (two) times daily with a meal.   citalopram 20 MG tablet Commonly known as: CELEXA Take 20 mg by mouth daily.   insulin aspart protamine- aspart (70-30) 100 UNIT/ML injection Commonly known as: NOVOLOG MIX 70/30 Inject 10-12 Units into the skin See admin instructions. Inject 12u under the skin every morning and 10u under the skin at night   liraglutide 18 MG/3ML Sopn Commonly known as: VICTOZA Inject 0.6 mg into the skin daily.   loperamide 2 MG capsule Commonly known as: IMODIUM Take 1 capsule (2 mg total) by mouth every 6 (six) hours as needed for diarrhea or loose stools.  omeprazole 20 MG tablet Commonly known as: PRILOSEC OTC Take 20 mg by mouth daily.   sacubitril-valsartan 24-26 MG Commonly known as: ENTRESTO Take 1 tablet by mouth 2 (two) times daily.   sevelamer carbonate 800 MG tablet Commonly known as: RENVELA Take 3,200 mg by mouth 3 (three) times daily with meals.   traMADol 50 MG tablet Commonly known as: Ultram Take 1 tablet (50 mg total) by mouth every 6 (six) hours as needed.          Today   CHIEF COMPLAINT:  Feeling better no SOB    VITAL SIGNS:  Blood pressure (!) 145/75, pulse 69, temperature 98.1 F (36.7 C), temperature source Oral, resp. rate 18, height 5\' 8"  (1.727 m), weight 94.5 kg, SpO2 100 %.   REVIEW OF SYSTEMS:  Review of Systems  Constitutional: Negative.  Negative for chills, fever and malaise/fatigue.  HENT: Negative.  Negative for ear discharge, ear pain, hearing loss, nosebleeds and sore throat.   Eyes: Negative.  Negative for blurred vision and pain.  Respiratory: Negative.  Negative for cough, hemoptysis, shortness of breath and wheezing.   Cardiovascular: Positive for leg swelling. Negative for chest pain and palpitations.  Gastrointestinal: Negative.  Negative for abdominal pain, blood in stool, diarrhea, nausea and vomiting.  Genitourinary: Negative.  Negative for dysuria.  Musculoskeletal: Negative.  Negative for back pain.  Skin: Negative.   Neurological: Negative for dizziness, tremors, speech change, focal weakness, seizures and headaches.  Endo/Heme/Allergies: Negative.  Does not bruise/bleed easily.  Psychiatric/Behavioral: Negative.  Negative for depression, hallucinations and suicidal ideas.     PHYSICAL EXAMINATION:  GENERAL:  63 y.o.-year-old patient lying in the bed with no acute distress.  NECK:  Supple, no jugular venous distention. No thyroid enlargement, no tenderness.  LUNGS: Normal breath sounds bilaterally, no wheezing, rales,rhonchi  No use of accessory muscles of respiration.  CARDIOVASCULAR: S1, S2 normal. No murmurs, rubs, or gallops.  ABDOMEN: Soft, non-tender, non-distended. Bowel sounds present. No organomegaly or mass.  EXTREMITIES: 1+ LEE NO cyanosis, or clubbing.  PSYCHIATRIC: The patient is alert and oriented x 3.  SKIN: No obvious rash, lesion, or ulcer.   DATA REVIEW:   CBC Recent Labs  Lab 05/28/19 0545  WBC 6.4  HGB 8.5*  HCT 26.8*  PLT 143*    Chemistries  Recent Labs  Lab  05/29/19 0628  NA 138  K 4.9  CL 96*  CO2 31  GLUCOSE 118*  BUN 35*  CREATININE 5.15*  CALCIUM 9.4    Cardiac Enzymes No results for input(s): TROPONINI in the last 168 hours.  Microbiology Results  @[email or web address] @  RADIOLOGY:  Dg Chest 2 View  Result Date: 05/29/2019 CLINICAL DATA:  CHF.  Nonsmoker. EXAM: CHEST - 2 VIEW COMPARISON:  one day prior FINDINGS: Midline trachea. Moderate cardiomegaly. Atherosclerosis in the transverse aorta. No pleural effusion or pneumothorax. Pulmonary interstitial prominence and thickening of right minor fissure remain.No overt congestive failure. No lobar consolidation. IMPRESSION: Similar cardiomegaly and mild pulmonary venous congestion. Electronically Signed   By: Abigail Miyamoto M.D.   On: 05/29/2019 09:29   Dg Chest 2 View  Result Date: 05/28/2019 CLINICAL DATA:  Shortness of breath over the last 3 weeks. EXAM: CHEST - 2 VIEW COMPARISON:  12/06/2018 FINDINGS: Chronic cardiomegaly. Chronic aortic atherosclerosis. Pulmonary venous hypertension. No frank pulmonary edema. Small amount of fluid in the fissures. No significant bone finding. IMPRESSION: Low-grade congestive heart failure. Cardiomegaly. Pulmonary venous hypertension. Fluid in the fissures. Electronically Signed   By:  Nelson Chimes M.D.   On: 05/28/2019 08:36      Allergies as of 05/29/2019   No Known Allergies     Medication List    STOP taking these medications   amLODipine 5 MG tablet Commonly known as: NORVASC   lisinopril 20 MG tablet Commonly known as: ZESTRIL     TAKE these medications   calcium acetate 667 MG capsule Commonly known as: PHOSLO Take 1,334-2,668 mg by mouth See admin instructions. Take 4 capsules (2668MG ) by mouth 3 times daily with meals and 2 capsules (1334MG ) by mouth 2 times daily with snacks   carvedilol 3.125 MG tablet Commonly known as: COREG Take 1 tablet (3.125 mg total) by mouth 2 (two) times daily with a meal.   citalopram 20 MG  tablet Commonly known as: CELEXA Take 20 mg by mouth daily.   insulin aspart protamine- aspart (70-30) 100 UNIT/ML injection Commonly known as: NOVOLOG MIX 70/30 Inject 10-12 Units into the skin See admin instructions. Inject 12u under the skin every morning and 10u under the skin at night   liraglutide 18 MG/3ML Sopn Commonly known as: VICTOZA Inject 0.6 mg into the skin daily.   loperamide 2 MG capsule Commonly known as: IMODIUM Take 1 capsule (2 mg total) by mouth every 6 (six) hours as needed for diarrhea or loose stools.   omeprazole 20 MG tablet Commonly known as: PRILOSEC OTC Take 20 mg by mouth daily.   sacubitril-valsartan 24-26 MG Commonly known as: ENTRESTO Take 1 tablet by mouth 2 (two) times daily.   sevelamer carbonate 800 MG tablet Commonly known as: RENVELA Take 3,200 mg by mouth 3 (three) times daily with meals.   traMADol 50 MG tablet Commonly known as: Ultram Take 1 tablet (50 mg total) by mouth every 6 (six) hours as needed.        Management plans discussed with the patient and he is in agreement. Stable for discharge home   Patient should follow up with dr Humphrey Rolls  CODE STATUS:     Code Status Orders  (From admission, onward)         Start     Ordered   05/28/19 1831  Full code  Continuous     05/28/19 1831        Code Status History    Date Active Date Inactive Code Status Order ID Comments User Context   07/27/2018 2151 07/29/2018 1953 Full Code [id number]  Henreitta Leber, MD Inpatient   09/22/2017 1005 09/23/2017 2224 Full Code [id number]  Hillary Bow, MD ED   10/02/2016 1203 10/04/2016 2107 Full Code [id number]  Epifanio Lesches, MD ED   05/13/2016 1954 05/14/2016 1823 Full Code [id number]  Gladstone Lighter, MD Inpatient   Advance Care Planning Activity    Advance Directive Documentation     Most Recent Value  Type of Advance Directive  Healthcare Power of Attorney, Living will  Pre-existing out of facility DNR order (yellow  form or pink MOST form)  -  "MOST" Form in Place?  -      TOTAL TIME TAKING CARE OF THIS PATIENT: 38 minutes.    Note: This dictation was prepared with Dragon dictation along with smaller phrase technology. Any transcriptional errors that result from this process are unintentional.  Bettey Costa M.D on 05/29/2019 at 9:58 AM  Between 7am to 6pm - Pager - [phone] After 6pm go to [email or web address] - password [email or web address] [place] [facility]  Office  [phone]  CC: Primary care  physician; Patient, No Pcp Per

## 2019-05-29 NOTE — Progress Notes (Signed)
SUBJECTIVE: Patient is feeling much better   Vitals:   05/28/19 1800 05/28/19 1838 05/29/19 0624 05/29/19 0843  BP: (!) 148/98 (!) 145/71 129/61 (!) 145/75  Pulse: 80 77 63 69  Resp: (!) 21 19 20 18   Temp: 97.7 F (36.5 C) 97.7 F (36.5 C) 98.1 F (36.7 C)   TempSrc: Oral Oral Oral   SpO2:  100% 94% 100%  Weight: 94.5 kg     Height:        Intake/Output Summary (Last 24 hours) at 05/29/2019 0949 Last data filed at 05/28/2019 1800 Gross per 24 hour  Intake -  Output 2000 ml  Net -2000 ml    LABS: Basic Metabolic Panel: Recent Labs    05/28/19 0545 05/29/19 0628  NA 137 138  K 4.5 4.9  CL 96* 96*  CO2 30 31  GLUCOSE 216* 118*  BUN 36* 35*  CREATININE 5.49* 5.15*  CALCIUM 9.5 9.4  PHOS  --  3.9   Liver Function Tests: Recent Labs    05/29/19 0628  ALBUMIN 3.6   No results for input(s): LIPASE, AMYLASE in the last 72 hours. CBC: Recent Labs    05/28/19 0545  WBC 6.4  HGB 8.5*  HCT 26.8*  MCV 102.3*  PLT 143*   Cardiac Enzymes: No results for input(s): CKTOTAL, CKMB, CKMBINDEX, TROPONINI in the last 72 hours. BNP: Invalid input(s): POCBNP D-Dimer: No results for input(s): DDIMER in the last 72 hours. Hemoglobin A1C: Recent Labs    05/28/19 1905  HGBA1C 6.0*   Fasting Lipid Panel: No results for input(s): CHOL, HDL, LDLCALC, TRIG, CHOLHDL, LDLDIRECT in the last 72 hours. Thyroid Function Tests: No results for input(s): TSH, T4TOTAL, T3FREE, THYROIDAB in the last 72 hours.  Invalid input(s): FREET3 Anemia Panel: No results for input(s): VITAMINB12, FOLATE, FERRITIN, TIBC, IRON, RETICCTPCT in the last 72 hours.   PHYSICAL EXAM General: Well developed, well nourished, in no acute distress HEENT:  Normocephalic and atramatic Neck:  No JVD.  Lungs: Clear bilaterally to auscultation and percussion. Heart: HRRR . Normal S1 and S2 without gallops or murmurs.  Abdomen: Bowel sounds are positive, abdomen soft and non-tender  Msk:  Back normal,  normal gait. Normal strength and tone for age. Extremities: No clubbing, cyanosis or edema.   Neuro: Alert and oriented X 3. Psych:  Good affect, responds appropriately  TELEMETRY: Sinus rhythm  ASSESSMENT AND PLAN: Congestive heart failure with severe LV dysfunction left ventricular ejection fraction 29 to 35% with dilated left ventricle.  We will stop the ACE inhibitor and start the patient on Entresto.  Continue carvedilol patient can be discharged later on today with follow-up in the office Thursday at 10:00.  Principal Problem:   CHF exacerbation ([place])    Dionisio David, MD, [facility] 05/29/2019 9:49 AM

## 2019-05-29 NOTE — Progress Notes (Signed)
[address] Murriel Hopper  MRN: [id number]  DOB/AGE: 12/31/1955 63 y.o.  Primary Care Physician:Patient, No Pcp Per  Admit date: 05/28/2019  Chief Complaint:  Chief Complaint  Patient presents with  . Shortness of Breath    S-Pt presented on  05/28/2019 with  Chief Complaint  Patient presents with  . Shortness of Breath  .    Pt says " I feel much better, I want to go home"  Meds . amLODipine  5 mg Oral Daily  . carvedilol  3.125 mg Oral BID WC  . Chlorhexidine Gluconate Cloth  6 each Topical Q0600  . citalopram  20 mg Oral Daily  . heparin  5,000 Units Subcutaneous Q8H  . insulin aspart  0-5 Units Subcutaneous QHS  . insulin aspart  0-9 Units Subcutaneous TID WC  . lisinopril  20 mg Oral Daily  . pantoprazole  40 mg Oral Daily  . sevelamer carbonate  3,200 mg Oral TID WC  . sodium chloride flush  3 mL Intravenous Q12H         [id number] from the symptoms mentioned above,there are no other symptoms referable to all systems reviewed.  Physical Exam: Vital signs in last 24 hours: Temp:  [97.7 F (36.5 C)-98.6 F (37 C)] 98.1 F (36.7 C) (08/30 0624) Pulse Rate:  [52-80] 63 (08/30 0624) Resp:  [12-22] 20 (08/30 0624) BP: (129-171)/(61-98) 129/61 (08/30 0624) SpO2:  [94 %-100 %] 94 % (08/30 0624) Weight:  [94.5 kg-96.9 kg] 94.5 kg (08/29 1800) Weight change: 2.5 kg    Intake/Output from previous day: 08/29 0701 - 08/30 0700 In: -  Out: 2000  No intake/output data recorded.   Physical Exam: General- pt is awake,alert, oriented to time place and person Resp- No acute REsp distress,  NO Rhonchi CVS- S1S2 regular in rate and rhythm GIT- BS+, soft, NT, ND EXT- NO LE Edema, Cyanosis Access- AVF  Lab Results: CBC Recent Labs    05/28/19 0545  WBC 6.4  HGB 8.5*  HCT 26.8*  PLT 143*    BMET Recent Labs    05/28/19 0545 05/29/19 0628  NA 137 138  K 4.5 4.9  CL 96* 96*  CO2 30 31  GLUCOSE 216* 118*  BUN 36* 35*  CREATININE 5.49* 5.15*  CALCIUM 9.5  9.4    MICRO Recent Results (from the past 240 hour(s))  SARS Coronavirus 2 [facility] order, Performed in [facility] hospital lab) Nasopharyngeal Nasopharyngeal Swab     Status: None   Collection Time: 05/28/19 10:22 AM   Specimen: Nasopharyngeal Swab  Result Value Ref Range Status   SARS Coronavirus 2 NEGATIVE NEGATIVE Final    Comment: (NOTE) If result is NEGATIVE SARS-CoV-2 target nucleic acids are NOT DETECTED. The SARS-CoV-2 RNA is generally detectable in upper and lower  respiratory specimens during the acute phase of infection. The lowest  concentration of SARS-CoV-2 viral copies this assay can detect is 250  copies / mL. A negative result does not preclude SARS-CoV-2 infection  and should not be used as the sole basis for treatment or other  patient management decisions.  A negative result may occur with  improper specimen collection / handling, submission of specimen other  than nasopharyngeal swab, presence of viral mutation(s) within the  areas targeted by this assay, and inadequate number of viral copies  (<250 copies / mL). A negative result must be combined with clinical  observations, patient history, and epidemiological information. If result is POSITIVE SARS-CoV-2 target nucleic acids are DETECTED.  The SARS-CoV-2 RNA is generally detectable in upper and lower  respiratory specimens dur ing the acute phase of infection.  Positive  results are indicative of active infection with SARS-CoV-2.  Clinical  correlation with patient history and other diagnostic information is  necessary to determine patient infection status.  Positive results do  not rule out bacterial infection or co-infection with other viruses. If result is PRESUMPTIVE POSTIVE SARS-CoV-2 nucleic acids MAY BE PRESENT.   A presumptive positive result was obtained on the submitted specimen  and confirmed on repeat testing.  While 2019 novel coronavirus  (SARS-CoV-2) nucleic acids may be present in the  submitted sample  additional confirmatory testing may be necessary for epidemiological  and / or clinical management purposes  to differentiate between  SARS-CoV-2 and other Sarbecovirus currently known to infect humans.  If clinically indicated additional testing with an alternate test  methodology [phone]) is advised. The SARS-CoV-2 RNA is generally  detectable in upper and lower respiratory sp ecimens during the acute  phase of infection. The expected result is Negative. Fact Sheet for Patients:  StrictlyIdeas.no Fact Sheet for Healthcare Providers: BankingDealers.co.za This test is not yet approved or cleared by the [place] FDA and has been authorized for detection and/or diagnosis of SARS-CoV-2 by FDA under an Emergency Use Authorization (EUA).  This EUA will remain in effect (meaning this test can be used) for the duration of the COVID-19 declaration under Section 564(b)(1) of the Act, 21 U.S.C. section 360bbb-3(b)(1), unless the authorization is terminated or revoked sooner. Performed at [facility], [address] [place], [place] [address]       Lab Results  Component Value Date   CALCIUM 9.4 05/29/2019   PHOS 3.9 05/29/2019               Impression: 1)Renal  ESRD on HD                Pt is on MWF schedule                Pt with hx of no adherence to tx                Pt was last dialyzed yesterday   2)HTN  Medication- On RAS blockers On Calcium Channel Blockers On Alpha and beta Blockers.  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 Calcium is  at goal.  5)HCHF-admitted ith fluid overload PMD following  6)Electrolytes  Normokalemic NOrmonatremic   7)Acid base Co2 at goal     Plan:  I educated pt at length to please stay away from salt and not to cut his tx short. Pt and spouse voiced  understanding. Will dialyze in am if still inpt     Simran Bomkamp S 05/29/2019, 7:35 AM

## 2019-05-30 ENCOUNTER — Ambulatory Visit: Admit: 2019-05-30 | Discharge: 2019-05-31

## 2019-05-31 LAB — HIV ANTIBODY (ROUTINE TESTING W REFLEX): HIV Screen 4th Generation wRfx: NONREACTIVE

## 2019-06-03 NOTE — Progress Notes (Deleted)
   Patient ID: Danny [name], male    DOB: 03-07-1956, 63 y.o.   MRN: [id number]  HPI  Danny [name] is a 63 y/o male with a history of  Echo report from 05/29/2019 reviewed and showed an EF of 35-40% along with an elevated PA pressure of 41.6 mmHg.   Admitted 05/28/2019 due to acute heart failure. Cardiology consult obtained. Was weaned off of oxygen. Discharged the next day.   He presents today for his initial visit with a chief complaint of    Review of Systems    Physical Exam    Assessment & Plan:  1: Chronic heart failure with reduced ejection fraction- - NYHA class - not a candidate for farxiga due to GFR <30 - BNP 05/28/2019 was 4023.0  2: HTN- - BP - BMP from 05/29/2019 reviewed and showed sodium 138, potassium 4.9, creatinine 5.15 and GFR 11  3: DM- - A1c 05/28/2019 was 6.0%  4: ESRD on dialysis- - received dialysis on M, W, F

## 2019-06-07 ENCOUNTER — Ambulatory Visit: Payer: Self-pay | Admitting: Family

## 2019-06-07 ENCOUNTER — Ambulatory Visit: Admit: 2019-06-07 | Discharge: 2019-06-08

## 2019-06-07 DIAGNOSIS — I5022 Chronic systolic (congestive) heart failure: Secondary | ICD-10-CM

## 2019-06-07 MED ORDER — SACUBITRIL 24 MG-VALSARTAN 26 MG TABLET
ORAL_TABLET | Freq: Two times a day (BID) | ORAL | 0 refills | 30 days | Status: CP
Start: 2019-06-07 — End: 2019-08-06
  Filled 2019-07-07: qty 60, 30d supply, fill #0

## 2019-06-29 ENCOUNTER — Ambulatory Visit: Admit: 2019-06-29 | Discharge: 2019-06-30

## 2019-07-06 MED ORDER — INSULIN ASPAR PROT-INSULIN ASPART 100 UNIT/ML (70-30) SUBCUTANEOUS PEN
1 refills | 0 days | Status: CP
Start: 2019-07-06 — End: 2019-07-07
  Filled 2019-07-07: qty 15, 100d supply, fill #0

## 2019-07-07 MED FILL — NOVOLOG MIX 70-30 FLEXPEN U-100 INSULIN 100 UNIT/ML SUBCUTANEOUS PEN: 100 days supply | Qty: 15 | Fill #0 | Status: AC

## 2019-07-07 MED FILL — ENTRESTO 24 MG-26 MG TABLET: 30 days supply | Qty: 60 | Fill #0 | Status: AC

## 2019-07-07 MED FILL — CARVEDILOL 6.25 MG TABLET: 90 days supply | Qty: 180 | Fill #2 | Status: AC

## 2019-07-07 MED FILL — GABAPENTIN 300 MG CAPSULE: ORAL | 30 days supply | Qty: 30 | Fill #0

## 2019-07-07 MED FILL — CARVEDILOL 6.25 MG TABLET: ORAL | 90 days supply | Qty: 180 | Fill #2

## 2019-07-07 MED FILL — CITALOPRAM 20 MG TABLET: 90 days supply | Qty: 90 | Fill #2

## 2019-07-07 MED FILL — GABAPENTIN 300 MG CAPSULE: 30 days supply | Qty: 30 | Fill #0 | Status: AC

## 2019-07-07 MED FILL — CITALOPRAM 20 MG TABLET: 90 days supply | Qty: 90 | Fill #2 | Status: AC

## 2019-07-08 MED ORDER — INSULIN ASPAR PROT-INSULIN ASPART 100 UNIT/ML (70-30) SUBCUTANEOUS PEN
Freq: Every day | SUBCUTANEOUS | 11 refills | 100.00000 days | Status: CP
Start: 2019-07-08 — End: 2020-07-07
  Filled 2019-07-11: qty 15, 100d supply, fill #0

## 2019-07-11 MED FILL — NOVOLOG MIX 70-30 FLEXPEN U-100 INSULIN 100 UNIT/ML SUBCUTANEOUS PEN: 100 days supply | Qty: 15 | Fill #0 | Status: AC

## 2019-07-30 ENCOUNTER — Ambulatory Visit: Admit: 2019-07-30 | Discharge: 2019-07-31

## 2019-08-01 ENCOUNTER — Encounter: Payer: Self-pay | Admitting: Emergency Medicine

## 2019-08-01 ENCOUNTER — Inpatient Hospital Stay
Admission: EM | Admit: 2019-08-01 | Discharge: 2019-08-04 | DRG: 371 | Disposition: A | Discharge status: Home / Self Care | Payer: Medicaid Other | Attending: Internal Medicine | Admitting: Internal Medicine

## 2019-08-01 ENCOUNTER — Emergency Department: Payer: Medicaid Other

## 2019-08-01 ENCOUNTER — Other Ambulatory Visit: Payer: Self-pay

## 2019-08-01 DIAGNOSIS — E872 Acidosis: Secondary | ICD-10-CM | POA: Diagnosis present

## 2019-08-01 DIAGNOSIS — Z794 Long term (current) use of insulin: Secondary | ICD-10-CM

## 2019-08-01 DIAGNOSIS — Z9115 Patient's noncompliance with renal dialysis: Secondary | ICD-10-CM | POA: Diagnosis not present

## 2019-08-01 DIAGNOSIS — N186 End stage renal disease: Secondary | ICD-10-CM | POA: Diagnosis present

## 2019-08-01 DIAGNOSIS — Z992 Dependence on renal dialysis: Secondary | ICD-10-CM | POA: Diagnosis not present

## 2019-08-01 DIAGNOSIS — Z79899 Other long term (current) drug therapy: Secondary | ICD-10-CM

## 2019-08-01 DIAGNOSIS — I132 Hypertensive heart and chronic kidney disease with heart failure and with stage 5 chronic kidney disease, or end stage renal disease: Secondary | ICD-10-CM | POA: Diagnosis present

## 2019-08-01 DIAGNOSIS — A0472 Enterocolitis due to Clostridium difficile, not specified as recurrent: Principal | ICD-10-CM | POA: Diagnosis present

## 2019-08-01 DIAGNOSIS — I10 Essential (primary) hypertension: Secondary | ICD-10-CM | POA: Diagnosis present

## 2019-08-01 DIAGNOSIS — E875 Hyperkalemia: Secondary | ICD-10-CM | POA: Diagnosis present

## 2019-08-01 DIAGNOSIS — N2581 Secondary hyperparathyroidism of renal origin: Secondary | ICD-10-CM | POA: Diagnosis present

## 2019-08-01 DIAGNOSIS — F329 Major depressive disorder, single episode, unspecified: Secondary | ICD-10-CM | POA: Diagnosis present

## 2019-08-01 DIAGNOSIS — Z20828 Contact with and (suspected) exposure to other viral communicable diseases: Secondary | ICD-10-CM | POA: Diagnosis present

## 2019-08-01 DIAGNOSIS — E869 Volume depletion, unspecified: Secondary | ICD-10-CM | POA: Diagnosis present

## 2019-08-01 DIAGNOSIS — E669 Obesity, unspecified: Secondary | ICD-10-CM | POA: Diagnosis present

## 2019-08-01 DIAGNOSIS — Z833 Family history of diabetes mellitus: Secondary | ICD-10-CM

## 2019-08-01 DIAGNOSIS — E8770 Fluid overload, unspecified: Secondary | ICD-10-CM | POA: Diagnosis present

## 2019-08-01 DIAGNOSIS — I5042 Chronic combined systolic (congestive) and diastolic (congestive) heart failure: Secondary | ICD-10-CM | POA: Diagnosis present

## 2019-08-01 DIAGNOSIS — R197 Diarrhea, unspecified: Secondary | ICD-10-CM | POA: Diagnosis present

## 2019-08-01 DIAGNOSIS — R9431 Abnormal electrocardiogram [ECG] [EKG]: Secondary | ICD-10-CM

## 2019-08-01 DIAGNOSIS — Z841 Family history of disorders of kidney and ureter: Secondary | ICD-10-CM | POA: Diagnosis not present

## 2019-08-01 DIAGNOSIS — R0600 Dyspnea, unspecified: Secondary | ICD-10-CM

## 2019-08-01 DIAGNOSIS — E8779 Other fluid overload: Secondary | ICD-10-CM

## 2019-08-01 DIAGNOSIS — D631 Anemia in chronic kidney disease: Secondary | ICD-10-CM | POA: Diagnosis present

## 2019-08-01 DIAGNOSIS — Z6832 Body mass index (BMI) 32.0-32.9, adult: Secondary | ICD-10-CM | POA: Diagnosis not present

## 2019-08-01 DIAGNOSIS — R0609 Other forms of dyspnea: Secondary | ICD-10-CM

## 2019-08-01 DIAGNOSIS — E1122 Type 2 diabetes mellitus with diabetic chronic kidney disease: Secondary | ICD-10-CM | POA: Diagnosis present

## 2019-08-01 DIAGNOSIS — M109 Gout, unspecified: Secondary | ICD-10-CM | POA: Diagnosis present

## 2019-08-01 DIAGNOSIS — I5043 Acute on chronic combined systolic (congestive) and diastolic (congestive) heart failure: Secondary | ICD-10-CM | POA: Diagnosis present

## 2019-08-01 LAB — PHOSPHORUS: Phosphorus: 7.1 mg/dL — ABNORMAL HIGH (ref 2.5–4.6)

## 2019-08-01 LAB — COMPREHENSIVE METABOLIC PANEL
ALT: 18 U/L (ref 0–44)
AST: 18 U/L (ref 15–41)
Albumin: 4.3 g/dL (ref 3.5–5.0)
Alkaline Phosphatase: 166 U/L — ABNORMAL HIGH (ref 38–126)
Anion gap: 22 — ABNORMAL HIGH (ref 5–15)
BUN: 130 mg/dL — ABNORMAL HIGH (ref 8–23)
CO2: 17 mmol/L — ABNORMAL LOW (ref 22–32)
Calcium: 9.3 mg/dL (ref 8.9–10.3)
Chloride: 98 mmol/L (ref 98–111)
Creatinine, Ser: 12.81 mg/dL — ABNORMAL HIGH (ref 0.61–1.24)
GFR calc Af Amer: 4 mL/min — ABNORMAL LOW (ref >60)
GFR calc non Af Amer: 4 mL/min — ABNORMAL LOW (ref >60)
Glucose, Bld: 111 mg/dL — ABNORMAL HIGH (ref 70–99)
Potassium: 7.4 mmol/L (ref 3.5–5.1)
Sodium: 137 mmol/L (ref 135–145)
Total Bilirubin: 1.2 mg/dL (ref 0.3–1.2)
Total Protein: 8.1 g/dL (ref 6.5–8.1)

## 2019-08-01 LAB — POTASSIUM: Potassium: 4.7 mmol/L (ref 3.5–5.1)

## 2019-08-01 LAB — CBC
HCT: 41.9 % (ref 39.0–52.0)
Hemoglobin: 13.3 g/dL (ref 13.0–17.0)
MCH: 30 pg (ref 26.0–34.0)
MCHC: 31.7 g/dL (ref 30.0–36.0)
MCV: 94.4 fL (ref 80.0–100.0)
Platelets: 154 10*3/uL (ref 150–400)
RBC: 4.44 MIL/uL (ref 4.22–5.81)
RDW: 14.9 % (ref 11.5–15.5)
WBC: 6.8 10*3/uL (ref 4.0–10.5)
nRBC: 0 % (ref 0.0–0.2)

## 2019-08-01 LAB — TROPONIN I (HIGH SENSITIVITY)
Troponin I (High Sensitivity): 118 ng/L (ref <18)
Troponin I (High Sensitivity): 139 ng/L (ref <18)

## 2019-08-01 LAB — SARS CORONAVIRUS 2 BY RT PCR (HOSPITAL ORDER, PERFORMED IN ~~LOC~~ HOSPITAL LAB): SARS Coronavirus 2: NEGATIVE

## 2019-08-01 LAB — PROTIME-INR
INR: 1.2 (ref 0.8–1.2)
Prothrombin Time: 15.5 seconds — ABNORMAL HIGH (ref 11.4–15.2)

## 2019-08-01 LAB — LIPASE, BLOOD: Lipase: 37 U/L (ref 11–51)

## 2019-08-01 LAB — GLUCOSE, CAPILLARY: Glucose-Capillary: 178 mg/dL — ABNORMAL HIGH (ref 70–99)

## 2019-08-01 MED ORDER — ONDANSETRON HCL 4 MG PO TABS
4.0000 mg | ORAL_TABLET | Freq: Four times a day (QID) | ORAL | Status: DC | PRN
  Filled 2019-08-01: qty 1

## 2019-08-01 MED ORDER — LOPERAMIDE HCL 2 MG PO CAPS
4.0000 mg | ORAL_CAPSULE | ORAL | Status: DC | PRN
  Filled 2019-08-01: qty 2

## 2019-08-01 MED ORDER — LOPERAMIDE HCL 2 MG PO CAPS
8.0000 mg | ORAL_CAPSULE | Freq: Once | ORAL | Status: AC
  Administered 2019-08-01: 8 mg via ORAL
  Filled 2019-08-01: qty 4

## 2019-08-01 MED ORDER — SODIUM CHLORIDE 0.9 % IV SOLN: 100.0000 mL | INTRAVENOUS | Status: DC | PRN

## 2019-08-01 MED ORDER — ENOXAPARIN SODIUM 30 MG/0.3ML ~~LOC~~ SOLN: 30.0000 mg | SUBCUTANEOUS | Status: DC

## 2019-08-01 MED ORDER — SODIUM ZIRCONIUM CYCLOSILICATE 10 G PO PACK
10.0000 g | PACK | ORAL | Status: AC
  Administered 2019-08-01: 10 g via ORAL
  Filled 2019-08-01: qty 1

## 2019-08-01 MED ORDER — CALCIUM GLUCONATE 10 % IV SOLN
1.0000 g | Freq: Once | INTRAVENOUS | Status: AC
  Administered 2019-08-01: 1 g via INTRAVENOUS
  Filled 2019-08-01: qty 10

## 2019-08-01 MED ORDER — PANTOPRAZOLE SODIUM 40 MG PO TBEC
40.0000 mg | DELAYED_RELEASE_TABLET | Freq: Every day | ORAL | Status: DC
  Administered 2019-08-02 – 2019-08-04 (×3): 40 mg via ORAL
  Filled 2019-08-01 (×3): qty 1

## 2019-08-01 MED ORDER — ALTEPLASE 2 MG IJ SOLR
2.0000 mg | Freq: Once | INTRAMUSCULAR | Status: DC | PRN
  Filled 2019-08-01: qty 2

## 2019-08-01 MED ORDER — ACETAMINOPHEN 650 MG RE SUPP
650.0000 mg | Freq: Four times a day (QID) | RECTAL | Status: DC | PRN
  Filled 2019-08-01: qty 1

## 2019-08-01 MED ORDER — CITALOPRAM HYDROBROMIDE 20 MG PO TABS
20.0000 mg | ORAL_TABLET | Freq: Every day | ORAL | Status: DC
  Administered 2019-08-02 – 2019-08-04 (×3): 20 mg via ORAL
  Filled 2019-08-01 (×4): qty 1

## 2019-08-01 MED ORDER — ONDANSETRON HCL 4 MG/2ML IJ SOLN: 4.0000 mg | Freq: Four times a day (QID) | INTRAMUSCULAR | Status: DC | PRN

## 2019-08-01 MED ORDER — SODIUM BICARBONATE 8.4 % IV SOLN
50.0000 meq | Freq: Once | INTRAVENOUS | Status: AC
  Administered 2019-08-01: 50 meq via INTRAVENOUS
  Filled 2019-08-01: qty 50

## 2019-08-01 MED ORDER — INSULIN ASPART 100 UNIT/ML ~~LOC~~ SOLN
0.0000 [IU] | Freq: Three times a day (TID) | SUBCUTANEOUS | Status: DC
  Administered 2019-08-03 – 2019-08-04 (×2): 1 [IU] via SUBCUTANEOUS
  Filled 2019-08-01 (×2): qty 1

## 2019-08-01 MED ORDER — SODIUM CHLORIDE 0.9 % IV BOLUS
500.0000 mL | Freq: Once | INTRAVENOUS | Status: AC
  Administered 2019-08-01: 500 mL via INTRAVENOUS

## 2019-08-01 MED ORDER — ACETAMINOPHEN 325 MG PO TABS: 650.0000 mg | ORAL_TABLET | Freq: Four times a day (QID) | ORAL | Status: DC | PRN

## 2019-08-01 MED ORDER — INSULIN ASPART 100 UNIT/ML ~~LOC~~ SOLN
10.0000 [IU] | Freq: Once | SUBCUTANEOUS | Status: AC
  Administered 2019-08-01: 10 [IU] via INTRAVENOUS
  Filled 2019-08-01: qty 1

## 2019-08-01 MED ORDER — CHLORHEXIDINE GLUCONATE CLOTH 2 % EX PADS
6.0000 | MEDICATED_PAD | Freq: Every day | CUTANEOUS | Status: DC
  Administered 2019-08-02: 6 via TOPICAL
  Filled 2019-08-01 (×2): qty 6

## 2019-08-01 MED ORDER — DEXTROSE 50 % IV SOLN
50.0000 g | Freq: Once | INTRAVENOUS | Status: AC
  Administered 2019-08-01: 50 g via INTRAVENOUS
  Filled 2019-08-01: qty 50

## 2019-08-01 MED ORDER — INSULIN ASPART 100 UNIT/ML ~~LOC~~ SOLN: 0.0000 [IU] | Freq: Every day | SUBCUTANEOUS | Status: DC

## 2019-08-01 MED ORDER — OMEPRAZOLE MAGNESIUM 20 MG PO TBEC: 20.0000 mg | DELAYED_RELEASE_TABLET | Freq: Every day | ORAL | Status: DC

## 2019-08-01 MED ORDER — GABAPENTIN 300 MG PO CAPS
300.0000 mg | ORAL_CAPSULE | Freq: Every day | ORAL | Status: DC
  Administered 2019-08-02 – 2019-08-04 (×3): 300 mg via ORAL
  Filled 2019-08-01 (×4): qty 1

## 2019-08-01 MED ORDER — LIDOCAINE-PRILOCAINE 2.5-2.5 % EX CREA
1.0000 "application " | TOPICAL_CREAM | CUTANEOUS | Status: DC | PRN
  Filled 2019-08-01: qty 5

## 2019-08-01 MED ORDER — PENTAFLUOROPROP-TETRAFLUOROETH EX AERO
1.0000 "application " | INHALATION_SPRAY | CUTANEOUS | Status: DC | PRN
  Filled 2019-08-01: qty 30

## 2019-08-01 MED ORDER — HEPARIN SODIUM (PORCINE) 5000 UNIT/ML IJ SOLN
5000.0000 [IU] | Freq: Three times a day (TID) | INTRAMUSCULAR | Status: DC
  Administered 2019-08-01 – 2019-08-04 (×9): 5000 [IU] via SUBCUTANEOUS
  Filled 2019-08-01 (×11): qty 1

## 2019-08-01 MED ORDER — LIDOCAINE HCL (PF) 1 % IJ SOLN
5.0000 mL | INTRAMUSCULAR | Status: DC | PRN
  Filled 2019-08-01: qty 5

## 2019-08-01 MED ORDER — MORPHINE SULFATE (PF) 4 MG/ML IV SOLN
4.0000 mg | Freq: Once | INTRAVENOUS | Status: DC
  Filled 2019-08-01: qty 1

## 2019-08-01 MED ORDER — HEPARIN SODIUM (PORCINE) 1000 UNIT/ML DIALYSIS
1000.0000 [IU] | INTRAMUSCULAR | Status: DC | PRN
  Filled 2019-08-01: qty 1

## 2019-08-01 MED ORDER — DICYCLOMINE HCL 10 MG/ML IM SOLN
20.0000 mg | Freq: Once | INTRAMUSCULAR | Status: AC
  Administered 2019-08-01: 20 mg via INTRAMUSCULAR
  Filled 2019-08-01: qty 2

## 2019-08-01 MED ORDER — SODIUM CHLORIDE 0.9% FLUSH
3.0000 mL | Freq: Two times a day (BID) | INTRAVENOUS | Status: DC
  Administered 2019-08-02 – 2019-08-04 (×5): 3 mL via INTRAVENOUS

## 2019-08-01 NOTE — Progress Notes (Signed)
HD Initiated:    08/01/19 1637  Vital Signs  Temp 98.4 F (36.9 C)  Temp Source Oral  Pulse Rate Source Monitor  Resp 18  BP (!) 169/84  BP Location Right Arm  BP Method Automatic  Patient Position (if appropriate) Lying  During Hemodialysis Assessment  Blood Flow Rate (mL/min) 400 mL/min  Arterial Pressure (mmHg) -120 mmHg  Venous Pressure (mmHg) 260 mmHg  Transmembrane Pressure (mmHg) 60 mmHg  Ultrafiltration Rate (mL/min) 830 mL/min  Dialysate Flow Rate (mL/min) 800 ml/min  Conductivity: Machine  14  HD Safety Checks Performed Yes  Dialysis Fluid Bolus Normal Saline  Bolus Amount (mL) 250 mL  Intra-Hemodialysis Comments Tx initiated

## 2019-08-01 NOTE — ED Triage Notes (Signed)
Per interpreter, pt reports diarrhea for 2 weeks and is now weak. Pt states he feels like it is hard to walk now. Pt reports he is on dialysis a week but has missed 2 appointments because of diarrhea.

## 2019-08-01 NOTE — ED Notes (Signed)
Patient being dialyses. Awaiting bed status.

## 2019-08-01 NOTE — Progress Notes (Signed)
Post HD:    08/01/19 1905  Vital Signs  Temp 98.6 F (37 C)  Temp Source Oral  Pulse Rate 75  Pulse Rate Source Monitor  Resp 13  BP (!) 182/80  BP Location Right Arm  BP Method Automatic  Patient Position (if appropriate) Sitting  Post-Hemodialysis Assessment  Rinseback Volume (mL) 250 mL  KECN 56 V  Dialyzer Clearance Lightly streaked  Duration of HD Treatment -hour(s) 3 hour(s)  Hemodialysis Intake (mL) 500 mL  UF Total -Machine (mL) 2127 mL  Net UF (mL) 1627 mL  Tolerated HD Treatment Yes  Post-Hemodialysis Comments    AVG/AVF Arterial Site Held (minutes)  (20)  AVG/AVF Venous Site Held (minutes) 10 minutes

## 2019-08-01 NOTE — ED Notes (Signed)
Dinner tray provided. Patient being dialyzed, tolerating well. Labs drawn and sent bu dialyzes tech.

## 2019-08-01 NOTE — ED Notes (Signed)
Patient reports diarrhea has subsided at this time. Patient stating to MD that he is ok with going downstairs for dialysis treatment.

## 2019-08-01 NOTE — Progress Notes (Signed)
Post HD Assessment: .   08/01/19 1629  Neurological  Level of Consciousness Alert  Orientation Level Oriented X4  Respiratory  Respiratory Pattern Regular;Unlabored  Chest Assessment Chest expansion symmetrical  Bilateral Breath Sounds Clear  Vascular  R Radial Pulse +2  L Radial Pulse +2  Integumentary  Integumentary (WDL) X  Musculoskeletal  Musculoskeletal (WDL) WDL  Gastrointestinal  Last BM Date 08/01/19  GU Assessment  Genitourinary (WDL) X  Psychosocial  Psychosocial (WDL) WDL

## 2019-08-01 NOTE — ED Notes (Signed)
Pt situated in bed, call light in reach, bed low. No further needs expressed per pt. Will continue to monitor.

## 2019-08-01 NOTE — Progress Notes (Signed)
Pre HD:    08/01/19 1630  Vital Signs  Temp 98.4 F (36.9 C)  Temp Source Oral  Pulse Rate 74  Pulse Rate Source Monitor  Resp 15  BP (!) 169/84  BP Location Right Arm  BP Method Automatic  Patient Position (if appropriate) Lying  Pain Assessment  Pain Scale 0-10  Pain Score 0  Time-Out for Hemodialysis  What Procedure? HD  Pt Identifiers(min of two) First/Last Name;MRN/Account#;Pt's DOB(use if MRN/Acct# not available  Correct Site? Yes  Correct Side? Yes  Correct Procedure? Yes  Consents Verified? Yes  Rad Studies Available? N/A  Safety Precautions Reviewed? Yes  Engineer, civil (consulting) Number 1  Station Number  (ED 17)  UF/Alarm Test Passed  Conductivity: Meter 14  Conductivity: Machine  14  pH 7.2  Reverse Osmosis 3  Normal Saline Lot Number [id number]  Dialyzer Lot Number [id number]  Disposable Set Lot Number [id number]  Machine Temperature 98.6 F (37 C)  Musician and Audible Yes  Blood Lines Intact and Secured Yes  Pre Treatment Patient Checks  Vascular access used during treatment Graft  HD catheter dressing before treatment  (n/a )  Patient is receiving dialysis in a chair  (no)  Hepatitis B Surface Antigen Results Pending  Isolation Initiated Yes  Hepatitis B Surface Antibody  (unkown)  Date Hepatitis B Surface Antibody Drawn 08/01/19  Hemodialysis Consent Verified Yes  Hemodialysis Standing Orders Initiated Yes  ECG (Telemetry) Monitor On Yes  Prime Ordered Normal Saline  Length of  DialysisTreatment -hour(s) 3 Hour(s)  Dialyzer Elisio 17H NR  Dialysate 1K;2.5 Ca  Dialysate Flow Ordered 800  Blood Flow Rate Ordered 400 mL/min  Ultrafiltration Goal 830 Liters  Dialysis Blood Pressure Support Ordered Normal Saline  During Hemodialysis Assessment  Blood Flow Rate (mL/min) 400 mL/min  Arterial Pressure (mmHg) -110 mmHg  Venous Pressure (mmHg) 250 mmHg  Transmembrane Pressure (mmHg) 70 mmHg  Ultrafiltration Rate (mL/min) 830 mL/min  Dialysate  Flow Rate (mL/min) 800 ml/min  Conductivity: Machine  13.9  HD Safety Checks Performed Yes  Intra-Hemodialysis Comments Progressing as prescribed  Education / Care Plan  Dialysis Education Provided Yes  Documented Education in Care Plan Yes  Outpatient Plan of Care Reviewed and on Chart Yes  Fistula / Graft Left Upper arm Arteriovenous fistula  No Placement Date or Time found.   Orientation: Left  Access Location: Upper arm  Access Type: Arteriovenous fistula  Site Condition No complications  Fistula / Graft Assessment Bruit;Thrill;Present  Status Accessed  Needle Size 15  Drainage Description None

## 2019-08-01 NOTE — ED Notes (Signed)
Patient transported to X-ray 

## 2019-08-01 NOTE — Progress Notes (Signed)
HD Completed:   08/01/19 1905  Vital Signs  Temp 98.6 F (37 C)  Temp Source Oral  Pulse Rate 75  Pulse Rate Source Monitor  Resp 13  BP (!) 182/80  BP Location Right Arm  BP Method Automatic  Patient Position (if appropriate) Sitting  During Hemodialysis Assessment  KECN 56 KECN  Dialysis Fluid Bolus Normal Saline  Bolus Amount (mL) 250 mL  Intra-Hemodialysis Comments Tx completed;Tolerated well  Post-Hemodialysis Assessment  Rinseback Volume (mL) 250 mL  KECN 56 V  Dialyzer Clearance Lightly streaked  Duration of HD Treatment -hour(s) 3 hour(s)  Hemodialysis Intake (mL) 500 mL  UF Total -Machine (mL) 2127 mL  Net UF (mL) 1627 mL  Tolerated HD Treatment Yes  Post-Hemodialysis Comments    AVG/AVF Arterial Site Held (minutes)  (20)  AVG/AVF Venous Site Held (minutes) 10 minutes

## 2019-08-01 NOTE — ED Provider Notes (Signed)
[facility] Emergency Department Provider Note  ____________________________________________  Time seen: Approximately 8:51 AM  I have reviewed the triage vital signs and the nursing notes.   HISTORY  Chief Complaint Diarrhea and Weakness  Encounter completed with Spanish interpreter at bedside  HPI Danny [name] is a 63 y.o. male with a history of diabetes, hypertension, gout, end-stage renal disease on hemodialysis (Monday Wednesday Friday) who comes to the ED complaining of diarrhea for the past 2 weeks which is watery and black, progressively worsening generalized weakness and fatigue as well as shortness of breath.  He states he has not been to dialysis in 1 week.  In the last few days he has developed orthopnea, dyspnea on exertion which are new for him.  No palpitations chest pain or syncope, but he does get lightheaded when he stands up.  No alleviating factors.  Denies any pain.      Past Medical History:  Diagnosis Date  . Diabetes mellitus without complication ([place])   . Dialysis patient [facility])    tuesday, thursday and saturday  . Gout   . Hypertension   . Kidney disease      Patient Active Problem List   Diagnosis Date Noted  . CHF exacerbation ([place]) 05/28/2019  . Acute diarrhea 07/27/2018  . Viral gastroenteritis 10/04/2016  . Anemia 10/04/2016  . Thrombocytopenia ([place]) 10/04/2016  . Rectal bleeding 10/04/2016  . Hyperkalemia 10/02/2016  . Pulmonary edema 05/13/2016     Past Surgical History:  Procedure Laterality Date  . AV FISTULA PLACEMENT    . miscellaneous     peritoneal dialysis catheter placement and removal     Prior to Admission medications   Medication Sig Start Date End Date Taking? Authorizing Provider  calcium acetate (PHOSLO) 667 MG capsule Take 1,334-2,668 mg by mouth See admin instructions. Take 4 capsules (2668MG ) by mouth 3 times daily with meals and 2 capsules (1334MG ) by mouth 2 times daily with snacks     [provider]  carvedilol (COREG) 3.125 MG tablet Take 1 tablet (3.125 mg total) by mouth 2 (two) times daily with a meal. 05/29/19   Bettey Costa, MD  citalopram (CELEXA) 20 MG tablet Take 20 mg by mouth daily.     [provider]  insulin aspart protamine- aspart (NOVOLOG MIX 70/30) (70-30) 100 UNIT/ML injection Inject 10-12 Units into the skin See admin instructions. Inject 12u under the skin every morning and 10u under the skin at night    [provider]  liraglutide (VICTOZA) 18 MG/3ML SOPN Inject 0.6 mg into the skin daily.    [provider]  loperamide (IMODIUM) 2 MG capsule Take 1 capsule (2 mg total) by mouth every 6 (six) hours as needed for diarrhea or loose stools. 07/29/18   Henreitta Leber, MD  omeprazole (PRILOSEC OTC) 20 MG tablet Take 20 mg by mouth daily.    [provider]  sacubitril-valsartan (ENTRESTO) 24-26 MG Take 1 tablet by mouth 2 (two) times daily. 05/29/19   Bettey Costa, MD  sevelamer carbonate (RENVELA) 800 MG tablet Take 3,200 mg by mouth 3 (three) times daily with meals.    [provider]  traMADol (ULTRAM) 50 MG tablet Take 1 tablet (50 mg total) by mouth every 6 (six) hours as needed. 12/06/18 12/06/19  Nance Pear, MD     Allergies Patient has no known allergies.   Family History  Problem Relation Age of Onset  . Diabetes Mother   . Prostate  cancer Father   . Kidney disease Sister     Social History Social History   Tobacco Use  . Smoking status: Never Smoker  . Smokeless tobacco: Never Used  Substance Use Topics  . Alcohol use: No  . Drug use: No    Review of Systems  Constitutional:   No fever or chills.  ENT:   No sore throat. No rhinorrhea. Cardiovascular:   No chest pain or syncope. Respiratory:   Positive shortness of breath on exertion as above without cough. Gastrointestinal:   Negative for abdominal pain or vomiting.  Positive diarrhea. Musculoskeletal:   Negative for  focal pain or swelling All other systems reviewed and are negative except as documented above in ROS and HPI.  ____________________________________________   PHYSICAL EXAM:  VITAL SIGNS: ED Triage Vitals  Enc Vitals Group     BP 08/01/19 0702 (!) 184/96     Pulse Rate 08/01/19 0702 85     Resp 08/01/19 0707 20     Temp 08/01/19 0702 97.8 F (36.6 C)     Temp Source 08/01/19 0702 Oral     SpO2 08/01/19 0702 97 %     Weight 08/01/19 0702 205 lb 0.4 oz (93 kg)     Height 08/01/19 0702 5\' 8"  (1.727 m)     Head Circumference --      Peak Flow --      Pain Score 08/01/19 0707 0     Pain Loc --      Pain Edu? --      Excl. in [place]? --     Vital signs reviewed, nursing assessments reviewed.   Constitutional:   Alert and oriented. Non-toxic appearance. Eyes:   Conjunctivae are normal. EOMI. PERRL. ENT      Head:   Normocephalic and atraumatic.      Nose:   Wearing a mask.      Mouth/Throat:   Wearing a mask.      Neck:   No meningismus. Full ROM. Hematological/Lymphatic/Immunilogical:   No cervical lymphadenopathy. Cardiovascular:   RRR. Symmetric bilateral radial and DP pulses.  No murmurs. Cap refill less than 2 seconds.  Left upper extremity AV fistula is pulsatile without thrill, very prominent. Respiratory:   Normal respiratory effort without tachypnea/retractions. Breath sounds are clear and equal bilaterally. No wheezes/rales/rhonchi. Gastrointestinal:   Soft and nontender. Non distended. There is no CVA tenderness.  No rebound, rigidity, or guarding.  Rectal exam performed with nurse at bedside, thin brown stool, Hemoccult positive  Musculoskeletal:   Normal range of motion in all extremities. No joint effusions.  No lower extremity tenderness.  No edema. Neurologic:   Normal speech and language.  Motor grossly intact. No acute focal neurologic deficits are appreciated.  Skin:    Skin is warm, dry and intact. No rash noted.  No petechiae, purpura, or  bullae.  ____________________________________________    LABS (pertinent positives/negatives) (all labs ordered are listed, but only abnormal results are displayed) Labs Reviewed  COMPREHENSIVE METABOLIC PANEL - Abnormal; Notable for the following components:      Result Value   Potassium 7.4 (*)    CO2 17 (*)    Glucose, Bld 111 (*)    BUN 130 (*)    Creatinine, Ser 12.81 (*)    Alkaline Phosphatase 166 (*)    GFR calc non Af Amer 4 (*)    GFR calc Af Amer 4 (*)    Anion gap 22 (*)    All other  components within normal limits  SARS CORONAVIRUS 2 BY RT PCR (HOSPITAL ORDER, [place] [facility])  LIPASE, BLOOD  CBC  PROTIME-INR  TROPONIN I (HIGH SENSITIVITY)   ____________________________________________   EKG  Interpreted by me Sinus rhythm, rate of 81.  Right axis.  Left bundle branch block.  First-degree AV block.  No acute ischemic changes.  Previous EKG on May 30, 2019 and earlier showed evidence of LVH with repolarization abnormality and associated first-degree AV block, but current EKG represents a change in axis and newly evolved left bundle branch block.  ____________________________________________    RADIOLOGY  Dg Chest Portable 1 View  Result Date: 08/01/2019 CLINICAL DATA:  Short of breath EXAM: PORTABLE CHEST 1 VIEW COMPARISON:  May 29, 2019 FINDINGS: There is cardiomegaly with pulmonary venous hypertension. There is mild interstitial edema. There is mild atelectatic change in the left mid lung. There is no consolidation. No adenopathy. There is aortic atherosclerosis. No bone lesions. IMPRESSION: Cardiomegaly with pulmonary vascular congestion. Mild interstitial edema. Mild atelectasis left mid lung. No consolidation or pleural effusion. Aortic Atherosclerosis (ICD10-I70.0). Electronically Signed   By: Lowella Grip III M.D.   On: 08/01/2019 08:51    ____________________________________________   PROCEDURES .Critical  Care Performed by: Carrie Mew, MD Authorized by: Carrie Mew, MD   Critical care provider statement:    Critical care time (minutes):  33   Critical care time was exclusive of:  Separately billable procedures and treating other patients   Critical care was necessary to treat or prevent imminent or life-threatening deterioration of the following conditions:  Circulatory failure and metabolic crisis   Critical care was time spent personally by me on the following activities:  Development of treatment plan with patient or surrogate, discussions with consultants, evaluation of patient's response to treatment, examination of patient, obtaining history from patient or surrogate, ordering and performing treatments and interventions, ordering and review of laboratory studies, ordering and review of radiographic studies, pulse oximetry, re-evaluation of patient's condition and review of old charts    ____________________________________________  DIFFERENTIAL DIAGNOSIS   Hyperkalemia, hyponatremia, dehydration, pulmonary edema, congestive heart failure, non-STEMI, viral syndrome, GI bleed with symptomatic anemia  CLINICAL IMPRESSION / ASSESSMENT AND PLAN / ED COURSE  Medications ordered in the ED: Medications  insulin aspart (novoLOG) injection 10 Units (has no administration in time range)  dextrose 50 % solution 50 g (has no administration in time range)  calcium gluconate inj 10% (1 g) URGENT USE ONLY! (has no administration in time range)  sodium zirconium cyclosilicate (LOKELMA) packet 10 g (has no administration in time range)  sodium chloride 0.9 % bolus 500 mL (500 mLs Intravenous New Bag/Given 08/01/19 0834)  calcium gluconate inj 10% (1 g) URGENT USE ONLY! (1 g Intravenous Given 08/01/19 0840)  sodium bicarbonate injection 50 mEq (50 mEq Intravenous Given 08/01/19 0834)    Pertinent labs & imaging results that were available during my care of the patient were reviewed by me  and considered in my medical decision making (see chart for details).  Shaki Ladewig Murriel Hopper was evaluated in Emergency Department on 08/01/2019 for the symptoms described in the history of present illness. He was evaluated in the context of the global COVID-19 pandemic, which necessitated consideration that the patient might be at risk for infection with the SARS-CoV-2 virus that causes COVID-19. Institutional protocols and algorithms that pertain to the evaluation of patients at risk for COVID-19 are in a state of rapid change based on information  released by regulatory bodies including the CDC and federal and state organizations. These policies and algorithms were followed during the patient's care in the ED.     Clinical Course as of Jul 31 941  [facility] Aug 01, 2019  0823 Pt p/w diarrhea for [redacted] week along with volume overload / CHF sx. Has significant ekg changes from previous, concerning for e- abnormalities such as hyperkalemia. Will give calcium gluconate and sodium bicarb for cardiac stability while waiting for labs.    [PS]    Clinical Course User Index [PS] Carrie Mew, MD    ----------------------------------------- 9:41 AM on 08/01/2019 -----------------------------------------  Potassium is 7.4, BUN is 130.  I will give an additional dose of calcium gluconate IV as well as 10 units IV insulin and D50 to decrease serum potassium concentration.  Discussed with nephrology Dr. Zollie Scale who also recommends oral Lokelma at this time.  Covid screening ordered.  Hospitalist consult placed.   ____________________________________________   FINAL CLINICAL IMPRESSION(S) / ED DIAGNOSES    Final diagnoses:  End-stage renal disease on hemodialysis ([place])  DOE (dyspnea on exertion)  Hyperkalemia  EKG abnormality     ED Discharge Orders    None      Portions of this note were generated with dragon dictation software. Dictation errors may occur despite best attempts at proofreading.    Carrie Mew, MD 08/01/19 [phone]

## 2019-08-01 NOTE — Progress Notes (Signed)
[facility]  ROUNDING NOTE   Subjective:  Patient well-known to [place] from prior admission in August 2020. Presents now with diarrhea, weakness, and having missed dialysis since last Tuesday. He normally dialyzes on Monday, Wednesday, Friday. He states that he did have dialysis last Tuesday. He has significant hyperkalemia now. Interview today was conducted with the aid of an interpreter. Patient was requesting dialysis to be performed in a one-to-one patient to nurse ratio today. This was explained in depth that it would be difficult to accommodate this request today as we have 9 patients to undergo dialysis today. Patient stated that he may leave [place] if he is unable to have dialysis on a one-to-one ratio as he is embarrassed to have diarrhea in front of other patients. We also explained to the patient that privacy could be provided in our inpatient dialysis unit as there are curtains between each patient.   Objective:  Vital signs in last 24 hours:  Temp:  [97.8 F (36.6 C)] 97.8 F (36.6 C) (11/02 0707) Pulse Rate:  [74-85] 85 (11/02 1030) Resp:  [12-31] 31 (11/02 1030) BP: (161-184)/(87-99) 177/99 (11/02 0900) SpO2:  [96 %-98 %] 98 % (11/02 1030) Weight:  [93 kg-96 kg] 96 kg (11/02 0708)  Weight change:  Filed Weights   08/01/19 0702 08/01/19 0708  Weight: 93 kg 96 kg    Intake/Output: No intake/output data recorded.   Intake/Output this shift:  Total I/O In: 500 [IV Piggyback:500] Out: -   Physical Exam: General: No acute distress  Head: Normocephalic, atraumatic. Moist oral mucosal membranes  Eyes: Anicteric  Neck: Supple, trachea midline  Lungs:  Clear to auscultation, normal effort  Heart: S1S2 no rubs  Abdomen:  Soft, distention noted, bowel sounds present  Extremities: No peripheral edema.  Neurologic: Awake, alert, following commands  Skin: No lesions  Access: Aneurysmal left upper extremity AV fistula    Basic Metabolic  Panel: Recent Labs  Lab 08/01/19 0810  NA 137  K 7.4*  CL 98  CO2 17*  GLUCOSE 111*  BUN 130*  CREATININE 12.81*  CALCIUM 9.3    Liver Function Tests: Recent Labs  Lab 08/01/19 0810  AST 18  ALT 18  ALKPHOS 166*  BILITOT 1.2  PROT 8.1  ALBUMIN 4.3   Recent Labs  Lab 08/01/19 0810  LIPASE 37   No results for input(s): AMMONIA in the last 168 hours.  CBC: Recent Labs  Lab 08/01/19 0810  WBC 6.8  HGB 13.3  HCT 41.9  MCV 94.4  PLT 154    Cardiac Enzymes: No results for input(s): CKTOTAL, CKMB, CKMBINDEX, TROPONINI in the last 168 hours.  BNP: Invalid input(s): POCBNP  CBG: No results for input(s): GLUCAP in the last 168 hours.  Microbiology: Results for orders placed or performed during the hospital encounter of 08/01/19  SARS Coronavirus 2 by RT PCR (hospital order, performed in [facility] hospital lab) Nasopharyngeal Nasopharyngeal Swab     Status: None   Collection Time: 08/01/19 10:26 AM   Specimen: Nasopharyngeal Swab  Result Value Ref Range Status   SARS Coronavirus 2 NEGATIVE NEGATIVE Final    Comment: (NOTE) If result is NEGATIVE SARS-CoV-2 target nucleic acids are NOT DETECTED. The SARS-CoV-2 RNA is generally detectable in upper and lower  respiratory specimens during the acute phase of infection. The lowest  concentration of SARS-CoV-2 viral copies this assay can detect is 250  copies / mL. A negative result does not preclude SARS-CoV-2 infection  and  should not be used as the sole basis for treatment or other  patient management decisions.  A negative result may occur with  improper specimen collection / handling, submission of specimen other  than nasopharyngeal swab, presence of viral mutation(s) within the  areas targeted by this assay, and inadequate number of viral copies  (<250 copies / mL). A negative result must be combined with clinical  observations, patient history, and epidemiological information. If result is  POSITIVE SARS-CoV-2 target nucleic acids are DETECTED. The SARS-CoV-2 RNA is generally detectable in upper and lower  respiratory specimens dur ing the acute phase of infection.  Positive  results are indicative of active infection with SARS-CoV-2.  Clinical  correlation with patient history and other diagnostic information is  necessary to determine patient infection status.  Positive results do  not rule out bacterial infection or co-infection with other viruses. If result is PRESUMPTIVE POSTIVE SARS-CoV-2 nucleic acids MAY BE PRESENT.   A presumptive positive result was obtained on the submitted specimen  and confirmed on repeat testing.  While 2019 novel coronavirus  (SARS-CoV-2) nucleic acids may be present in the submitted sample  additional confirmatory testing may be necessary for epidemiological  and / or clinical management purposes  to differentiate between  SARS-CoV-2 and other Sarbecovirus currently known to infect humans.  If clinically indicated additional testing with an alternate test  methodology [phone]) is advised. The SARS-CoV-2 RNA is generally  detectable in upper and lower respiratory sp ecimens during the acute  phase of infection. The expected result is Negative. Fact Sheet for Patients:  StrictlyIdeas.no Fact Sheet for Healthcare Providers: BankingDealers.co.za This test is not yet approved or cleared by the [place] FDA and has been authorized for detection and/or diagnosis of SARS-CoV-2 by FDA under an Emergency Use Authorization (EUA).  This EUA will remain in effect (meaning this test can be used) for the duration of the COVID-19 declaration under Section 564(b)(1) of the Act, 21 U.S.C. section 360bbb-3(b)(1), unless the authorization is terminated or revoked sooner. Performed at [facility], [address] [place], [place] [address]     Coagulation Studies: Recent Labs     08/01/19 0810  LABPROT 15.5*  INR 1.2    Urinalysis: No results for input(s): COLORURINE, LABSPEC, PHURINE, GLUCOSEU, HGBUR, BILIRUBINUR, KETONESUR, PROTEINUR, UROBILINOGEN, NITRITE, LEUKOCYTESUR in the last 72 hours.  Invalid input(s): APPERANCEUR    Imaging: Dg Chest Portable 1 View  Result Date: 08/01/2019 CLINICAL DATA:  Short of breath EXAM: PORTABLE CHEST 1 VIEW COMPARISON:  May 29, 2019 FINDINGS: There is cardiomegaly with pulmonary venous hypertension. There is mild interstitial edema. There is mild atelectatic change in the left mid lung. There is no consolidation. No adenopathy. There is aortic atherosclerosis. No bone lesions. IMPRESSION: Cardiomegaly with pulmonary vascular congestion. Mild interstitial edema. Mild atelectasis left mid lung. No consolidation or pleural effusion. Aortic Atherosclerosis (ICD10-I70.0). Electronically Signed   By: Lowella Grip III M.D.   On: 08/01/2019 08:51     Medications:    . Chlorhexidine Gluconate Cloth  6 each Topical Q0600  . citalopram  20 mg Oral Daily  . gabapentin  300 mg Oral Daily  .  morphine injection  4 mg Intravenous Once  . pantoprazole  40 mg Oral Daily     Assessment/ Plan:  63 y.o. male with end stage renal disease on hemodialysis, insulin dependent diabetes mellitus type 2, gout, hypertension, anemia of chronic kidney disease, secondary hyperparathyroidism,depression, admitted now with diarrhea and having missed  dialysis x2 sessions.  MWF [facility] Left AVF  1.  ESRD on HD MWF/hyperkalemia.  Patient has missed last 2 outpatient dialysis treatment secondary to ongoing diarrhea.  Patient today requests that we be able to perform dialysis in a one-to-one nurse to patient ratio.  We advised the patient that this request will be difficult to accommodate given nursing staffing today and high patient volume.  Patient states that he may leave [place] if this request could not be  honored.  We did advise the patient that privacy could be provided in the inpatient dialysis unit.  Patient considering this a bit further.  2.  Secondary hyperparathyroidism.  We plan to check serum phosphorus as well as PTH during dialysis treatment today.  3.  Diarrhea.  Further work-up as per hospitalist.  LOS: 0 Evamaria Detore 11/2/20202:23 PM

## 2019-08-01 NOTE — Progress Notes (Signed)
Pre HD Assessment:   08/01/19 1629  Neurological  Level of Consciousness Alert  Orientation Level Oriented X4  Respiratory  Respiratory Pattern Regular;Unlabored  Chest Assessment Chest expansion symmetrical  Bilateral Breath Sounds Clear  Vascular  R Radial Pulse +2  L Radial Pulse +2  Integumentary  Integumentary (WDL) X  Musculoskeletal  Musculoskeletal (WDL) WDL  Gastrointestinal  Last BM Date 08/01/19  GU Assessment  Genitourinary (WDL) X  Psychosocial  Psychosocial (WDL) WDL

## 2019-08-01 NOTE — ED Notes (Signed)
Assumed care of patient. Patient awaiting dialysis. vss denies pain or discomforts.

## 2019-08-01 NOTE — ED Notes (Signed)
Critical lab received Dr. Adaline Sill aware of trop of 139. Patient currently being dialyzed.

## 2019-08-01 NOTE — H&P (Addendum)
History and Physical  Danny [name] [id number] DOB: [birth year] DOA: 08/01/2019  Referring physician: Jeanella Cara, ER physician PCP: Patient, No Pcp Per  Outpatient Specialists: None Patient coming from: Home & is able to ambulate without assistance  Chief Complaint: Diarrhea  HPI: Danny [name] is a 63 y.o. male with medical history significant for diabetes mellitus, hypertension and end-stage renal disease who approximately 2 weeks ago started having continuous diarrhea.  Diarrhea is described as no associated abdominal pain no improvement when he quit eating, no associated nausea but just loose watery liquid bowel movements occurring quite frequently at least 7-10 times a day.  No one else at home sick.  Patient denies any exposure to Covid.  Patient felt so weak and embarrassed after going to dialysis and having continuous diarrhea there, that he stopped going as of his last session being Tuesday, 10/27.  Patient decided come the ER today because he felt very weak like he could barely walk.  ED Course: In the emergency room, patient's labs consistent with end-stage renal disease and missing dialysis with a potassium of 7.4, creatinine of 12.81 with a BUN of 130 and the rest of his labs really unremarkable.  High-sensitivity troponin at 118.  Patient was given multiple doses of calcium carbonate to protect his heart.  EKG was unrevealing.  He also received 1 dose of potassium binding medication.  Hospitalist called,  nephrology was consulted, but patient however refused to go to dialysis because of his concerns for his severe diarrhea.  When explained to him that a work-up from GI would only happen once his renal function was stabilized, patient was quite adamant that he would rather go home and understanding that he would die if this happened.  We were able to come to a compromise when patient received large doses of Bentyl and Imodium which seems to have stopped his diarrhea in the  short-term interval.  Patient then relented to dialysis.  GI in the meantime has been consulted.  Covid test was negative.  Review of Systems: Patient seen in the emergency room Pt complains of severe diarrhea.  He feels like he does not have much of an appetite, he feels tired.  Pt denies any headaches, vision changes, dysphagia, chest pain, palpitations, shortness of breath, wheeze, cough, abdominal pain, hematuria, dysuria, constipation, focal extremity numbness weakness or pain, nausea or vomiting.  Review of systems are otherwise negative   Past Medical History:  Diagnosis Date   Diabetes mellitus without complication [facility])    Dialysis patient ([place])    tuesday, thursday and saturday   Gout    Hypertension    Kidney disease    Past Surgical History:  Procedure Laterality Date   AV FISTULA PLACEMENT     miscellaneous     peritoneal dialysis catheter placement and removal    Social History:  reports that he has never smoked. He has never used smokeless tobacco. He reports that he does not drink alcohol or use drugs.  Lives at home with his wife and child.  Ambulates without assistance normally.   No Known Allergies  Family History  Problem Relation Age of Onset   Diabetes Mother    Prostate cancer Father    Kidney disease Sister       Prior to Admission medications   Medication Sig Start Date End Date Taking? Authorizing Provider  albuterol (VENTOLIN HFA) 108 (90 Base) MCG/ACT inhaler Inhale 2 puffs into the lungs every 6 (six) hours as  needed for wheezing. 07/19/18  Yes [provider]  amLODipine (NORVASC) 5 MG tablet Take 5 mg by mouth at bedtime. 11/05/18  Yes [provider]  calcium acetate (PHOSLO) 667 MG capsule Take 1,334-2,668 mg by mouth See admin instructions. Take 4 capsules (2668MG ) by mouth 3 times daily with meals and 2 capsules (1334MG ) by mouth 2 times daily with snacks   Yes [provider]  carvedilol (COREG) 3.125 MG  tablet Take 1 tablet (3.125 mg total) by mouth 2 (two) times daily with a meal. 05/29/19  Yes Mody, Sital, MD  citalopram (CELEXA) 20 MG tablet Take 20 mg by mouth daily.    Yes [provider]  gabapentin (NEURONTIN) 300 MG capsule Take 300 mg by mouth daily. 04/08/19  Yes [provider]  insulin aspart protamine- aspart (NOVOLOG MIX 70/30) (70-30) 100 UNIT/ML injection Inject 10-12 Units into the skin See admin instructions. Inject 12u under the skin every morning and 10u under the skin at night   Yes [provider]  liraglutide (VICTOZA) 18 MG/3ML SOPN Inject 0.6 mg into the skin daily.   Yes [provider]  omeprazole (PRILOSEC OTC) 20 MG tablet Take 20 mg by mouth daily.   Yes [provider]  sacubitril-valsartan (ENTRESTO) 24-26 MG Take 1 tablet by mouth 2 (two) times daily. 05/29/19  Yes Bettey Costa, MD  sevelamer carbonate (RENVELA) 800 MG tablet Take 3,200 mg by mouth 3 (three) times daily with meals.   Yes [provider]  loperamide (IMODIUM) 2 MG capsule Take 1 capsule (2 mg total) by mouth every 6 (six) hours as needed for diarrhea or loose stools. 07/29/18   Henreitta Leber, MD    Physical Exam: BP (!) 177/99    Pulse 85    Temp 97.8 F (36.6 C) (Oral)    Resp (!) 31    Ht 5\' 8"  (1.727 m)    Wt 96 kg    SpO2 98%    BMI 32.18 kg/m   General: Alert and oriented x3, no acute distress Eyes: Sclera nonicteric, extraocular movements are intact ENT: Normocephalic and atraumatic, mucous membrane slightly dry Neck: Supple, no JVD Cardiovascular: Regular rate and rhythm, S1-S2 Respiratory: Clear to auscultation bilaterally Abdomen: Soft, nontender, nondistended, positive bowel sounds Skin: No skin breaks, tears or lesions Musculoskeletal: No clubbing or cyanosis or edema Psychiatric: Appropriate, no evidence of psychoses Neurologic: No focal deficits          Labs on Admission:  Basic Metabolic Panel: Recent Labs  Lab  08/01/19 0810  NA 137  K 7.4*  CL 98  CO2 17*  GLUCOSE 111*  BUN 130*  CREATININE 12.81*  CALCIUM 9.3   Liver Function Tests: Recent Labs  Lab 08/01/19 0810  AST 18  ALT 18  ALKPHOS 166*  BILITOT 1.2  PROT 8.1  ALBUMIN 4.3   Recent Labs  Lab 08/01/19 0810  LIPASE 37   No results for input(s): AMMONIA in the last 168 hours. CBC: Recent Labs  Lab 08/01/19 0810  WBC 6.8  HGB 13.3  HCT 41.9  MCV 94.4  PLT 154   Cardiac Enzymes: No results for input(s): CKTOTAL, CKMB, CKMBINDEX, TROPONINI in the last 168 hours.  BNP (last 3 results) Recent Labs    05/28/19 0545  BNP 4,023.0*    ProBNP (last 3 results) No results for input(s): PROBNP in the last 8760 hours.  CBG: No results for input(s): GLUCAP in the last 168 hours.  Radiological Exams  on Admission: Dg Chest Portable 1 View  Result Date: 08/01/2019 CLINICAL DATA:  Short of breath EXAM: PORTABLE CHEST 1 VIEW COMPARISON:  May 29, 2019 FINDINGS: There is cardiomegaly with pulmonary venous hypertension. There is mild interstitial edema. There is mild atelectatic change in the left mid lung. There is no consolidation. No adenopathy. There is aortic atherosclerosis. No bone lesions. IMPRESSION: Cardiomegaly with pulmonary vascular congestion. Mild interstitial edema. Mild atelectasis left mid lung. No consolidation or pleural effusion. Aortic Atherosclerosis (ICD10-I70.0). Electronically Signed   By: Lowella Grip III M.D.   On: 08/01/2019 08:51    EKG: Independently reviewed.  Sinus rhythm with first-degree AV block and left bundle branch block.  Compared to an EKG a year ago, patient previously had an incomplete left bundle branch block  Assessment/Plan Present on Admission:  Hyperkalemia: Secondary to missing dialysis.  Patient is.  Multiple doses of calcium carbonate as well as a dose of Lokelma.  Best managed with dialysis.   Acute diarrhea: Severe.  Started 2 weeks ago and patient states that he  has had anywhere from 7-10 loose stool bowel movements every day.  Does not see any blood.  Unclear etiology.  Will check stool cultures 7 C.Diff and have consulted GI-Dr.Tahini who will see pt.   Diabetes mellitus with ESRD (end-stage renal disease) ([place]): Sliding scale.  Nephrology following.  Dialysis.   Hypertension: Continue home medications.   Chronic combined systolic and diastolic CHF (congestive heart failure) ([place]): Ejection fraction of 35-40%.  Diastolic dysfunction.  Echocardiogram done 05/29/2019.  Manage volumes with dialysis.   Obesity: Patient meets criteria with BMI greater than 30.   Principal Problem:   Volume overload Active Problems:   Hyperkalemia   Acute diarrhea   Diabetes mellitus with ESRD (end-stage renal disease) ([facility])   Hypertension   Chronic combined systolic and diastolic CHF (congestive heart failure) ([facility])   Diarrhea   DVT prophylaxis: Lovenox  Code Status: Full code  Family Communication: Left message for wife  Disposition Plan: We will likely need several days of dialysis and then evaluation by GI  Consults called: Nephrology-Lateef for dialysis Gastroenterology-Tahiliani paged  Admission status: Given need for acute hospital services and will be here past 2 midnights, admit as inpatient    Annita Brod MD [facility] Pager [phone]  If 7PM-7AM, please contact night-coverage [email or web address] Password [facility]  08/01/2019, 3:41 PM

## 2019-08-02 ENCOUNTER — Inpatient Hospital Stay: Payer: Medicaid Other

## 2019-08-02 DIAGNOSIS — A0472 Enterocolitis due to Clostridium difficile, not specified as recurrent: Principal | ICD-10-CM

## 2019-08-02 DIAGNOSIS — E1122 Type 2 diabetes mellitus with diabetic chronic kidney disease: Secondary | ICD-10-CM

## 2019-08-02 DIAGNOSIS — N186 End stage renal disease: Secondary | ICD-10-CM

## 2019-08-02 LAB — COMPREHENSIVE METABOLIC PANEL
ALT: 15 U/L (ref 0–44)
AST: 17 U/L (ref 15–41)
Albumin: 3.4 g/dL — ABNORMAL LOW (ref 3.5–5.0)
Alkaline Phosphatase: 125 U/L (ref 38–126)
Anion gap: 15 (ref 5–15)
BUN: 80 mg/dL — ABNORMAL HIGH (ref 8–23)
CO2: 28 mmol/L (ref 22–32)
Calcium: 8.7 mg/dL — ABNORMAL LOW (ref 8.9–10.3)
Chloride: 96 mmol/L — ABNORMAL LOW (ref 98–111)
Creatinine, Ser: 9.35 mg/dL — ABNORMAL HIGH (ref 0.61–1.24)
GFR calc Af Amer: 6 mL/min — ABNORMAL LOW (ref >60)
GFR calc non Af Amer: 5 mL/min — ABNORMAL LOW (ref >60)
Glucose, Bld: 108 mg/dL — ABNORMAL HIGH (ref 70–99)
Potassium: 5.8 mmol/L — ABNORMAL HIGH (ref 3.5–5.1)
Sodium: 139 mmol/L (ref 135–145)
Total Bilirubin: 1 mg/dL (ref 0.3–1.2)
Total Protein: 6.8 g/dL (ref 6.5–8.1)

## 2019-08-02 LAB — CBC
HCT: 32.8 % — ABNORMAL LOW (ref 39.0–52.0)
Hemoglobin: 10.7 g/dL — ABNORMAL LOW (ref 13.0–17.0)
MCH: 30 pg (ref 26.0–34.0)
MCHC: 32.6 g/dL (ref 30.0–36.0)
MCV: 91.9 fL (ref 80.0–100.0)
Platelets: 107 10*3/uL — ABNORMAL LOW (ref 150–400)
RBC: 3.57 MIL/uL — ABNORMAL LOW (ref 4.22–5.81)
RDW: 14.7 % (ref 11.5–15.5)
WBC: 4.2 10*3/uL (ref 4.0–10.5)
nRBC: 0 % (ref 0.0–0.2)

## 2019-08-02 LAB — HEPATITIS B SURFACE ANTIGEN: Hepatitis B Surface Ag: NONREACTIVE

## 2019-08-02 LAB — GLUCOSE, CAPILLARY
Glucose-Capillary: 155 mg/dL — ABNORMAL HIGH (ref 70–99)
Glucose-Capillary: 75 mg/dL (ref 70–99)
Glucose-Capillary: 93 mg/dL (ref 70–99)
Glucose-Capillary: 115 mg/dL — ABNORMAL HIGH (ref 70–99)
Glucose-Capillary: 157 mg/dL — ABNORMAL HIGH (ref 70–99)

## 2019-08-02 LAB — C DIFFICILE QUICK SCREEN W PCR REFLEX
C Diff antigen: POSITIVE — AB
C Diff interpretation: DETECTED
C Diff toxin: POSITIVE — AB

## 2019-08-02 LAB — MRSA PCR SCREENING: MRSA by PCR: NEGATIVE

## 2019-08-02 MED ORDER — SEVELAMER CARBONATE 800 MG PO TABS
3200.0000 mg | ORAL_TABLET | Freq: Three times a day (TID) | ORAL | Status: DC
  Administered 2019-08-02 – 2019-08-04 (×4): 3200 mg via ORAL
  Filled 2019-08-02 (×4): qty 4

## 2019-08-02 MED ORDER — CARVEDILOL 3.125 MG PO TABS
3.1250 mg | ORAL_TABLET | Freq: Two times a day (BID) | ORAL | Status: DC
  Administered 2019-08-02 – 2019-08-04 (×2): 3.125 mg via ORAL
  Filled 2019-08-02 (×2): qty 1

## 2019-08-02 MED ORDER — SODIUM ZIRCONIUM CYCLOSILICATE 10 G PO PACK
10.0000 g | PACK | Freq: Every day | ORAL | Status: DC
  Administered 2019-08-02 – 2019-08-04 (×3): 10 g via ORAL
  Filled 2019-08-02 (×3): qty 1

## 2019-08-02 MED ORDER — VANCOMYCIN 50 MG/ML ORAL SOLUTION
125.0000 mg | Freq: Four times a day (QID) | ORAL | Status: DC
  Administered 2019-08-02 – 2019-08-04 (×7): 125 mg via ORAL
  Filled 2019-08-02 (×11): qty 2.5

## 2019-08-02 MED ORDER — CALCIUM ACETATE (PHOS BINDER) 667 MG PO CAPS
2668.0000 mg | ORAL_CAPSULE | Freq: Three times a day (TID) | ORAL | Status: DC
  Administered 2019-08-02 – 2019-08-04 (×4): 2668 mg via ORAL
  Filled 2019-08-02 (×7): qty 4

## 2019-08-02 MED ORDER — CALCIUM ACETATE (PHOS BINDER) 667 MG PO CAPS
1334.0000 mg | ORAL_CAPSULE | Freq: Two times a day (BID) | ORAL | Status: DC | PRN
  Filled 2019-08-02: qty 2

## 2019-08-02 MED ORDER — CHLORHEXIDINE GLUCONATE CLOTH 2 % EX PADS
6.0000 | MEDICATED_PAD | Freq: Every day | CUTANEOUS | Status: DC
  Administered 2019-08-03 – 2019-08-04 (×2): 6 via TOPICAL

## 2019-08-02 NOTE — Plan of Care (Signed)
No complaints of shortness of breath.  Reds clip reading >40 on admission.

## 2019-08-02 NOTE — Progress Notes (Signed)
Patient's wife came out to the nurse's station to talk to me about some concerns she has for the patient. She verbalized he has fallen multiple times and hit his head every time he has fallen. She is also concerned about increased weakness in the last couple of months especially in his legs, poor appetite and he has missed multiple dialysis days. Dr. Wyonia Hough was notified.

## 2019-08-02 NOTE — Progress Notes (Signed)
[facility]  ROUNDING NOTE   Subjective:  Patient did undergo hemodialysis yesterday. Tolerated well. Hyperkalemia improved with serum potassium down to 5.8.  Objective:  Vital signs in last 24 hours:  Temp:  [97.8 F (36.6 C)-98.6 F (37 C)] 98.1 F (36.7 C) (11/03 0800) Pulse Rate:  [66-82] 66 (11/03 0800) Resp:  [11-22] 19 (11/03 0800) BP: (155-189)/(69-98) 178/95 (11/03 0800) SpO2:  [96 %-99 %] 98 % (11/03 0800) Weight:  [101.4 kg] 101.4 kg (11/03 0157)  Weight change:  Filed Weights   08/01/19 0702 08/01/19 0708 08/02/19 0157  Weight: 93 kg 96 kg 101.4 kg    Intake/Output: I/O last 3 completed shifts: In: 500 [IV Piggyback:500] Out: [id number]   Intake/Output this shift:  Total I/O In: 3 [I.V.:3] Out: -   Physical Exam: General: No acute distress  Head: Normocephalic, atraumatic. Moist oral mucosal membranes  Eyes: Anicteric  Neck: Supple, trachea midline  Lungs:  Clear to auscultation, normal effort  Heart: S1S2 no rubs  Abdomen:  Soft, distention noted, bowel sounds present  Extremities: No peripheral edema.  Neurologic: Awake, alert, following commands  Skin: No lesions  Access: Aneurysmal left upper extremity AV fistula    Basic Metabolic Panel: Recent Labs  Lab 08/01/19 0810 08/01/19 1710 08/02/19 0609  NA 137  --  139  K 7.4* 4.7 5.8*  CL 98  --  96*  CO2 17*  --  28  GLUCOSE 111*  --  108*  BUN 130*  --  80*  CREATININE 12.81*  --  9.35*  CALCIUM 9.3  --  8.7*  PHOS  --  7.1*  --     Liver Function Tests: Recent Labs  Lab 08/01/19 0810 08/02/19 0609  AST 18 17  ALT 18 15  ALKPHOS 166* 125  BILITOT 1.2 1.0  PROT 8.1 6.8  ALBUMIN 4.3 3.4*   Recent Labs  Lab 08/01/19 0810  LIPASE 37   No results for input(s): AMMONIA in the last 168 hours.  CBC: Recent Labs  Lab 08/01/19 0810 08/02/19 0609  WBC 6.8 4.2  HGB 13.3 10.7*  HCT 41.9 32.8*  MCV 94.4 91.9  PLT 154 107*    Cardiac Enzymes: No  results for input(s): CKTOTAL, CKMB, CKMBINDEX, TROPONINI in the last 168 hours.  BNP: Invalid input(s): POCBNP  CBG: Recent Labs  Lab 08/01/19 2254 08/02/19 0159 08/02/19 0802 08/02/19 1219  GLUCAP 178* 155* 75 93    Microbiology: Results for orders placed or performed during the hospital encounter of 08/01/19  SARS Coronavirus 2 by RT PCR (hospital order, performed in [facility] hospital lab) Nasopharyngeal Nasopharyngeal Swab     Status: None   Collection Time: 08/01/19 10:26 AM   Specimen: Nasopharyngeal Swab  Result Value Ref Range Status   SARS Coronavirus 2 NEGATIVE NEGATIVE Final    Comment: (NOTE) If result is NEGATIVE SARS-CoV-2 target nucleic acids are NOT DETECTED. The SARS-CoV-2 RNA is generally detectable in upper and lower  respiratory specimens during the acute phase of infection. The lowest  concentration of SARS-CoV-2 viral copies this assay can detect is 250  copies / mL. A negative result does not preclude SARS-CoV-2 infection  and should not be used as the sole basis for treatment or other  patient management decisions.  A negative result may occur with  improper specimen collection / handling, submission of specimen other  than nasopharyngeal swab, presence of viral mutation(s) within the  areas targeted by this assay, and inadequate number  of viral copies  (<250 copies / mL). A negative result must be combined with clinical  observations, patient history, and epidemiological information. If result is POSITIVE SARS-CoV-2 target nucleic acids are DETECTED. The SARS-CoV-2 RNA is generally detectable in upper and lower  respiratory specimens dur ing the acute phase of infection.  Positive  results are indicative of active infection with SARS-CoV-2.  Clinical  correlation with patient history and other diagnostic information is  necessary to determine patient infection status.  Positive results do  not rule out bacterial infection or co-infection with  other viruses. If result is PRESUMPTIVE POSTIVE SARS-CoV-2 nucleic acids MAY BE PRESENT.   A presumptive positive result was obtained on the submitted specimen  and confirmed on repeat testing.  While 2019 novel coronavirus  (SARS-CoV-2) nucleic acids may be present in the submitted sample  additional confirmatory testing may be necessary for epidemiological  and / or clinical management purposes  to differentiate between  SARS-CoV-2 and other Sarbecovirus currently known to infect humans.  If clinically indicated additional testing with an alternate test  methodology [phone]) is advised. The SARS-CoV-2 RNA is generally  detectable in upper and lower respiratory sp ecimens during the acute  phase of infection. The expected result is Negative. Fact Sheet for Patients:  StrictlyIdeas.no Fact Sheet for Healthcare Providers: BankingDealers.co.za This test is not yet approved or cleared by the [place] FDA and has been authorized for detection and/or diagnosis of SARS-CoV-2 by FDA under an Emergency Use Authorization (EUA).  This EUA will remain in effect (meaning this test can be used) for the duration of the COVID-19 declaration under Section 564(b)(1) of the Act, 21 U.S.C. section 360bbb-3(b)(1), unless the authorization is terminated or revoked sooner. Performed at [facility], [address] [place], [place] [address]   MRSA PCR Screening     Status: None   Collection Time: 08/02/19 10:10 AM   Specimen: Nasopharyngeal  Result Value Ref Range Status   MRSA by PCR NEGATIVE NEGATIVE Final    Comment:        The GeneXpert MRSA Assay (FDA approved for NASAL specimens only), is one component of a comprehensive MRSA colonization surveillance program. It is not intended to diagnose MRSA infection nor to guide or monitor treatment for MRSA infections. Performed at [facility], [address]  [place], [place] [address]     Coagulation Studies: Recent Labs    08/01/19 0810  LABPROT 15.5*  INR 1.2    Urinalysis: No results for input(s): COLORURINE, LABSPEC, PHURINE, GLUCOSEU, HGBUR, BILIRUBINUR, KETONESUR, PROTEINUR, UROBILINOGEN, NITRITE, LEUKOCYTESUR in the last 72 hours.  Invalid input(s): APPERANCEUR    Imaging: Ct Head Wo Contrast  Result Date: 08/02/2019 CLINICAL DATA:  Headaches following soccer injury 1 week ago, initial encounter EXAM: CT HEAD WITHOUT CONTRAST TECHNIQUE: Contiguous axial images were obtained from the base of the skull through the vertex without intravenous contrast. COMPARISON:  12/06/2018 FINDINGS: Brain: Stable cerebellar atrophy is noted. Mild cerebral atrophy is seen as well. No findings to suggest acute hemorrhage, acute infarction or space-occupying mass lesion noted. Vascular: No hyperdense vessel or unexpected calcification. Skull: Normal. Negative for fracture or focal lesion. Sinuses/Orbits: No acute finding. Other: None IMPRESSION: Chronic changes without acute abnormality. Electronically Signed   By: Inez Catalina M.D.   On: 08/02/2019 13:48   Dg Chest Portable 1 View  Result Date: 08/01/2019 CLINICAL DATA:  Short of breath EXAM: PORTABLE CHEST 1 VIEW COMPARISON:  May 29, 2019 FINDINGS: There is  cardiomegaly with pulmonary venous hypertension. There is mild interstitial edema. There is mild atelectatic change in the left mid lung. There is no consolidation. No adenopathy. There is aortic atherosclerosis. No bone lesions. IMPRESSION: Cardiomegaly with pulmonary vascular congestion. Mild interstitial edema. Mild atelectasis left mid lung. No consolidation or pleural effusion. Aortic Atherosclerosis (ICD10-I70.0). Electronically Signed   By: Lowella Grip III M.D.   On: 08/01/2019 08:51     Medications:   . sodium chloride    . sodium chloride     . calcium acetate  2,668 mg Oral TID WC  . carvedilol  3.125 mg Oral BID WC  .  Chlorhexidine Gluconate Cloth  6 each Topical Q0600  . citalopram  20 mg Oral Daily  . gabapentin  300 mg Oral Daily  . heparin injection (subcutaneous)  5,000 Units Subcutaneous Q8H  . insulin aspart  0-5 Units Subcutaneous QHS  . insulin aspart  0-9 Units Subcutaneous TID WC  .  morphine injection  4 mg Intravenous Once  . pantoprazole  40 mg Oral Daily  . sevelamer carbonate  3,200 mg Oral TID WC  . sodium chloride flush  3 mL Intravenous Q12H  . sodium zirconium cyclosilicate  10 g Oral Daily     Assessment/ Plan:  63 y.o. male with end stage renal disease on hemodialysis, insulin dependent diabetes mellitus type 2, gout, hypertension, anemia of chronic kidney disease, secondary hyperparathyroidism,depression, admitted now with diarrhea and having missed dialysis x2 sessions.  MWF [facility] Left AVF  1.  ESRD on HD MWF/hyperkalemia.  Patient had missed  2 outpatient dialysis treatment secondary to  diarrhea.   -Patient did undergo hemodialysis yesterday.  Potassium partially improved down to 7.8.  Maintain the patient on sodium zirconium but we have reduced the dose to 10 g daily.  Next dialysis treatment tomorrow.  2.  Secondary hyperparathyroidism.  Phosphorus yesterday was 7.1.  We plan to repeat this tomorrow.  Maintain the patient on Renvela and calcium acetate at this time..  3.  Diarrhea.  Further work-up as per hospitalist.  4.  Anemia of chronic kidney disease.  Hemoglobin currently 10.7.  Hold off on Epogen at this time.   LOS: 1 Clio Gerhart 11/3/20202:26 PM

## 2019-08-02 NOTE — Progress Notes (Addendum)
Patient refusing bed alarm despite education on the importance of it to prevent falls. He stated that he has not fallen, the last time he fell was about a year ago playing soccer but according to wife he has fallen multiple times. Despite education he is still refusing bed alarm. Other staff members have mentioned he is steady on his feet while ambulating.   Update: MD notified of patient's cdiff positive results. Verbal orders put in.

## 2019-08-02 NOTE — Consult Note (Signed)
Danny Antigua, MD [address] [place], [place], [place], [address] [address], [place], [place], [place], [address] Phone: [phone]  Fax: [phone]  Consultation  Referring Provider:     Dr. Elray Mcgregor Primary Care Physician:  Patient, No Pcp Per Reason for Consultation: Diarrhea  Date of Admission:  08/01/2019 Date of Consultation:  08/02/2019         HPI:   Danny [name] is a 63 y.o. male with history of ESRD on hemodialysis, who started having diarrhea 2 weeks ago.  Not associated with abdominal pain.  No nausea or vomiting.  7-10 stools a day.  No sick contacts.  Covid negative.  Patient did not go to his dialysis sessions due to the diarrhea.  Came to the hospital due to weakness from the diarrhea.  Today reports only 1 BM that was more formed.   Past Medical History:  Diagnosis Date  . Diabetes mellitus without complication ([place])   . Dialysis patient [facility])    tuesday, thursday and saturday  . Gout   . Hypertension   . Kidney disease     Past Surgical History:  Procedure Laterality Date  . AV FISTULA PLACEMENT    . miscellaneous     peritoneal dialysis catheter placement and removal    Prior to Admission medications   Medication Sig Start Date End Date Taking? Authorizing Provider  albuterol (VENTOLIN HFA) 108 (90 Base) MCG/ACT inhaler Inhale 2 puffs into the lungs every 6 (six) hours as needed for wheezing. 07/19/18  Yes [provider]  amLODipine (NORVASC) 5 MG tablet Take 5 mg by mouth at bedtime. 11/05/18  Yes [provider]  calcium acetate (PHOSLO) 667 MG capsule Take 1,334-2,668 mg by mouth See admin instructions. Take 4 capsules (2668MG ) by mouth 3 times daily with meals and 2 capsules (1334MG ) by mouth 2 times daily with snacks   Yes [provider]  carvedilol (COREG) 3.125 MG tablet Take 1 tablet (3.125 mg total) by mouth 2 (two) times daily with a meal. 05/29/19  Yes Mody, Sital, MD  citalopram (CELEXA) 20  MG tablet Take 20 mg by mouth daily.    Yes [provider]  gabapentin (NEURONTIN) 300 MG capsule Take 300 mg by mouth daily. 04/08/19  Yes [provider]  insulin aspart protamine- aspart (NOVOLOG MIX 70/30) (70-30) 100 UNIT/ML injection Inject 10-12 Units into the skin See admin instructions. Inject 12u under the skin every morning and 10u under the skin at night   Yes [provider]  liraglutide (VICTOZA) 18 MG/3ML SOPN Inject 0.6 mg into the skin daily.   Yes [provider]  omeprazole (PRILOSEC OTC) 20 MG tablet Take 20 mg by mouth daily.   Yes [provider]  sacubitril-valsartan (ENTRESTO) 24-26 MG Take 1 tablet by mouth 2 (two) times daily. 05/29/19  Yes Bettey Costa, MD  sevelamer carbonate (RENVELA) 800 MG tablet Take 3,200 mg by mouth 3 (three) times daily with meals.   Yes [provider]  loperamide (IMODIUM) 2 MG capsule Take 1 capsule (2 mg total) by mouth every 6 (six) hours as needed for diarrhea or loose stools. 07/29/18   Henreitta Leber, MD    Family History  Problem Relation Age of Onset  . Diabetes Mother   . Prostate cancer Father   . Kidney disease Sister      Social History   Tobacco Use  . Smoking status: Never Smoker  . Smokeless tobacco: Never  Used  Substance Use Topics  . Alcohol use: No  . Drug use: No    Allergies as of 08/01/2019  . (No Known Allergies)    Review of Systems:    All systems reviewed and negative except where noted in HPI.   Physical Exam:  Vital signs in last 24 hours: Vitals:   08/01/19 2000 08/02/19 0058 08/02/19 0157 08/02/19 0800  BP: (!) 186/96 (!) 158/81 (!) 185/98 (!) 178/95  Pulse: 80 70 75 66  Resp: 15 14 20 19   Temp:   97.8 F (36.6 C) 98.1 F (36.7 C)  TempSrc:   Oral Oral  SpO2: 98% 99% 96% 98%  Weight:   101.4 kg   Height:       Last BM Date: 08/01/19 General:   Pleasant, cooperative in NAD Head:  Normocephalic and atraumatic. Eyes:   No icterus.    Conjunctiva pink. PERRLA. Ears:  Normal auditory acuity. Neck:  Supple; no masses or thyroidomegaly Lungs: Respirations even and unlabored. Lungs clear to auscultation bilaterally.   No wheezes, crackles, or rhonchi.  Abdomen:  Soft, nondistended, nontender. Normal bowel sounds. No appreciable masses or hepatomegaly.  No rebound or guarding.  Neurologic:  Alert and oriented x3;  grossly normal neurologically. Skin:  Intact without significant lesions or rashes. Cervical Nodes:  No significant cervical adenopathy. Psych:  Alert and cooperative. Normal affect.  LAB RESULTS: Recent Labs    08/01/19 0810 08/02/19 0609  WBC 6.8 4.2  HGB 13.3 10.7*  HCT 41.9 32.8*  PLT 154 107*   BMET Recent Labs    08/01/19 0810 08/01/19 1710 08/02/19 0609  NA 137  --  139  K 7.4* 4.7 5.8*  CL 98  --  96*  CO2 17*  --  28  GLUCOSE 111*  --  108*  BUN 130*  --  80*  CREATININE 12.81*  --  9.35*  CALCIUM 9.3  --  8.7*   LFT Recent Labs    08/02/19 0609  PROT 6.8  ALBUMIN 3.4*  AST 17  ALT 15  ALKPHOS 125  BILITOT 1.0   PT/INR Recent Labs    08/01/19 0810  LABPROT 15.5*  INR 1.2    STUDIES: Ct Head Wo Contrast  Result Date: 08/02/2019 CLINICAL DATA:  Headaches following soccer injury 1 week ago, initial encounter EXAM: CT HEAD WITHOUT CONTRAST TECHNIQUE: Contiguous axial images were obtained from the base of the skull through the vertex without intravenous contrast. COMPARISON:  12/06/2018 FINDINGS: Brain: Stable cerebellar atrophy is noted. Mild cerebral atrophy is seen as well. No findings to suggest acute hemorrhage, acute infarction or space-occupying mass lesion noted. Vascular: No hyperdense vessel or unexpected calcification. Skull: Normal. Negative for fracture or focal lesion. Sinuses/Orbits: No acute finding. Other: None IMPRESSION: Chronic changes without acute abnormality. Electronically Signed   By: Inez Catalina M.D.   On: 08/02/2019 13:48   Dg Chest Portable 1 View   Result Date: 08/01/2019 CLINICAL DATA:  Short of breath EXAM: PORTABLE CHEST 1 VIEW COMPARISON:  May 29, 2019 FINDINGS: There is cardiomegaly with pulmonary venous hypertension. There is mild interstitial edema. There is mild atelectatic change in the left mid lung. There is no consolidation. No adenopathy. There is aortic atherosclerosis. No bone lesions. IMPRESSION: Cardiomegaly with pulmonary vascular congestion. Mild interstitial edema. Mild atelectasis left mid lung. No consolidation or pleural effusion. Aortic Atherosclerosis (ICD10-I70.0). Electronically Signed   By: Lowella Grip III M.D.   On: 08/01/2019 08:51  Impression / Plan:   Danny [name] is a 63 y.o. y/o male with diarrhea  Patient C. difficile is positive and this is the cause of his diarrhea Would recommend oral vancomycin 125 mg 4 times a day  No signs of severe or fulminant disease Monitor for improvement on antibiotic course and if no improvement, change therapy as indicated  GI service will sign off at this time please page with any questions or concerns or if we can be of any further assistance  Thank you for involving me in the care of this patient.      LOS: 1 day   Danny Manifold, MD  08/02/2019, 3:29 PM

## 2019-08-02 NOTE — ED Notes (Signed)
Patient is resting on stretcher. Patient denies any discomfort at this time.

## 2019-08-02 NOTE — Progress Notes (Signed)
PROGRESS NOTE    Danny [name]  [id number] DOB: 16-Dec-1955 DOA: 08/01/2019 PCP: Patient, No Pcp Per   Brief Narrative:  This is a 63 year old Hispanic male with a past male history notable for diabetes, hypertension and end-stage renal disease on dialysis who reports approximately 2-week history of diarrhea prior to admission.  Patient was unable to complaints dialysis secondary to diarrhea presented to the ED because he felt profoundly weak with inability to walk. In the ER patient was found to have potassium of 7.4 with a creatinine of 12 and a BUN of 130.  He did emergently for his hyperkalemia and the case was discussed with both GI for the diarrhea as well as nephrology for emergent dialysis.  He was subsequently admitted for further eval and treatment after reluctantly agreeing to undergo treatment   Assessment & Plan:   Principal Problem:   Volume overload Active Problems:   Hyperkalemia   Acute diarrhea   Diabetes mellitus with ESRD (end-stage renal disease) ([place])   Hypertension   Chronic combined systolic and diastolic CHF (congestive heart failure) ([facility])   Diarrhea   Hyperkalemia: -Secondary to missing dialysis -Received emergency treatment with calcium and Lokelma -Treatment with dialysis -Recheck labs in the morning  Acute diarrhea -GI pathogen panel and C. difficile negative -GI to see the patient in consultation -Symptomatic treatment  End-stage renal disease -Nephrology consultation for dialysis -Electrolyte monitoring  Hypertension: -Continue home medications  Chronic combined systolic and diastolic heart failure: -Ejection fraction 35 to 40% -Volume management through dialysis     DVT prophylaxis: Lovenox SQ  Code Status: Full code    Code Status Orders  (From admission, onward)         Start     Ordered   08/01/19 1709  Full code  Continuous     08/01/19 1708        Code Status History    Date Active Date Inactive Code  Status Order ID Comments User Context   05/28/2019 1831 05/29/2019 1547 Full Code [id number]  Vaughan Basta, MD Inpatient   07/27/2018 2151 07/29/2018 1953 Full Code [id number]  Henreitta Leber, MD Inpatient   09/22/2017 1005 09/23/2017 2224 Full Code [id number]  Hillary Bow, MD ED   10/02/2016 1203 10/04/2016 2107 Full Code [id number]  Epifanio Lesches, MD ED   05/13/2016 1954 05/14/2016 1823 Full Code [id number]  Gladstone Lighter, MD Inpatient   Advance Care Planning Activity     Family Communication: called wife, NA  Disposition Plan:   Inpatient will likely need several days of consecutive dialysis, close electrolyte monitoring as well as subspecialty evaluation with GI.  Patient will need IV fluid resuscitation managed conservatively given dialysis with significant volume loss through 2 weeks of diarrhea. Consults called: None Admission status: Inpatient   Consultants:   Nephrology, GI  Procedures:  Dg Chest Portable 1 View  Result Date: 08/01/2019 CLINICAL DATA:  Short of breath EXAM: PORTABLE CHEST 1 VIEW COMPARISON:  May 29, 2019 FINDINGS: There is cardiomegaly with pulmonary venous hypertension. There is mild interstitial edema. There is mild atelectatic change in the left mid lung. There is no consolidation. No adenopathy. There is aortic atherosclerosis. No bone lesions. IMPRESSION: Cardiomegaly with pulmonary vascular congestion. Mild interstitial edema. Mild atelectasis left mid lung. No consolidation or pleural effusion. Aortic Atherosclerosis (ICD10-I70.0). Electronically Signed   By: Lowella Grip III M.D.   On: 08/01/2019 08:51     Antimicrobials:   None   Subjective: Patient stable this morning  no complaints   Objective: Vitals:   08/01/19 2000 08/02/19 0058 08/02/19 0157 08/02/19 0800  BP: (!) 186/96 (!) 158/81 (!) 185/98 (!) 178/95  Pulse: 80 70 75 66  Resp: 15 14 20 19   Temp:   97.8 F (36.6 C) 98.1 F (36.7 C)  TempSrc:   Oral Oral   SpO2: 98% 99% 96% 98%  Weight:   101.4 kg   Height:        Intake/Output Summary (Last 24 hours) at 08/02/2019 1347 Last data filed at 08/02/2019 1007 Gross per 24 hour  Intake 3 ml  Output 1627 ml  Net -1624 ml   Filed Weights   08/01/19 0702 08/01/19 0708 08/02/19 0157  Weight: 93 kg 96 kg 101.4 kg    Examination:  General exam: Appears calm and comfortable  Respiratory system: Clear to auscultation. Respiratory effort normal. Cardiovascular system: S1 & S2 heard, RRR. No JVD, murmurs, rubs, gallops or clicks. No pedal edema. Gastrointestinal system: Abdomen is nondistended, soft and nontender. No organomegaly or masses felt. Normal bowel sounds heard. Central nervous system: Alert and oriented. No focal neurological deficits. Extremities: Warm and well perfused, neurovascularly intact, trace bilateral lower extremity edema. Skin: No rashes, lesions or ulcers Psychiatry: Judgement and insight appear normal. Mood & affect appropriate.     Data Reviewed: I have personally reviewed following labs and imaging studies  CBC: Recent Labs  Lab 08/01/19 0810 08/02/19 0609  WBC 6.8 4.2  HGB 13.3 10.7*  HCT 41.9 32.8*  MCV 94.4 91.9  PLT 154 [id number]*   Basic Metabolic Panel: Recent Labs  Lab 08/01/19 0810 08/01/19 1710 08/02/19 0609  NA 137  --  139  K 7.4* 4.7 5.8*  CL 98  --  96*  CO2 17*  --  28  GLUCOSE 111*  --  108*  BUN 130*  --  80*  CREATININE 12.81*  --  9.35*  CALCIUM 9.3  --  8.7*  PHOS  --  7.1*  --    GFR: Estimated Creatinine Clearance: 9.3 mL/min (A) (by C-G formula based on SCr of 9.35 mg/dL (H)). Liver Function Tests: Recent Labs  Lab 08/01/19 0810 08/02/19 0609  AST 18 17  ALT 18 15  ALKPHOS 166* 125  BILITOT 1.2 1.0  PROT 8.1 6.8  ALBUMIN 4.3 3.4*   Recent Labs  Lab 08/01/19 0810  LIPASE 37   No results for input(s): AMMONIA in the last 168 hours. Coagulation Profile: Recent Labs  Lab 08/01/19 0810  INR 1.2   Cardiac Enzymes:  No results for input(s): CKTOTAL, CKMB, CKMBINDEX, TROPONINI in the last 168 hours. BNP (last 3 results) No results for input(s): PROBNP in the last 8760 hours. HbA1C: No results for input(s): HGBA1C in the last 72 hours. CBG: Recent Labs  Lab 08/01/19 2254 08/02/19 0159 08/02/19 0802 08/02/19 1219  GLUCAP 178* 155* 75 93   Lipid Profile: No results for input(s): CHOL, HDL, LDLCALC, TRIG, CHOLHDL, LDLDIRECT in the last 72 hours. Thyroid Function Tests: No results for input(s): TSH, T4TOTAL, FREET4, T3FREE, THYROIDAB in the last 72 hours. Anemia Panel: No results for input(s): VITAMINB12, FOLATE, FERRITIN, TIBC, IRON, RETICCTPCT in the last 72 hours. Sepsis Labs: No results for input(s): PROCALCITON, LATICACIDVEN in the last 168 hours.  Recent Results (from the past 240 hour(s))  SARS Coronavirus 2 by RT PCR (hospital order, performed in [facility] hospital lab) Nasopharyngeal Nasopharyngeal Swab     Status: None   Collection Time: 08/01/19 10:26 AM  Specimen: Nasopharyngeal Swab  Result Value Ref Range Status   SARS Coronavirus 2 NEGATIVE NEGATIVE Final    Comment: (NOTE) If result is NEGATIVE SARS-CoV-2 target nucleic acids are NOT DETECTED. The SARS-CoV-2 RNA is generally detectable in upper and lower  respiratory specimens during the acute phase of infection. The lowest  concentration of SARS-CoV-2 viral copies this assay can detect is 250  copies / mL. A negative result does not preclude SARS-CoV-2 infection  and should not be used as the sole basis for treatment or other  patient management decisions.  A negative result may occur with  improper specimen collection / handling, submission of specimen other  than nasopharyngeal swab, presence of viral mutation(s) within the  areas targeted by this assay, and inadequate number of viral copies  (<250 copies / mL). A negative result must be combined with clinical  observations, patient history, and epidemiological  information. If result is POSITIVE SARS-CoV-2 target nucleic acids are DETECTED. The SARS-CoV-2 RNA is generally detectable in upper and lower  respiratory specimens dur ing the acute phase of infection.  Positive  results are indicative of active infection with SARS-CoV-2.  Clinical  correlation with patient history and other diagnostic information is  necessary to determine patient infection status.  Positive results do  not rule out bacterial infection or co-infection with other viruses. If result is PRESUMPTIVE POSTIVE SARS-CoV-2 nucleic acids MAY BE PRESENT.   A presumptive positive result was obtained on the submitted specimen  and confirmed on repeat testing.  While 2019 novel coronavirus  (SARS-CoV-2) nucleic acids may be present in the submitted sample  additional confirmatory testing may be necessary for epidemiological  and / or clinical management purposes  to differentiate between  SARS-CoV-2 and other Sarbecovirus currently known to infect humans.  If clinically indicated additional testing with an alternate test  methodology [phone]) is advised. The SARS-CoV-2 RNA is generally  detectable in upper and lower respiratory sp ecimens during the acute  phase of infection. The expected result is Negative. Fact Sheet for Patients:  StrictlyIdeas.no Fact Sheet for Healthcare Providers: BankingDealers.co.za This test is not yet approved or cleared by the [place] FDA and has been authorized for detection and/or diagnosis of SARS-CoV-2 by FDA under an Emergency Use Authorization (EUA).  This EUA will remain in effect (meaning this test can be used) for the duration of the COVID-19 declaration under Section 564(b)(1) of the Act, 21 U.S.C. section 360bbb-3(b)(1), unless the authorization is terminated or revoked sooner. Performed at [facility], [address] [place], [place] [address]   MRSA PCR Screening      Status: None   Collection Time: 08/02/19 10:10 AM   Specimen: Nasopharyngeal  Result Value Ref Range Status   MRSA by PCR NEGATIVE NEGATIVE Final    Comment:        The GeneXpert MRSA Assay (FDA approved for NASAL specimens only), is one component of a comprehensive MRSA colonization surveillance program. It is not intended to diagnose MRSA infection nor to guide or monitor treatment for MRSA infections. Performed at [facility], [address], [place], [place] [address]          Radiology Studies: Dg Chest Portable 1 View  Result Date: 08/01/2019 CLINICAL DATA:  Short of breath EXAM: PORTABLE CHEST 1 VIEW COMPARISON:  May 29, 2019 FINDINGS: There is cardiomegaly with pulmonary venous hypertension. There is mild interstitial edema. There is mild atelectatic change in the left mid lung. There is no consolidation.  No adenopathy. There is aortic atherosclerosis. No bone lesions. IMPRESSION: Cardiomegaly with pulmonary vascular congestion. Mild interstitial edema. Mild atelectasis left mid lung. No consolidation or pleural effusion. Aortic Atherosclerosis (ICD10-I70.0). Electronically Signed   By: Lowella Grip III M.D.   On: 08/01/2019 08:51        Scheduled Meds: . carvedilol  3.125 mg Oral BID WC  . Chlorhexidine Gluconate Cloth  6 each Topical Q0600  . citalopram  20 mg Oral Daily  . gabapentin  300 mg Oral Daily  . heparin injection (subcutaneous)  5,000 Units Subcutaneous Q8H  . insulin aspart  0-5 Units Subcutaneous QHS  . insulin aspart  0-9 Units Subcutaneous TID WC  .  morphine injection  4 mg Intravenous Once  . pantoprazole  40 mg Oral Daily  . sodium chloride flush  3 mL Intravenous Q12H  . sodium zirconium cyclosilicate  10 g Oral Daily   Continuous Infusions: . sodium chloride    . sodium chloride       LOS: 1 day    Time spent: [place], MD [facility]  If 7PM-7AM, please contact night-coverage   08/02/2019, 1:47 PM

## 2019-08-03 LAB — GLUCOSE, CAPILLARY
Glucose-Capillary: 86 mg/dL (ref 70–99)
Glucose-Capillary: 124 mg/dL — ABNORMAL HIGH (ref 70–99)
Glucose-Capillary: 137 mg/dL — ABNORMAL HIGH (ref 70–99)
Glucose-Capillary: 95 mg/dL (ref 70–99)

## 2019-08-03 LAB — PARATHYROID HORMONE, INTACT (NO CA): PTH: 359 pg/mL — ABNORMAL HIGH (ref 15–65)

## 2019-08-03 LAB — PHOSPHORUS: Phosphorus: 9.1 mg/dL — ABNORMAL HIGH (ref 2.5–4.6)

## 2019-08-03 LAB — HEPATITIS B SURFACE ANTIBODY, QUANTITATIVE: Hep B S AB Quant (Post): >1000 m[IU]/mL (ref >9.9)

## 2019-08-03 MED ORDER — RENA-VITE PO TABS
1.0000 | ORAL_TABLET | Freq: Every day | ORAL | Status: DC
  Administered 2019-08-03: 1 via ORAL
  Filled 2019-08-03 (×2): qty 1

## 2019-08-03 MED ORDER — NEPRO/CARBSTEADY PO LIQD
237.0000 mL | Freq: Two times a day (BID) | ORAL | Status: DC
  Administered 2019-08-04 (×2): 237 mL via ORAL

## 2019-08-03 NOTE — Progress Notes (Signed)
Pre HD tx assessment    08/03/19 1800  Neurological  Level of Consciousness Alert  Orientation Level Oriented X4  Respiratory  Respiratory Pattern Regular;Unlabored  Chest Assessment Chest expansion symmetrical  Bilateral Breath Sounds Clear;Diminished  Cardiac  Pulse Regular  Heart Sounds S1, S2  Jugular Venous Distention (JVD) No  ECG Monitor Yes  Cardiac Rhythm SB;NSR  Antiarrhythmic device No  Vascular  R Radial Pulse +2  L Radial Pulse +2  Integumentary  Integumentary (WDL) WDL  Musculoskeletal  Musculoskeletal (WDL) WDL  Gastrointestinal  Bowel Sounds Assessment Active  Last BM Date 08/01/19  GU Assessment  Genitourinary (WDL) X  Genitourinary Symptoms Oliguria  Psychosocial  Psychosocial (WDL) WDL

## 2019-08-03 NOTE — Progress Notes (Signed)
HD Tx completed    08/03/19 2145  Vital Signs  Pulse Rate 68  Pulse Rate Source Dinamap  BP (!) 178/86  BP Location Right Arm  BP Method Automatic  Patient Position (if appropriate) Lying  Oxygen Therapy  SpO2 98 %  O2 Device Room Air  Pain Assessment  Pain Scale 0-10  Pain Score 0  During Hemodialysis Assessment  Blood Flow Rate (mL/min) 400 mL/min  Arterial Pressure (mmHg) -120 mmHg  Venous Pressure (mmHg) 250 mmHg  Transmembrane Pressure (mmHg) 50 mmHg  Ultrafiltration Rate (mL/min) 570 mL/min  Dialysate Flow Rate (mL/min) 800 ml/min  Conductivity: Machine  15.3  HD Safety Checks Performed Yes  Intra-Hemodialysis Comments Tx completed

## 2019-08-03 NOTE — Progress Notes (Signed)
HD Tx Start    08/03/19 1815  Vital Signs  Temp 98.1 F (36.7 C)  Temp Source Oral  Pulse Rate 62  Pulse Rate Source Dinamap  Resp 18  BP (!) 172/93  BP Location Right Arm  BP Method Automatic  Patient Position (if appropriate) Lying  Oxygen Therapy  SpO2 100 %  O2 Device Room Air  Pain Assessment  Pain Scale 0-10  Pain Score 0  During Hemodialysis Assessment  Blood Flow Rate (mL/min) 400 mL/min  Arterial Pressure (mmHg) -130 mmHg  Venous Pressure (mmHg) 250 mmHg  Transmembrane Pressure (mmHg) 50 mmHg  Ultrafiltration Rate (mL/min) 570 mL/min  Dialysate Flow Rate (mL/min) 800 ml/min  Conductivity: Machine  14  HD Safety Checks Performed Yes  Dialysis Fluid Bolus Normal Saline  Bolus Amount (mL) 250 mL  Intra-Hemodialysis Comments Tx initiated

## 2019-08-03 NOTE — Progress Notes (Signed)
Pre HD Tx Note    08/03/19 1800  Hand-Off documentation  Report given to (Full Name) Newt Minion RN  Report received from (Full Name) Mauricia Area RN   Vital Signs  Temp 98.1 F (36.7 C)  Temp Source Oral  Pulse Rate 63  Pulse Rate Source Dinamap  Resp 17  BP (!) 165/79  BP Location Right Arm  BP Method Automatic  Patient Position (if appropriate) Lying  Oxygen Therapy  SpO2 97 %  O2 Device Room Air  Pain Assessment  Pain Scale 0-10  Pain Score 0  Machine Checks  Machine Number 2  Station Number  (Room 260A)  UF/Alarm Test Passed  Conductivity: Meter 13.8  Conductivity: Machine  13.9  pH 7.4  Reverse Osmosis  (Portable RO 3)  Normal Saline Lot Number [id number]  Dialyzer Lot Number [id number]  Disposable Set Lot Number [id number]  Machine Temperature 98.6 F (37 C)  Musician and Audible Yes  Blood Lines Intact and Secured Yes  Pre Treatment Patient Checks  Vascular access used during treatment Fistula  HD catheter dressing before treatment WDL  Patient is receiving dialysis in a chair  (in bed)  Hepatitis B Surface Antigen Results Negative  Date Hepatitis B Surface Antigen Drawn 08/01/19  Hepatitis B Surface Antibody  (>10)  Date Hepatitis B Surface Antibody Drawn 08/01/19  Hemodialysis Consent Verified Yes  Hemodialysis Standing Orders Initiated Yes  ECG (Telemetry) Monitor On Yes  Prime Ordered Normal Saline  Length of  DialysisTreatment -hour(s) 3.5 Hour(s)  Dialysis Treatment Comments  (Na140)  Dialyzer Elisio 17H NR  Dialysate 2K;2.5 Ca  Dialysis Anticoagulant None  Dialysate Flow Ordered 800  Blood Flow Rate Ordered 400 mL/min  Ultrafiltration Goal 1.5 Liters  Pre Treatment Labs Renal panel;CBC  Dialysis Blood Pressure Support Ordered Albumin  Education / Care Plan  Dialysis Education Provided Yes  Documented Education in Care Plan Yes  Fistula / Graft Left Upper arm Arteriovenous fistula  No Placement Date or Time found.   Orientation: Left   Access Location: Upper arm  Access Type: Arteriovenous fistula  Site Condition No complications  Fistula / Graft Assessment Present;Absent ;Thrill;Aneurism present  Status Accessed  Needle Size 15  Drainage Description None

## 2019-08-03 NOTE — Progress Notes (Signed)
[facility] APP Team 5 - Progress Note  Larri Hamel [id number] DOB: 1956/01/12 DOA: 08/01/2019 PCP: Patient, No Pcp Per   Brief narrative: This is a 63 year old Hispanic male with a past male history notable for diabetes, hypertension and end-stage renal disease on dialysis who reports approximately 2-week history of diarrhea prior to admission.  Patient was unable to complaints dialysis secondary to diarrhea presented to the ED because he felt profoundly weak with inability to walk.  In the ER patient was found to have potassium of 7.4 with a creatinine of 12 and a BUN of 130.  He did emergently for his hyperkalemia and the case was discussed with both GI for the diarrhea as well as nephrology for emergent dialysis.  He was subsequently admitted for further eval and treatment after reluctantly agreeing to undergo treatment  HPI/Subjective: No further diarrhea  Assessment/Plan: Hyperkalemia: -Secondary to missing dialysis with significant improvement in potassium levels after dialysis -Next dialysis treatment due today 11/4 -Received emergency treatment with calcium and Lokelma -Continue to follow labs post dialysis  Acute diarrhea secondary to C. difficile colitis -Resolved -GI pathogen panel negative but subsequent C. difficile positive -GI recommends oral vancomycin 125 mg 4 times daily -No severe or fulminant symptoms and no other inpatient GI needs at this time  End-stage renal disease -Nephrology managing -Electrolyte monitoring  Hypertension: -Continue home medications: Carvedilol,  Chronic combined systolic and diastolic heart failure: -Ejection fraction 35 to 40% -Volume management through dialysis   DVT prophylaxis: SQ Lovenox  Code Status: Full code  Family Communication:  Patient only  Disposition Plan/Expected LOS: Anticipate discharge in a.m. 11/5  Consultants: Gastroenterology Nephrology  Procedures: None  Cultures: 11/2 C.  difficile quick scan x2+  Antibiotics: Oral vancomycin started 11/3  Objective: Blood pressure (!) 178/93, pulse 70, temperature 97.6 F (36.4 C), temperature source Oral, resp. rate 18, height 5\' 8"  (1.727 m), weight 103.6 kg, SpO2 100 %.  Intake/Output Summary (Last 24 hours) at 08/03/2019 1306 Last data filed at 08/03/2019 1015 Gross per 24 hour  Intake 120 ml  Output 0 ml  Net 120 ml     Exam: Gen: No acute respiratory distress, appears stated age, alert Neuro: Alert and oriented x3, moving all extremities x4, sensation intact, no focal abnormalities appreciated Chest: Clear to auscultation bilaterally without wheezes, rhonchi or crackles, no increased work of breathing, room air Cardiac: Regular rate and rhythm, S1-S2, no rubs murmurs or gallops, no peripheral edema, no JVD Abdomen: Soft nontender nondistended without obvious hepatosplenomegaly, no ascites Extremities: Symmetrical in appearance without cyanosis, clubbing or effusion; dialysis fistula left upper arm unremarkable  Scheduled Meds:  Scheduled Meds: . calcium acetate  2,668 mg Oral TID WC  . carvedilol  3.125 mg Oral BID WC  . Chlorhexidine Gluconate Cloth  6 each Topical Q0600  . citalopram  20 mg Oral Daily  . gabapentin  300 mg Oral Daily  . heparin injection (subcutaneous)  5,000 Units Subcutaneous Q8H  . insulin aspart  0-5 Units Subcutaneous QHS  . insulin aspart  0-9 Units Subcutaneous TID WC  .  morphine injection  4 mg Intravenous Once  . pantoprazole  40 mg Oral Daily  . sevelamer carbonate  3,200 mg Oral TID WC  . sodium chloride flush  3 mL Intravenous Q12H  . sodium zirconium cyclosilicate  10 g Oral Daily  . vancomycin  125 mg Oral Q6H   Continuous Infusions: . sodium chloride    . sodium chloride  Data Reviewed: Basic Metabolic Panel: Recent Labs  Lab 08/01/19 0810 08/01/19 1710 08/02/19 0609  NA 137  --  139  K 7.4* 4.7 5.8*  CL 98  --  96*  CO2 17*  --  28  GLUCOSE 111*   --  108*  BUN 130*  --  80*  CREATININE 12.81*  --  9.35*  CALCIUM 9.3  --  8.7*  PHOS  --  7.1*  --    Liver Function Tests: Recent Labs  Lab 08/01/19 0810 08/02/19 0609  AST 18 17  ALT 18 15  ALKPHOS 166* 125  BILITOT 1.2 1.0  PROT 8.1 6.8  ALBUMIN 4.3 3.4*   Recent Labs  Lab 08/01/19 0810  LIPASE 37   No results for input(s): AMMONIA in the last 168 hours. CBC: Recent Labs  Lab 08/01/19 0810 08/02/19 0609  WBC 6.8 4.2  HGB 13.3 10.7*  HCT 41.9 32.8*  MCV 94.4 91.9  PLT 154 107*   Cardiac Enzymes: No results for input(s): CKTOTAL, CKMB, CKMBINDEX, TROPONINI in the last 168 hours. BNP (last 3 results) Recent Labs    05/28/19 0545  BNP 4,023.0*    ProBNP (last 3 results) No results for input(s): PROBNP in the last 8760 hours.  CBG: Recent Labs  Lab 08/02/19 1219 08/02/19 1703 08/02/19 2145 08/03/19 0744 08/03/19 1123  GLUCAP 93 115* 157* 86 124*    Recent Results (from the past 240 hour(s))  SARS Coronavirus 2 by RT PCR (hospital order, performed in [facility] hospital lab) Nasopharyngeal Nasopharyngeal Swab     Status: None   Collection Time: 08/01/19 10:26 AM   Specimen: Nasopharyngeal Swab  Result Value Ref Range Status   SARS Coronavirus 2 NEGATIVE NEGATIVE Final    Comment: (NOTE) If result is NEGATIVE SARS-CoV-2 target nucleic acids are NOT DETECTED. The SARS-CoV-2 RNA is generally detectable in upper and lower  respiratory specimens during the acute phase of infection. The lowest  concentration of SARS-CoV-2 viral copies this assay can detect is 250  copies / mL. A negative result does not preclude SARS-CoV-2 infection  and should not be used as the sole basis for treatment or other  patient management decisions.  A negative result may occur with  improper specimen collection / handling, submission of specimen other  than nasopharyngeal swab, presence of viral mutation(s) within the  areas targeted by this assay, and inadequate number  of viral copies  (<250 copies / mL). A negative result must be combined with clinical  observations, patient history, and epidemiological information. If result is POSITIVE SARS-CoV-2 target nucleic acids are DETECTED. The SARS-CoV-2 RNA is generally detectable in upper and lower  respiratory specimens dur ing the acute phase of infection.  Positive  results are indicative of active infection with SARS-CoV-2.  Clinical  correlation with patient history and other diagnostic information is  necessary to determine patient infection status.  Positive results do  not rule out bacterial infection or co-infection with other viruses. If result is PRESUMPTIVE POSTIVE SARS-CoV-2 nucleic acids MAY BE PRESENT.   A presumptive positive result was obtained on the submitted specimen  and confirmed on repeat testing.  While 2019 novel coronavirus  (SARS-CoV-2) nucleic acids may be present in the submitted sample  additional confirmatory testing may be necessary for epidemiological  and / or clinical management purposes  to differentiate between  SARS-CoV-2 and other Sarbecovirus currently known to infect humans.  If clinically indicated additional testing with an alternate test  methodology (  [id number]) is advised. The SARS-CoV-2 RNA is generally  detectable in upper and lower respiratory sp ecimens during the acute  phase of infection. The expected result is Negative. Fact Sheet for Patients:  StrictlyIdeas.no Fact Sheet for Healthcare Providers: BankingDealers.co.za This test is not yet approved or cleared by the [place] FDA and has been authorized for detection and/or diagnosis of SARS-CoV-2 by FDA under an Emergency Use Authorization (EUA).  This EUA will remain in effect (meaning this test can be used) for the duration of the COVID-19 declaration under Section 564(b)(1) of the Act, 21 U.S.C. section 360bbb-3(b)(1), unless the authorization is  terminated or revoked sooner. Performed at [facility], [address] [place], [place] [address]   C difficile quick scan w PCR reflex     Status: Abnormal   Collection Time: 08/01/19  5:09 PM   Specimen: STOOL  Result Value Ref Range Status   C Diff antigen POSITIVE (A) NEGATIVE Final   C Diff toxin POSITIVE (A) NEGATIVE Final   C Diff interpretation Toxin producing C. difficile detected.  Final    Comment: CRITICAL RESULT CALLED TO, READ BACK BY AND VERIFIED WITH: BERENICE ALEJO 08/02/19 @ 27  [place] Performed at [facility], [address] [place], [place] [address]   MRSA PCR Screening     Status: None   Collection Time: 08/02/19 10:10 AM   Specimen: Nasopharyngeal  Result Value Ref Range Status   MRSA by PCR NEGATIVE NEGATIVE Final    Comment:        The GeneXpert MRSA Assay (FDA approved for NASAL specimens only), is one component of a comprehensive MRSA colonization surveillance program. It is not intended to diagnose MRSA infection nor to guide or monitor treatment for MRSA infections. Performed at [facility], [address] [place], [place] [address]      Studies:  Recent x-ray studies have been reviewed in detail by the Attending Physician  Time spent :      Erin Hearing, [facility] Office  [phone] Pager [phone]   **If unable to reach the above provider after paging please contact the [place] @ [phone]  On-Call/Text Page:      [email or web address]      password [email or web address]  If 7PM-7AM, please contact night-coverage [email or web address] Password [email or web address] 08/03/2019, 1:06 PM   LOS: 2 days

## 2019-08-03 NOTE — Progress Notes (Signed)
[facility]  ROUNDING NOTE   Subjective:  Patient due for hemodialysis today. Currently not having diarrhea at the moment. Also denies nausea and vomiting.  Objective:  Vital signs in last 24 hours:  Temp:  [97.6 F (36.4 C)-98 F (36.7 C)] 97.6 F (36.4 C) (11/04 0743) Pulse Rate:  [62-71] 70 (11/04 0743) Resp:  [18-20] 18 (11/04 0743) BP: (163-189)/(88-97) 178/93 (11/04 0743) SpO2:  [95 %-100 %] 100 % (11/04 0743) Weight:  [103.6 kg] 103.6 kg (11/04 0405)  Weight change: 10.6 kg Filed Weights   08/01/19 0708 08/02/19 0157 08/03/19 0405  Weight: 96 kg 101.4 kg 103.6 kg    Intake/Output: I/O last 3 completed shifts: In: 3 [I.V.:3] Out: 1627 [Other:1627]   Intake/Output this shift:  No intake/output data recorded.  Physical Exam: General: No acute distress  Head: Normocephalic, atraumatic. Moist oral mucosal membranes  Eyes: Anicteric  Neck: Supple, trachea midline  Lungs:  Clear to auscultation, normal effort  Heart: S1S2 no rubs  Abdomen:  Soft, mild distention, bowel sounds present  Extremities: No peripheral edema.  Neurologic: Awake, alert, following commands  Skin: No lesions  Access: Aneurysmal left upper extremity AV fistula    Basic Metabolic Panel: Recent Labs  Lab 08/01/19 0810 08/01/19 1710 08/02/19 0609  NA 137  --  139  K 7.4* 4.7 5.8*  CL 98  --  96*  CO2 17*  --  28  GLUCOSE 111*  --  108*  BUN 130*  --  80*  CREATININE 12.81*  --  9.35*  CALCIUM 9.3  --  8.7*  PHOS  --  7.1*  --     Liver Function Tests: Recent Labs  Lab 08/01/19 0810 08/02/19 0609  AST 18 17  ALT 18 15  ALKPHOS 166* 125  BILITOT 1.2 1.0  PROT 8.1 6.8  ALBUMIN 4.3 3.4*   Recent Labs  Lab 08/01/19 0810  LIPASE 37   No results for input(s): AMMONIA in the last 168 hours.  CBC: Recent Labs  Lab 08/01/19 0810 08/02/19 0609  WBC 6.8 4.2  HGB 13.3 10.7*  HCT 41.9 32.8*  MCV 94.4 91.9  PLT 154 107*    Cardiac Enzymes: No results  for input(s): CKTOTAL, CKMB, CKMBINDEX, TROPONINI in the last 168 hours.  BNP: Invalid input(s): POCBNP  CBG: Recent Labs  Lab 08/02/19 0802 08/02/19 1219 08/02/19 1703 08/02/19 2145 08/03/19 0744  GLUCAP 75 93 115* 157* 86    Microbiology: Results for orders placed or performed during the hospital encounter of 08/01/19  SARS Coronavirus 2 by RT PCR (hospital order, performed in [facility] hospital lab) Nasopharyngeal Nasopharyngeal Swab     Status: None   Collection Time: 08/01/19 10:26 AM   Specimen: Nasopharyngeal Swab  Result Value Ref Range Status   SARS Coronavirus 2 NEGATIVE NEGATIVE Final    Comment: (NOTE) If result is NEGATIVE SARS-CoV-2 target nucleic acids are NOT DETECTED. The SARS-CoV-2 RNA is generally detectable in upper and lower  respiratory specimens during the acute phase of infection. The lowest  concentration of SARS-CoV-2 viral copies this assay can detect is 250  copies / mL. A negative result does not preclude SARS-CoV-2 infection  and should not be used as the sole basis for treatment or other  patient management decisions.  A negative result may occur with  improper specimen collection / handling, submission of specimen other  than nasopharyngeal swab, presence of viral mutation(s) within the  areas targeted by this assay, and inadequate  number of viral copies  (<250 copies / mL). A negative result must be combined with clinical  observations, patient history, and epidemiological information. If result is POSITIVE SARS-CoV-2 target nucleic acids are DETECTED. The SARS-CoV-2 RNA is generally detectable in upper and lower  respiratory specimens dur ing the acute phase of infection.  Positive  results are indicative of active infection with SARS-CoV-2.  Clinical  correlation with patient history and other diagnostic information is  necessary to determine patient infection status.  Positive results do  not rule out bacterial infection or  co-infection with other viruses. If result is PRESUMPTIVE POSTIVE SARS-CoV-2 nucleic acids MAY BE PRESENT.   A presumptive positive result was obtained on the submitted specimen  and confirmed on repeat testing.  While 2019 novel coronavirus  (SARS-CoV-2) nucleic acids may be present in the submitted sample  additional confirmatory testing may be necessary for epidemiological  and / or clinical management purposes  to differentiate between  SARS-CoV-2 and other Sarbecovirus currently known to infect humans.  If clinically indicated additional testing with an alternate test  methodology [phone]) is advised. The SARS-CoV-2 RNA is generally  detectable in upper and lower respiratory sp ecimens during the acute  phase of infection. The expected result is Negative. Fact Sheet for Patients:  StrictlyIdeas.no Fact Sheet for Healthcare Providers: BankingDealers.co.za This test is not yet approved or cleared by the [place] FDA and has been authorized for detection and/or diagnosis of SARS-CoV-2 by FDA under an Emergency Use Authorization (EUA).  This EUA will remain in effect (meaning this test can be used) for the duration of the COVID-19 declaration under Section 564(b)(1) of the Act, 21 U.S.C. section 360bbb-3(b)(1), unless the authorization is terminated or revoked sooner. Performed at [facility], [address] [place], [place] [address]   C difficile quick scan w PCR reflex     Status: Abnormal   Collection Time: 08/01/19  5:09 PM   Specimen: STOOL  Result Value Ref Range Status   C Diff antigen POSITIVE (A) NEGATIVE Final   C Diff toxin POSITIVE (A) NEGATIVE Final   C Diff interpretation Toxin producing C. difficile detected.  Final    Comment: CRITICAL RESULT CALLED TO, READ BACK BY AND VERIFIED WITH: BERENICE ALEJO 08/02/19 @ 65  [place] Performed at [facility], [address] [place], [place] [address]    MRSA PCR Screening     Status: None   Collection Time: 08/02/19 10:10 AM   Specimen: Nasopharyngeal  Result Value Ref Range Status   MRSA by PCR NEGATIVE NEGATIVE Final    Comment:        The GeneXpert MRSA Assay (FDA approved for NASAL specimens only), is one component of a comprehensive MRSA colonization surveillance program. It is not intended to diagnose MRSA infection nor to guide or monitor treatment for MRSA infections. Performed at [facility], [address] [place], [place] [address]     Coagulation Studies: Recent Labs    08/01/19 0810  LABPROT 15.5*  INR 1.2    Urinalysis: No results for input(s): COLORURINE, LABSPEC, PHURINE, GLUCOSEU, HGBUR, BILIRUBINUR, KETONESUR, PROTEINUR, UROBILINOGEN, NITRITE, LEUKOCYTESUR in the last 72 hours.  Invalid input(s): APPERANCEUR    Imaging: Ct Head Wo Contrast  Result Date: 08/02/2019 CLINICAL DATA:  Headaches following soccer injury 1 week ago, initial encounter EXAM: CT HEAD WITHOUT CONTRAST TECHNIQUE: Contiguous axial images were obtained from the base of the skull through the vertex without intravenous contrast. COMPARISON:  12/06/2018 FINDINGS:  Brain: Stable cerebellar atrophy is noted. Mild cerebral atrophy is seen as well. No findings to suggest acute hemorrhage, acute infarction or space-occupying mass lesion noted. Vascular: No hyperdense vessel or unexpected calcification. Skull: Normal. Negative for fracture or focal lesion. Sinuses/Orbits: No acute finding. Other: None IMPRESSION: Chronic changes without acute abnormality. Electronically Signed   By: Inez Catalina M.D.   On: 08/02/2019 13:48     Medications:   . sodium chloride    . sodium chloride     . calcium acetate  2,668 mg Oral TID WC  . carvedilol  3.125 mg Oral BID WC  . Chlorhexidine Gluconate Cloth  6 each Topical Q0600  . citalopram  20 mg Oral Daily  . gabapentin  300 mg Oral Daily  . heparin injection (subcutaneous)  5,000 Units  Subcutaneous Q8H  . insulin aspart  0-5 Units Subcutaneous QHS  . insulin aspart  0-9 Units Subcutaneous TID WC  .  morphine injection  4 mg Intravenous Once  . pantoprazole  40 mg Oral Daily  . sevelamer carbonate  3,200 mg Oral TID WC  . sodium chloride flush  3 mL Intravenous Q12H  . sodium zirconium cyclosilicate  10 g Oral Daily  . vancomycin  125 mg Oral Q6H     Assessment/ Plan:  63 y.o. male with end stage renal disease on hemodialysis, insulin dependent diabetes mellitus type 2, gout, hypertension, anemia of chronic kidney disease, secondary hyperparathyroidism,depression, admitted now with diarrhea and having missed dialysis x2 sessions.  MWF [facility] Left AVF  1.  ESRD on HD MWF/hyperkalemia.  Patient had missed  2 outpatient dialysis treatment secondary to  diarrhea.   -Patient due for hemodialysis today. Orders have been prepared. Most recent serum potassium was 5.8. This should come down with dialysis treatment. Okay to maintain the patient on sodium zirconium for now.  2.  Secondary hyperparathyroidism. Repeat serum phosphorus today. Otherwise maintain the patient on Renvela. PTH currently separable at 359.  3.  Diarrhea. Patient being treated with vancomycin 125 mg every 6 hours.  4.  Anemia of chronic kidney disease. No indication for Epogen at the moment.   LOS: 2 Fia Hebert 11/4/202010:28 AM

## 2019-08-03 NOTE — Plan of Care (Signed)
°  Problem: Fluid Volume: °Goal: Compliance with measures to maintain balanced fluid volume will improve °Outcome: Progressing °  °Problem: Nutritional: °Goal: Ability to make healthy dietary choices will improve °Outcome: Progressing °  °

## 2019-08-03 NOTE — Progress Notes (Signed)
Post HD Tx Note  Pt tolerated well the HD. Tx. Pt Tx run for 3.5hrs on 2K2.5Ca Pt was on room air. Prescribed UF goal was met 2L. AF noticed to have prolonged bleeding especially arterial access.    08/03/19 2200  Hand-Off documentation  Report given to (Full Name) Britt Bolognese RN   Report received from (Full Name) Newt Minion RN  Vital Signs  Temp 98.1 F (36.7 C)  Temp Source Oral  Pulse Rate 67  Pulse Rate Source Dinamap  Resp 18  BP (!) 178/79  BP Location Right Arm  BP Method Automatic  Patient Position (if appropriate) Lying  Oxygen Therapy  SpO2 98 %  O2 Device Room Air  Pain Assessment  Pain Scale 0-10  Pain Score 0  Post-Hemodialysis Assessment  Rinseback Volume (mL) 250 mL  KECN 74.5 V  Dialyzer Clearance Lightly streaked  Duration of HD Treatment -hour(s) 3.5 hour(s)  Hemodialysis Intake (mL) 500 mL  UF Total -Machine (mL) 2000 mL  Net UF (mL) 1500 mL  Tolerated HD Treatment Yes  AVG/AVF Arterial Site Held (minutes) 8 minutes  AVG/AVF Venous Site Held (minutes) 13 minutes  Fistula / Graft Left Upper arm Arteriovenous fistula  No Placement Date or Time found.   Orientation: Left  Access Location: Upper arm  Access Type: Arteriovenous fistula  Site Condition Bleeding (prolonged bleeding)  Fistula / Graft Assessment Present;Absent ;Thrill;Aneurism present  Status Deaccessed  Drainage Description None

## 2019-08-03 NOTE — Progress Notes (Signed)
Post HD Tx Note    08/03/19 2135  Neurological  Level of Consciousness Alert  Orientation Level Oriented X4  Respiratory  Respiratory Pattern Regular;Unlabored  Chest Assessment Chest expansion symmetrical  Bilateral Breath Sounds Clear;Diminished  Cardiac  Pulse Regular  Heart Sounds S1, S2  Jugular Venous Distention (JVD) No  ECG Monitor Yes  Cardiac Rhythm SB;NSR  Antiarrhythmic device No  Vascular  R Radial Pulse +2  L Radial Pulse +2  Integumentary  Integumentary (WDL) WDL  Musculoskeletal  Musculoskeletal (WDL) WDL  Gastrointestinal  Bowel Sounds Assessment Active  GU Assessment  Genitourinary (WDL) X  Genitourinary Symptoms Oliguria  Psychosocial  Psychosocial (WDL) WDL

## 2019-08-03 NOTE — Progress Notes (Signed)
Initial Nutrition Assessment  DOCUMENTATION CODES:   Obesity unspecified  INTERVENTION:   Nepro Shake po BID, each supplement provides 425 kcal and 19 grams protein  Rena-vite daily   Na, P and K restrictions via health touch   NUTRITION DIAGNOSIS:   Increased nutrient needs related to chronic illness(ESRD on HD) as evidenced by increased estimated needs.  GOAL:   Patient will meet greater than or equal to 90% of their needs  MONITOR:   PO intake, Supplement acceptance, Labs, Weight trends, Skin, I & O's  REASON FOR ASSESSMENT:   Consult Diet education  ASSESSMENT:   63 year old Hispanic male with a past male history notable for diabetes, hypertension and end-stage renal disease on dialysis who reports approximately 2-week history of diarrhea prior to admission.   Pt with diarrhea, found to have C-diff. Unable to see patient as pt on contact precautions. Per chart review, pt with poor appetite and oral intake pta and recurrent falls. Pt documented to have eaten 75% of his breakfast today. RD will add supplements and vitamins to help pt meet his estimated needs. Will add Na, K and P restrictions via health touch. Pt not appropriate for diet education at this time. Will try and provide diet education prior to discharge.     Medications reviewed and include: phoslo, celexa, heparin, insulin, protonix, renvela, vancomycin  Labs reviewed: K 5.8(H), BUN 80(H), creat 9.35(H), P 7.1(H) Hgb 10.7(L), Hct 32.8(L) cbgs- 93, 115, 157, 86, 124 x 24 hrs AIC 6.0(H)- 8/29 iPTH - 359(H)  Unable to complete Nutrition-Focused physical exam at this time as pt on contact precautions.   Diet Order:   Diet Order            Diet Carb Modified Fluid consistency: Thin; Room service appropriate? Yes  Diet effective now             EDUCATION NEEDS:   Not appropriate for education at this time  Skin:  Skin Assessment: Reviewed RN Assessment  Last BM:  11/2  Height:   Ht Readings  from Last 1 Encounters:  08/01/19 5\' 8"  (1.727 m)    Weight:   Wt Readings from Last 1 Encounters:  08/03/19 103.6 kg    Ideal Body Weight:  70 kg  BMI:  Body mass index is 34.74 kg/m.  Estimated Nutritional Needs:   Kcal:  2100-2400kcal/day  Protein:  105-120g/day  Fluid:  UOP +1L  Koleen Distance MS, RD, LDN Pager #- [phone] Office#- [phone] After Hours Pager: [phone]

## 2019-08-03 NOTE — Progress Notes (Signed)
Entered patient room. Cleaned high contact surfaces with bleach wipes. Patient up in chair watching TV, no signs of distress.

## 2019-08-03 NOTE — Progress Notes (Signed)
PT Cancellation Note  Patient Details Name: Danny [name] MRN: [id number] DOB: [birth year]   Cancelled Treatment:    Reason Eval/Treat Not Completed: Other (comment)(PT order recieved, chart reviewed.)  Patient noted with critically elevated K+ (5.8), contraindicated for exertional activity at this time. Pt also scheduled to have HD today. Will continue efforts next date pending medical stability and appropriateness.  Lieutenant Diego PT, DPT [phone] PM,08/03/19 [phone]

## 2019-08-04 LAB — COMPREHENSIVE METABOLIC PANEL
ALT: 12 U/L (ref 0–44)
AST: 13 U/L — ABNORMAL LOW (ref 15–41)
Albumin: 3.3 g/dL — ABNORMAL LOW (ref 3.5–5.0)
Alkaline Phosphatase: 120 U/L (ref 38–126)
Anion gap: 13 (ref 5–15)
BUN: 46 mg/dL — ABNORMAL HIGH (ref 8–23)
CO2: 27 mmol/L (ref 22–32)
Calcium: 8.9 mg/dL (ref 8.9–10.3)
Chloride: 100 mmol/L (ref 98–111)
Creatinine, Ser: 6.69 mg/dL — ABNORMAL HIGH (ref 0.61–1.24)
GFR calc Af Amer: 9 mL/min — ABNORMAL LOW (ref >60)
GFR calc non Af Amer: 8 mL/min — ABNORMAL LOW (ref >60)
Glucose, Bld: 128 mg/dL — ABNORMAL HIGH (ref 70–99)
Potassium: 4.8 mmol/L (ref 3.5–5.1)
Sodium: 140 mmol/L (ref 135–145)
Total Bilirubin: 0.7 mg/dL (ref 0.3–1.2)
Total Protein: 6.7 g/dL (ref 6.5–8.1)

## 2019-08-04 LAB — GLUCOSE, CAPILLARY
Glucose-Capillary: 76 mg/dL (ref 70–99)
Glucose-Capillary: 126 mg/dL — ABNORMAL HIGH (ref 70–99)
Glucose-Capillary: 109 mg/dL — ABNORMAL HIGH (ref 70–99)

## 2019-08-04 LAB — CBC WITH DIFFERENTIAL/PLATELET
Abs Immature Granulocytes: 0.01 10*3/uL (ref 0.00–0.07)
Basophils Absolute: 0 10*3/uL (ref 0.0–0.1)
Basophils Relative: 1 %
Eosinophils Absolute: 0.1 10*3/uL (ref 0.0–0.5)
Eosinophils Relative: 3 %
HCT: 33.1 % — ABNORMAL LOW (ref 39.0–52.0)
Hemoglobin: 10.9 g/dL — ABNORMAL LOW (ref 13.0–17.0)
Immature Granulocytes: 0 %
Lymphocytes Relative: 14 %
Lymphs Abs: 0.4 10*3/uL — ABNORMAL LOW (ref 0.7–4.0)
MCH: 30.7 pg (ref 26.0–34.0)
MCHC: 32.9 g/dL (ref 30.0–36.0)
MCV: 93.2 fL (ref 80.0–100.0)
Monocytes Absolute: 0.5 10*3/uL (ref 0.1–1.0)
Monocytes Relative: 15 %
Neutro Abs: 1.9 10*3/uL (ref 1.7–7.7)
Neutrophils Relative %: 67 %
Platelets: 96 10*3/uL — ABNORMAL LOW (ref 150–400)
RBC: 3.55 MIL/uL — ABNORMAL LOW (ref 4.22–5.81)
RDW: 14.7 % (ref 11.5–15.5)
WBC: 2.9 10*3/uL — ABNORMAL LOW (ref 4.0–10.5)
nRBC: 0 % (ref 0.0–0.2)

## 2019-08-04 MED ORDER — VANCOMYCIN 50 MG/ML ORAL SOLUTION: 125.0000 mg | Freq: Four times a day (QID) | ORAL | 0 refills | Status: DC

## 2019-08-04 MED ORDER — NEPRO/CARBSTEADY PO LIQD: 237.0000 mL | Freq: Two times a day (BID) | ORAL | 0 refills | Status: DC

## 2019-08-04 MED ORDER — VANCOMYCIN HCL 125 MG PO CAPS: 125.0000 mg | ORAL_CAPSULE | Freq: Four times a day (QID) | ORAL | 0 refills | Status: DC

## 2019-08-04 MED ORDER — RENA-VITE PO TABS: 1.0000 | ORAL_TABLET | Freq: Every day | ORAL | 0 refills | Status: DC

## 2019-08-04 MED ORDER — NUT.TX IMPAIRED RENAL FXN,LAC-REDUCE 0.08 GRAM-1.8 KCAL/ML ORAL LIQUID
0 refills | 0 days
Start: 2019-08-04 — End: ?

## 2019-08-04 MED ORDER — VANCOMYCIN 125 MG CAPSULE
ORAL_CAPSULE | Freq: Four times a day (QID) | ORAL | 0 refills | 10.00000 days
Start: 2019-08-04 — End: ?

## 2019-08-04 MED ORDER — RENA-VITE RX 1 MG-60 MG-300 MCG TABLET
ORAL_TABLET | Freq: Every evening | ORAL | 0 refills | 30.00000 days
Start: 2019-08-04 — End: ?

## 2019-08-04 NOTE — Progress Notes (Signed)
[facility]  ROUNDING NOTE   Subjective:  Patient completed dialysis yesterday. Tolerated well. States that he is not having diarrhea currently.  Objective:  Vital signs in last 24 hours:  Temp:  [98.1 F (36.7 C)-98.6 F (37 C)] 98.2 F (36.8 C) (11/05 0826) Pulse Rate:  [58-73] 73 (11/05 0837) Resp:  [17-18] 18 (11/05 0605) BP: (164-188)/(68-96) 178/88 (11/05 0837) SpO2:  [96 %-100 %] 99 % (11/05 0826) Weight:  [102.9 kg] 102.9 kg (11/05 0605)  Weight change: -0.771 kg Filed Weights   08/02/19 0157 08/03/19 0405 08/04/19 0605  Weight: 101.4 kg 103.6 kg 102.9 kg    Intake/Output: I/O last 3 completed shifts: In: 58 [P.O.:120; I.V.:3] Out: 1500 [Other:1500]   Intake/Output this shift:  Total I/O In: 3 [I.V.:3] Out: -   Physical Exam: General: No acute distress  Head: Normocephalic, atraumatic. Moist oral mucosal membranes  Eyes: Anicteric  Neck: Supple, trachea midline  Lungs:  Clear to auscultation, normal effort  Heart: S1S2 no rubs  Abdomen:  Soft, NTND, bowel sounds present  Extremities: No peripheral edema.  Neurologic: Awake, alert, following commands  Skin: No lesions  Access: Aneurysmal left upper extremity AV fistula    Basic Metabolic Panel: Recent Labs  Lab 08/01/19 0810 08/01/19 1710 08/02/19 0609 08/03/19 1820 08/04/19 0527  NA 137  --  139  --  140  K 7.4* 4.7 5.8*  --  4.8  CL 98  --  96*  --  100  CO2 17*  --  28  --  27  GLUCOSE 111*  --  108*  --  128*  BUN 130*  --  80*  --  46*  CREATININE 12.81*  --  9.35*  --  6.69*  CALCIUM 9.3  --  8.7*  --  8.9  PHOS  --  7.1*  --  9.1*  --     Liver Function Tests: Recent Labs  Lab 08/01/19 0810 08/02/19 0609 08/04/19 0527  AST 18 17 13*  ALT 18 15 12   ALKPHOS 166* 125 120  BILITOT 1.2 1.0 0.7  PROT 8.1 6.8 6.7  ALBUMIN 4.3 3.4* 3.3*   Recent Labs  Lab 08/01/19 0810  LIPASE 37   No results for input(s): AMMONIA in the last 168 hours.  CBC: Recent Labs   Lab 08/01/19 0810 08/02/19 0609 08/04/19 0527  WBC 6.8 4.2 2.9*  NEUTROABS  --   --  1.9  HGB 13.3 10.7* 10.9*  HCT 41.9 32.8* 33.1*  MCV 94.4 91.9 93.2  PLT 154 107* 96*    Cardiac Enzymes: No results for input(s): CKTOTAL, CKMB, CKMBINDEX, TROPONINI in the last 168 hours.  BNP: Invalid input(s): POCBNP  CBG: Recent Labs  Lab 08/03/19 0744 08/03/19 1123 08/03/19 1727 08/03/19 2131 08/04/19 0827  GLUCAP 86 124* 137* 95 76    Microbiology: Results for orders placed or performed during the hospital encounter of 08/01/19  SARS Coronavirus 2 by RT PCR (hospital order, performed in [facility] hospital lab) Nasopharyngeal Nasopharyngeal Swab     Status: None   Collection Time: 08/01/19 10:26 AM   Specimen: Nasopharyngeal Swab  Result Value Ref Range Status   SARS Coronavirus 2 NEGATIVE NEGATIVE Final    Comment: (NOTE) If result is NEGATIVE SARS-CoV-2 target nucleic acids are NOT DETECTED. The SARS-CoV-2 RNA is generally detectable in upper and lower  respiratory specimens during the acute phase of infection. The lowest  concentration of SARS-CoV-2 viral copies this assay can detect is 250  copies / mL. A negative result does not preclude SARS-CoV-2 infection  and should not be used as the sole basis for treatment or other  patient management decisions.  A negative result may occur with  improper specimen collection / handling, submission of specimen other  than nasopharyngeal swab, presence of viral mutation(s) within the  areas targeted by this assay, and inadequate number of viral copies  (<250 copies / mL). A negative result must be combined with clinical  observations, patient history, and epidemiological information. If result is POSITIVE SARS-CoV-2 target nucleic acids are DETECTED. The SARS-CoV-2 RNA is generally detectable in upper and lower  respiratory specimens dur ing the acute phase of infection.  Positive  results are indicative of active infection  with SARS-CoV-2.  Clinical  correlation with patient history and other diagnostic information is  necessary to determine patient infection status.  Positive results do  not rule out bacterial infection or co-infection with other viruses. If result is PRESUMPTIVE POSTIVE SARS-CoV-2 nucleic acids MAY BE PRESENT.   A presumptive positive result was obtained on the submitted specimen  and confirmed on repeat testing.  While 2019 novel coronavirus  (SARS-CoV-2) nucleic acids may be present in the submitted sample  additional confirmatory testing may be necessary for epidemiological  and / or clinical management purposes  to differentiate between  SARS-CoV-2 and other Sarbecovirus currently known to infect humans.  If clinically indicated additional testing with an alternate test  methodology [phone]) is advised. The SARS-CoV-2 RNA is generally  detectable in upper and lower respiratory sp ecimens during the acute  phase of infection. The expected result is Negative. Fact Sheet for Patients:  StrictlyIdeas.no Fact Sheet for Healthcare Providers: BankingDealers.co.za This test is not yet approved or cleared by the [place] FDA and has been authorized for detection and/or diagnosis of SARS-CoV-2 by FDA under an Emergency Use Authorization (EUA).  This EUA will remain in effect (meaning this test can be used) for the duration of the COVID-19 declaration under Section 564(b)(1) of the Act, 21 U.S.C. section 360bbb-3(b)(1), unless the authorization is terminated or revoked sooner. Performed at [facility], [address] [place], [place] [address]   Stool culture (children & immunocomp patients)     Status: None (Preliminary result)   Collection Time: 08/01/19  5:09 PM   Specimen: Stool  Result Value Ref Range Status   Salmonella/Shigella Screen PENDING  Incomplete   Campylobacter Culture PENDING  Incomplete   E coli, Shiga  toxin Assay Negative Negative Final    Comment: (NOTE) Performed At: [facility] [place], [place] [id number] Rush Farmer MD [id number]   C difficile quick scan w PCR reflex     Status: Abnormal   Collection Time: 08/01/19  5:09 PM   Specimen: STOOL  Result Value Ref Range Status   C Diff antigen POSITIVE (A) NEGATIVE Final   C Diff toxin POSITIVE (A) NEGATIVE Final   C Diff interpretation Toxin producing C. difficile detected.  Final    Comment: CRITICAL RESULT CALLED TO, READ BACK BY AND VERIFIED WITH: Adriana Mccallum 08/02/19 @ 37  [place] Performed at [facility], [address] [place], [place] [address]   MRSA PCR Screening     Status: None   Collection Time: 08/02/19 10:10 AM   Specimen: Nasopharyngeal  Result Value Ref Range Status   MRSA by PCR NEGATIVE NEGATIVE Final    Comment:        The GeneXpert MRSA Assay (FDA  approved for NASAL specimens only), is one component of a comprehensive MRSA colonization surveillance program. It is not intended to diagnose MRSA infection nor to guide or monitor treatment for MRSA infections. Performed at [facility], [address] [place], [place] [address]     Coagulation Studies: No results for input(s): LABPROT, INR in the last 72 hours.  Urinalysis: No results for input(s): COLORURINE, LABSPEC, PHURINE, GLUCOSEU, HGBUR, BILIRUBINUR, KETONESUR, PROTEINUR, UROBILINOGEN, NITRITE, LEUKOCYTESUR in the last 72 hours.  Invalid input(s): APPERANCEUR    Imaging: Ct Head Wo Contrast  Result Date: 08/02/2019 CLINICAL DATA:  Headaches following soccer injury 1 week ago, initial encounter EXAM: CT HEAD WITHOUT CONTRAST TECHNIQUE: Contiguous axial images were obtained from the base of the skull through the vertex without intravenous contrast. COMPARISON:  12/06/2018 FINDINGS: Brain: Stable cerebellar atrophy is noted. Mild cerebral atrophy is seen as well. No findings to suggest acute  hemorrhage, acute infarction or space-occupying mass lesion noted. Vascular: No hyperdense vessel or unexpected calcification. Skull: Normal. Negative for fracture or focal lesion. Sinuses/Orbits: No acute finding. Other: None IMPRESSION: Chronic changes without acute abnormality. Electronically Signed   By: Inez Catalina M.D.   On: 08/02/2019 13:48     Medications:   . sodium chloride    . sodium chloride     . calcium acetate  2,668 mg Oral TID WC  . carvedilol  3.125 mg Oral BID WC  . Chlorhexidine Gluconate Cloth  6 each Topical Q0600  . citalopram  20 mg Oral Daily  . feeding supplement (NEPRO CARB STEADY)  237 mL Oral BID BM  . gabapentin  300 mg Oral Daily  . heparin injection (subcutaneous)  5,000 Units Subcutaneous Q8H  . insulin aspart  0-5 Units Subcutaneous QHS  . insulin aspart  0-9 Units Subcutaneous TID WC  .  morphine injection  4 mg Intravenous Once  . multivitamin  1 tablet Oral QHS  . pantoprazole  40 mg Oral Daily  . sevelamer carbonate  3,200 mg Oral TID WC  . sodium chloride flush  3 mL Intravenous Q12H  . sodium zirconium cyclosilicate  10 g Oral Daily  . vancomycin  125 mg Oral Q6H     Assessment/ Plan:  63 y.o. male with end stage renal disease on hemodialysis, insulin dependent diabetes mellitus type 2, gout, hypertension, anemia of chronic kidney disease, secondary hyperparathyroidism,depression, admitted now with diarrhea and having missed dialysis x2 sessions.  MWF [facility] Left AVF  1.  ESRD on HD MWF/hyperkalemia.  Patient had missed  2 outpatient dialysis treatment secondary to  diarrhea.   -Patient completed dialysis yesterday.  No acute indication for dialysis today.  He will resume normal outpatient dialysis treatment tomorrow.  2.  Secondary hyperparathyroidism.  Phosphorus was quite high at 9.1.  Maintain the patient on Renvela 3200 mg p.o. 3 times daily with meals and continue to monitor bone mineral metabolism  parameters as an outpatient.  3.  Diarrhea. Patient being treated with vancomycin 125 mg every 6 hours.  4.  Anemia of chronic kidney disease.  Hemoglobin 10.9.  Patient did not receive Epogen during the hospitalization.   LOS: 3 Danny [name] 11/5/202010:39 AM

## 2019-08-04 NOTE — Progress Notes (Signed)
Established hemodialysis patient known at [facility] MWF 6:40. Please contact me with any dialysis placement concerns.  Elvera Bicker Dialysis Coordinator [phone]

## 2019-08-04 NOTE — Progress Notes (Signed)
Went over discharge instruction with the patient and the wife including medications using interpreter cart. Discontinue PIV and telemetry monitor. Provided dose for the vancomycin for patient to take at home.

## 2019-08-04 NOTE — Evaluation (Signed)
Physical Therapy Evaluation Patient Details Name: Danny [name] MRN: [id number] DOB: [birth year] Today's Date: 08/04/2019   History of Present Illness  Pt is 63 yr old man who carries a past medical history significant for DM II, HTN, and ESRD on HD. He presented to [facility] ED on 08/01/2019 with complaints of 2 weeks of diarrhea had been unable to recieve HD, work up positive for C-diff.    Clinical Impression  The patient was up in chair at start of session, interpreter utilized during session (language line). The patient reported living in a second floor apartment with extended family, previously independent at baseline, does not work, no falls in the last 6 months (except for when he was playing with his grandson).   The patient was up in chair at start of session, demonstrated UE and LE ROM and strength WFLs. Ambulated ~50ft in hospital room mod I, no apparent deficits in balance. The patient demonstrated and reported return to baseline level of functioning, no further acute PT needs indicated. PT to sign off. Please reconsult PT if pt status changes or acute needs are identified.      Follow Up Recommendations No PT follow up    Equipment Recommendations  None recommended by PT    Recommendations for Other Services       Precautions / Restrictions Precautions Precautions: None Restrictions Weight Bearing Restrictions: No      Mobility  Bed Mobility               General bed mobility comments: deferred up in chair  Transfers Overall transfer level: Modified independent               General transfer comment: use of hands, safe  Ambulation/Gait Ambulation/Gait assistance: Modified independent (Device/Increase time) Gait Distance (Feet): 50 Feet   Gait Pattern/deviations: WFL(Within Functional Limits)        Stairs            Wheelchair Mobility    Modified Rankin (Stroke Patients Only)       Balance Overall balance assessment: No  apparent balance deficits (not formally assessed)                                           Pertinent Vitals/Pain Pain Assessment: No/denies pain    Home Living Family/patient expects to be discharged to:: Private residence Living Arrangements: Spouse/significant other;Children;Other relatives Available Help at Discharge: Family Type of Home: Apartment Home Access: Stairs to enter Entrance Stairs-Rails: Right Entrance Stairs-Number of Steps: 14-15 Home Layout: One level Home Equipment: None      Prior Function Level of Independence: Independent               Hand Dominance   Dominant Hand: Right    Extremity/Trunk Assessment   Upper Extremity Assessment Upper Extremity Assessment: Overall WFL for tasks assessed    Lower Extremity Assessment Lower Extremity Assessment: Overall WFL for tasks assessed    Cervical / Trunk Assessment Cervical / Trunk Assessment: Normal  Communication   Communication: Interpreter utilized;Other (comment)(spanish)  Cognition Arousal/Alertness: Awake/alert Behavior During Therapy: WFL for tasks assessed/performed Overall Cognitive Status: Within Functional Limits for tasks assessed  General Comments      Exercises     Assessment/Plan    PT Assessment Patent does not need any further PT services  PT Problem List         PT Treatment Interventions      PT Goals (Current goals can be found in the Care Plan section)       Frequency     Barriers to discharge        Co-evaluation               AM-PAC PT "6 Clicks" Mobility  Outcome Measure Help needed turning from your back to your side while in a flat bed without using bedrails?: None Help needed moving from lying on your back to sitting on the side of a flat bed without using bedrails?: None Help needed moving to and from a bed to a chair (including a wheelchair)?: None Help needed  standing up from a chair using your arms (e.g., wheelchair or bedside chair)?: None Help needed to walk in hospital room?: None Help needed climbing 3-5 steps with a railing? : None 6 Click Score: 24    End of Session   Activity Tolerance: Patient tolerated treatment well Patient left: in chair;with call bell/phone within reach Nurse Communication: Mobility status PT Visit Diagnosis: Other abnormalities of gait and mobility (R26.89)    Time: [id number] PT Time Calculation (min) (ACUTE ONLY): 23 min   Charges:   PT Evaluation $PT Eval Low Complexity: 1 Low         Lieutenant Diego PT, DPT 10:13 AM,08/04/19 [phone]

## 2019-08-04 NOTE — TOC Transition Note (Signed)
Transition of Care [facility]) - CM/SW Discharge Note   Patient Details  Name: Darvin Pynn MRN: [id number] Date of Birth: 1956/02/20  Transition of Care [facility]) CM/SW Contact:  Ross Ludwig, LCSW Phone Number: 08/04/2019, 5:57 PM   Clinical Narrative:    CSW was informed that patient will need liquid vancomycin.  Patient does not have insurance, CSW spoke to Chartered certified accountant, and she was able to assist patient with receiving his medication.  Pharmacy supervisor was able to teach patient how to draw up medication with syringe.  Patient did not express any other concerns or issues.   Final next level of care: Home/Self Care Barriers to Discharge: Barriers Resolved   Patient Goals and CMS Choice Patient states their goals for this hospitalization and ongoing recovery are:: To return back home CMS Medicare.gov Compare Post Acute Care list provided to:: Patient Choice offered to / list presented to : Patient  Discharge Placement  Patient discharging back home.                     Discharge Plan and Services                DME Arranged: N/A DME Agency: NA       HH Arranged: NA HH Agency: NA        Social Determinants of Health (SDOH) Interventions     Readmission Risk Interventions No flowsheet data found.

## 2019-08-04 NOTE — Discharge Summary (Signed)
Physician Discharge Summary  Danny [name] [id number] DOB: 12-18-1955 DOA: 08/01/2019  PCP: Patient, No Pcp Per  Admit date: 08/01/2019 Discharge date: 08/04/2019  Recommendations for Outpatient Follow-up:  1. Follow up with PCP in 7-10 days. 2. Keep dialysis appointments. 3. Follow up with Nephrology as directed.  Discharge Diagnoses: Principal diagnosis is #1 1. C. Diff colitis 2. Diarrhea 3. Metabolic acidosis 4. ESRD on HD 5. Hyperkalemia 6. Volume overload 7. Chronic combined systolic and diastolic failure  Discharge Condition: Fair  Disposition: Home  Diet recommendation: Heart healthy  Filed Weights   08/02/19 0157 08/03/19 0405 08/04/19 0605  Weight: 101.4 kg 103.6 kg 102.9 kg    History of present illness:   Danny [name] is a 63 y.o. male with medical history significant for diabetes mellitus, hypertension and end-stage renal disease who approximately 2 weeks ago started having continuous diarrhea.  Diarrhea is described as no associated abdominal pain no improvement when he quit eating, no associated nausea but just loose watery liquid bowel movements occurring quite frequently at least 7-10 times a day.  No one else at home sick.  Patient denies any exposure to Covid.  Patient felt so weak and embarrassed after going to dialysis and having continuous diarrhea there, that he stopped going as of his last session being Tuesday, 10/27.  Patient decided come the ER today because he felt very weak like he could barely walk.  ED Course: In the emergency room, patient's labs consistent with end-stage renal disease and missing dialysis with a potassium of 7.4, creatinine of 12.81 with a BUN of 130 and the rest of his labs really unremarkable.  High-sensitivity troponin at 118.  Patient was given multiple doses of calcium carbonate to protect his heart.  EKG was unrevealing.  He also received 1 dose of potassium binding medication.  Hospitalist called,  nephrology  was consulted, but patient however refused to go to dialysis because of his concerns for his severe diarrhea.  When explained to him that a work-up from GI would only happen once his renal function was stabilized, patient was quite adamant that he would rather go home and understanding that he would die if this happened.  We were able to come to a compromise when patient received large doses of Bentyl and Imodium which seems to have stopped his diarrhea in the short-term interval.  Patient then relented to dialysis.  GI in the meantime has been consulted.  Covid test was negative.  Hospital Course:  [name] is a 63 yr old man who carries a past medical history significant for DM II, HTN, and ESRD on HD. He presented to [facility] ED on 08/01/2019 with complaints of 2 weeks of diarrhea. The patient was unable to sit for dialysis due to volume depletion and weakness due to volume depletion.  He was admitted to a telemetry bed. Nephrology was consulted. He was negative for C Diff colitis. GI was also consulted. He has responded to treatment with lomotil and has undergone HD x 2.   The patient's vitals reveal severe hypertension. The patient states that he had one BM this morning and that it was not loose. He is feeling better. Laboratory reveals elevated PTH.  He has been cleared for discharge to home.  Today's assessment: S: The patient is sitting up at bedside. He is ready to go home. O: Vitals:  Vitals:   08/04/19 0837 08/04/19 1718  BP: (!) 178/88 (!) 159/83  Pulse: 73 64  Resp:    Temp:  97.8 F (36.6 C)  SpO2:  95%   Exam:  Constitutional:   The patient is awake, alert, and oriented x 3. No acute distress. Respiratory:   No increased work of breathing.  No wheezes, rales, or rhonchi  No tactile fremitus Cardiovascular:   Regular rate and rhythm  No murmurs, ectopy, or gallups.  No lateral PMI. No thrills. Abdomen:   Abdomen is soft, non-tender,  non-distended  No hernias, masses, or organomegaly  Normoactive bowel sounds.  Musculoskeletal:   No cyanosis, clubbing, or edema Skin:   No rashes, lesions, ulcers  palpation of skin: no induration or nodules Neurologic:   CN 2-12 intact  Sensation all 4 extremities intact Psychiatric:   Mental status o Mood, affect appropriate o Orientation to person, place, time   judgment and insight appear intact    Discharge Instructions  Discharge Instructions    Activity as tolerated - No restrictions   Complete by: As directed    Call MD for:  persistant nausea and vomiting   Complete by: As directed    Call MD for:  severe uncontrolled pain   Complete by: As directed    Call MD for:  temperature >100.4   Complete by: As directed    Diet - low sodium heart healthy   Complete by: As directed    Diet Carb Modified   Complete by: As directed    Discharge instructions   Complete by: As directed    Follow up with PCP in 7-10 days. Keep dialysis appointments. Follow up with Nephrology as directed.   Increase activity slowly   Complete by: As directed      Allergies as of 08/04/2019   No Known Allergies     Medication List    STOP taking these medications   liraglutide 18 MG/3ML Sopn Commonly known as: VICTOZA     TAKE these medications   albuterol 108 (90 Base) MCG/ACT inhaler Commonly known as: VENTOLIN HFA Inhale 2 puffs into the lungs every 6 (six) hours as needed for wheezing.   amLODipine 5 MG tablet Commonly known as: NORVASC Take 5 mg by mouth at bedtime.   calcium acetate 667 MG capsule Commonly known as: PHOSLO Take 1,334-2,668 mg by mouth See admin instructions. Take 4 capsules (2668MG ) by mouth 3 times daily with meals and 2 capsules (1334MG ) by mouth 2 times daily with snacks   carvedilol 3.125 MG tablet Commonly known as: COREG Take 1 tablet (3.125 mg total) by mouth 2 (two) times daily with a meal.   citalopram 20 MG tablet Commonly  known as: CELEXA Take 20 mg by mouth daily.   feeding supplement (NEPRO CARB STEADY) Liqd Take 237 mLs by mouth 2 (two) times daily between meals. Start taking on: August 05, 2019   gabapentin 300 MG capsule Commonly known as: NEURONTIN Take 300 mg by mouth daily.   insulin aspart protamine- aspart (70-30) 100 UNIT/ML injection Commonly known as: NOVOLOG MIX 70/30 Inject 10-12 Units into the skin See admin instructions. Inject 12u under the skin every morning and 10u under the skin at night   loperamide 2 MG capsule Commonly known as: IMODIUM Take 1 capsule (2 mg total) by mouth every 6 (six) hours as needed for diarrhea or loose stools.   multivitamin Tabs tablet Take 1 tablet by mouth at bedtime.   omeprazole 20 MG tablet Commonly known as: PRILOSEC OTC Take 20 mg by mouth daily.   sacubitril-valsartan 24-26 MG  Commonly known as: ENTRESTO Take 1 tablet by mouth 2 (two) times daily.   sevelamer carbonate 800 MG tablet Commonly known as: RENVELA Take 3,200 mg by mouth 3 (three) times daily with meals.   vancomycin 125 MG capsule Commonly known as: VANCOCIN Take 1 capsule (125 mg total) by mouth 4 (four) times daily.      No Known Allergies  The results of significant diagnostics from this hospitalization (including imaging, microbiology, ancillary and laboratory) are listed below for reference.    Significant Diagnostic Studies: Ct Head Wo Contrast  Result Date: 08/02/2019 CLINICAL DATA:  Headaches following soccer injury 1 week ago, initial encounter EXAM: CT HEAD WITHOUT CONTRAST TECHNIQUE: Contiguous axial images were obtained from the base of the skull through the vertex without intravenous contrast. COMPARISON:  12/06/2018 FINDINGS: Brain: Stable cerebellar atrophy is noted. Mild cerebral atrophy is seen as well. No findings to suggest acute hemorrhage, acute infarction or space-occupying mass lesion noted. Vascular: No hyperdense vessel or unexpected  calcification. Skull: Normal. Negative for fracture or focal lesion. Sinuses/Orbits: No acute finding. Other: None IMPRESSION: Chronic changes without acute abnormality. Electronically Signed   By: Inez Catalina M.D.   On: 08/02/2019 13:48   Dg Chest Portable 1 View  Result Date: 08/01/2019 CLINICAL DATA:  Short of breath EXAM: PORTABLE CHEST 1 VIEW COMPARISON:  May 29, 2019 FINDINGS: There is cardiomegaly with pulmonary venous hypertension. There is mild interstitial edema. There is mild atelectatic change in the left mid lung. There is no consolidation. No adenopathy. There is aortic atherosclerosis. No bone lesions. IMPRESSION: Cardiomegaly with pulmonary vascular congestion. Mild interstitial edema. Mild atelectasis left mid lung. No consolidation or pleural effusion. Aortic Atherosclerosis (ICD10-I70.0). Electronically Signed   By: Lowella Grip III M.D.   On: 08/01/2019 08:51    Microbiology: Recent Results (from the past 240 hour(s))  SARS Coronavirus 2 by RT PCR (hospital order, performed in [facility] hospital lab) Nasopharyngeal Nasopharyngeal Swab     Status: None   Collection Time: 08/01/19 10:26 AM   Specimen: Nasopharyngeal Swab  Result Value Ref Range Status   SARS Coronavirus 2 NEGATIVE NEGATIVE Final    Comment: (NOTE) If result is NEGATIVE SARS-CoV-2 target nucleic acids are NOT DETECTED. The SARS-CoV-2 RNA is generally detectable in upper and lower  respiratory specimens during the acute phase of infection. The lowest  concentration of SARS-CoV-2 viral copies this assay can detect is 250  copies / mL. A negative result does not preclude SARS-CoV-2 infection  and should not be used as the sole basis for treatment or other  patient management decisions.  A negative result may occur with  improper specimen collection / handling, submission of specimen other  than nasopharyngeal swab, presence of viral mutation(s) within the  areas targeted by this assay, and inadequate  number of viral copies  (<250 copies / mL). A negative result must be combined with clinical  observations, patient history, and epidemiological information. If result is POSITIVE SARS-CoV-2 target nucleic acids are DETECTED. The SARS-CoV-2 RNA is generally detectable in upper and lower  respiratory specimens dur ing the acute phase of infection.  Positive  results are indicative of active infection with SARS-CoV-2.  Clinical  correlation with patient history and other diagnostic information is  necessary to determine patient infection status.  Positive results do  not rule out bacterial infection or co-infection with other viruses. If result is PRESUMPTIVE POSTIVE SARS-CoV-2 nucleic acids MAY BE PRESENT.   A presumptive positive result was obtained  on the submitted specimen  and confirmed on repeat testing.  While 2019 novel coronavirus  (SARS-CoV-2) nucleic acids may be present in the submitted sample  additional confirmatory testing may be necessary for epidemiological  and / or clinical management purposes  to differentiate between  SARS-CoV-2 and other Sarbecovirus currently known to infect humans.  If clinically indicated additional testing with an alternate test  methodology [phone]) is advised. The SARS-CoV-2 RNA is generally  detectable in upper and lower respiratory sp ecimens during the acute  phase of infection. The expected result is Negative. Fact Sheet for Patients:  StrictlyIdeas.no Fact Sheet for Healthcare Providers: BankingDealers.co.za This test is not yet approved or cleared by the [place] FDA and has been authorized for detection and/or diagnosis of SARS-CoV-2 by FDA under an Emergency Use Authorization (EUA).  This EUA will remain in effect (meaning this test can be used) for the duration of the COVID-19 declaration under Section 564(b)(1) of the Act, 21 U.S.C. section 360bbb-3(b)(1), unless the  authorization is terminated or revoked sooner. Performed at [facility], [address] [place], [place] [address]   Stool culture (children & immunocomp patients)     Status: None (Preliminary result)   Collection Time: 08/01/19  5:09 PM   Specimen: Stool  Result Value Ref Range Status   Salmonella/Shigella Screen Final report  Final   Campylobacter Culture PENDING  Incomplete   E coli, Shiga toxin Assay Negative Negative Final    Comment: (NOTE) Performed At: [facility] [place], [place] [id number] Rush Farmer MD [id number]   C difficile quick scan w PCR reflex     Status: Abnormal   Collection Time: 08/01/19  5:09 PM   Specimen: STOOL  Result Value Ref Range Status   C Diff antigen POSITIVE (A) NEGATIVE Final   C Diff toxin POSITIVE (A) NEGATIVE Final   C Diff interpretation Toxin producing C. difficile detected.  Final    Comment: CRITICAL RESULT CALLED TO, READ BACK BY AND VERIFIED WITH: Adriana Mccallum 08/02/19 @ 1527  [place] Performed at [facility], [address] [place], [place] [address]   STOOL CULTURE REFLEX - RSASHR     Status: None   Collection Time: 08/01/19  5:09 PM  Result Value Ref Range Status   Stool Culture result 1 (RSASHR) Comment  Final    Comment: (NOTE) No Salmonella or Shigella recovered. Performed At: [facility] [place], [place] [id number] Rush Farmer MD [id number]   MRSA PCR Screening     Status: None   Collection Time: 08/02/19 10:10 AM   Specimen: Nasopharyngeal  Result Value Ref Range Status   MRSA by PCR NEGATIVE NEGATIVE Final    Comment:        The GeneXpert MRSA Assay (FDA approved for NASAL specimens only), is one component of a comprehensive MRSA colonization surveillance program. It is not intended to diagnose MRSA infection nor to guide or monitor treatment for MRSA infections. Performed at [facility], [address]  [place], [place] [address]      Labs: Basic Metabolic Panel: Recent Labs  Lab 08/01/19 0810 08/01/19 1710 08/02/19 0609 08/03/19 1820 08/04/19 0527  NA 137  --  139  --  140  K 7.4* 4.7 5.8*  --  4.8  CL 98  --  96*  --  100  CO2 17*  --  28  --  27  GLUCOSE 111*  --  108*  --  128*  BUN 130*  --  80*  --  46*  CREATININE 12.81*  --  9.35*  --  6.69*  CALCIUM 9.3  --  8.7*  --  8.9  PHOS  --  7.1*  --  9.1*  --    Liver Function Tests: Recent Labs  Lab 08/01/19 0810 08/02/19 0609 08/04/19 0527  AST 18 17 13*  ALT 18 15 12   ALKPHOS 166* 125 120  BILITOT 1.2 1.0 0.7  PROT 8.1 6.8 6.7  ALBUMIN 4.3 3.4* 3.3*   Recent Labs  Lab 08/01/19 0810  LIPASE 37   No results for input(s): AMMONIA in the last 168 hours. CBC: Recent Labs  Lab 08/01/19 0810 08/02/19 0609 08/04/19 0527  WBC 6.8 4.2 2.9*  NEUTROABS  --   --  1.9  HGB 13.3 10.7* 10.9*  HCT 41.9 32.8* 33.1*  MCV 94.4 91.9 93.2  PLT 154 107* 96*   Cardiac Enzymes: No results for input(s): CKTOTAL, CKMB, CKMBINDEX, TROPONINI in the last 168 hours. BNP: BNP (last 3 results) Recent Labs    05/28/19 0545  BNP 4,023.0*    ProBNP (last 3 results) No results for input(s): PROBNP in the last 8760 hours.  CBG: Recent Labs  Lab 08/03/19 1727 08/03/19 2131 08/04/19 0827 08/04/19 1209 08/04/19 1719  GLUCAP 137* 95 76 126* 109*    Principal Problem:   Volume overload Active Problems:   Hyperkalemia   Acute diarrhea   Diabetes mellitus with ESRD (end-stage renal disease) (HCC)   Hypertension   Chronic combined systolic and diastolic CHF (congestive heart failure) ([place])   Diarrhea   Time coordinating discharge: 38 minutes  Signed:        Shariff Lasky, DO [facility]  08/04/2019, 6:51 PM

## 2019-08-06 LAB — STOOL CULTURE: E coli, Shiga toxin Assay: NEGATIVE

## 2019-08-06 LAB — STOOL CULTURE REFLEX - RSASHR

## 2019-08-06 LAB — STOOL CULTURE REFLEX - CMPCXR

## 2019-08-29 ENCOUNTER — Ambulatory Visit: Admit: 2019-08-29 | Discharge: 2019-08-30

## 2019-09-07 MED ORDER — SEVELAMER CARBONATE 800 MG TABLET
ORAL_TABLET | Freq: Three times a day (TID) | ORAL | 5 refills | 45.00000 days | Status: CP
Start: 2019-09-07 — End: ?

## 2019-09-26 MED ORDER — SEVELAMER CARBONATE 800 MG TABLET
ORAL_TABLET | 11 refills | 0 days
Start: 2019-09-26 — End: ?

## 2019-09-29 ENCOUNTER — Ambulatory Visit: Admit: 2019-09-29 | Discharge: 2019-09-30

## 2019-10-03 ENCOUNTER — Ambulatory Visit: Payer: Self-pay | Attending: Internal Medicine

## 2019-10-10 MED ORDER — CALCIUM ACETATE(PHOSPHATE BINDERS) 667 MG CAPSULE
ORAL_CAPSULE | Freq: Three times a day (TID) | ORAL | 11 refills | 30 days | Status: CP
Start: 2019-10-10 — End: 2020-10-09

## 2019-10-30 ENCOUNTER — Ambulatory Visit: Admit: 2019-10-30 | Discharge: 2019-10-31

## 2019-11-23 ENCOUNTER — Encounter: Payer: Self-pay | Admitting: Emergency Medicine

## 2019-11-23 ENCOUNTER — Emergency Department: Payer: Self-pay

## 2019-11-23 ENCOUNTER — Non-Acute Institutional Stay
Admission: EM | Admit: 2019-11-23 | Discharge: 2019-11-23 | Disposition: A | Discharge status: Home / Self Care | Payer: Self-pay | Attending: Emergency Medicine | Admitting: Emergency Medicine

## 2019-11-23 ENCOUNTER — Other Ambulatory Visit: Payer: Self-pay

## 2019-11-23 DIAGNOSIS — D631 Anemia in chronic kidney disease: Secondary | ICD-10-CM | POA: Insufficient documentation

## 2019-11-23 DIAGNOSIS — Z20822 Contact with and (suspected) exposure to covid-19: Secondary | ICD-10-CM | POA: Insufficient documentation

## 2019-11-23 DIAGNOSIS — R197 Diarrhea, unspecified: Secondary | ICD-10-CM | POA: Insufficient documentation

## 2019-11-23 DIAGNOSIS — E1122 Type 2 diabetes mellitus with diabetic chronic kidney disease: Secondary | ICD-10-CM | POA: Insufficient documentation

## 2019-11-23 DIAGNOSIS — E875 Hyperkalemia: Secondary | ICD-10-CM | POA: Insufficient documentation

## 2019-11-23 DIAGNOSIS — N2581 Secondary hyperparathyroidism of renal origin: Secondary | ICD-10-CM | POA: Insufficient documentation

## 2019-11-23 DIAGNOSIS — Z992 Dependence on renal dialysis: Secondary | ICD-10-CM | POA: Insufficient documentation

## 2019-11-23 DIAGNOSIS — N186 End stage renal disease: Secondary | ICD-10-CM | POA: Insufficient documentation

## 2019-11-23 DIAGNOSIS — I5042 Chronic combined systolic (congestive) and diastolic (congestive) heart failure: Secondary | ICD-10-CM | POA: Insufficient documentation

## 2019-11-23 DIAGNOSIS — I132 Hypertensive heart and chronic kidney disease with heart failure and with stage 5 chronic kidney disease, or end stage renal disease: Secondary | ICD-10-CM | POA: Insufficient documentation

## 2019-11-23 LAB — CBC WITH DIFFERENTIAL/PLATELET
Abs Immature Granulocytes: 0.01 10*3/uL (ref 0.00–0.07)
Basophils Absolute: 0 10*3/uL (ref 0.0–0.1)
Basophils Relative: 1 %
Eosinophils Absolute: 0.1 10*3/uL (ref 0.0–0.5)
Eosinophils Relative: 3 %
HCT: 31.8 % — ABNORMAL LOW (ref 39.0–52.0)
Hemoglobin: 10.4 g/dL — ABNORMAL LOW (ref 13.0–17.0)
Immature Granulocytes: 0 %
Lymphocytes Relative: 15 %
Lymphs Abs: 0.7 10*3/uL (ref 0.7–4.0)
MCH: 31.6 pg (ref 26.0–34.0)
MCHC: 32.7 g/dL (ref 30.0–36.0)
MCV: 96.7 fL (ref 80.0–100.0)
Monocytes Absolute: 0.6 10*3/uL (ref 0.1–1.0)
Monocytes Relative: 13 %
Neutro Abs: 3.3 10*3/uL (ref 1.7–7.7)
Neutrophils Relative %: 68 %
Platelets: 142 10*3/uL — ABNORMAL LOW (ref 150–400)
RBC: 3.29 MIL/uL — ABNORMAL LOW (ref 4.22–5.81)
RDW: 14.6 % (ref 11.5–15.5)
WBC: 4.8 10*3/uL (ref 4.0–10.5)
nRBC: 0 % (ref 0.0–0.2)

## 2019-11-23 LAB — COMPREHENSIVE METABOLIC PANEL
ALT: 16 U/L (ref 0–44)
AST: 21 U/L (ref 15–41)
Albumin: 3.9 g/dL (ref 3.5–5.0)
Alkaline Phosphatase: 160 U/L — ABNORMAL HIGH (ref 38–126)
Anion gap: 14 (ref 5–15)
BUN: 96 mg/dL — ABNORMAL HIGH (ref 8–23)
CO2: 27 mmol/L (ref 22–32)
Calcium: 9.3 mg/dL (ref 8.9–10.3)
Chloride: 93 mmol/L — ABNORMAL LOW (ref 98–111)
Creatinine, Ser: 9.28 mg/dL — ABNORMAL HIGH (ref 0.61–1.24)
GFR calc Af Amer: 6 mL/min — ABNORMAL LOW (ref >60)
GFR calc non Af Amer: 5 mL/min — ABNORMAL LOW (ref >60)
Glucose, Bld: 123 mg/dL — ABNORMAL HIGH (ref 70–99)
Potassium: 6.7 mmol/L (ref 3.5–5.1)
Sodium: 134 mmol/L — ABNORMAL LOW (ref 135–145)
Total Bilirubin: 1 mg/dL (ref 0.3–1.2)
Total Protein: 7.8 g/dL (ref 6.5–8.1)

## 2019-11-23 LAB — HEPATITIS B SURFACE ANTIGEN: Hepatitis B Surface Ag: NONREACTIVE

## 2019-11-23 LAB — BRAIN NATRIURETIC PEPTIDE: B Natriuretic Peptide: 3647 pg/mL — ABNORMAL HIGH (ref 0.0–100.0)

## 2019-11-23 LAB — POC SARS CORONAVIRUS 2 AG: SARS Coronavirus 2 Ag: NEGATIVE

## 2019-11-23 LAB — TROPONIN I (HIGH SENSITIVITY): Troponin I (High Sensitivity): 34 ng/L — ABNORMAL HIGH (ref <18)

## 2019-11-23 LAB — LIPASE, BLOOD: Lipase: 43 U/L (ref 11–51)

## 2019-11-23 MED ORDER — CALCIUM GLUCONATE-NACL 1-0.675 GM/50ML-% IV SOLN
1.0000 g | Freq: Once | INTRAVENOUS | Status: AC
  Administered 2019-11-23: 1000 mg via INTRAVENOUS
  Filled 2019-11-23: qty 50

## 2019-11-23 MED ORDER — CARVEDILOL 6.25 MG PO TABS
6.2500 mg | ORAL_TABLET | Freq: Once | ORAL | Status: AC
  Administered 2019-11-23: 6.25 mg via ORAL
  Filled 2019-11-23: qty 1

## 2019-11-23 MED ORDER — SODIUM CHLORIDE 0.9 % IV SOLN
1.0000 g | Freq: Once | INTRAVENOUS | Status: DC
  Filled 2019-11-23: qty 10

## 2019-11-23 MED ORDER — INSULIN ASPART 100 UNIT/ML ~~LOC~~ SOLN
10.0000 [IU] | Freq: Once | SUBCUTANEOUS | Status: AC
  Administered 2019-11-23: 10 [IU] via INTRAVENOUS
  Filled 2019-11-23: qty 1

## 2019-11-23 MED ORDER — DEXTROSE 50 % IV SOLN
25.0000 g | Freq: Once | INTRAVENOUS | Status: AC
  Administered 2019-11-23: 12:00:00 25 g via INTRAVENOUS
  Filled 2019-11-23: qty 50

## 2019-11-23 NOTE — ED Provider Notes (Signed)
[facility] Emergency Department Provider Note       Time seen: ----------------------------------------- 10:36 AM on 11/23/2019 -----------------------------------------   I have reviewed the triage vital signs and the nursing notes.  HISTORY   Chief Complaint Shortness of Breath    HPI Danny [name] is a 64 y.o. male with a history of diabetes, dialysis, gout, hypertension who presents to the ED for shortness of breath.  Patient missed dialysis on Monday and has not had it yet today.  Patient states he had a lot of diarrhea on Monday and reports 10 episodes of diarrhea in the last 24 hours, denies vomiting.  Past Medical History:  Diagnosis Date  . Diabetes mellitus without complication ([place])   . Dialysis patient [facility])    tuesday, thursday and saturday  . Gout   . Hypertension   . Kidney disease     Patient Active Problem List   Diagnosis Date Noted  . Diabetes mellitus with ESRD (end-stage renal disease) ([place]) 08/01/2019  . Diarrhea 08/01/2019  . Hypertension   . Chronic combined systolic and diastolic CHF (congestive heart failure) ([place])   . Volume overload   . Acute diarrhea 07/27/2018  . Viral gastroenteritis 10/04/2016  . Anemia 10/04/2016  . Thrombocytopenia ([place]) 10/04/2016  . Rectal bleeding 10/04/2016  . Hyperkalemia 10/02/2016  . Pulmonary edema 05/13/2016    Past Surgical History:  Procedure Laterality Date  . AV FISTULA PLACEMENT    . miscellaneous     peritoneal dialysis catheter placement and removal    Allergies Patient has no known allergies.  Social History Social History   Tobacco Use  . Smoking status: Never Smoker  . Smokeless tobacco: Never Used  Substance Use Topics  . Alcohol use: No  . Drug use: No    Review of Systems Constitutional: Negative for fever. Cardiovascular: Negative for chest pain. Respiratory: Positive shortness of breath Gastrointestinal: Negative for abdominal pain, positive for  diarrhea Musculoskeletal: Negative for back pain. Skin: Negative for rash. Neurological: Negative for headaches, focal weakness or numbness.  All systems negative/normal/unremarkable except as stated in the HPI  ____________________________________________   PHYSICAL EXAM:  VITAL SIGNS: ED Triage Vitals  Enc Vitals Group     BP 11/23/19 1028 (!) 167/88     Pulse Rate 11/23/19 1028 66     Resp 11/23/19 1028 18     Temp 11/23/19 1028 98 F (36.7 C)     Temp Source 11/23/19 1028 Oral     SpO2 11/23/19 1028 97 %     Weight 11/23/19 1029 207 lb 3.7 oz (94 kg)     Height 11/23/19 1029 5\' 8"  (1.727 m)     Head Circumference --      Peak Flow --      Pain Score 11/23/19 1029 0     Pain Loc --      Pain Edu? --      Excl. in [place]? --    Constitutional: Alert and oriented.  Chronically ill-appearing, no distress Cardiovascular: Normal rate, regular rhythm. No murmurs, rubs, or gallops. Respiratory: Normal respiratory effort without tachypnea nor retractions. Breath sounds are clear and equal bilaterally. No wheezes/rales/rhonchi. Gastrointestinal: Soft and nontender. Normal bowel sounds Musculoskeletal: Nontender with normal range of motion in extremities. No lower extremity tenderness nor edema. Neurologic:  Normal speech and language. No gross focal neurologic deficits are appreciated.  Skin:  Skin is warm, dry and intact. No rash noted. Psychiatric: Mood and affect are normal. Speech and  behavior are normal.  ____________________________________________  EKG: Interpreted by me.  Sinus rhythm with first-degree AV block, left bundle branch block, rate is 64 beats a minute  ____________________________________________  ED COURSE:  As part of my medical decision making, I reviewed the following data within the [place] History obtained from family if available, nursing notes, old chart and ekg, as well as notes from prior ED visits. Patient presented for  shortness of breath with recent diarrhea, we will assess with labs and imaging as indicated at this time.   Procedures  Danny [name] was evaluated in Emergency Department on 11/23/2019 for the symptoms described in the history of present illness. He was evaluated in the context of the global COVID-19 pandemic, which necessitated consideration that the patient might be at risk for infection with the SARS-CoV-2 virus that causes COVID-19. Institutional protocols and algorithms that pertain to the evaluation of patients at risk for COVID-19 are in a state of rapid change based on information released by regulatory bodies including the CDC and federal and state organizations. These policies and algorithms were followed during the patient's care in the ED.  ____________________________________________   LABS (pertinent positives/negatives)  Labs Reviewed  COMPREHENSIVE METABOLIC PANEL - Abnormal; Notable for the following components:      Result Value   Sodium 134 (*)    Potassium 6.7 (*)    Chloride 93 (*)    Glucose, Bld 123 (*)    BUN 96 (*)    Creatinine, Ser 9.28 (*)    Alkaline Phosphatase 160 (*)    GFR calc non Af Amer 5 (*)    GFR calc Af Amer 6 (*)    All other components within normal limits  CBC WITH DIFFERENTIAL/PLATELET - Abnormal; Notable for the following components:   RBC 3.29 (*)    Hemoglobin 10.4 (*)    HCT 31.8 (*)    Platelets 142 (*)    All other components within normal limits  BRAIN NATRIURETIC PEPTIDE - Abnormal; Notable for the following components:   B Natriuretic Peptide 3,647.0 (*)    All other components within normal limits  C DIFFICILE QUICK SCREEN W PCR REFLEX  GI PATHOGEN PANEL BY PCR, STOOL  LIPASE, BLOOD  URINALYSIS, COMPLETE (UACMP) WITH MICROSCOPIC  POC SARS CORONAVIRUS 2 AG -  ED  TROPONIN I (HIGH SENSITIVITY)   CRITICAL CARE Performed by: Laurence Aly   Total critical care time: 30 minutes  Critical care time was exclusive of  separately billable procedures and treating other patients.  Critical care was necessary to treat or prevent imminent or life-threatening deterioration.  Critical care was time spent personally by me on the following activities: development of treatment plan with patient and/or surrogate as well as nursing, discussions with consultants, evaluation of patient's response to treatment, examination of patient, obtaining history from patient or surrogate, ordering and performing treatments and interventions, ordering and review of laboratory studies, ordering and review of radiographic studies, pulse oximetry and re-evaluation of patient's condition.  RADIOLOGY Images were viewed by me  Chest x-ray  IMPRESSION:  Cardiomegaly and central vascular congestion. No overt signs of  edema, no effusion or consolidation.  ____________________________________________   DIFFERENTIAL DIAGNOSIS   Pulmonary edema, CHF, volume overload, end-stage renal disease on dialysis, electrolyte abnormality  FINAL ASSESSMENT AND PLAN  Shortness of breath, diarrhea   Plan: The patient had presented for dyspnea with having recently missed dialysis and diarrhea. Patient's labs do reveal significant hyperkalemia and known  chronic renal failure on dialysis. Patient's imaging did reveal cardiomegaly and central vascular congestion with no obvious edema.  I have ordered insulin, D50, and calcium.  I discussed with nephrology for acute dialysis.   Laurence Aly, MD    Note: This note was generated in part or whole with voice recognition software. Voice recognition is usually quite accurate but there are transcription errors that can and very often do occur. I apologize for any typographical errors that were not detected and corrected.     Earleen Newport, MD 11/23/19 1139

## 2019-11-23 NOTE — Progress Notes (Signed)
[facility]  ROUNDING NOTE   Subjective:   Danny [name] admitted to [facility] on 11/23/2019 for Hyperkalemia [E87.5] ESRD on dialysis [facility]) [N18.6, Z99.2]  Patient has missed three outpatient dialysis treatments due to diarrhea. Presented to outpatient dialysis clinic this morning where he was asked to be evaluated by the emergency department.   Objective:  Vital signs in last 24 hours:  Temp:  [97.5 F (36.4 C)-98 F (36.7 C)] 97.5 F (36.4 C) (02/24 1440) Pulse Rate:  [59-66] 62 (02/24 1530) Resp:  [10-22] 15 (02/24 1530) BP: (157-173)/(70-88) 157/70 (02/24 1530) SpO2:  [97 %-100 %] 100 % (02/24 1440) Weight:  [94 kg-101.9 kg] 101.9 kg (02/24 1440)  Weight change:  Filed Weights   11/23/19 1029 11/23/19 1440  Weight: 94 kg 101.9 kg    Intake/Output: No intake/output data recorded.   Intake/Output this shift:  No intake/output data recorded.  Physical Exam: General: NAD, sitting in chair  Head: Normocephalic, atraumatic. Moist oral mucosal membranes  Eyes: Anicteric, PERRL  Neck: Supple, trachea midline  Lungs:  Clear to auscultation  Heart: Regular rate and rhythm  Abdomen:  Soft, nontender,   Extremities:  no peripheral edema.  Neurologic: Nonfocal, moving all four extremities  Skin: No lesions  Access: Left AVF    Basic Metabolic Panel: Recent Labs  Lab 11/23/19 1033  NA 134*  K 6.7*  CL 93*  CO2 27  GLUCOSE 123*  BUN 96*  CREATININE 9.28*  CALCIUM 9.3    Liver Function Tests: Recent Labs  Lab 11/23/19 1033  AST 21  ALT 16  ALKPHOS 160*  BILITOT 1.0  PROT 7.8  ALBUMIN 3.9   Recent Labs  Lab 11/23/19 1033  LIPASE 43   No results for input(s): AMMONIA in the last 168 hours.  CBC: Recent Labs  Lab 11/23/19 1033  WBC 4.8  NEUTROABS 3.3  HGB 10.4*  HCT 31.8*  MCV 96.7  PLT 142*    Cardiac Enzymes: No results for input(s): CKTOTAL, CKMB, CKMBINDEX, TROPONINI in the last 168 hours.  BNP: Invalid input(s):  POCBNP  CBG: No results for input(s): GLUCAP in the last 168 hours.  Microbiology: Results for orders placed or performed during the hospital encounter of 08/01/19  SARS Coronavirus 2 by RT PCR (hospital order, performed in [facility] hospital lab) Nasopharyngeal Nasopharyngeal Swab     Status: None   Collection Time: 08/01/19 10:26 AM   Specimen: Nasopharyngeal Swab  Result Value Ref Range Status   SARS Coronavirus 2 NEGATIVE NEGATIVE Final    Comment: (NOTE) If result is NEGATIVE SARS-CoV-2 target nucleic acids are NOT DETECTED. The SARS-CoV-2 RNA is generally detectable in upper and lower  respiratory specimens during the acute phase of infection. The lowest  concentration of SARS-CoV-2 viral copies this assay can detect is 250  copies / mL. A negative result does not preclude SARS-CoV-2 infection  and should not be used as the sole basis for treatment or other  patient management decisions.  A negative result may occur with  improper specimen collection / handling, submission of specimen other  than nasopharyngeal swab, presence of viral mutation(s) within the  areas targeted by this assay, and inadequate number of viral copies  (<250 copies / mL). A negative result must be combined with clinical  observations, patient history, and epidemiological information. If result is POSITIVE SARS-CoV-2 target nucleic acids are DETECTED. The SARS-CoV-2 RNA is generally detectable in upper and lower  respiratory specimens dur ing the acute phase of  infection.  Positive  results are indicative of active infection with SARS-CoV-2.  Clinical  correlation with patient history and other diagnostic information is  necessary to determine patient infection status.  Positive results do  not rule out bacterial infection or co-infection with other viruses. If result is PRESUMPTIVE POSTIVE SARS-CoV-2 nucleic acids MAY BE PRESENT.   A presumptive positive result was obtained on the submitted specimen   and confirmed on repeat testing.  While 2019 novel coronavirus  (SARS-CoV-2) nucleic acids may be present in the submitted sample  additional confirmatory testing may be necessary for epidemiological  and / or clinical management purposes  to differentiate between  SARS-CoV-2 and other Sarbecovirus currently known to infect humans.  If clinically indicated additional testing with an alternate test  methodology [phone]) is advised. The SARS-CoV-2 RNA is generally  detectable in upper and lower respiratory sp ecimens during the acute  phase of infection. The expected result is Negative. Fact Sheet for Patients:  StrictlyIdeas.no Fact Sheet for Healthcare Providers: BankingDealers.co.za This test is not yet approved or cleared by the [place] FDA and has been authorized for detection and/or diagnosis of SARS-CoV-2 by FDA under an Emergency Use Authorization (EUA).  This EUA will remain in effect (meaning this test can be used) for the duration of the COVID-19 declaration under Section 564(b)(1) of the Act, 21 U.S.C. section 360bbb-3(b)(1), unless the authorization is terminated or revoked sooner. Performed at [facility], [address] [place], [place] [address]   Stool culture (children & immunocomp patients)     Status: None   Collection Time: 08/01/19  5:09 PM   Specimen: Stool  Result Value Ref Range Status   Salmonella/Shigella Screen Final report  Final   Campylobacter Culture Final report  Final   E coli, Shiga toxin Assay Negative Negative Final    Comment: (NOTE) Performed At: [facility] [place], [place] [id number] Rush Farmer MD [id number]   C difficile quick scan w PCR reflex     Status: Abnormal   Collection Time: 08/01/19  5:09 PM   Specimen: STOOL  Result Value Ref Range Status   C Diff antigen POSITIVE (A) NEGATIVE Final   C Diff toxin POSITIVE (A) NEGATIVE Final   C  Diff interpretation Toxin producing C. difficile detected.  Final    Comment: CRITICAL RESULT CALLED TO, READ BACK BY AND VERIFIED WITH: Adriana Mccallum 08/02/19 @ 1527  [place] Performed at [facility], [address] [place], [place] [address]   STOOL CULTURE REFLEX - RSASHR     Status: None   Collection Time: 08/01/19  5:09 PM  Result Value Ref Range Status   Stool Culture result 1 (RSASHR) Comment  Final    Comment: (NOTE) No Salmonella or Shigella recovered. Performed At: [facility] [address] [place], [place] [id number] Rush Farmer MD [id number]   STOOL CULTURE Reflex - CMPCXR     Status: None   Collection Time: 08/01/19  5:09 PM  Result Value Ref Range Status   Stool Culture result 1 (CMPCXR) Comment  Final    Comment: (NOTE) No Campylobacter species isolated. Performed At: [facility] [place], [place] [id number] Rush Farmer MD [id number]   MRSA PCR Screening     Status: None   Collection Time: 08/02/19 10:10 AM   Specimen: Nasopharyngeal  Result Value Ref Range Status   MRSA by PCR NEGATIVE NEGATIVE Final    Comment:  The GeneXpert MRSA Assay (FDA approved for NASAL specimens only), is one component of a comprehensive MRSA colonization surveillance program. It is not intended to diagnose MRSA infection nor to guide or monitor treatment for MRSA infections. Performed at [facility], [address] [place],  [address]     Coagulation Studies: No results for input(s): LABPROT, INR in the last 72 hours.  Urinalysis: No results for input(s): COLORURINE, LABSPEC, PHURINE, GLUCOSEU, HGBUR, BILIRUBINUR, KETONESUR, PROTEINUR, UROBILINOGEN, NITRITE, LEUKOCYTESUR in the last 72 hours.  Invalid input(s): APPERANCEUR    Imaging: DG Chest Port 1 View  Result Date: 11/23/2019 CLINICAL DATA:  Dyspnea, presenting to the emergency department with shortness of breath, history of dialysis.  EXAM: PORTABLE CHEST 1 VIEW COMPARISON:  08/01/2019 FINDINGS: Cardiomediastinal contours remain enlarged with signs of aortic atherosclerosis. Fullness of hilar structures with vascular congestion. No signs of definitive interstitial edema or evidence of pleural effusion. No signs of consolidation. Visualized skeletal structures are unremarkable. IMPRESSION: Cardiomegaly and central vascular congestion. No overt signs of edema, no effusion or consolidation. Electronically Signed   By: Zetta Bills M.D.   On: 11/23/2019 11:16     Medications:       Assessment/ Plan:  Danny [name] is a 64 y.o. Hispanic male with end stage renal disease on hemodialysis, insulin dependent diabetes mellitus type 2, gout, hypertension, anemia of chronic kidney disease, secondary hyperparathyroidism,depression,   MWF [facility] [place] Left AVF  1.  ESRD on HD MWF with hyperkalemia: missed 3 outpatient dialysis treatments due to diarrhea.  Now requiring emergency hemodialysis.  2.  Secondary hyperparathyroidism - sevelamer and calcium acetate with meals.   3.  Diarrhea: stool studies sent. C. Diff positive on 08/01/2019.   4.  Anemia of chronic kidney disease.  Hemoglobin 10.4. Mircera as an outpatient.   5. Hypertension: 170/70. Home regimen of Entresto, carvedilol, amlodipine.    LOS: 0 Danny [name] 2/24/20214:04 PM

## 2019-11-23 NOTE — ED Provider Notes (Signed)
I was asked to evaluate the patient after HD today. He has a history of C. Diff and has been having multiple loose stools per day. From a HD perspective, he feels much improved and is stable for d/c. Will check a C. Diff, send GIPanel. On my exam, abd is soft, NT, ND. Do not suspect perforation, sepsis, or obstruction. He is tolerating PO without difficulty.   After several hours in the ED, pt was unable to provide stool sample. He has been cleared from a nephro perspective. Serial abd exams are benign. I had a long discussion with pt re: plan. Given that he is otherwise very well appearing and in NAD, with soft abdomen, and inability to provide stool sample here, feel it's reasonable to provide outpt sample. He was provided with hat and cup for this and family was instructed to call PCP for outpt ordering.   Otherwise, pt has been dialyzed, is HDS, well appearing, and without complaints.       Duffy Bruce, MD 11/24/19 [phone]

## 2019-11-23 NOTE — ED Notes (Signed)
Pt given meal tray.

## 2019-11-23 NOTE — ED Notes (Signed)
Pt's daughter updated.

## 2019-11-23 NOTE — Discharge Instructions (Addendum)
CALL YOUR REGULAR/PRIMARY DOCTOR TOMORROW MORNING, TO ORDER AN OUTPATIENT STOOL TEST  IN THE MORNING, USE THE MATERIALS WE PROVIDED TO COLLECT A STOOL SAMPLE  CALL YOUR DOCTOR TO TAKE THIS TO A LOCAL LAB  GO TO DIALYSIS AS DISCUSSED

## 2019-11-23 NOTE — ED Triage Notes (Signed)
Patient presents to the ED from home for shortness of breath.  Patient is a M/W/F dialysis patient and he missed dialysis Monday and has not had it yet today.  Patient states he had a lot of diarrhea on Monday and reports diarrhea x 10 in the past 24 hours.  Patient denies vomiting.  Patient denies cough.  Patient is in no obvious distress at this time.

## 2019-11-27 ENCOUNTER — Ambulatory Visit: Admit: 2019-11-27 | Discharge: 2019-11-28

## 2019-11-27 LAB — GI PATHOGEN PANEL BY PCR, STOOL

## 2019-12-28 ENCOUNTER — Ambulatory Visit: Admit: 2019-12-28 | Discharge: 2019-12-29

## 2020-01-24 MED FILL — NOVOLOG MIX 70-30 FLEXPEN U-100 INSULIN 100 UNIT/ML SUBCUTANEOUS PEN: 100 days supply | Qty: 15 | Fill #1 | Status: AC

## 2020-01-24 MED FILL — CALCIUM ACETATE(PHOSPHATE BINDERS) 667 MG CAPSULE: ORAL | 30 days supply | Qty: 270 | Fill #0

## 2020-01-24 MED FILL — GABAPENTIN 300 MG CAPSULE: 30 days supply | Qty: 30 | Fill #1 | Status: AC

## 2020-01-24 MED FILL — SEVELAMER CARBONATE 800 MG TABLET: 30 days supply | Qty: 450 | Fill #0 | Status: AC

## 2020-01-24 MED FILL — SEVELAMER CARBONATE 800 MG TABLET: 30 days supply | Qty: 450 | Fill #0

## 2020-01-24 MED FILL — NOVOLOG MIX 70-30 FLEXPEN U-100 INSULIN 100 UNIT/ML SUBCUTANEOUS PEN: SUBCUTANEOUS | 100 days supply | Qty: 15 | Fill #1

## 2020-01-24 MED FILL — CALCIUM ACETATE(PHOSPHATE BINDERS) 667 MG CAPSULE: 30 days supply | Qty: 270 | Fill #0 | Status: AC

## 2020-01-24 MED FILL — GABAPENTIN 300 MG CAPSULE: ORAL | 30 days supply | Qty: 30 | Fill #1

## 2020-01-27 ENCOUNTER — Ambulatory Visit: Admit: 2020-01-27 | Discharge: 2020-01-28

## 2020-02-15 ENCOUNTER — Other Ambulatory Visit: Payer: Self-pay

## 2020-02-15 ENCOUNTER — Emergency Department
Admission: EM | Admit: 2020-02-15 | Discharge: 2020-02-15 | Disposition: A | Discharge status: Home / Self Care | Payer: Self-pay | Attending: Emergency Medicine | Admitting: Emergency Medicine

## 2020-02-15 ENCOUNTER — Encounter: Payer: Self-pay | Admitting: Emergency Medicine

## 2020-02-15 DIAGNOSIS — Z794 Long term (current) use of insulin: Secondary | ICD-10-CM | POA: Insufficient documentation

## 2020-02-15 DIAGNOSIS — S61451A Open bite of right hand, initial encounter: Secondary | ICD-10-CM | POA: Insufficient documentation

## 2020-02-15 DIAGNOSIS — S51851A Open bite of right forearm, initial encounter: Secondary | ICD-10-CM | POA: Insufficient documentation

## 2020-02-15 DIAGNOSIS — N186 End stage renal disease: Secondary | ICD-10-CM | POA: Insufficient documentation

## 2020-02-15 DIAGNOSIS — S80871A Other superficial bite, right lower leg, initial encounter: Secondary | ICD-10-CM | POA: Insufficient documentation

## 2020-02-15 DIAGNOSIS — S41151A Open bite of right upper arm, initial encounter: Secondary | ICD-10-CM | POA: Insufficient documentation

## 2020-02-15 DIAGNOSIS — Y9389 Activity, other specified: Secondary | ICD-10-CM | POA: Insufficient documentation

## 2020-02-15 DIAGNOSIS — Z2914 Encounter for prophylactic rabies immune globin: Secondary | ICD-10-CM | POA: Insufficient documentation

## 2020-02-15 DIAGNOSIS — Y929 Unspecified place or not applicable: Secondary | ICD-10-CM | POA: Insufficient documentation

## 2020-02-15 DIAGNOSIS — E1122 Type 2 diabetes mellitus with diabetic chronic kidney disease: Secondary | ICD-10-CM | POA: Insufficient documentation

## 2020-02-15 DIAGNOSIS — Y999 Unspecified external cause status: Secondary | ICD-10-CM | POA: Insufficient documentation

## 2020-02-15 DIAGNOSIS — W540XXA Bitten by dog, initial encounter: Secondary | ICD-10-CM | POA: Insufficient documentation

## 2020-02-15 DIAGNOSIS — Z23 Encounter for immunization: Secondary | ICD-10-CM | POA: Insufficient documentation

## 2020-02-15 DIAGNOSIS — S80872A Other superficial bite, left lower leg, initial encounter: Secondary | ICD-10-CM | POA: Insufficient documentation

## 2020-02-15 DIAGNOSIS — Z203 Contact with and (suspected) exposure to rabies: Secondary | ICD-10-CM | POA: Insufficient documentation

## 2020-02-15 DIAGNOSIS — I5042 Chronic combined systolic (congestive) and diastolic (congestive) heart failure: Secondary | ICD-10-CM | POA: Insufficient documentation

## 2020-02-15 DIAGNOSIS — T07XXXA Unspecified multiple injuries, initial encounter: Secondary | ICD-10-CM

## 2020-02-15 DIAGNOSIS — Z992 Dependence on renal dialysis: Secondary | ICD-10-CM | POA: Insufficient documentation

## 2020-02-15 DIAGNOSIS — I132 Hypertensive heart and chronic kidney disease with heart failure and with stage 5 chronic kidney disease, or end stage renal disease: Secondary | ICD-10-CM | POA: Insufficient documentation

## 2020-02-15 DIAGNOSIS — T148XXA Other injury of unspecified body region, initial encounter: Secondary | ICD-10-CM

## 2020-02-15 MED ORDER — AMOXICILLIN-POT CLAVULANATE 875-125 MG PO TABS: 1.0000 | ORAL_TABLET | Freq: Two times a day (BID) | ORAL | 0 refills | Status: DC

## 2020-02-15 MED ORDER — RABIES IMMUNE GLOBULIN 150 UNIT/ML IM INJ: 20.0000 [IU]/kg | INJECTION | Freq: Once | INTRAMUSCULAR | Status: DC

## 2020-02-15 MED ORDER — BACITRACIN-NEOMYCIN-POLYMYXIN 400-5-5000 EX OINT
TOPICAL_OINTMENT | Freq: Once | CUTANEOUS | Status: AC
  Administered 2020-02-15: 1 via TOPICAL
  Filled 2020-02-15: qty 1

## 2020-02-15 MED ORDER — RABIES IMMUNE GLOBULIN 150 UNIT/ML IM INJ
1500.0000 [IU] | INJECTION | Freq: Once | INTRAMUSCULAR | Status: AC
  Administered 2020-02-15: 1500 [IU] via INTRAMUSCULAR
  Filled 2020-02-15: qty 10

## 2020-02-15 MED ORDER — RABIES IMMUNE GLOBULIN 150 UNIT/ML IM INJ
250.0000 [IU] | INJECTION | Freq: Once | INTRAMUSCULAR | Status: AC
  Administered 2020-02-15: 225 [IU] via INTRAMUSCULAR
  Filled 2020-02-15: qty 2

## 2020-02-15 MED ORDER — RABIES VACCINE, PCEC IM SUSR
1.0000 mL | Freq: Once | INTRAMUSCULAR | Status: AC
  Administered 2020-02-15: 1 mL via INTRAMUSCULAR
  Filled 2020-02-15: qty 1

## 2020-02-15 NOTE — ED Provider Notes (Signed)
[facility] Emergency Department Provider Note  ____________________________________________  Time seen: Approximately 2:10 PM  I have reviewed the triage vital signs and the nursing notes.   HISTORY  Chief Complaint Animal Bite   HPI Danny [name] is a 64 y.o. male presents to the emergency department for treatment and evaluation after being bitten multiple times by 2 dogs.  He states that while he was leaving dialysis he was attacked by 2 "pit bulls."  He states that he is not sure what provoked the dogs.  Police department on scene have notified animal control.   Past Medical History:  Diagnosis Date  . Diabetes mellitus without complication ([place])   . Dialysis patient [facility])    tuesday, thursday and saturday  . Gout   . Hypertension   . Kidney disease     Patient Active Problem List   Diagnosis Date Noted  . Diabetes mellitus with ESRD (end-stage renal disease) ([place]) 08/01/2019  . Diarrhea 08/01/2019  . Hypertension   . Chronic combined systolic and diastolic CHF (congestive heart failure) ([place])   . Volume overload   . Acute diarrhea 07/27/2018  . Viral gastroenteritis 10/04/2016  . Anemia 10/04/2016  . Thrombocytopenia ([place]) 10/04/2016  . Rectal bleeding 10/04/2016  . Hyperkalemia 10/02/2016  . Pulmonary edema 05/13/2016    Past Surgical History:  Procedure Laterality Date  . AV FISTULA PLACEMENT    . miscellaneous     peritoneal dialysis catheter placement and removal    Prior to Admission medications   Medication Sig Start Date End Date Taking? Authorizing Provider  albuterol (VENTOLIN HFA) 108 (90 Base) MCG/ACT inhaler Inhale 2 puffs into the lungs every 6 (six) hours as needed for wheezing. 07/19/18   [provider]  amLODipine (NORVASC) 5 MG tablet Take 5 mg by mouth at bedtime. 11/05/18   [provider]  amoxicillin-clavulanate (AUGMENTIN) 875-125 MG tablet Take 1 tablet by mouth 2 (two) times daily. 02/15/20    Saivion Goettel, Johnette Abraham B, FNP  calcium acetate (PHOSLO) 667 MG capsule Take 2,001 mg by mouth 3 (three) times daily with meals.     [provider]  carvedilol (COREG) 6.25 MG tablet Take 6.25 mg by mouth 2 (two) times daily with a meal.    [provider]  citalopram (CELEXA) 20 MG tablet Take 20 mg by mouth daily.     [provider]  gabapentin (NEURONTIN) 300 MG capsule Take 300 mg by mouth daily. 04/08/19   [provider]  insulin aspart protamine- aspart (NOVOLOG MIX 70/30) (70-30) 100 UNIT/ML injection Inject 5-10 Units into the skin See admin instructions. Inject 10u under the skin every morning and 5u under the skin at night    [provider]  loperamide (IMODIUM) 2 MG capsule Take 1 capsule (2 mg total) by mouth every 6 (six) hours as needed for diarrhea or loose stools. 07/29/18   Henreitta Leber, MD  multivitamin (RENA-VIT) TABS tablet Take 1 tablet by mouth at bedtime. 08/04/19   Swayze, Ava, DO  Nutritional Supplements (FEEDING SUPPLEMENT, NEPRO CARB STEADY,) LIQD Take 237 mLs by mouth 2 (two) times daily between meals. 08/05/19   Swayze, Ava, DO  omeprazole (PRILOSEC OTC) 20 MG tablet Take 20 mg by mouth daily.    [provider]  sacubitril-valsartan (ENTRESTO) 24-26 MG Take 1 tablet by mouth 2 (two) times daily. Patient not taking: Reported on 11/23/2019 05/29/19   Bettey Costa, MD  sevelamer carbonate (RENVELA) 800 MG tablet Take 884-1,660  mg by mouth See admin instructions. Take 4 tablets (3200mg ) by mouth three times daily with meals and take 1 tablet (800mg ) by mouth twice daily with snacks    [provider]    Allergies Patient has no known allergies.  Family History  Problem Relation Age of Onset  . Diabetes Mother   . Prostate cancer Father   . Kidney disease Sister     Social History Social History   Tobacco Use  . Smoking status: Never Smoker  . Smokeless tobacco: Never Used  Substance Use Topics  . Alcohol  use: No  . Drug use: No    Review of Systems  Constitutional: Negative for fever. Respiratory: Negative for cough or shortness of breath.  Musculoskeletal: Negative for myalgias Skin: Positive for abrasions and puncture wounds. Neurological: Negative for numbness or paresthesias. ____________________________________________   PHYSICAL EXAM:  VITAL SIGNS: ED Triage Vitals  Enc Vitals Group     BP 02/15/20 0914 (!) 168/70     Pulse Rate 02/15/20 0914 77     Resp 02/15/20 0914 16     Temp 02/15/20 0914 98.3 F (36.8 C)     Temp Source 02/15/20 0914 Oral     SpO2 02/15/20 0914 95 %     Weight 02/15/20 0916 190 lb (86.2 kg)     Height 02/15/20 0916 5\' 8"  (1.727 m)     Head Circumference --      Peak Flow --      Pain Score 02/15/20 0916 5     Pain Loc --      Pain Edu? --      Excl. in [place]? --      Constitutional: Well appearing. Eyes: Conjunctivae are clear without discharge or drainage. Nose: No rhinorrhea noted. Mouth/Throat: Airway is patent.  Neck: No stridor. Unrestricted range of motion observed. Cardiovascular: Capillary refill is <3 seconds.  Respiratory: Respirations are even and unlabored.. Musculoskeletal: Unrestricted range of motion observed. Neurologic: Awake, alert, and oriented x 4.  Skin:  Multiple abrasions over bilateral lower extremities, right palm, puncture wounds to right forearm, and right upper arm.  ____________________________________________   LABS (all labs ordered are listed, but only abnormal results are displayed)  Labs Reviewed - No data to display ____________________________________________  EKG  Not indicated. ____________________________________________  RADIOLOGY  Not indicated ____________________________________________   PROCEDURES  Procedures ____________________________________________   INITIAL IMPRESSION / ASSESSMENT AND PLAN / ED COURSE  Danny [name] is a 64 y.o. male presents to the emergency  department for treatment and evaluation after being attacked by 2 pit bulls.  He reports that the incident was not provoked.  He is unsure if animal control has been able to capture the dogs.  Rabies series recommended and patient agreed.  Wound cleaned with Hibiclens and saline then covered with bacitracin and Band-Aids.  Patient will be given prescription for Augmentin and advised to come to the emergency department or see his primary care provider immediately for any concerns of infection.  He is aware to return in 3 days for his second rabies vaccination.   Medications  neomycin-bacitracin-polymyxin (NEOSPORIN) ointment packet (1 application Topical Given 02/15/20 1114)  rabies vaccine (RABAVERT) injection 1 mL (1 mL Intramuscular Given 02/15/20 1115)  rabies immune globulin (HYPERAB/KEDRAB) injection 1,500 Units (1,500 Units Intramuscular Given 02/15/20 1158)    And  rabies immune globulin (HYPERAB/KEDRAB) injection 225 Units (225 Units Intramuscular Given 02/15/20 1159)     Pertinent labs & imaging results that were available  during my care of the patient were reviewed by me and considered in my medical decision making (see chart for details).  ____________________________________________   FINAL CLINICAL IMPRESSION(S) / ED DIAGNOSES  Final diagnoses:  Need for post exposure prophylaxis for rabies  Puncture wound  Multiple abrasions  Dog bite, initial encounter    ED Discharge Orders         Ordered    amoxicillin-clavulanate (AUGMENTIN) 875-125 MG tablet  2 times daily     02/15/20 1207           Note:  This document was prepared using Dragon voice recognition software and may include unintentional dictation errors.   Victorino Dike, FNP 02/15/20 1419    Duffy Bruce, MD 02/16/20 2031

## 2020-02-15 NOTE — Progress Notes (Signed)
Hemodialysis patient known at [facility], also known as [place] MWF 5:40am. Per clinic, they have spoken with patient's son and scheduled a make up treatment for tomorrow 5/20 at 10:40am. Please contact me with any dialysis placement concerns.  Elvera Bicker Dialysis Coordinator [phone]

## 2020-02-15 NOTE — ED Notes (Signed)
Pt states he was leaving dialysis and 2 dogs attacked him, pt has abrasions, skin tears and puncture wounds to the right arm, BL LE from the knee down in multiple sites. No wounds to the torso, neck, head or left arm which is were the pt dialysis site is located. Pt is a/ox4.

## 2020-02-15 NOTE — ED Triage Notes (Signed)
Pt was leaving dialysis and was attacked by stray dog.  Bites to right forearm, left shin, right knee.  Per first nurse police were on scene. No active bleeding.  Appears more as abrasions than lacerations.  Ambulatory. NAD.

## 2020-02-19 ENCOUNTER — Emergency Department
Admission: EM | Admit: 2020-02-19 | Discharge: 2020-02-19 | Disposition: A | Discharge status: Home / Self Care | Payer: Self-pay | Attending: Student | Admitting: Student

## 2020-02-19 ENCOUNTER — Other Ambulatory Visit: Payer: Self-pay

## 2020-02-19 ENCOUNTER — Encounter: Payer: Self-pay | Admitting: Emergency Medicine

## 2020-02-19 DIAGNOSIS — I5042 Chronic combined systolic (congestive) and diastolic (congestive) heart failure: Secondary | ICD-10-CM | POA: Insufficient documentation

## 2020-02-19 DIAGNOSIS — Z79899 Other long term (current) drug therapy: Secondary | ICD-10-CM | POA: Insufficient documentation

## 2020-02-19 DIAGNOSIS — Z4800 Encounter for change or removal of nonsurgical wound dressing: Secondary | ICD-10-CM | POA: Insufficient documentation

## 2020-02-19 DIAGNOSIS — I132 Hypertensive heart and chronic kidney disease with heart failure and with stage 5 chronic kidney disease, or end stage renal disease: Secondary | ICD-10-CM | POA: Insufficient documentation

## 2020-02-19 DIAGNOSIS — Z5189 Encounter for other specified aftercare: Secondary | ICD-10-CM

## 2020-02-19 DIAGNOSIS — Z794 Long term (current) use of insulin: Secondary | ICD-10-CM

## 2020-02-19 DIAGNOSIS — E1122 Type 2 diabetes mellitus with diabetic chronic kidney disease: Secondary | ICD-10-CM | POA: Insufficient documentation

## 2020-02-19 DIAGNOSIS — N186 End stage renal disease: Secondary | ICD-10-CM | POA: Insufficient documentation

## 2020-02-19 DIAGNOSIS — Z992 Dependence on renal dialysis: Secondary | ICD-10-CM | POA: Insufficient documentation

## 2020-02-19 DIAGNOSIS — Z7984 Long term (current) use of oral hypoglycemic drugs: Secondary | ICD-10-CM | POA: Insufficient documentation

## 2020-02-19 DIAGNOSIS — E1165 Type 2 diabetes mellitus with hyperglycemia: Secondary | ICD-10-CM | POA: Insufficient documentation

## 2020-02-19 LAB — CBC WITH DIFFERENTIAL/PLATELET
Abs Immature Granulocytes: 0.02 10*3/uL (ref 0.00–0.07)
Basophils Absolute: 0 10*3/uL (ref 0.0–0.1)
Basophils Relative: 1 %
Eosinophils Absolute: 0.2 10*3/uL (ref 0.0–0.5)
Eosinophils Relative: 3 %
HCT: 33.9 % — ABNORMAL LOW (ref 39.0–52.0)
Hemoglobin: 11.2 g/dL — ABNORMAL LOW (ref 13.0–17.0)
Immature Granulocytes: 0 %
Lymphocytes Relative: 10 %
Lymphs Abs: 0.6 10*3/uL — ABNORMAL LOW (ref 0.7–4.0)
MCH: 32.3 pg (ref 26.0–34.0)
MCHC: 33 g/dL (ref 30.0–36.0)
MCV: 97.7 fL (ref 80.0–100.0)
Monocytes Absolute: 0.8 10*3/uL (ref 0.1–1.0)
Monocytes Relative: 15 %
Neutro Abs: 3.7 10*3/uL (ref 1.7–7.7)
Neutrophils Relative %: 71 %
Platelets: 138 10*3/uL — ABNORMAL LOW (ref 150–400)
RBC: 3.47 MIL/uL — ABNORMAL LOW (ref 4.22–5.81)
RDW: 13.2 % (ref 11.5–15.5)
WBC: 5.3 10*3/uL (ref 4.0–10.5)
nRBC: 0 % (ref 0.0–0.2)

## 2020-02-19 LAB — BASIC METABOLIC PANEL
Anion gap: 13 (ref 5–15)
BUN: 62 mg/dL — ABNORMAL HIGH (ref 8–23)
CO2: 27 mmol/L (ref 22–32)
Calcium: 9.1 mg/dL (ref 8.9–10.3)
Chloride: 93 mmol/L — ABNORMAL LOW (ref 98–111)
Creatinine, Ser: 6.66 mg/dL — ABNORMAL HIGH (ref 0.61–1.24)
GFR calc Af Amer: 9 mL/min — ABNORMAL LOW (ref >60)
GFR calc non Af Amer: 8 mL/min — ABNORMAL LOW (ref >60)
Glucose, Bld: 271 mg/dL — ABNORMAL HIGH (ref 70–99)
Potassium: 5.3 mmol/L — ABNORMAL HIGH (ref 3.5–5.1)
Sodium: 133 mmol/L — ABNORMAL LOW (ref 135–145)

## 2020-02-19 NOTE — Discharge Instructions (Signed)
Keep your appointment with the dialysis center tomorrow.  Continue taking your regular medication and finish the antibiotic that you are currently taking.  Clean the dog bite areas daily with mild soap and water and continue to watch for signs of infection however today they appear to be healing well.  You also need to watch your intake of food as you are blood sugar was much higher than normal most likely from the [place] bread that you ate.  Return to the emergency department or see your primary care provider if any sudden changes in your wound or concerns for infection.

## 2020-02-19 NOTE — ED Notes (Signed)
Pt discharged by [place], [place].  IV removed and instructions reviewed.

## 2020-02-19 NOTE — ED Triage Notes (Signed)
Pt to ED via POV. Pt states that he was here last week for dog bites. Pt is where to have them rechecked. Pt is in NAD.

## 2020-02-19 NOTE — ED Provider Notes (Signed)
[facility] Emergency Department Provider Note  ____________________________________________   First MD Initiated Contact with Patient 02/19/20 1223     (approximate)  I have reviewed the triage vital signs and the nursing notes.   HISTORY  Chief Complaint Wound Check   HPI Danny [name] is a 64 y.o. male presents to the ED for wound recheck of his dog bites that occurred last week.  Patient's daughter is with him and states there has been no drainage.  She denies any fever or chills.  Continues to take the Augmentin that was prescribed for him on that day.  Patient reports that his blood sugars  usually run between 120 and 140's and he continues to take insulin.  Patient reports that he feels fine.  Daughter is concerned of an infection due to his diabetes.  He rates his discomfort as 7 out of 10.      Past Medical History:  Diagnosis Date  . Diabetes mellitus without complication ([place])   . Dialysis patient [facility])    tuesday, thursday and saturday  . Gout   . Hypertension   . Kidney disease     Patient Active Problem List   Diagnosis Date Noted  . Diabetes mellitus with ESRD (end-stage renal disease) ([place]) 08/01/2019  . Diarrhea 08/01/2019  . Hypertension   . Chronic combined systolic and diastolic CHF (congestive heart failure) ([place])   . Volume overload   . Acute diarrhea 07/27/2018  . Viral gastroenteritis 10/04/2016  . Anemia 10/04/2016  . Thrombocytopenia ([place]) 10/04/2016  . Rectal bleeding 10/04/2016  . Hyperkalemia 10/02/2016  . Pulmonary edema 05/13/2016    Past Surgical History:  Procedure Laterality Date  . AV FISTULA PLACEMENT    . miscellaneous     peritoneal dialysis catheter placement and removal    Prior to Admission medications   Medication Sig Start Date End Date Taking? Authorizing Provider  albuterol (VENTOLIN HFA) 108 (90 Base) MCG/ACT inhaler Inhale 2 puffs into the lungs every 6 (six) hours as needed for wheezing.  07/19/18   [provider]  amLODipine (NORVASC) 5 MG tablet Take 5 mg by mouth at bedtime. 11/05/18   [provider]  amoxicillin-clavulanate (AUGMENTIN) 875-125 MG tablet Take 1 tablet by mouth 2 (two) times daily. 02/15/20   Triplett, Johnette Abraham B, FNP  calcium acetate (PHOSLO) 667 MG capsule Take 2,001 mg by mouth 3 (three) times daily with meals.     [provider]  carvedilol (COREG) 6.25 MG tablet Take 6.25 mg by mouth 2 (two) times daily with a meal.    [provider]  citalopram (CELEXA) 20 MG tablet Take 20 mg by mouth daily.     [provider]  gabapentin (NEURONTIN) 300 MG capsule Take 300 mg by mouth daily. 04/08/19   [provider]  insulin aspart protamine- aspart (NOVOLOG MIX 70/30) (70-30) 100 UNIT/ML injection Inject 5-10 Units into the skin See admin instructions. Inject 10u under the skin every morning and 5u under the skin at night    [provider]  loperamide (IMODIUM) 2 MG capsule Take 1 capsule (2 mg total) by mouth every 6 (six) hours as needed for diarrhea or loose stools. 07/29/18   Henreitta Leber, MD  multivitamin (RENA-VIT) TABS tablet Take 1 tablet by mouth at bedtime. 08/04/19   Swayze, Ava, DO  Nutritional Supplements (FEEDING SUPPLEMENT, NEPRO CARB STEADY,) LIQD Take 237 mLs by mouth 2 (two) times daily between meals. 08/05/19   Swayze,  Ava, DO  omeprazole (PRILOSEC OTC) 20 MG tablet Take 20 mg by mouth daily.    [provider]  sacubitril-valsartan (ENTRESTO) 24-26 MG Take 1 tablet by mouth 2 (two) times daily. Patient not taking: Reported on 11/23/2019 05/29/19   Bettey Costa, MD  sevelamer carbonate (RENVELA) 800 MG tablet Take 800-3,200 mg by mouth See admin instructions. Take 4 tablets (3200mg ) by mouth three times daily with meals and take 1 tablet (800mg ) by mouth twice daily with snacks    [provider]    Allergies Patient has no known allergies.  Family History  Problem  Relation Age of Onset  . Diabetes Mother   . Prostate cancer Father   . Kidney disease Sister     Social History Social History   Tobacco Use  . Smoking status: Never Smoker  . Smokeless tobacco: Never Used  Substance Use Topics  . Alcohol use: No  . Drug use: No    Review of Systems Constitutional: No fever/chills Cardiovascular: Denies chest pain. Respiratory: Denies shortness of breath. Gastrointestinal: No abdominal pain.  No nausea, no vomiting.  Musculoskeletal: Negative for muscle skeletal pain. Skin: Positive for dog bites. Neurological: Negative for  focal weakness or numbness.  ____________________________________________   PHYSICAL EXAM:  VITAL SIGNS: ED Triage Vitals  Enc Vitals Group     BP 02/19/20 1028 (!) 165/84     Pulse Rate 02/19/20 1028 70     Resp 02/19/20 1028 16     Temp 02/19/20 1028 98.4 F (36.9 C)     Temp Source 02/19/20 1028 Oral     SpO2 02/19/20 1028 97 %     Weight --      Height --      Head Circumference --      Peak Flow --      Pain Score 02/19/20 1035 7     Pain Loc --      Pain Edu? --      Excl. in [place]? --     Constitutional: Alert and oriented. Well appearing and in no acute distress. Eyes: Conjunctivae are normal.  Head: Atraumatic. Neck: No stridor.   Cardiovascular: Normal rate, regular rhythm. Grossly normal heart sounds.  Good peripheral circulation. Respiratory: Normal respiratory effort.  No retractions. Lungs CTAB. Gastrointestinal: Soft and nontender. No distention. Musculoskeletal: Moves upper and lower extremities without any difficulty normal gait was noted. Neurologic:  Normal speech and language. No gross focal neurologic deficits are appreciated. No gait instability. Skin:  Skin is warm, dry.  Wounds from last week's dog bites appear to be healing without any active drainage or signs of infection.  Minimal tenderness on palpation. Psychiatric: Mood and affect are normal. Speech and behavior are  normal.  ____________________________________________   LABS (all labs ordered are listed, but only abnormal results are displayed)  Labs Reviewed  CBC WITH DIFFERENTIAL/PLATELET - Abnormal; Notable for the following components:      Result Value   RBC 3.47 (*)    Hemoglobin 11.2 (*)    HCT 33.9 (*)    Platelets 138 (*)    Lymphs Abs 0.6 (*)    All other components within normal limits  BASIC METABOLIC PANEL - Abnormal; Notable for the following components:   Sodium 133 (*)    Potassium 5.3 (*)    Chloride 93 (*)    Glucose, Bld 271 (*)    BUN 62 (*)    Creatinine, Ser 6.66 (*)    GFR calc non  Af Amer 8 (*)    GFR calc Af Amer 9 (*)    All other components within normal limits    PROCEDURES  Procedure(s) performed (including Critical Care):  Procedures   ____________________________________________   INITIAL IMPRESSION / ASSESSMENT AND PLAN / ED COURSE  As part of my medical decision making, I reviewed the following data within the electronic MEDICAL RECORD NUMBER Notes from prior ED visits and [place] Controlled Substance Database  64 year old male presents to the ED with daughter for recheck of his dog bites that he was seen previously for.  Patient continues to take Augmentin to prevent infection.  He denies any difficulty however daughter is concerned that they may be getting infected due to his diabetes.  Patient states that his blood sugar usually runs between 120-140 however today his blood sugar was 271.  Patient admits that he did take his insulin as prescribed but he admits to eating "Mexican bread" which the daughter explains is caramelized sugar on bread.  This was eaten this morning prior to coming to the ED.  Patient has a history of end-stage renal disease and his BUN and creatinine are somewhat improved from the last time he had labs done at [facility].  Patient is encouraged to drink lots of fluids to stay hydrated.  He is scheduled for dialysis tomorrow with his last  dialysis being on Saturday.  Patient was discharged and was ambulatory.  ____________________________________________   FINAL CLINICAL IMPRESSION(S) / ED DIAGNOSES  Final diagnoses:  Encounter for wound re-check  Uncontrolled diabetes mellitus with hyperglycemia, with long-term current use of insulin ([place])  End stage chronic kidney disease [facility])     ED Discharge Orders    None       Note:  This document was prepared using Dragon voice recognition software and may include unintentional dictation errors.    Johnn Hai, PA-C 02/19/20 1516    Lilia Pro., MD 02/19/20 [phone]

## 2020-02-20 ENCOUNTER — Other Ambulatory Visit: Payer: Self-pay

## 2020-02-20 ENCOUNTER — Emergency Department: Payer: Self-pay

## 2020-02-20 DIAGNOSIS — M25512 Pain in left shoulder: Secondary | ICD-10-CM | POA: Insufficient documentation

## 2020-02-20 DIAGNOSIS — Z5321 Procedure and treatment not carried out due to patient leaving prior to being seen by health care provider: Secondary | ICD-10-CM | POA: Insufficient documentation

## 2020-02-20 NOTE — ED Triage Notes (Signed)
Pt arrives to ED via POV from home with c/o shoulder pain x2-3 hrs. Pt denies CP or SHOB. Pt denies any injury or trauma to shoulder. Pt is A&O, in NAD; RR even, regular, and unlabored.

## 2020-02-21 ENCOUNTER — Emergency Department
Admission: EM | Admit: 2020-02-21 | Discharge: 2020-02-21 | Disposition: A | Discharge status: Home / Self Care | Payer: Self-pay | Attending: Emergency Medicine | Admitting: Emergency Medicine

## 2020-02-21 NOTE — ED Notes (Signed)
Pt has not returned

## 2020-02-21 NOTE — ED Notes (Signed)
Pt noted leaving triage then exiting ED lobby

## 2020-02-21 NOTE — ED Notes (Addendum)
Pt to triage for reassessment; st onset left shoulder pain while showering tonight; denies any known injury; st pain with any ROM of left arm; tenderness with palpation to anterior & lateral shoulder; st pain is nonradiating and denies any accomp symptoms; pt currently has dressing in place to site; st his wife applied a pain cream

## 2020-02-27 ENCOUNTER — Ambulatory Visit: Admit: 2020-02-27 | Discharge: 2020-02-28

## 2020-03-28 ENCOUNTER — Ambulatory Visit: Admit: 2020-03-28 | Discharge: 2020-03-29

## 2020-04-10 MED ORDER — CINACALCET 30 MG TABLET
ORAL_TABLET | Freq: Every day | ORAL | 3 refills | 90.00000 days
Start: 2020-04-10 — End: ?

## 2020-04-11 NOTE — Unmapped (Signed)
[facility] Specialty Medication Onboarding    Specialty Medication: Cinacalcet  Prior Authorization: Not Required   Financial Assistance: No - copay  <$25  Final Copay/Day Supply: $0.00 / 30    Insurance Restrictions: Yes - max 1 month supply     Notes to Pharmacist: N/A    The triage team has completed the benefits investigation and has determined that the patient is able to fill this medication at [facility]. Please contact the patient to complete the onboarding or follow up with the prescribing physician as needed.

## 2020-04-12 MED FILL — CINACALCET 30 MG TABLET: ORAL | 30 days supply | Qty: 30 | Fill #0

## 2020-04-12 MED FILL — CINACALCET 30 MG TABLET: 30 days supply | Qty: 30 | Fill #0 | Status: AC

## 2020-04-12 NOTE — Unmapped (Signed)
[facility]   Patient Onboarding/Medication Counseling    Mr.Mitchell Gray is a 64 y.o. male with ESRD who I am counseling today on initiation of therapy.  I am speaking to the patient's family member, daughter, Mitchell Gray.    Was a Nurse, learning disability used for this call? No    Verified patient's date of birth / HIPAA.    Specialty medication(s) to be sent: Transplant: cinacalcet      Non-specialty medications/supplies to be sent: none      Medications not needed at this time: none         Sensipar (cinacalcet)    Medication & Administration     Dosage: Take 1 tablet (30mg ) by mouth once daily    Administration:   ??? Take with food or shortly after a meal.  ??? Take whole, do not chew, crush, or divide tablets    Adherence/Missed dose instructions:  ??? Take a missed dose as soon as you think about it, with food.  ??? If it is close to the time for your next dose, skip the missed dose and go back to your normal time.  ??? Do not take 2 doses at the same time or extra doses.    Goals of Therapy     ??? Normalize serum calcium levels    Side Effects & Monitoring Parameters     ??? Common side effects  ??? Feeling dizzy, tired, or weak  ??? Nausea, vomiting, diarrhea, and stomach pain  ??? Headache  ??? Back pain, muscle spasm/pain  ??? Cough  ??? Signs of a common cold    ??? The following side effects should be reported to the provider:  ??? Allergic reaction  ??? Signs of high blood pressure (very bad headaches or dizziness, passing out, or change in eyesight)  ??? Low calcium levels (muscle cramps or spasms, numbness/tingling, or seizures)  ??? Electrolyte issues (mood changes, confusion, muscle pain or weakness, abnormal heartbeat, lack of appetite, severe nausea/vomiting)  ??? Dehydration (dry skin, dry mouth, dry eyes, increased thirst, fast heartbeat, dizziness, fast breathing, or confusion)  ??? Heart issues (cough or shortness of breath, swelling in ankles or legs, abnormal heartbeat, weight gain > 5lbs in 24 hours, dizziness/passing out  ??? Chest pain  ??? Depression  ??? Joint pain  ??? Bone pain  ??? Severe loss of strength and energy  ??? Signs of stomach or bowel bleeding (black, tarry or bloddy stools, throwing up blood, throw up that looks like coffee grounds, very bad stomach pain, very upset stomach or throwing up)    ??? Monitoring Parameters  ??? Monitor serum calcium and phosphorus levels prior to starting and monthly   ??? Monitor intact parathyroid hormone levels prior to starting and throughout therapy    Contraindications, Warnings, & Precautions     ??? Adynamic bone disease - may develop if intact parathyroid hormone levels are suppressed <100pg/ml  ??? Cardiovascular effects - QT prolongation and ventricular arrhythmia secondary to hypocalcemia may occur  ??? GI effects - GI bleeding  ??? Hypocalcemia    Drug/Food Interactions     ??? Medication list reviewed in Epic. The patient was instructed to inform the care team before taking any new medications or supplements. There is a category C interaction between cinacalcet and carvedilol. This interaction may increase the serum concentrations of carvedilol. Monitor for signs/symptoms of excessive response to carvedilol (hypotension, bradycardia, orthostasis).     Storage, Handling Precautions, & Disposal   ??? Store at  room temperature  ??? Keep away from children and pets        Current Medications (including OTC/herbals), Comorbidities and Allergies     Current Outpatient Medications   Medication Sig Dispense Refill   ??? albuterol HFA 90 mcg/actuation inhaler Inhale 2 puffs every six (6) hours as needed for wheezing. 18 g 1   ??? amLODIPine (NORVASC) 5 MG tablet Take 1 tablet by mouth at bedtime 90 tablet 3   ??? aspirin (ECOTRIN) 81 MG tablet Take 1 tablet (81 mg total) by mouth daily. 30 tablet 11   ??? blood sugar diagnostic (GLUCOSE BLOOD) Strp Test blood sugar 3 times daily - before breakfast, lunch and dinner and with signs/symptoms of hypoglycemia. 240 strip 3   ??? calcium acetate,phosphat bind, (PHOSLO) 667 mg capsule Take 3 capsules (2,001 mg total) by mouth Three (3) times a day. 270 capsule 11   ??? carvediloL (COREG) 3.125 MG tablet Take 6.25 mg by mouth.     ??? carvediloL (COREG) 6.25 MG tablet Take 1 tablet by mouth twice a day 180 tablet 3   ??? cinacalcet (SENSIPAR) 30 MG tablet Take 1 tablet by mouth once a day 90 tablet 3   ??? citalopram (CELEXA) 20 MG tablet TAKE 1 TABLET (20 MG TOTAL) BY MOUTH DAILY. 90 tablet 3   ??? codeine-guaifenesin (GUAIFENESIN AC) 10-100 mg/5 mL liquid Take 5 mL by mouth every twelve (12) hours as needed for cough. 70 mL 0   ??? ethyl chloride 100 % spray Apply over AV fistula immediately prior to cannulation TIW. 103.5 mL 11   ??? fluticasone (FLONASE) 50 mcg/actuation nasal spray 1 spray by Each Nare route daily. 16 g 3   ??? furosemide (LASIX) 80 MG tablet Take 1 tablet (80 mg total) by mouth daily. 30 tablet 5   ??? gabapentin (NEURONTIN) 300 MG capsule Take 1 capsule (300 mg total) by mouth daily. 30 capsule 11   ??? insulin aspart protamine-insulin aspart (NOVOLOG 70/30) 100 unit/mL (70-30) injection pen Inject 10 units under the skin once daily in the morning before meal and inject 5 units every night before meal. 15 mL 11   ??? insulin syringe-needle U-100 Syrg Inject insulin BID 200 each 3   ??? liraglutide (VICTOZA) injection pen Inject 0.6 mg under the skin daily. 36 mL 0   ??? loperamide (IMODIUM) 2 mg capsule Take 2 mg by mouth.     ??? nut.tx.imp.renal fxn,lac-reduc 0.08 gram-1.8 kcal/mL Liqd Take 237 mLs by mouth 2 (two) times daily between meals. 237 mL 0   ??? omeprazole (PRILOSEC OTC) 20 MG tablet Take 20 mg by mouth.     ??? pen needle, diabetic 31 gauge x 5/16 (8 mm) Ndle Use as directed for insulin administration twice daily 400 each 4   ??? sevelamer (RENVELA) 800 mg tablet Take 4 tablets (3,200 mg total) by mouth Three (3) times a day with a meal. 540 tablet 5   ??? sevelamer (RENVELA) 800 mg tablet Take 4 tablets (3200mg ) by mouth three times a day with meals, and take 1 tablet (800mg ) with snacks 1080 tablet 11   ??? traMADol (ULTRAM) 50 mg tablet Take 1 tablet (50 mg total) by mouth every eight (8) hours as needed for pain. 60 tablet 1   ??? vancomycin (VANCOCIN) 125 MG capsule Take 1 capsule (125 mg total) by mouth 4 (four) times daily. 40 capsule 0   ??? vit B cmplx-FA-vit C-biotin, renal, (RENA-VITE RX) 1-60-300 mg-mg-mcg Tab Take 1 tablet by  mouth nightly at bedtime. 30 tablet 0     No current facility-administered medications for this visit.       No Known Allergies    Patient Active Problem List   Diagnosis   ??? CKD (chronic kidney disease), stage V (CMS-HCC)   ??? Type II diabetes mellitus with renal manifestations, uncontrolled (CMS-HCC)   ??? Essential (primary) hypertension   ??? Hyperlipidemia   ??? Vitamin D deficiency   ??? Gout   ??? Left ankle pain   ??? Sensorineural hearing loss of right ear   ??? Tinnitus   ??? Hypoglycemia associated with type 2 diabetes mellitus (CMS-HCC)   ??? Uremia   ??? Episode of recurrent major depressive disorder (CMS-HCC)   ??? Leg muscle spasm   ??? MDD (major depressive disorder)   ??? Anemia   ??? CHF exacerbation (CMS-HCC)   ??? Hyperkalemia   ??? Pulmonary edema   ??? Rectal bleeding   ??? Thrombocytopenia (CMS-HCC)   ??? Viral gastroenteritis   ??? Heart failure with reduced ejection fraction (CMS-HCC)       Reviewed and up to date in Epic.    Appropriateness of Therapy     Is medication and dose appropriate based on diagnosis? Yes    Prescription has been clinically reviewed: Yes    Baseline Quality of Life Assessment      How many days over the past month did your condition  keep you from your normal activities? For example, brushing your teeth or getting up in the morning. Patient declined to answer    Financial Information     Medication Assistance provided: None Required    Anticipated copay of $0 reviewed with patient. Verified delivery address.    Delivery Information     Scheduled delivery date: 04/13/20    Expected start date: 04/13/20    Medication will be delivered via UPS to the prescription address in [facility].  This shipment will not require a signature.      Explained the services we provide at [facility] and that each month we would call to set up refills.  Stressed importance of returning phone calls so that we could ensure they receive their medications in time each month.  Informed patient that we should be setting up refills 7-10 days prior to when they will run out of medication.  A pharmacist will reach out to perform a clinical assessment periodically.  Informed patient that a welcome packet and a drug information handout will be sent.      Patient verbalized understanding of the above information as well as how to contact the pharmacy at [phone] option 4 with any questions/concerns.  The pharmacy is open Monday through Friday 8:30am-4:30pm.  A pharmacist is available 24/7 via pager to answer any clinical questions they may have.    Patient Specific Needs     - Does the patient have any physical, cognitive, or cultural barriers? No    - Patient prefers to have medications discussed with  Family Member     - Is the patient or caregiver able to read and understand education materials at a high school level or above? Yes    - Patient's primary language is  English     - Is the patient high risk? No     - Does the patient require a Care Management Plan? No     - Does the patient require physician intervention or other additional services (i.e. nutrition, smoking cessation, social  work)? No      Arnold Long  [facility] Specialty Pharmacist

## 2020-04-13 ENCOUNTER — Ambulatory Visit: Admit: 2020-04-13 | Discharge: 2020-04-14

## 2020-04-13 DIAGNOSIS — E1122 Type 2 diabetes mellitus with diabetic chronic kidney disease: Principal | ICD-10-CM

## 2020-04-13 DIAGNOSIS — E7800 Pure hypercholesterolemia, unspecified: Principal | ICD-10-CM

## 2020-04-13 DIAGNOSIS — Z992 Dependence on renal dialysis: Secondary | ICD-10-CM

## 2020-04-13 DIAGNOSIS — I5020 Unspecified systolic (congestive) heart failure: Principal | ICD-10-CM

## 2020-04-13 DIAGNOSIS — Z794 Long term (current) use of insulin: Principal | ICD-10-CM

## 2020-04-13 DIAGNOSIS — N186 End stage renal disease: Principal | ICD-10-CM

## 2020-04-13 DIAGNOSIS — Z1211 Encounter for screening for malignant neoplasm of colon: Principal | ICD-10-CM

## 2020-04-13 DIAGNOSIS — F329 Major depressive disorder, single episode, unspecified: Principal | ICD-10-CM

## 2020-04-13 DIAGNOSIS — I10 Essential (primary) hypertension: Principal | ICD-10-CM

## 2020-04-13 LAB — COMPREHENSIVE METABOLIC PANEL
ALBUMIN: 4 g/dL (ref 3.4–5.0)
ALT (SGPT): 16 U/L (ref 10–49)
ANION GAP: 8 mmol/L (ref 5–14)
AST (SGOT): 26 U/L (ref <=34)
BILIRUBIN TOTAL: 0.5 mg/dL (ref 0.3–1.2)
BLOOD UREA NITROGEN: 31 mg/dL — ABNORMAL HIGH (ref 9–23)
BUN / CREAT RATIO: 8
CALCIUM: 10 mg/dL (ref 8.7–10.4)
CHLORIDE: 96 mmol/L — ABNORMAL LOW (ref 98–107)
CO2: 34 mmol/L — ABNORMAL HIGH (ref 20.0–31.0)
CREATININE: 4.01 mg/dL — ABNORMAL HIGH
EGFR CKD-EPI AA MALE: 17 mL/min/{1.73_m2} — ABNORMAL LOW (ref >=60)
EGFR CKD-EPI NON-AA MALE: 15 mL/min/{1.73_m2} — ABNORMAL LOW (ref >=60)
GLUCOSE RANDOM: 84 mg/dL (ref 70–179)
POTASSIUM: 4.5 mmol/L (ref 3.4–4.5)
PROTEIN TOTAL: 8.1 g/dL (ref 5.7–8.2)
SODIUM: 138 mmol/L (ref 135–145)

## 2020-04-13 LAB — CBC W/ AUTO DIFF
BASOPHILS ABSOLUTE COUNT: 0 10*9/L (ref 0.0–0.1)
BASOPHILS RELATIVE PERCENT: 0.9 %
EOSINOPHILS ABSOLUTE COUNT: 0.1 10*9/L (ref 0.0–0.7)
EOSINOPHILS RELATIVE PERCENT: 2.8 %
HEMOGLOBIN: 10.6 g/dL — ABNORMAL LOW (ref 13.5–17.5)
LYMPHOCYTES ABSOLUTE COUNT: 0.7 10*9/L (ref 0.7–4.0)
LYMPHOCYTES RELATIVE PERCENT: 14.7 %
MEAN CORPUSCULAR HEMOGLOBIN CONC: 32.5 g/dL (ref 30.0–36.0)
MEAN CORPUSCULAR HEMOGLOBIN: 30.8 pg (ref 26.0–34.0)
MEAN CORPUSCULAR VOLUME: 94.6 fL (ref 81.0–95.0)
MEAN PLATELET VOLUME: 10.6 fL — ABNORMAL HIGH (ref 7.0–10.0)
MONOCYTES ABSOLUTE COUNT: 0.6 10*9/L (ref 0.1–1.0)
MONOCYTES RELATIVE PERCENT: 13 %
NEUTROPHILS ABSOLUTE COUNT: 3.1 10*9/L (ref 1.7–7.7)
NEUTROPHILS RELATIVE PERCENT: 68.6 %
PLATELET COUNT: 145 10*9/L — ABNORMAL LOW (ref 150–450)
RED BLOOD CELL COUNT: 3.46 10*12/L — ABNORMAL LOW (ref 4.32–5.72)
WBC ADJUSTED: 4.5 10*9/L (ref 3.5–10.5)

## 2020-04-13 LAB — MEAN CORPUSCULAR HEMOGLOBIN CONC: Erythrocyte mean corpuscular hemoglobin concentration:MCnc:Pt:RBC:Qn:Automated count: 32.5

## 2020-04-13 LAB — EGFR CKD-EPI AA MALE
Glomerular filtration rate/1.73 sq M.predicted.black:ArVRat:Pt:Ser/Plas/Bld:Qn:Creatinine-based formula (CKD-EPI): 17 — ABNORMAL LOW

## 2020-04-13 MED ORDER — CITALOPRAM 20 MG TABLET
ORAL_TABLET | ORAL | 3 refills | 0.00000 days | Status: CP
Start: 2020-04-13 — End: 2021-04-13
  Filled 2020-04-13: qty 30, 30d supply, fill #0

## 2020-04-13 MED ORDER — FLUTICASONE PROPIONATE 50 MCG/ACTUATION NASAL SPRAY,SUSPENSION
Freq: Every day | NASAL | 3 refills | 0.00000 days | Status: CN
Start: 2020-04-13 — End: 2021-04-13

## 2020-04-13 MED ORDER — TRAZODONE 50 MG TABLET
ORAL_TABLET | Freq: Every evening | ORAL | 3 refills | 90.00000 days | Status: CP
Start: 2020-04-13 — End: 2020-05-13
  Filled 2020-04-13: qty 30, 30d supply, fill #0

## 2020-04-13 MED FILL — CITALOPRAM 20 MG TABLET: 30 days supply | Qty: 30 | Fill #0 | Status: AC

## 2020-04-13 MED FILL — TRAZODONE 50 MG TABLET: 30 days supply | Qty: 30 | Fill #0 | Status: AC

## 2020-04-13 NOTE — Unmapped (Signed)
Internal Medicine Clinic Visit    Reason for visit: ***    A/P:    ***    There are no diagnoses linked to this encounter.      __________________________________________________________    HPI:    ***  __________________________________________________________    Problem List:  Patient Active Problem List   Diagnosis   ??? CKD (chronic kidney disease), stage V (CMS-HCC)   ??? Type II diabetes mellitus with renal manifestations, uncontrolled (CMS-HCC)   ??? Essential (primary) hypertension   ??? Hyperlipidemia   ??? Vitamin D deficiency   ??? Gout   ??? Left ankle pain   ??? Sensorineural hearing loss of right ear   ??? Tinnitus   ??? Hypoglycemia associated with type 2 diabetes mellitus (CMS-HCC)   ??? Uremia   ??? Episode of recurrent major depressive disorder (CMS-HCC)   ??? Leg muscle spasm   ??? MDD (major depressive disorder)   ??? Anemia   ??? CHF exacerbation (CMS-HCC)   ??? Hyperkalemia   ??? Pulmonary edema   ??? Rectal bleeding   ??? Thrombocytopenia (CMS-HCC)   ??? Viral gastroenteritis   ??? Heart failure with reduced ejection fraction (CMS-HCC)       Medications:  Reviewed in EPIC  __________________________________________________________    Physical Exam:   Vital Signs:  There were no vitals filed for this visit.    Gen: Well appearing, NAD  CV: RRR, no murmurs  Pulm: CTA bilaterally, no crackles or wheezes  Abd: Soft, NTND, normal BS. No HSM.  Ext: No edema  *** today      Mitchell Gray was seen today for follow-up.    Diagnoses and all orders for this visit:    Colon cancer screening  -     Immunochemical Fecal Occult Blood Test (FIT), automated; Future    Type 2 diabetes mellitus with chronic kidney disease on chronic dialysis, with long-term current use of insulin (CMS-HCC)  -     Lipid Panel    Essential (primary) hypertension    CKD (chronic kidney disease) requiring chronic dialysis (CMS-HCC)  -     CBC w/ Differential  -     Comprehensive Metabolic Panel  -     TSH  -     Phosphorus Level  -     Vitamin D 25 Hydroxy (25OH D2 + D3)  -     Hepatitis B surface antibody    Heart failure with reduced ejection fraction (CMS-HCC)    Pure hypercholesterolemia  -     Lipid Panel    Major depressive disorder, remission status unspecified, unspecified whether recurrent  -     traZODone (DESYREL) 50 MG tablet; Take 1 tablet (50 mg total) by mouth nightly.    Other orders  -     POCT Glycosylated Hemoglobin (HGB A1c)  -     citalopram (CELEXA) 20 MG tablet; TAKE 1 TABLET (20 MG TOTAL) BY MOUTH DAILY.          __________________________________________________________    HPI:    [name] is a 64 year old man with a history of CKD on hemodialysis, type 2 diabetes on long-term insulin use, hyperlipidemia, CHF with reduced ejection fraction (35-40% EF) who presents today to re-establish care. The interview was conducted with assistance of a spanish interpreter.    [name] was last seen through telehealth in 05/2019 following a hospitalization where he was diagnosed with HFrEF. At that time he was not sure what medications he was supposed to be taking  and an in-person follow up was scheduled. However his son passed away and he was unable to make this follow up appointment.     Today [name] reports his health is overall okay. He continues to go to the dialysis center MWF and produces a small amount of urine still. His dialysis center is in [place] but he does not know its name. Care everywhere mentions [facility] [place]. He sees doctors at the center, but has not seen a doctor other than this.     [name] is unsure of the names of any of his medications. He thinks he might be taking Phoslo, Sevelamer. He is using insulin for his diabetes and is taking 70/30 12U in morning and 10U in evening. He has had some low BGs but usually after dialysis.     He does not think he is taking any medicines for high BP or for his heart. He ran out of many medications and did not have anyone to refill them.     He also has been struggling with anxiety and depression, especially after the recent death of his son. He feels very anxious at night and has been having a lot of difficulty sleeping. He used to take celexa and this helped but he is out of this medication.   __________________________________________________________    Problem List:  Patient Active Problem List   Diagnosis   ??? CKD (chronic kidney disease) requiring chronic dialysis (CMS-HCC)   ??? Type 2 diabetes mellitus with chronic kidney disease on chronic dialysis, with long-term current use of insulin (CMS-HCC)   ??? Essential (primary) hypertension   ??? Hyperlipidemia   ??? Vitamin D deficiency   ??? Gout   ??? Sensorineural hearing loss of right ear   ??? Tinnitus   ??? MDD (major depressive disorder)   ??? Anemia   ??? Rectal bleeding   ??? Thrombocytopenia (CMS-HCC)   ??? Heart failure with reduced ejection fraction (CMS-HCC)       Medications:  Reviewed in Epic-> medication reconciliation not able to be fully completed given patient was not sure what medications he has at home.   __________________________________________________________    Physical Exam:   Vital Signs:  Vitals:    04/13/20 1351   BP: 147/65   Pulse: 72   Temp: 36.1 ??C   TempSrc: Skin   SpO2: 98%   Weight: 96.2 kg (212 lb)   Height: 170.2 cm (5' 7)       Gen: Well appearing, NAD, tapping leg frequently during interview and somewhat anxious at times  CV: RRR, referred sounds from fistula appreciable with ausculation of the chest, otherwise no m/r/g.   Pulm: normal work of breathing on room air. Fine crackles in the lower lung bases L>R.  Abd: Soft, NTND, normal BS. No HSM.  Ext: 2+ pitting in LE's bilaterally  Skin: Left arm with band over fistula site.

## 2020-04-13 NOTE — Unmapped (Signed)
[facility]       Type of visit:  face to face    Reason for visit: follow up    Questions / Concerns that need to be addressed: none voiced    General Consent to Treat (GCT) for non-epic video visits only: Verbal consent    Screening BP- 160/76    Allergies reviewed: Yes    Medication reviewed: Yes  Pended refills? No    HCDM reviewed and updated in Epic:    We are working to make sure all of our patients??? wishes are updated in Epic and part of that is documenting a Environmental health practitioner for each patient  A Health Care Decision Rodena Piety is someone you choose who can make health care decisions for you if you are not able ??? who would you most want to do this for you????  is already up to date.    BPAs completed:  PHQ9  HARK - Interpersonal Violence  Falls Risk - adults 65+  Diabetes - Foot Exam  Diabetes - POC A1c  GAD7    COVID Vaccination:  If Care [place] for COVID vaccine is present:  Have you received a COVID vaccine? yes    __________________________________________________________________________________________    SCREENINGS COMPLETED IN FLOWSHEETS    HARK Screening  HARK Screening  Within the last year, have you been humiliated or emotionally abused in other ways by your partner or ex-partner?: No  Within the last year, have you been afraid of your partner or ex-partner?: No  Within the last year, have you been raped or forced to have any kind of sexual activity by your partner or ex-partner?: No  Within the last year, have you been kicked, hit, slapped, or otherwise physically hurt by your partner or ex-partner?: No    PHQ9  Thoughts that you would be better off dead, or of hurting yourself in some way: Not at all  PHQ-9 TOTAL SCORE: 10    GAD7  GAD-7 Total Score: 15

## 2020-04-13 NOTE — Unmapped (Addendum)
(1) For your sleep, you can try melatonin. You can buy this over the counter.   (2) We will schedule you an appointment for August to see Dr. Aletha Halim again. Please bring all of your medication bottles with you to this appointment.  (3) Stop by the pharmacy to pick up prescriptions for Celexa and [facility]  [address]  [place], [address]  Phone: [phone]  Toll Free: [phone]  Fax: [phone]    Thank you for choosing [facility] for your care.    Important Numbers    Main Clinic: [phone] or toll free [phone]    After Hours, Weekend, or Holidays:  ?? Call the [facility] 24/7 Nursing Line [phone] or toll free [phone] to get nurse advice.  ?? Go to [facility] walk-in clinic at [address], [address], [place], [place]; [phone]; 7 days a week from 9:00AM - 8:00PM.  ?? Go to [facility] at [facility] at [address], [place], [place]; [phone]; Mon-Fri 7:00AM-9:00PM, Sat-Sun 12:00PM-5:00PM  ?? Go to [facility] for sprains and strains, joint pain, sports injuries and possible fractures at [address], [address], [place], [place]; [phone]; Mon-Thurs 8:00AM-7:00PM, Fri 8:00AM-5:00PM      Care Manager  I'm having trouble with my health because of cost, my mood, trouble getting to clinic, or where I live. How can I get help? Your Care Manager can help!  ??? Call Jennell Corner, LCSW(Bilingual) at [phone] or Carolyne Littles, LCSW-A at [phone] for assistance.  ??? They are available Monday-Friday 8:00AM -5:00PM      [facility] Counselor: Ivor Costa [phone]  [facility] Counselor (Bilingual): Doyce Para [phone]    [facility] Pharmacy Assistance([place] PAP): [phone]  [facility]: [phone] *Pharmacy can mail medications to your home. You must call to request the medication be mailed.[facility]: [phone]  [facility]: [phone]     [facility]  I'm feeling unsafe, have experienced physical abuse, threats, emotional abuse, sexual abuse or other violence. Who do I call for confidential advice and assistance?  ??? Call [phone] Monday through Fridays 9:00am-4:30pm. Call [phone] after hours.    Same Day Clinic   I'm sick today and need an appointment during office hours. Who do I call?  ?? Call [phone] or toll free [phone], ask for an appointment in the Same Day Clinic  ?? Same Day Clinic is located on the 5th floor at [address], [place] [address]    How do I request medication refills?  Request a refill via [facility] (patient portal), call clinic at [phone] or have your pharmacy fax the request to [phone].        We highly encourage those with internet access to sign up for My [facility] Chart, our new patient portal service.  This service is free to all [facility] patients and offers the following benefits:  ?? Secure messaging with your care team  ?? Request appointments/cancel appointments  ?? Access test results  ?? Request prescription refills  ?? Pay bills online  ?? Manage the health of loved ones  ?? Track your health    Sign up for your My [facility] Chart account at [email or web address].  Free Android  and iOs smartphone and tablet applications are also available for your convenience.

## 2020-04-13 NOTE — Unmapped (Signed)
[facility] at [facility]       Type of visit:  {Telephone Video Face to face:71720}    Reason for visit: ***    Questions / Concerns that need to be addressed: ***    General Consent to Treat (GCT) for non-epic video visits only: {IMC ZOX:09604}      Diabetes:  ??? Regularly checking blood sugars?: {Blank multiple:19197::yes,no,***}  o If yes, when? Complete log for past 7 days  Date Before Breakfast After Breakfast Before Lunch After Lunch Before Dinner After Dinner Before Bed                                                                                                                                     Hypertension:  ??? Have blood pressure cuff at home?: {Blank multiple:19197::yes- arm cuff,yes- wrist cuff,no,***}  ??? Regularly checking blood pressure?: {Blank multiple:19197::yes,no,***}  If yes, complete log for past 7 days  Date Time BP Pulse                                                                             Screening BP- ***        [place]ron BPs (complete if screening BP has a systolic  > 139 or diastolic > 89)  BP#1 ***   BP#2 ***  BP#3 ***    Average BP ***  (please note this as a comment in vitals)         Allergies reviewed: {yes/no/NA:21268}    Medication reviewed: {yes/no/NA:21268}  Pended refills? {VWU:98119}        HCDM reviewed and updated in Epic:    We are working to make sure all of our patients??? wishes are updated in Epic and part of that is documenting a Environmental health practitioner for each patient  A Health Care Decision Rodena Piety is someone you choose who can make health care decisions for you if you are not able ??? who would you most want to do this for you????  {Updated:56707}        BPAs completed:  {JYNWGNF:62130}      COVID-19 Vaccine Summary  Type:  Dates Given:                   If no: Are you interested in scheduling? {COVIDVACCINEINTENT:75248}    Immunization History   Administered Date(s) Administered   ??? INFLUENZA TIV (TRI) PF (IM) 10/23/2011   ??? Influenza Vaccine Quad (IIV4 PF) 48mo+ injectable 08/22/2015   ??? Influenza Virus Vaccine, unspecified formulation 08/03/2018   ??? PNEUMOCOCCAL POLYSACCHARIDE 23 10/23/2011   ??? PPD Test 08/22/2015   ???  [place] IM Fibroblast Culture 02/15/2020       __________________________________________________________________________________________    SCREENINGS COMPLETED IN FLOWSHEETS    HARK Screening       AUDIT       PHQ2       PHQ9          P4 Suicidality Screener                GAD7       COPD Assessment       Falls Risk

## 2020-04-14 LAB — THYROID STIMULATING HORMONE: Thyrotropin:ACnc:Pt:Ser/Plas:Qn:: 2.682

## 2020-04-15 LAB — HEPATITIS B SURFACE ANTIBODY
HEPATITIS B SURFACE ANTIBODY: REACTIVE — AB
Hepatitis B virus surface Ab:PrThr:Pt:Ser:Ord:: REACTIVE — AB

## 2020-04-16 LAB — LIPID PANEL
CHOLESTEROL/HDL RATIO SCREEN: 2.5 (ref 1.0–4.5)
HDL CHOLESTEROL: 47 mg/dL (ref 40–60)
NON-HDL CHOLESTEROL: 72 mg/dL (ref 70–130)
VLDL CHOLESTEROL CAL: 15.4 mg/dL (ref 12–42)

## 2020-04-16 LAB — PHOSPHORUS: Phosphate:MCnc:Pt:Ser/Plas:Qn:: 4

## 2020-04-16 LAB — VLDL CHOLESTEROL CAL: Cholesterol.in VLDL:MCnc:Pt:Ser/Plas:Qn:Calculated: 15.4

## 2020-04-16 NOTE — Unmapped (Signed)
I saw and evaluated the patient, participating in the key portions of the service.  I reviewed the resident’s note.  I agree with the resident’s findings and plan. Abdishakur Gottschall E Kimel-Scott, MD

## 2020-04-17 LAB — VITAMIN D, TOTAL (25OH): Lab: 12.4 — ABNORMAL LOW

## 2020-04-18 NOTE — Unmapped (Signed)
Called patient to make an appt with Toney Rakes for medrec. Leave a vm for pt to call us back.7/21 evy

## 2020-04-20 NOTE — Unmapped (Signed)
2nd attempt-Called patient to make an appt video appt with Toney Rakes for medrec. Leave a vm for pt to call us back

## 2020-04-28 ENCOUNTER — Ambulatory Visit: Admit: 2020-04-28 | Discharge: 2020-04-29

## 2020-05-04 NOTE — Unmapped (Signed)
[facility] Clinical Assessment & Refill Coordination Note    Gwen Edler [place], [place]: 10-Dec-1955  Phone: [phone] (home)     All above HIPAA information was verified with patient's family member, daughter, [name].     Was a Nurse, learning disability used for this call? No    Specialty Medication(s):   Transplant: cinacalcet     Current Outpatient Medications   Medication Sig Dispense Refill   ??? amLODIPine (NORVASC) 5 MG tablet Take 1 tablet by mouth at bedtime 90 tablet 3   ??? aspirin (ECOTRIN) 81 MG tablet Take 1 tablet (81 mg total) by mouth daily. 30 tablet 11   ??? blood sugar diagnostic (GLUCOSE BLOOD) Strp Test blood sugar 3 times daily - before breakfast, lunch and dinner and with signs/symptoms of hypoglycemia. 240 strip 3   ??? calcium acetate,phosphat bind, (PHOSLO) 667 mg capsule Take 3 capsules (2,001 mg total) by mouth Three (3) times a day. 270 capsule 11   ??? carvediloL (COREG) 3.125 MG tablet Take 6.25 mg by mouth. (Patient not taking: Reported on 04/13/2020)     ??? carvediloL (COREG) 6.25 MG tablet Take 1 tablet by mouth twice a day (Patient not taking: Reported on 04/13/2020) 180 tablet 3   ??? cinacalcet (SENSIPAR) 30 MG tablet Take 1 tablet by mouth once a day (Patient not taking: Reported on 04/13/2020) 90 tablet 3   ??? citalopram (CELEXA) 20 MG tablet TAKE 1 TABLET (20 MG TOTAL) BY MOUTH DAILY. 90 tablet 3   ??? ethyl chloride 100 % spray Apply over AV fistula immediately prior to cannulation TIW. 103.5 mL 11   ??? furosemide (LASIX) 80 MG tablet Take 1 tablet (80 mg total) by mouth daily. 30 tablet 5   ??? gabapentin (NEURONTIN) 300 MG capsule Take 1 capsule (300 mg total) by mouth daily. 30 capsule 11   ??? insulin aspart protamine-insulin aspart (NOVOLOG 70/30) 100 unit/mL (70-30) injection pen Inject 10 units under the skin once daily in the morning before meal and inject 5 units every night before meal. (Patient taking differently: Inject 22 Units under the skin daily. Inject 12 units in the morning before meal and inject 10 units every night before meal) 15 mL 11   ??? insulin syringe-needle U-100 Syrg Inject insulin BID 200 each 3   ??? nut.tx.imp.renal fxn,lac-reduc 0.08 gram-1.8 kcal/mL Liqd Take 237 mLs by mouth 2 (two) times daily between meals. 237 mL 0   ??? pen needle, diabetic 31 gauge x 5/16 (8 mm) Ndle Use as directed for insulin administration twice daily 400 each 4   ??? sevelamer (RENVELA) 800 mg tablet Take 4 tablets (3,200 mg total) by mouth Three (3) times a day with a meal. (Patient not taking: Reported on 04/13/2020) 540 tablet 5   ??? sevelamer (RENVELA) 800 mg tablet Take 4 tablets (3200mg ) by mouth three times a day with meals, and take 1 tablet (800mg ) with snacks (Patient not taking: Reported on 04/13/2020) 1080 tablet 11   ??? traZODone (DESYREL) 50 MG tablet Take 1 tablet (50 mg total) by mouth nightly. 90 tablet 3   ??? vit B cmplx-FA-vit C-biotin, renal, (RENA-VITE RX) 1-60-300 mg-mg-mcg Tab Take 1 tablet by mouth nightly at bedtime. 30 tablet 0     No current facility-administered medications for this visit.        Changes to medications: Cypher reports no changes at this time.    No Known Allergies    Changes to allergies: No  SPECIALTY MEDICATION ADHERENCE     cinacalcet 30 mg: unknown # of days of medicine on hand  (likely ~10)           Specialty medication(s) dose(s) confirmed: Regimen is correct and unchanged.     Are there any concerns with adherence? No    Adherence counseling provided? Not needed    CLINICAL MANAGEMENT AND INTERVENTION      Clinical Benefit Assessment:    Do you feel the medicine is effective or helping your condition? Yes    Clinical Benefit counseling provided? Not needed    Adverse Effects Assessment:    Are you experiencing any side effects? No    Are you experiencing difficulty administering your medicine? No    Quality of Life Assessment:    How many days over the past month did your condition  keep you from your normal activities? For example, brushing your teeth or getting up in the morning. 0    Have you discussed this with your provider? Not needed    Therapy Appropriateness:    Is therapy appropriate? Yes, therapy is appropriate and should be continued    DISEASE/MEDICATION-SPECIFIC INFORMATION      N/A    PATIENT SPECIFIC NEEDS     - Does the patient have any physical, cognitive, or cultural barriers? No    - Is the patient high risk? No    - Does the patient require a Care Management Plan? No     - Does the patient require physician intervention or other additional services (i.e. nutrition, smoking cessation, social work)? No      SHIPPING     Specialty Medication(s) to be Shipped:   Transplant: cinacalcet    Other medication(s) to be shipped: None scheduled but daughter may call back to add to the order     Changes to insurance: No    Delivery Scheduled: Yes, Expected medication delivery date: 05/10/20.     Medication will be delivered via Next Day Courier to the confirmed prescription address in [facility].    The patient will receive a drug information handout for each medication shipped and additional FDA Medication Guides as required.  Verified that patient has previously received a Conservation officer, historic buildings.    All of the patient's questions and concerns have been addressed.    Arnold Long   [facility] Specialty Pharmacist

## 2020-05-09 MED FILL — CINACALCET 30 MG TABLET: ORAL | 30 days supply | Qty: 30 | Fill #1

## 2020-05-09 MED FILL — CINACALCET 30 MG TABLET: 30 days supply | Qty: 30 | Fill #1 | Status: AC

## 2020-05-22 MED ORDER — AMLODIPINE 5 MG TABLET
ORAL_TABLET | Freq: Every evening | ORAL | 3 refills | 90 days | Status: CN
Start: 2020-05-22 — End: ?

## 2020-05-22 MED ORDER — GABAPENTIN 300 MG CAPSULE
ORAL_CAPSULE | Freq: Every day | ORAL | 11 refills | 30.00000 days | Status: CN
Start: 2020-05-22 — End: ?

## 2020-05-22 MED ORDER — ASPIRIN 81 MG TABLET,DELAYED RELEASE
ORAL_TABLET | Freq: Every day | ORAL | 11 refills | 30.00000 days
Start: 2020-05-22 — End: 2021-05-22

## 2020-05-22 MED ORDER — TRAZODONE 50 MG TABLET
ORAL_TABLET | Freq: Every evening | ORAL | 3 refills | 90.00000 days
Start: 2020-05-22 — End: 2021-05-17

## 2020-05-22 MED ORDER — SEVELAMER CARBONATE 800 MG TABLET
ORAL_TABLET | Freq: Three times a day (TID) | ORAL | 3 refills | 90.00000 days
Start: 2020-05-22 — End: 2021-05-22

## 2020-05-22 MED ORDER — LISINOPRIL 20 MG TABLET
ORAL_TABLET | Freq: Every day | ORAL | 3 refills | 90.00000 days
Start: 2020-05-22 — End: ?

## 2020-05-22 NOTE — Unmapped (Deleted)
Internal Medicine Clinic Visit    sensipar 30 qd, calcitriol 0.25 three times a day (? note sure if thats a typo or not), 3200 of sevelamr will meals (he most have terrible phos/secondary PTH issues), gabapentin 300 daily, amlodipine 5 qd, carvedilol 6.25 mcg, lisinopril 20, celexa is listed twice one 20 mg and one 10 mg dose daily, 70/30 insulin 15 u qAM, 8 qPM, lasix 40 daily, tramadol 50 prn,      Reason for visit: Follow-up    A/P:    CKD on hemodialysis: MWF dialysis in [place] (most likely at [facility])  -Will work to get medical records from dialysis center to help with med rec at f/u visit    T2DM: HgA1C 7.3% 03/2020.  -Continue 70/30 insulin  -Will refill insulin today as med rec shows only one refill remaining and patient has poor access to medications.   -Foot exam done 03/2020  -Retinal Imaging***    CHF with combined systolic and diastolic dysfunction (EF 35-40%): Last echo looks to have been done in 04/2019 with records in care everywhere from [facility]. Patient has not seen a cardiologist and does not know if on any medications for his heart. Given patient is dialysis dependent, not a candidate for statin therapy per ACC/AHA guidelines.   -Re-start Carvedilol 3.125 mg twice daily; up-titrate as tolerated at next follow-up  -Re-start Lisinopril 10 mg daily; up-titrate as tolerated at next follow-up  -***Lasix  -Repeat echo ordered today  -Cards referral placed    HTN: elevated in clinic today ***/***.  -Re-start Lisinopril 10 mg daily; up-titrate at follow-up appointment  -Can consider re-starting Amlodipine at follow-up appointment, but will prioritize ACE-I today.    History of MDD, Anxiety, recent bereavement: Patient recently lost his son to suicide. He has been having more difficulty with mood, sleep, and anxiety. He previously took celexa but is out of this medication for some time.   -Continue Celexa 20mg   -Continue Trazadone 50mg  prn for sleep    HCM:  -COVID vaccine he reports done at dialysis center- will try to get records  -Tdap- Thinks had this last in Dec 2020 at dialysis center- will try to get records.  -FIT card given in clinic on 7/16***  -Hep B sAb reactive.  -Retinal imaging***  -Lipid panel done 7/16; LDL 57; not a candidate for statin    There are no diagnoses linked to this encounter.      __________________________________________________________    HPI:    [name] is a 64 year old man with a history of CKD on hemodialysis, type 2 diabetes on long-term insulin use, hyperlipidemia, CHF with reduced ejection fraction (35-40% EF) who presents today to re-establish care. The interview was conducted with assistance of a spanish interpreter.    ***  __________________________________________________________    Problem List:  Patient Active Problem List   Diagnosis   ??? CKD (chronic kidney disease) requiring chronic dialysis (CMS-HCC)   ??? Type 2 diabetes mellitus with chronic kidney disease on chronic dialysis, with long-term current use of insulin (CMS-HCC)   ??? Essential (primary) hypertension   ??? Hyperlipidemia   ??? Vitamin D deficiency   ??? Gout   ??? Sensorineural hearing loss of right ear   ??? Tinnitus   ??? MDD (major depressive disorder)   ??? Anemia   ??? Rectal bleeding   ??? Thrombocytopenia (CMS-HCC)   ??? Heart failure with reduced ejection fraction (CMS-HCC)       Medications:  Reviewed in Epic-> medication reconciliation  not able to be fully completed given patient was not sure what medications he has at home.     Medications as listed in dialysis MAR:  Cinacalcet 30mg  every day  Calcitriol 0.25 tid  Sevelamr 3200 with meals  Gabapentin 300mg  daily  Amlodipine 5mg  daily  Carvedilol 6.70mcg daily  Lisinopril 20mg  daily  Celexa 20mg  daily  70/30 Insulin 12U AM, 10U PM***  Lasix 40mg  daily  Tramadol 50mg  prn  __________________________________________________________    Physical Exam:   Vital Signs:  There were no vitals filed for this visit.    Gen: Well appearing, NAD, tapping leg frequently during interview and somewhat anxious at times  CV: RRR, referred sounds from fistula appreciable with ausculation of the chest, otherwise no m/r/g.   Pulm: normal work of breathing on room air. Fine crackles in the lower lung bases L>R.  Abd: Soft, NTND, normal BS. No HSM.  Ext: 2+ pitting in LE's bilaterally  Skin: Left arm with band over fistula site.

## 2020-05-23 MED ORDER — ASPIRIN 81 MG TABLET,DELAYED RELEASE
ORAL_TABLET | Freq: Every day | ORAL | 11 refills | 30 days | Status: CN
Start: 2020-05-23 — End: 2021-05-23

## 2020-05-23 MED ORDER — TRAZODONE 50 MG TABLET
ORAL_TABLET | Freq: Every evening | ORAL | 3 refills | 90.00000 days
Start: 2020-05-23 — End: 2021-05-18

## 2020-05-23 MED ORDER — INSULIN ASPAR PROT-INSULIN ASPART 100 UNIT/ML (70-30) SUBCUTANEOUS PEN
11 refills | 0.00000 days
Start: 2020-05-23 — End: ?

## 2020-05-23 MED ORDER — CITALOPRAM 20 MG TABLET
ORAL_TABLET | 3 refills | 0 days
Start: 2020-05-23 — End: 2021-05-23

## 2020-05-23 MED ORDER — SEVELAMER CARBONATE 800 MG TABLET
ORAL_TABLET | Freq: Three times a day (TID) | ORAL | 3 refills | 90.00000 days | Status: CN
Start: 2020-05-23 — End: 2021-05-23

## 2020-05-23 MED ORDER — CALCITRIOL 0.25 MCG CAPSULE
ORAL_CAPSULE | ORAL | 3 refills | 84 days
Start: 2020-05-23 — End: 2021-05-23

## 2020-05-23 MED ORDER — CARVEDILOL 3.125 MG TABLET
ORAL_TABLET | Freq: Two times a day (BID) | ORAL | 1 refills | 90.00000 days
Start: 2020-05-23 — End: 2020-11-19

## 2020-05-24 ENCOUNTER — Ambulatory Visit: Admit: 2020-05-24

## 2020-05-29 ENCOUNTER — Ambulatory Visit: Admit: 2020-05-29 | Discharge: 2020-05-30

## 2020-06-05 NOTE — Unmapped (Signed)
[facility] Refill Coordination Note    Specialty Medication(s) to be Shipped:   Transplant: Sensipar 30mg     Other medication(s) to be shipped: CITALOPRAM, NOVOLOG, TRAZODONE     [address] [place], [place]: 15-Jan-1956  Phone: [phone] (home)       All above HIPAA information was verified with patient's family member, DAUGHTER.     Was a Nurse, learning disability used for this call? No    Completed refill call assessment today to schedule patient's medication shipment from the [facility] [phone]).       Specialty medication(s) and dose(s) confirmed: Regimen is correct and unchanged.   Changes to medications: Itzel reports no changes at this time.  Changes to insurance: No  Questions for the pharmacist: No    Confirmed patient received Welcome Packet with first shipment. The patient will receive a drug information handout for each medication shipped and additional FDA Medication Guides as required.       DISEASE/MEDICATION-SPECIFIC INFORMATION        N/A    SPECIALTY MEDICATION ADHERENCE     Medication Adherence    Patient reported X missed doses in the last month: 0  Specialty Medication: SENSIPAR 30 MG   Patient is on additional specialty medications: No  Informant: child/children  Confirmed plan for next specialty medication refill: delivery by pharmacy  Refills needed for supportive medications: not needed          Refill Coordination    Has the Patients' Contact Information Changed: No  Is the Shipping Address Different: No           SENSIPAR 30 mg: 3 days of medicine on hand         SHIPPING     Shipping address confirmed in Epic.     Delivery Scheduled: Yes, Expected medication delivery date: 9/9.     Medication will be delivered via UPS to the prescription address in Epic WAM.    Jolene Schimke   [facility] Specialty Technician

## 2020-06-06 MED FILL — CINACALCET 30 MG TABLET: 30 days supply | Qty: 30 | Fill #2 | Status: AC

## 2020-06-06 MED FILL — TRAZODONE 50 MG TABLET: ORAL | 90 days supply | Qty: 90 | Fill #0

## 2020-06-06 MED FILL — NOVOLOG MIX 70-30 FLEXPEN U-100 INSULIN 100 UNIT/ML SUBCUTANEOUS PEN: 100 days supply | Qty: 15 | Fill #2 | Status: AC

## 2020-06-06 MED FILL — CITALOPRAM 20 MG TABLET: 90 days supply | Qty: 90 | Fill #0 | Status: AC

## 2020-06-06 MED FILL — NOVOLOG MIX 70-30 FLEXPEN U-100 INSULIN 100 UNIT/ML SUBCUTANEOUS PEN: SUBCUTANEOUS | 100 days supply | Qty: 15 | Fill #2

## 2020-06-06 MED FILL — CINACALCET 30 MG TABLET: ORAL | 30 days supply | Qty: 30 | Fill #2

## 2020-06-06 MED FILL — CITALOPRAM 20 MG TABLET: 90 days supply | Qty: 90 | Fill #0

## 2020-06-06 MED FILL — TRAZODONE 50 MG TABLET: 90 days supply | Qty: 90 | Fill #0 | Status: AC

## 2020-06-13 ENCOUNTER — Other Ambulatory Visit: Payer: Self-pay

## 2020-06-15 MED ORDER — SEVELAMER CARBONATE 800 MG TABLET
ORAL_TABLET | Freq: Three times a day (TID) | ORAL | 5 refills | 45.00000 days | Status: CP
Start: 2020-06-15 — End: ?

## 2020-06-28 ENCOUNTER — Ambulatory Visit: Admit: 2020-06-28 | Discharge: 2020-06-29

## 2020-06-29 NOTE — Unmapped (Signed)
[facility] Refill Coordination Note    Specialty Medication(s) to be Shipped:   Transplant: Sensipar 30mg     Other medication(s) to be shipped: [facility], [place]: 16-Sep-1956  Phone: [phone] (home)       All above HIPAA information was verified with patient's family member, Quincy Sheehan .     Was a Nurse, learning disability used for this call? No    Completed refill call assessment today to schedule patient's medication shipment from the [facility] [phone]).       Specialty medication(s) and dose(s) confirmed: Regimen is correct and unchanged.   Changes to medications: Rainen reports no changes at this time.  Changes to insurance: No  Questions for the pharmacist: No    Confirmed patient received Welcome Packet with first shipment. The patient will receive a drug information handout for each medication shipped and additional FDA Medication Guides as required.       DISEASE/MEDICATION-SPECIFIC INFORMATION        N/A    SPECIALTY MEDICATION ADHERENCE     Medication Adherence    Patient reported X missed doses in the last month: 0  Specialty Medication: SENISPAR 30MG    Informant: child/children  Confirmed plan for next specialty medication refill: delivery by pharmacy  Refills needed for supportive medications: not needed          Refill Coordination    Has the Patients' Contact Information Changed: No  Is the Shipping Address Different: No           SENSIPAR 30 mg: 7 days of medicine on hand          SHIPPING     Shipping address confirmed in Epic.     Delivery Scheduled: Yes, Expected medication delivery date: 10/7.     Medication will be delivered via UPS to the prescription address in Epic WAM.    Jolene Schimke   [facility] Specialty Technician

## 2020-06-30 MED ORDER — GABAPENTIN 300 MG CAPSULE
ORAL_CAPSULE | Freq: Every day | ORAL | 11 refills | 30.00000 days
Start: 2020-06-30 — End: ?

## 2020-07-02 MED ORDER — SEVELAMER CARBONATE 800 MG TABLET
ORAL_TABLET | 11 refills | 0.00000 days | Status: CP
Start: 2020-07-02 — End: 2021-07-02
  Filled 2020-07-04: qty 360, 30d supply, fill #0

## 2020-07-02 MED ORDER — SEVELAMER CARBONATE 800 MG TABLET: tablet | 11 refills | 0 days | Status: AC

## 2020-07-04 MED FILL — CINACALCET 30 MG TABLET: ORAL | 30 days supply | Qty: 30 | Fill #3

## 2020-07-04 MED FILL — CINACALCET 30 MG TABLET: 30 days supply | Qty: 30 | Fill #3 | Status: AC

## 2020-07-04 MED FILL — SEVELAMER CARBONATE 800 MG TABLET: 30 days supply | Qty: 360 | Fill #0 | Status: AC

## 2020-07-19 MED ORDER — CITALOPRAM 20 MG TABLET
ORAL_TABLET | 3 refills | 0 days | Status: CP
Start: 2020-07-19 — End: 2021-07-19
  Filled 2020-08-17: qty 90, 90d supply, fill #0

## 2020-07-26 NOTE — Unmapped (Signed)
[facility] Refill Coordination Note    Specialty Medication(s) to be Shipped:   Transplant: Sensipar 30mg     Other medication(s) to be shipped: renvela and citalopram     [address] [place], [place]: 11/07/1955  Phone: [phone] (home)       All above HIPAA information was verified with patient's family member, daughter .     Was a Nurse, learning disability used for this call? No    Completed refill call assessment today to schedule patient's medication shipment from the [facility] [phone]).       Specialty medication(s) and dose(s) confirmed: Regimen is correct and unchanged.   Changes to medications: Imani reports no changes at this time.  Changes to insurance: No  Questions for the pharmacist: No    Confirmed patient received Welcome Packet with first shipment. The patient will receive a drug information handout for each medication shipped and additional FDA Medication Guides as required.       DISEASE/MEDICATION-SPECIFIC INFORMATION        N/A    SPECIALTY MEDICATION ADHERENCE     Medication Adherence    Patient reported X missed doses in the last month: 0  Specialty Medication: sensipar 30 mg   Patient is on additional specialty medications: No  Informant: child/children  Confirmed plan for next specialty medication refill: delivery by pharmacy  Refills needed for supportive medications: not needed          Refill Coordination    Has the Patients' Contact Information Changed: No  Is the Shipping Address Different: No           sensipar 30 mg: 10 days of medicine on hand         SHIPPING     Shipping address confirmed in Epic.     Delivery Scheduled: Yes, Expected medication delivery date: 11/4.     Medication will be delivered via UPS to the prescription address in Epic WAM.    Jolene Schimke   [facility] Specialty Technician

## 2020-07-29 ENCOUNTER — Ambulatory Visit: Admit: 2020-07-29 | Discharge: 2020-07-30

## 2020-08-01 MED FILL — SEVELAMER CARBONATE 800 MG TABLET: ORAL | 30 days supply | Qty: 360 | Fill #1

## 2020-08-01 MED FILL — SEVELAMER CARBONATE 800 MG TABLET: 30 days supply | Qty: 360 | Fill #1 | Status: AC

## 2020-08-01 MED FILL — CINACALCET 30 MG TABLET: ORAL | 30 days supply | Qty: 30 | Fill #4

## 2020-08-01 MED FILL — CINACALCET 30 MG TABLET: 30 days supply | Qty: 30 | Fill #4 | Status: AC

## 2020-08-17 MED FILL — CITALOPRAM 20 MG TABLET: 90 days supply | Qty: 90 | Fill #0 | Status: AC

## 2020-08-28 ENCOUNTER — Ambulatory Visit: Admit: 2020-08-28 | Discharge: 2020-09-26

## 2020-08-29 MED ORDER — INSULIN ASPAR PROT-INSULIN ASPART 100 UNIT/ML (70-30) SUBCUTANEOUS PEN
Freq: Every day | SUBCUTANEOUS | 11 refills | 100 days
Start: 2020-08-29 — End: 2021-08-29

## 2020-08-29 NOTE — Unmapped (Signed)
The [facility] has made a SECOND and final attempt to reach this patient to refill the following medication:SENSIPAR.      We have left voicemails on the following phone numbers: [phone] / [phone] and have sent a MyChart message.    Dates contacted: 11/24 12/1  Last scheduled delivery: 11/4    The patient may be at risk of non-compliance with this medication. The patient should call the [facility] at [phone] (option 4) to refill medication.    Print production planner

## 2020-08-30 MED ORDER — INSULIN ASPAR PROT-INSULIN ASPART 100 UNIT/ML (70-30) SUBCUTANEOUS PEN
Freq: Every day | SUBCUTANEOUS | 3 refills | 100 days | Status: CP
Start: 2020-08-30 — End: 2021-08-30
  Filled 2020-09-06: qty 15, 100d supply, fill #0

## 2020-08-30 MED FILL — TRAZODONE 50 MG TABLET: ORAL | 90 days supply | Qty: 90 | Fill #1

## 2020-08-30 MED FILL — CINACALCET 30 MG TABLET: ORAL | 30 days supply | Qty: 30 | Fill #5

## 2020-08-30 MED FILL — CINACALCET 30 MG TABLET: 30 days supply | Qty: 30 | Fill #5 | Status: AC

## 2020-08-30 MED FILL — SEVELAMER CARBONATE 800 MG TABLET: 30 days supply | Qty: 360 | Fill #2 | Status: AC

## 2020-08-30 MED FILL — TRAZODONE 50 MG TABLET: 90 days supply | Qty: 90 | Fill #1 | Status: AC

## 2020-08-30 MED FILL — SEVELAMER CARBONATE 800 MG TABLET: ORAL | 30 days supply | Qty: 360 | Fill #2

## 2020-08-30 NOTE — Unmapped (Signed)
[facility] Refill Coordination Note    Specialty Medication(s) to be Shipped:   Transplant: Cinacalcet 30mg     Other medication(s) to be shipped: sevelamer and trazodone     [address] [place], [place]: 10/28/1955  Phone: [phone] (home)       All above HIPAA information was verified with patient's caregiver, [place]     Was a translator used for this call? No    Completed refill call assessment today to schedule patient's medication shipment from the [facility] [phone]).       Specialty medication(s) and dose(s) confirmed: Regimen is correct and unchanged.   Changes to medications: [place] reports no changes at this time.  Changes to insurance: No  Questions for the pharmacist: No    Confirmed patient received Welcome Packet with first shipment. The patient will receive a drug information handout for each medication shipped and additional FDA Medication Guides as required.       DISEASE/MEDICATION-SPECIFIC INFORMATION        N/A    SPECIALTY MEDICATION ADHERENCE     Medication Adherence    Patient reported X missed doses in the last month: 0  Specialty Medication: Cinacalcet 30mg   Patient is on additional specialty medications: No        Cinacalcet 30 mg: 5 days of medicine on hand     SHIPPING     Shipping address confirmed in Epic.     Delivery Scheduled: Yes, Expected medication delivery date: 08/31/2020.     Medication will be delivered via UPS to the prescription address in Epic WAM.    Lorelei Pont [facility] Specialty Technician

## 2020-09-06 MED FILL — NOVOLOG MIX 70-30 FLEXPEN U-100 INSULIN 100 UNIT/ML SUBCUTANEOUS PEN: 100 days supply | Qty: 15 | Fill #0 | Status: AC

## 2020-09-25 NOTE — Unmapped (Signed)
The [facility] has made a SECOND and final attempt to reach this patient to refill the following medication:CINACALCET 30MG .      We have left voicemails on the following phone numbers: [phone] and have sent a MyChart message.    Dates contacted: 12/23  12/28   Last scheduled delivery: 12/2    The patient may be at risk of non-compliance with this medication. The patient should call the [facility] at [phone] (option 4) to refill medication.    Print production planner

## 2020-10-29 ENCOUNTER — Ambulatory Visit: Admit: 2020-10-29 | Discharge: 2020-10-30

## 2020-11-20 MED ORDER — GABAPENTIN 300 MG CAPSULE
ORAL_CAPSULE | Freq: Every day | ORAL | 11 refills | 30.00000 days
Start: 2020-11-20 — End: ?

## 2020-11-20 MED ORDER — AMLODIPINE 5 MG TABLET
ORAL_TABLET | Freq: Every evening | ORAL | 3 refills | 90.00000 days
Start: 2020-11-20 — End: ?

## 2020-11-20 NOTE — Unmapped (Signed)
[facility] Clinical Assessment & Refill Coordination Note    Mit[place], [place]: December 16, 1955  Phone: [phone] (home)     All above HIPAA information was verified with patient's family member, wife. Patient was also in the background of the phone call providing information.     Was a Nurse, learning disability used for this call? Yes, incoming call with Spanish interpreter. Patient language is appropriate in [facility]    Specialty Medication(s):   Transplant: cinacalcet     Current Outpatient Medications   Medication Sig Dispense Refill   ??? amLODIPine (NORVASC) 5 MG tablet Take 1 tablet by mouth at bedtime 90 tablet 3   ??? aspirin (ECOTRIN) 81 MG tablet Take 1 tablet (81 mg total) by mouth daily. 30 tablet 11   ??? blood sugar diagnostic (GLUCOSE BLOOD) Strp Test blood sugar 3 times daily - before breakfast, lunch and dinner and with signs/symptoms of hypoglycemia. 240 strip 3   ??? carvediloL (COREG) 3.125 MG tablet Take 6.25 mg by mouth. (Patient not taking: Reported on 04/13/2020)     ??? cinacalcet (SENSIPAR) 30 MG tablet Take 1 tablet by mouth once a day (Patient not taking: Reported on 04/13/2020) 90 tablet 3   ??? citalopram (CELEXA) 20 MG tablet Take 1 tablet (20 mg total) by mouth daily. 90 tablet 3   ??? ethyl chloride 100 % spray Apply over AV fistula immediately prior to cannulation TIW. 103.5 mL 11   ??? furosemide (LASIX) 80 MG tablet Take 1 tablet (80 mg total) by mouth daily. 30 tablet 5   ??? gabapentin (NEURONTIN) 300 MG capsule Take 1 capsule (300 mg total) by mouth daily. 30 capsule 11   ??? insulin aspart protamine-insulin aspart (NOVOLOG MIX 70-30FLEXPEN U-100) 100 unit/mL (70-30) injection pen Inject 10 units under the skin once daily in the morning before meal and inject 5 units every night before meal. 15 mL 3   ??? insulin syringe-needle U-100 Syrg Inject insulin BID 200 each 3   ??? pen needle, diabetic 31 gauge x 5/16 (8 mm) Ndle Use as directed for insulin administration twice daily 400 each 4   ??? sevelamer (RENVELA) 800 mg tablet Take 4 tablets (3,200 mg total) by mouth Three (3) times a day with a meal. 540 tablet 5   ??? sevelamer (RENVELA) 800 mg tablet TAKE 4 TABLETS (3200 MG) BY MOUTH THREE TIMES DAILY WITH A MEAL 360 tablet 11   ??? traZODone (DESYREL) 50 MG tablet Take 1 tablet (50 mg total) by mouth nightly. 90 tablet 3     No current facility-administered medications for this visit.        Changes to medications: Asahel reports no changes at this time.    No Known Allergies    Changes to allergies: No    SPECIALTY MEDICATION ADHERENCE     Cinacalcet 30 mg: 0 days of medicine on hand       Medication Adherence    Patient reported X missed doses in the last month: all  Specialty Medication: Cinacalcet 30mg   Informant: spouse          Specialty medication(s) dose(s) confirmed: Regimen is correct and unchanged.     Are there any concerns with adherence? Yes: Patient reports he has been without medication for approximately 2 months. States he received 3 months of some medicines but only one month on cinacalcet.    Adherence counseling provided? Yes: Informed patient that cinacalcet is only dispensed as one month supply. The  pharmacy attempted to contact him on several occasions to refill and arrange for delivery. Mitchell [place] reports it is difficult for him to call back to [facility] sometimes. I verified the phone number we have on file as correct and encouraged Mitchell [place] to answer calls from [facility] to set up refills so as to have no delay in therapy. Mitchell Gray voices understanding.    CLINICAL MANAGEMENT AND INTERVENTION      Clinical Benefit Assessment:    Do you feel the medicine is effective or helping your condition? Yes    Clinical Benefit counseling provided? Not needed    Adverse Effects Assessment:    Are you experiencing any side effects? No    Are you experiencing difficulty administering your medicine? No    Quality of Life Assessment:    How many days over the past month did your condition  keep you from your normal activities? For example, brushing your teeth or getting up in the morning. Patient declined to answer    Have you discussed this with your provider? Not needed    Therapy Appropriateness:    Is therapy appropriate? Yes, therapy is appropriate and should be continued    DISEASE/MEDICATION-SPECIFIC INFORMATION      N/A    PATIENT SPECIFIC NEEDS     - Does the patient have any physical, cognitive, or cultural barriers? No    - Is the patient high risk? No    - Does the patient require a Care Management Plan? No     - Does the patient require physician intervention or other additional services (i.e. nutrition, smoking cessation, social work)? No      SHIPPING     Specialty Medication(s) to be Shipped:   Transplant: cinacalcet    Other medication(s) to be shipped: Sevelamer, Trazodone, Citalopram, Gabapentin, Amlodipine, Novolog     Changes to insurance: No    Delivery Scheduled: Yes, Expected medication delivery date: 11/22/20.  However, Rx request for gabapentin and amlodipine refills sent to the provider as there are none remaining.     Medication will be delivered via Next Day Courier to the confirmed prescription address in [facility].    The patient will receive a drug information handout for each medication shipped and additional FDA Medication Guides as required.  Verified that patient has previously received a Conservation officer, historic buildings.    All of the patient's questions and concerns have been addressed.    Camillo Flaming   [facility] Specialty Pharmacist

## 2020-11-21 MED FILL — TRAZODONE 50 MG TABLET: ORAL | 90 days supply | Qty: 90 | Fill #2

## 2020-11-21 MED FILL — SEVELAMER CARBONATE 800 MG TABLET: ORAL | 30 days supply | Qty: 360 | Fill #3

## 2020-11-21 MED FILL — CITALOPRAM 20 MG TABLET: ORAL | 90 days supply | Qty: 90 | Fill #1

## 2020-11-21 MED FILL — CINACALCET 30 MG TABLET: ORAL | 30 days supply | Qty: 30 | Fill #6

## 2020-11-21 MED FILL — NOVOLOG MIX 70-30 FLEXPEN U-100 INSULIN 100 UNIT/ML SUBCUTANEOUS PEN: SUBCUTANEOUS | 100 days supply | Qty: 15 | Fill #1

## 2020-11-24 MED ORDER — AMLODIPINE 5 MG TABLET
ORAL_TABLET | Freq: Every evening | ORAL | 3 refills | 90 days | Status: CP
Start: 2020-11-24 — End: ?
  Filled 2020-11-26: qty 90, 90d supply, fill #0

## 2020-11-26 ENCOUNTER — Ambulatory Visit: Admit: 2020-11-26 | Discharge: 2020-12-25

## 2020-11-26 MED FILL — GABAPENTIN 300 MG CAPSULE: ORAL | 30 days supply | Qty: 30 | Fill #0

## 2020-12-13 NOTE — Unmapped (Signed)
This patient has been disenrolled from the [facility] specialty pharmacy services due to medication has been removed from [facility].    Arnold Long  [facility] Specialty Pharmacist

## 2020-12-13 NOTE — Unmapped (Signed)
[facility] Clinical Assessment & Refill Coordination Note    [place], [place]: 20-Mar-1956  Phone: [phone] (home)     All above HIPAA information was verified with patient's family member, daughter, [name].     Was a Nurse, learning disability used for this call? No    Specialty Medication(s):   General Specialty: cinacalcet     Current Outpatient Medications   Medication Sig Dispense Refill   ??? amLODIPine (NORVASC) 5 MG tablet Take 1 tablet by mouth at bedtime 90 tablet 3   ??? aspirin (ECOTRIN) 81 MG tablet Take 1 tablet (81 mg total) by mouth daily. 30 tablet 11   ??? blood sugar diagnostic (GLUCOSE BLOOD) Strp Test blood sugar 3 times daily - before breakfast, lunch and dinner and with signs/symptoms of hypoglycemia. 240 strip 3   ??? carvediloL (COREG) 3.125 MG tablet Take 6.25 mg by mouth. (Patient not taking: Reported on 04/13/2020)     ??? cinacalcet (SENSIPAR) 30 MG tablet Take 1 tablet by mouth once a day (Patient not taking: Reported on 04/13/2020) 90 tablet 3   ??? citalopram (CELEXA) 20 MG tablet Take 1 tablet (20 mg total) by mouth daily. 90 tablet 3   ??? ethyl chloride 100 % spray Apply over AV fistula immediately prior to cannulation TIW. 103.5 mL 11   ??? furosemide (LASIX) 80 MG tablet Take 1 tablet (80 mg total) by mouth daily. 30 tablet 5   ??? gabapentin (NEURONTIN) 300 MG capsule Take 1 capsule (300 mg total) by mouth daily. 30 capsule 11   ??? insulin aspart protamine-insulin aspart (NOVOLOG MIX 70-30FLEXPEN U-100) 100 unit/mL (70-30) injection pen Inject 10 units under the skin once daily in the morning before meal and inject 5 units every night before meal. 15 mL 3   ??? insulin syringe-needle U-100 Syrg Inject insulin BID 200 each 3   ??? pen needle, diabetic 31 gauge x 5/16 (8 mm) Ndle Use as directed for insulin administration twice daily 400 each 4   ??? sevelamer (RENVELA) 800 mg tablet Take 4 tablets (3,200 mg total) by mouth Three (3) times a day with a meal. 540 tablet 5   ??? sevelamer (RENVELA) 800 mg tablet TAKE 4 TABLETS (3200 MG) BY MOUTH THREE TIMES DAILY WITH A MEAL 360 tablet 11   ??? traZODone (DESYREL) 50 MG tablet Take 1 tablet (50 mg total) by mouth nightly. 90 tablet 3     No current facility-administered medications for this visit.        Changes to medications: [place] reports no changes at this time.    No Known Allergies    Changes to allergies: No    SPECIALTY MEDICATION ADHERENCE     cinacalcet 30 mg: unknown # of days of medicine on hand (likely ~9)      Medication Adherence    Patient reported X missed doses in the last month: 0  Specialty Medication: cinacalcet  Patient is on additional specialty medications: No  Informant: child/children          Specialty medication(s) dose(s) confirmed: Regimen is correct and unchanged.     Are there any concerns with adherence? No    Adherence counseling provided? Not needed    CLINICAL MANAGEMENT AND INTERVENTION      Clinical Benefit Assessment:    Do you feel the medicine is effective or helping your condition? Unable to determine at this time    Clinical Benefit counseling provided? Not needed  Adverse Effects Assessment:    Are you experiencing any side effects? No    Are you experiencing difficulty administering your medicine? No    Quality of Life Assessment:    How many days over the past month did your conditio  keep you from your normal activities? For example, brushing your teeth or getting up in the morning. 0    Have you discussed this with your provider? Not needed    Therapy Appropriateness:    Is therapy appropriate? Yes, therapy is appropriate and should be continued    DISEASE/MEDICATION-SPECIFIC INFORMATION      N/A    PATIENT SPECIFIC NEEDS     - Does the patient have any physical, cognitive, or cultural barriers? No    - Is the patient high risk? No    - Does the patient require a Care Management Plan? No     - Does the patient require physician intervention or other additional services (i.e. nutrition, smoking cessation, social work)? No      SHIPPING     Specialty Medication(s) to be Shipped:   General Specialty: cinacalcet    Other medication(s) to be shipped: sevelamer, gabapentin     Changes to insurance: No    Delivery Scheduled: Yes, Expected medication delivery date: 12/19/20.     Medication will be delivered via UPS to the confirmed prescription address in [facility].    The patient will receive a drug information handout for each medication shipped and additional FDA Medication Guides as required.  Verified that patient has previously received a Conservation officer, historic buildings.    All of the patient's questions and concerns have been addressed.    Arnold Long   [facility] Specialty Pharmacist

## 2020-12-18 MED FILL — CINACALCET 30 MG TABLET: ORAL | 30 days supply | Qty: 30 | Fill #7

## 2020-12-18 MED FILL — SEVELAMER CARBONATE 800 MG TABLET: ORAL | 30 days supply | Qty: 360 | Fill #4

## 2020-12-18 MED FILL — GABAPENTIN 300 MG CAPSULE: ORAL | 30 days supply | Qty: 30 | Fill #1

## 2021-01-19 DIAGNOSIS — I10 Essential (primary) hypertension: Principal | ICD-10-CM

## 2021-01-29 ENCOUNTER — Ambulatory Visit: Admit: 2021-01-29 | Discharge: 2021-01-30

## 2021-01-29 DIAGNOSIS — N178 Other acute kidney failure: Principal | ICD-10-CM

## 2021-01-29 DIAGNOSIS — F329 Major depressive disorder, single episode, unspecified: Principal | ICD-10-CM

## 2021-01-29 DIAGNOSIS — I10 Essential (primary) hypertension: Principal | ICD-10-CM

## 2021-01-29 MED ORDER — TRAZODONE 50 MG TABLET
ORAL_TABLET | Freq: Every evening | ORAL | 3 refills | 90.00000 days | Status: CN
Start: 2021-01-29 — End: 2021-02-28

## 2021-01-29 MED ORDER — SEVELAMER CARBONATE 800 MG TABLET
ORAL_TABLET | Freq: Three times a day (TID) | ORAL | 5 refills | 45 days | Status: CN
Start: 2021-01-29 — End: ?

## 2021-01-29 MED ORDER — CARVEDILOL 3.125 MG TABLET
ORAL_TABLET | Freq: Two times a day (BID) | ORAL | 3 refills | 90.00000 days | Status: CP
Start: 2021-01-29 — End: ?
  Filled 2021-02-15: qty 60, 30d supply, fill #0

## 2021-01-29 MED ORDER — CITALOPRAM 20 MG TABLET
ORAL_TABLET | Freq: Every day | ORAL | 3 refills | 90.00000 days | Status: CN
Start: 2021-01-29 — End: 2022-01-29

## 2021-01-29 MED ORDER — GABAPENTIN 300 MG CAPSULE
ORAL_CAPSULE | Freq: Every day | ORAL | 11 refills | 30 days | Status: CN
Start: 2021-01-29 — End: ?

## 2021-01-29 MED ORDER — INSULIN ASPAR PROT-INSULIN ASPART 100 UNIT/ML (70-30) SUBCUTANEOUS PEN
Freq: Every day | SUBCUTANEOUS | 3 refills | 100.00000 days | Status: CN
Start: 2021-01-29 — End: 2022-01-29

## 2021-01-29 MED ORDER — FUROSEMIDE 80 MG TABLET
ORAL_TABLET | Freq: Every day | ORAL | 5 refills | 30.00000 days | Status: CN
Start: 2021-01-29 — End: 2022-01-29

## 2021-01-29 MED ORDER — AMLODIPINE 5 MG TABLET
ORAL_TABLET | Freq: Every evening | ORAL | 3 refills | 90.00000 days | Status: CN
Start: 2021-01-29 — End: ?

## 2021-01-29 MED ORDER — CINACALCET 30 MG TABLET
ORAL_TABLET | Freq: Every day | ORAL | 3 refills | 90 days | Status: CN
Start: 2021-01-29 — End: ?

## 2021-02-15 MED FILL — CINACALCET 30 MG TABLET: ORAL | 30 days supply | Qty: 30 | Fill #8

## 2021-02-15 MED FILL — SEVELAMER CARBONATE 800 MG TABLET: ORAL | 30 days supply | Qty: 360 | Fill #5

## 2021-02-15 MED FILL — GABAPENTIN 300 MG CAPSULE: ORAL | 30 days supply | Qty: 30 | Fill #2

## 2021-02-15 MED FILL — CITALOPRAM 20 MG TABLET: ORAL | 30 days supply | Qty: 30 | Fill #2

## 2021-02-19 ENCOUNTER — Inpatient Hospital Stay: Payer: Self-pay

## 2021-02-19 ENCOUNTER — Inpatient Hospital Stay
Admission: EM | Admit: 2021-02-19 | Discharge: 2021-02-21 | DRG: 640 | Disposition: A | Discharge status: Home / Self Care | Payer: Self-pay | Attending: Obstetrics and Gynecology | Admitting: Obstetrics and Gynecology

## 2021-02-19 ENCOUNTER — Encounter: Payer: Self-pay | Admitting: Emergency Medicine

## 2021-02-19 ENCOUNTER — Emergency Department: Payer: Self-pay

## 2021-02-19 ENCOUNTER — Other Ambulatory Visit: Payer: Self-pay

## 2021-02-19 DIAGNOSIS — K219 Gastro-esophageal reflux disease without esophagitis: Secondary | ICD-10-CM | POA: Diagnosis present

## 2021-02-19 DIAGNOSIS — D631 Anemia in chronic kidney disease: Secondary | ICD-10-CM | POA: Diagnosis present

## 2021-02-19 DIAGNOSIS — R42 Dizziness and giddiness: Secondary | ICD-10-CM

## 2021-02-19 DIAGNOSIS — E1122 Type 2 diabetes mellitus with diabetic chronic kidney disease: Secondary | ICD-10-CM | POA: Diagnosis present

## 2021-02-19 DIAGNOSIS — Z20822 Contact with and (suspected) exposure to covid-19: Secondary | ICD-10-CM | POA: Diagnosis present

## 2021-02-19 DIAGNOSIS — R296 Repeated falls: Secondary | ICD-10-CM | POA: Diagnosis present

## 2021-02-19 DIAGNOSIS — Z79899 Other long term (current) drug therapy: Secondary | ICD-10-CM

## 2021-02-19 DIAGNOSIS — R197 Diarrhea, unspecified: Secondary | ICD-10-CM | POA: Insufficient documentation

## 2021-02-19 DIAGNOSIS — Z7982 Long term (current) use of aspirin: Secondary | ICD-10-CM

## 2021-02-19 DIAGNOSIS — R109 Unspecified abdominal pain: Secondary | ICD-10-CM | POA: Diagnosis present

## 2021-02-19 DIAGNOSIS — I132 Hypertensive heart and chronic kidney disease with heart failure and with stage 5 chronic kidney disease, or end stage renal disease: Secondary | ICD-10-CM | POA: Diagnosis present

## 2021-02-19 DIAGNOSIS — N2581 Secondary hyperparathyroidism of renal origin: Secondary | ICD-10-CM | POA: Diagnosis present

## 2021-02-19 DIAGNOSIS — R7989 Other specified abnormal findings of blood chemistry: Secondary | ICD-10-CM | POA: Diagnosis present

## 2021-02-19 DIAGNOSIS — Z992 Dependence on renal dialysis: Secondary | ICD-10-CM

## 2021-02-19 DIAGNOSIS — Z833 Family history of diabetes mellitus: Secondary | ICD-10-CM

## 2021-02-19 DIAGNOSIS — I10 Essential (primary) hypertension: Secondary | ICD-10-CM | POA: Diagnosis present

## 2021-02-19 DIAGNOSIS — Z841 Family history of disorders of kidney and ureter: Secondary | ICD-10-CM

## 2021-02-19 DIAGNOSIS — R778 Other specified abnormalities of plasma proteins: Secondary | ICD-10-CM | POA: Diagnosis present

## 2021-02-19 DIAGNOSIS — F32A Depression, unspecified: Secondary | ICD-10-CM | POA: Diagnosis present

## 2021-02-19 DIAGNOSIS — N186 End stage renal disease: Secondary | ICD-10-CM

## 2021-02-19 DIAGNOSIS — Y92009 Unspecified place in unspecified non-institutional (private) residence as the place of occurrence of the external cause: Secondary | ICD-10-CM

## 2021-02-19 DIAGNOSIS — K8020 Calculus of gallbladder without cholecystitis without obstruction: Secondary | ICD-10-CM

## 2021-02-19 DIAGNOSIS — E875 Hyperkalemia: Principal | ICD-10-CM | POA: Diagnosis present

## 2021-02-19 DIAGNOSIS — Z9115 Patient's noncompliance with renal dialysis: Secondary | ICD-10-CM

## 2021-02-19 DIAGNOSIS — W19XXXA Unspecified fall, initial encounter: Secondary | ICD-10-CM | POA: Diagnosis present

## 2021-02-19 DIAGNOSIS — R112 Nausea with vomiting, unspecified: Secondary | ICD-10-CM | POA: Insufficient documentation

## 2021-02-19 DIAGNOSIS — M109 Gout, unspecified: Secondary | ICD-10-CM | POA: Diagnosis present

## 2021-02-19 DIAGNOSIS — Z794 Long term (current) use of insulin: Secondary | ICD-10-CM

## 2021-02-19 DIAGNOSIS — E1129 Type 2 diabetes mellitus with other diabetic kidney complication: Secondary | ICD-10-CM | POA: Diagnosis present

## 2021-02-19 DIAGNOSIS — I5022 Chronic systolic (congestive) heart failure: Secondary | ICD-10-CM | POA: Diagnosis present

## 2021-02-19 LAB — BASIC METABOLIC PANEL
Anion gap: 18 — ABNORMAL HIGH (ref 5–15)
BUN: 91 mg/dL — ABNORMAL HIGH (ref 8–23)
CO2: 24 mmol/L (ref 22–32)
Calcium: 7.3 mg/dL — ABNORMAL LOW (ref 8.9–10.3)
Chloride: 91 mmol/L — ABNORMAL LOW (ref 98–111)
Creatinine, Ser: 8.63 mg/dL — ABNORMAL HIGH (ref 0.61–1.24)
GFR, Estimated: 6 mL/min — ABNORMAL LOW (ref >60)
Glucose, Bld: 101 mg/dL — ABNORMAL HIGH (ref 70–99)
Potassium: 7.3 mmol/L (ref 3.5–5.1)
Sodium: 133 mmol/L — ABNORMAL LOW (ref 135–145)

## 2021-02-19 LAB — PROTIME-INR
INR: 1.2 (ref 0.8–1.2)
Prothrombin Time: 15.4 seconds — ABNORMAL HIGH (ref 11.4–15.2)

## 2021-02-19 LAB — GLUCOSE, CAPILLARY
Glucose-Capillary: 131 mg/dL — ABNORMAL HIGH (ref 70–99)
Glucose-Capillary: 60 mg/dL — ABNORMAL LOW (ref 70–99)
Glucose-Capillary: 89 mg/dL (ref 70–99)
Glucose-Capillary: 87 mg/dL (ref 70–99)
Glucose-Capillary: 91 mg/dL (ref 70–99)
Glucose-Capillary: 80 mg/dL (ref 70–99)
Glucose-Capillary: 67 mg/dL — ABNORMAL LOW (ref 70–99)
Glucose-Capillary: 92 mg/dL (ref 70–99)
Glucose-Capillary: 219 mg/dL — ABNORMAL HIGH (ref 70–99)

## 2021-02-19 LAB — HEPATIC FUNCTION PANEL
ALT: 13 U/L (ref 0–44)
AST: 16 U/L (ref 15–41)
Albumin: 3.9 g/dL (ref 3.5–5.0)
Alkaline Phosphatase: 118 U/L (ref 38–126)
Bilirubin, Direct: 0.2 mg/dL (ref 0.0–0.2)
Indirect Bilirubin: 0.7 mg/dL (ref 0.3–0.9)
Total Bilirubin: 0.9 mg/dL (ref 0.3–1.2)
Total Protein: 7.7 g/dL (ref 6.5–8.1)

## 2021-02-19 LAB — RESP PANEL BY RT-PCR (FLU A&B, COVID) ARPGX2
Influenza A by PCR: NEGATIVE
Influenza B by PCR: NEGATIVE
SARS Coronavirus 2 by RT PCR: NEGATIVE

## 2021-02-19 LAB — TROPONIN I (HIGH SENSITIVITY)
Troponin I (High Sensitivity): 29 ng/L — ABNORMAL HIGH (ref <18)
Troponin I (High Sensitivity): 29 ng/L — ABNORMAL HIGH (ref <18)
Troponin I (High Sensitivity): 30 ng/L — ABNORMAL HIGH (ref <18)
Troponin I (High Sensitivity): 29 ng/L — ABNORMAL HIGH (ref <18)

## 2021-02-19 LAB — CBC
HCT: 33 % — ABNORMAL LOW (ref 39.0–52.0)
Hemoglobin: 11 g/dL — ABNORMAL LOW (ref 13.0–17.0)
MCH: 32.4 pg (ref 26.0–34.0)
MCHC: 33.3 g/dL (ref 30.0–36.0)
MCV: 97.3 fL (ref 80.0–100.0)
Platelets: 140 10*3/uL — ABNORMAL LOW (ref 150–400)
RBC: 3.39 MIL/uL — ABNORMAL LOW (ref 4.22–5.81)
RDW: 14.6 % (ref 11.5–15.5)
WBC: 6.8 10*3/uL (ref 4.0–10.5)
nRBC: 0 % (ref 0.0–0.2)

## 2021-02-19 LAB — LIPASE, BLOOD: Lipase: 43 U/L (ref 11–51)

## 2021-02-19 LAB — CBG MONITORING, ED: Glucose-Capillary: 138 mg/dL — ABNORMAL HIGH (ref 70–99)

## 2021-02-19 MED ORDER — DEXTROSE 50 % IV SOLN: 1.0000 | INTRAVENOUS | Status: DC

## 2021-02-19 MED ORDER — SODIUM CHLORIDE 0.9 % IV SOLN: 100.0000 mL | INTRAVENOUS | Status: DC | PRN

## 2021-02-19 MED ORDER — DEXTROSE 10 % IV SOLN
250.0000 mL | Freq: Once | INTRAVENOUS | Status: AC
  Administered 2021-02-19: 250 mL via INTRAVENOUS

## 2021-02-19 MED ORDER — SODIUM BICARBONATE 8.4 % IV SOLN
50.0000 meq | Freq: Once | INTRAVENOUS | Status: AC
  Administered 2021-02-19: 50 meq via INTRAVENOUS
  Filled 2021-02-19: qty 50

## 2021-02-19 MED ORDER — MORPHINE SULFATE (PF) 2 MG/ML IV SOLN: 2.0000 mg | INTRAVENOUS | Status: DC | PRN

## 2021-02-19 MED ORDER — HYDRALAZINE HCL 20 MG/ML IJ SOLN: 5.0000 mg | INTRAMUSCULAR | Status: DC | PRN

## 2021-02-19 MED ORDER — CALCIUM GLUCONATE-NACL 1-0.675 GM/50ML-% IV SOLN
1.0000 g | Freq: Once | INTRAVENOUS | Status: AC
  Administered 2021-02-19: 1000 mg via INTRAVENOUS
  Filled 2021-02-19: qty 50

## 2021-02-19 MED ORDER — PENTAFLUOROPROP-TETRAFLUOROETH EX AERO
1.0000 "application " | INHALATION_SPRAY | CUTANEOUS | Status: DC | PRN
  Filled 2021-02-19: qty 30

## 2021-02-19 MED ORDER — CHLORHEXIDINE GLUCONATE CLOTH 2 % EX PADS
6.0000 | MEDICATED_PAD | Freq: Every day | CUTANEOUS | Status: DC
  Administered 2021-02-20 – 2021-02-21 (×2): 6 via TOPICAL
  Filled 2021-02-19 (×2): qty 6

## 2021-02-19 MED ORDER — LIDOCAINE HCL (PF) 1 % IJ SOLN
5.0000 mL | INTRAMUSCULAR | Status: DC | PRN
  Filled 2021-02-19: qty 5

## 2021-02-19 MED ORDER — DEXTROSE 50 % IV SOLN
1.0000 | Freq: Once | INTRAVENOUS | Status: AC
  Administered 2021-02-19: 50 mL via INTRAVENOUS
  Filled 2021-02-19: qty 50

## 2021-02-19 MED ORDER — ACETAMINOPHEN 325 MG PO TABS: 650.0000 mg | ORAL_TABLET | Freq: Four times a day (QID) | ORAL | Status: DC | PRN

## 2021-02-19 MED ORDER — HYDROXYZINE HCL 50 MG/ML IM SOLN
25.0000 mg | Freq: Four times a day (QID) | INTRAMUSCULAR | Status: DC | PRN
Start: 2021-02-19 — End: 2021-02-21
  Filled 2021-02-19: qty 0.5

## 2021-02-19 MED ORDER — INSULIN ASPART 100 UNIT/ML IJ SOLN
0.0000 [IU] | Freq: Every day | INTRAMUSCULAR | Status: DC
  Administered 2021-02-19: 2 [IU] via SUBCUTANEOUS
  Filled 2021-02-19: qty 1

## 2021-02-19 MED ORDER — ALTEPLASE 2 MG IJ SOLR
2.0000 mg | Freq: Once | INTRAMUSCULAR | Status: DC | PRN
  Filled 2021-02-19: qty 2

## 2021-02-19 MED ORDER — INSULIN ASPART 100 UNIT/ML IJ SOLN
0.0000 [IU] | Freq: Three times a day (TID) | INTRAMUSCULAR | Status: DC
  Administered 2021-02-20 – 2021-02-21 (×2): 2 [IU] via SUBCUTANEOUS
  Filled 2021-02-19 (×2): qty 1

## 2021-02-19 MED ORDER — HEPARIN SODIUM (PORCINE) 1000 UNIT/ML DIALYSIS
1000.0000 [IU] | INTRAMUSCULAR | Status: DC | PRN
  Filled 2021-02-19: qty 1

## 2021-02-19 MED ORDER — INSULIN ASPART 100 UNIT/ML IJ SOLN
8.0000 [IU] | Freq: Once | INTRAMUSCULAR | Status: AC
  Administered 2021-02-19: 8 [IU] via INTRAVENOUS
  Filled 2021-02-19: qty 1

## 2021-02-19 MED ORDER — LIDOCAINE-PRILOCAINE 2.5-2.5 % EX CREA
1.0000 "application " | TOPICAL_CREAM | CUTANEOUS | Status: DC | PRN
  Filled 2021-02-19: qty 5

## 2021-02-19 NOTE — Plan of Care (Signed)

## 2021-02-19 NOTE — Progress Notes (Signed)
Blood glucose 67 at time of admission, recheck 92 after one soda. Patient asymptomatic

## 2021-02-19 NOTE — Progress Notes (Signed)
[facility]  ROUNDING NOTE   Subjective:   Eunice Blase. [name] is a 65 year old male with a past medical history of hypertension, diabetes, gout, and end-stage renal disease requiring dialysis.  He presents to the emergency room with complaints of dizziness and weakness.  He reportedly missed his dialysis treatment yesterday due to not feeling well.  Patient is seen resting on stretcher, with wife at bedside.  States his weakness and dizziness began over the weekend.  Wife states that this has been a progressive thing over the past few weeks. Reports no change in appetite. Denies nausea and vomiting.  States his bowels have currently but denies diarrhea.  States he did become short of breath on Sunday.  Wife states he has been unable to lay flat and has to sleep sitting up for the past few months.  Denies seeing a physician for this concern. He also reports some chest pain while he was short of breath.  Currently he does not urinate due to dialysis, so no home diuretics prescribed.  He last received dialysis on Friday, states it went well.  Says access in his left upper arm he usually tender and tends to bleed long after dialysis.  Objective:  Vital signs in last 24 hours:  Temp:  [98 F (36.7 C)] 98 F (36.7 C) (05/24 0840) Pulse Rate:  [68-75] 68 (05/24 1130) Resp:  [15-24] 16 (05/24 1130) BP: (131-142)/(71-78) 131/74 (05/24 1130) SpO2:  [94 %-97 %] 94 % (05/24 1130) Weight:  [96 kg] 96 kg (05/24 0837)  Weight change:  Filed Weights   02/19/21 0837  Weight: 96 kg    Intake/Output: No intake/output data recorded.   Intake/Output this shift:  No intake/output data recorded.  Physical Exam: General: NAD, resting on stretcher  Head: Normocephalic, atraumatic. Moist oral mucosal membranes  Eyes: Anicteric  Lungs:  Clear to auscultation, normal breathing effort  Heart: Regular rate and rhythm  Abdomen:  Soft, nontender,   Extremities:  no peripheral edema.  Neurologic:  Nonfocal, moving all four extremities  Skin: No lesions  Access: Lt upper AVF    Basic Metabolic Panel: Recent Labs  Lab 02/19/21 0850  NA 133*  K 7.3*  CL 91*  CO2 24  GLUCOSE 101*  BUN 91*  CREATININE 8.63*  CALCIUM 7.3*    Liver Function Tests: Recent Labs  Lab 02/19/21 0850  AST 16  ALT 13  ALKPHOS 118  BILITOT 0.9  PROT 7.7  ALBUMIN 3.9   Recent Labs  Lab 02/19/21 0850  LIPASE 43   No results for input(s): AMMONIA in the last 168 hours.  CBC: Recent Labs  Lab 02/19/21 0850  WBC 6.8  HGB 11.0*  HCT 33.0*  MCV 97.3  PLT 140*    Cardiac Enzymes: No results for input(s): CKTOTAL, CKMB, CKMBINDEX, TROPONINI in the last 168 hours.  BNP: Invalid input(s): POCBNP  CBG: Recent Labs  Lab 02/19/21 1150  GLUCAP 138*    Microbiology: Results for orders placed or performed during the hospital encounter of 11/23/19  GI pathogen panel by PCR, stool     Status: None   Collection Time: 11/23/19 10:39 AM   Specimen: Stool  Result Value Ref Range Status   Plesiomonas shigelloides NOT DETECTED NOT DETECTED Final   Yersinia enterocolitica NOT DETECTED NOT DETECTED Final   Vibrio NOT DETECTED NOT DETECTED Final   Enteropathogenic E coli NOT DETECTED NOT DETECTED Final   E coli (ETEC) LT/ST NOT DETECTED NOT DETECTED Final  E coli [id number] by PCR Not applicable NOT DETECTED Final   Cryptosporidium by PCR NOT DETECTED NOT DETECTED Final   Entamoeba histolytica NOT DETECTED NOT DETECTED Final   Adenovirus F 40/41 NOT DETECTED NOT DETECTED Final   Norovirus GI/GII NOT DETECTED NOT DETECTED Final   Sapovirus NOT DETECTED NOT DETECTED Final    Comment: (NOTE) Performed At: [facility] [address] [place], [place] [id number] Rush Farmer MD [id number]    Vibrio cholerae NOT DETECTED NOT DETECTED Final   Campylobacter by PCR NOT DETECTED NOT DETECTED Final   Salmonella by PCR NOT DETECTED NOT DETECTED Final   E coli (STEC) NOT DETECTED NOT  DETECTED Final   Enteroaggregative E coli NOT DETECTED NOT DETECTED Final   Shigella by PCR NOT DETECTED NOT DETECTED Final   Cyclospora cayetanensis NOT DETECTED NOT DETECTED Final   Astrovirus NOT DETECTED NOT DETECTED Final   G lamblia by PCR NOT DETECTED NOT DETECTED Final   Rotavirus A by PCR NOT DETECTED NOT DETECTED Final    Coagulation Studies: Recent Labs    02/19/21 0850  LABPROT 15.4*  INR 1.2    Urinalysis: No results for input(s): COLORURINE, LABSPEC, PHURINE, GLUCOSEU, HGBUR, BILIRUBINUR, KETONESUR, PROTEINUR, UROBILINOGEN, NITRITE, LEUKOCYTESUR in the last 72 hours.  Invalid input(s): APPERANCEUR    Imaging: CT ABDOMEN PELVIS WO CONTRAST  Result Date: 02/19/2021 CLINICAL DATA:  Left flank bruising after multiple falls. EXAM: CT ABDOMEN AND PELVIS WITHOUT CONTRAST TECHNIQUE: Multidetector CT imaging of the abdomen and pelvis was performed following the standard protocol without IV contrast. COMPARISON:  July 27, 2018. FINDINGS: Lower chest: No acute abnormality. Hepatobiliary: Minimal cholelithiasis is noted. Probable mild gallbladder wall thickening is noted with mild inflammatory changes suggesting possible cholecystitis. No biliary dilatation is noted. Small amount of fluid is noted around the liver. The liver is otherwise unremarkable. Pancreas: Unremarkable. No pancreatic ductal dilatation or surrounding inflammatory changes. Spleen: Normal in size without focal abnormality. Adrenals/Urinary Tract: Adrenal glands appear normal. Bilateral renal atrophy is noted consistent with history of end-stage renal disease. No hydronephrosis or renal obstruction is noted. Urinary bladder is decompressed. Stomach/Bowel: Stomach is within normal limits. Appendix appears normal. No evidence of bowel wall thickening, distention, or inflammatory changes. Vascular/Lymphatic: Aortic atherosclerosis. No enlarged abdominal or pelvic lymph nodes. Reproductive: Prostate is unremarkable.  Other: No hernia is noted. Minimal to mild ascites is noted in the pelvis as well as around the liver and spleen. Musculoskeletal: No acute or significant osseous findings. IMPRESSION: Minimal cholelithiasis. Probable mild gallbladder wall thickening is noted with mild surrounding inflammatory changes suggesting possible cholecystitis. Ultrasound is recommended for further evaluation. Minimal to mild ascites is noted. Aortic Atherosclerosis (ICD10-I70.0). Electronically Signed   By: Marijo Conception M.D.   On: 02/19/2021 10:21   DG Chest Port 1 View  Result Date: 02/19/2021 CLINICAL DATA:  Dizziness.  Falls.  Dialysis patient. EXAM: PORTABLE CHEST 1 VIEW COMPARISON:  Chest x-ray 11/23/2019. FINDINGS: Cardiomegaly with pulmonary venous congestion and bilateral interstitial prominence consistent CHF. Low lung volumes. No pleural effusion or pneumothorax. IMPRESSION: Cardiomegaly with pulmonary venous congestion and bilateral interstitial prominence consistent with CHF. Low lung volumes. Electronically Signed   By: Marcello Moores  Register   On: 02/19/2021 09:41     Medications:   . calcium gluconate 1,000 mg (02/19/21 1143)   . Chlorhexidine Gluconate Cloth  6 each Topical Q0600  . insulin aspart  0-5 Units Subcutaneous QHS  . insulin aspart  0-9 Units Subcutaneous TID WC  acetaminophen, hydrALAZINE, hydrOXYzine  Assessment/ Plan:  Mr. Callie Quiros is a 65 y.o.  male with a past medical history of hypertension, diabetes, gout, and end-stage renal disease requiring dialysis.  He presents to the emergency room with complaints of dizziness and weakness.  He reportedly missed his dialysis treatment yesterday due to not feeling well.  MWF [facility] Left AVF  1. End Stage Renal Disease with hyperkalemia on hemodialysis: on MWF Last hemodialysis 02/15/21. Potassium on arrive 7.3. Will perform emergent HD using 1K bath for 1 hour then 2K for remainder UF 1L as tolerated Will monitor  access for concerns with bleeding after treatment  2. Anemia of chronic kidney disease  Lab Results  Component Value Date   HGB 11.0 (L) 02/19/2021   Hgb above desired goal No EPO required at this time  3. Secondary Hyperparathyroidism:   Lab Results  Component Value Date   PTH 359 (H) 08/01/2019   CALCIUM 7.3 (L) 02/19/2021   PHOS 9.1 (H) 08/03/2019   Will obtain updated phosphorus level with dialysis Decreased calcium levels  Patient currently binders Calcium acetate and sevelamer with meals  4 Diabetes mellitus type II with chronic kidney disease  insulin dependent. Home regimen includes Novolog 70/30.  Glucose stable at this time Primary team will manage SSI     LOS: 0 Maninder Deboer 5/24/202211:53 AM

## 2021-02-19 NOTE — H&P (Signed)
History and Physical    Danny [name] DOB: [birth year] DOA: 02/19/2021  Referring MD/NP/PA:   PCP: Patient, No Pcp Per (Inactive)   Patient coming from:  The patient is coming from home.  At baseline, pt is independent for most of ADL.        Chief Complaint: fall  HPI: Danny [name] is a 65 y.o. male with medical history significant of ESRD-HD (MWF), hypertension, diabetes mellitus, GERD, gout, depression, CHF with EF 35-40%, rectal bleeding, who presents with fall.  Patient states that she had dizziness and fell yesterday.  Denies loss of consciousness.  Denies significant injury.  Denies headache or neck pain.  Patient has some bruise to LLQ. Patient states that he had chest pain 2 days ago, which has resolved.  Patient has mild cough and mild shortness of breath currently, denies fever or chills.  Patient also had nausea and mild abdominal pain yesterday, which has resolved.  Currently patient does not have nausea, vomiting, diarrhea or abdominal pain.  No symptoms of UTI.  Patient denies unilateral numbness or tingling in extremities.  No facial droop or slurred speech.  Patient missed dialysis yesterday due to not feeling well.  ED Course: pt was found to have potassium of 7.3, WBC 6.8, INR 1.2, troponin level 29, negative COVID PCR, bicarbonate 24, creatinine 8.63, BUN 91, liver function normal, temperature normal, blood pressure 133/76, heart rate 69, RR 24, oxygen saturation 95% on room air.  Chest x-ray showed cardiomegaly, vascular congestion and mild interstitial edema.  Patient is admitted to progressive bed as inpatient.  Dr. Holley Raring of nephrology is consulted for urgent dialysis.  CT abdomen/pelvis: Minimal cholelithiasis. Probable mild gallbladder wall thickening is noted with mild surrounding inflammatory changes suggesting possible cholecystitis. Ultrasound is recommended for further evaluation.  Minimal to mild ascites is noted.   Review of Systems:    General: no fevers, chills, no body weight gain, has poor appetite, has fatigue HEENT: no blurry vision, hearing changes or sore throat Respiratory: has dyspnea, coughing, no wheezing CV: had chest pain, no palpitations GI: had nausea, abdominal pain, no diarrhea, vomiting, constipation GU: no dysuria, burning on urination, increased urinary frequency, hematuria  Ext: has mild leg edema Neuro: no unilateral weakness, numbness, or tingling, no vision change or hearing loss Skin: no rash, no skin tear. MSK: No muscle spasm, no deformity, no limitation of range of movement in spin Heme: No easy bruising.  Travel history: No recent long distant travel.  Allergy: No Known Allergies  Past Medical History:  Diagnosis Date  . Diabetes mellitus without complication ([place])   . Dialysis patient [facility])    tuesday, thursday and saturday  . Gout   . Hypertension   . Kidney disease     Past Surgical History:  Procedure Laterality Date  . AV FISTULA PLACEMENT    . miscellaneous     peritoneal dialysis catheter placement and removal    Social History:  reports that he has never smoked. He has never used smokeless tobacco. He reports that he does not drink alcohol and does not use drugs.  Family History:  Family History  Problem Relation Age of Onset  . Diabetes Mother   . Prostate cancer Father   . Kidney disease Sister      Prior to Admission medications   Medication Sig Start Date End Date Taking? Authorizing Provider  albuterol (VENTOLIN HFA) 108 (90 Base) MCG/ACT inhaler Inhale 2 puffs into the lungs every 6 (six)  hours as needed for wheezing. 07/19/18   [provider]  amLODipine (NORVASC) 5 MG tablet Take 5 mg by mouth at bedtime. 11/05/18   [provider]  amoxicillin-clavulanate (AUGMENTIN) 875-125 MG tablet Take 1 tablet by mouth 2 (two) times daily. 02/15/20   Triplett, Johnette Abraham B, FNP  calcium acetate (PHOSLO) 667 MG capsule Take 2,001 mg by mouth 3 (three)  times daily with meals.     [provider]  carvedilol (COREG) 6.25 MG tablet Take 6.25 mg by mouth 2 (two) times daily with a meal.    [provider]  citalopram (CELEXA) 20 MG tablet Take 20 mg by mouth daily.     [provider]  gabapentin (NEURONTIN) 300 MG capsule Take 300 mg by mouth daily. 04/08/19   [provider]  insulin aspart protamine- aspart (NOVOLOG MIX 70/30) (70-30) 100 UNIT/ML injection Inject 5-10 Units into the skin See admin instructions. Inject 10u under the skin every morning and 5u under the skin at night    [provider]  loperamide (IMODIUM) 2 MG capsule Take 1 capsule (2 mg total) by mouth every 6 (six) hours as needed for diarrhea or loose stools. 07/29/18   Henreitta Leber, MD  multivitamin (RENA-VIT) TABS tablet Take 1 tablet by mouth at bedtime. 08/04/19   Swayze, Ava, DO  Nutritional Supplements (FEEDING SUPPLEMENT, NEPRO CARB STEADY,) LIQD Take 237 mLs by mouth 2 (two) times daily between meals. 08/05/19   Swayze, Ava, DO  omeprazole (PRILOSEC OTC) 20 MG tablet Take 20 mg by mouth daily.    [provider]  sacubitril-valsartan (ENTRESTO) 24-26 MG Take 1 tablet by mouth 2 (two) times daily. Patient not taking: Reported on 11/23/2019 05/29/19   Bettey Costa, MD  sevelamer carbonate (RENVELA) 800 MG tablet Take 800-3,200 mg by mouth See admin instructions. Take 4 tablets ('3200mg'$ ) by mouth three times daily with meals and take 1 tablet ('800mg'$ ) by mouth twice daily with snacks    [provider]    Physical Exam: Vitals:   02/19/21 1545 02/19/21 1600 02/19/21 1630 02/19/21 1720  BP: (!) 156/77 (!) 156/87 (!) 151/74 (!) 170/80  Pulse: 77 82 81 80  Resp: '14 17 15 17  '$ Temp:    (!) 97.4 F (36.3 C)  TempSrc:    Oral  SpO2:   99% 100%  Weight:    94.1 kg  Height:    '5\' 8"'$  (1.727 m)   General: Not in acute distress HEENT:       Eyes: PERRL, EOMI, no scleral icterus.       ENT: No discharge from the  ears and nose, no pharynx injection, no tonsillar enlargement.        Neck: No JVD, no bruit, no mass felt. Heme: No neck lymph node enlargement. Cardiac: S1/S2, RRR, No murmurs, No gallops or rubs. Respiratory: No rales, wheezing, rhonchi or rubs. GI: Soft, nondistended, nontender, no rebound pain, no organomegaly, BS present. GU: No hematuria Ext: has trace leg edema bilaterally. 1+DP/PT pulse bilaterally. Musculoskeletal: No joint deformities, No joint redness or warmth, no limitation of ROM in spin. Skin: No rashes.  Neuro: Alert, oriented X3, cranial nerves II-XII grossly intact, moves all extremities normally.  Psych: Patient is not psychotic, no suicidal or hemocidal ideation.  Labs on Admission: I have personally reviewed following labs and imaging studies  CBC: Recent Labs  Lab 02/19/21 0850  WBC 6.8  HGB 11.0*  HCT 33.0*  MCV 97.3  PLT 140*  Basic Metabolic Panel: Recent Labs  Lab 02/19/21 0850  NA 133*  K 7.3*  CL 91*  CO2 24  GLUCOSE 101*  BUN 91*  CREATININE 8.63*  CALCIUM 7.3*   GFR: Estimated Creatinine Clearance: 9.6 mL/min (A) (by C-G formula based on SCr of 8.63 mg/dL (H)). Liver Function Tests: Recent Labs  Lab 02/19/21 0850  AST 16  ALT 13  ALKPHOS 118  BILITOT 0.9  PROT 7.7  ALBUMIN 3.9   Recent Labs  Lab 02/19/21 0850  LIPASE 43   No results for input(s): AMMONIA in the last 168 hours. Coagulation Profile: Recent Labs  Lab 02/19/21 0850  INR 1.2   Cardiac Enzymes: No results for input(s): CKTOTAL, CKMB, CKMBINDEX, TROPONINI in the last 168 hours. BNP (last 3 results) No results for input(s): PROBNP in the last 8760 hours. HbA1C: No results for input(s): HGBA1C in the last 72 hours. CBG: Recent Labs  Lab 02/19/21 1150 02/19/21 1728 02/19/21 1756  GLUCAP 138* 67* 92   Lipid Profile: No results for input(s): CHOL, HDL, LDLCALC, TRIG, CHOLHDL, LDLDIRECT in the last 72 hours. Thyroid Function Tests: No results for  input(s): TSH, T4TOTAL, FREET4, T3FREE, THYROIDAB in the last 72 hours. Anemia Panel: No results for input(s): VITAMINB12, FOLATE, FERRITIN, TIBC, IRON, RETICCTPCT in the last 72 hours. Urine analysis:    Component Value Date/Time   COLORURINE YELLOW (A) 09/22/2017 1006   APPEARANCEUR CLEAR (A) 09/22/2017 1006   APPEARANCEUR Clear 09/10/2013 2254   LABSPEC 1.008 09/22/2017 1006   LABSPEC 1.003 09/10/2013 2254   PHURINE 9.0 (H) 09/22/2017 1006   GLUCOSEU 150 (A) 09/22/2017 1006   GLUCOSEU >=500 09/10/2013 2254   HGBUR NEGATIVE 09/22/2017 1006   BILIRUBINUR NEGATIVE 09/22/2017 1006   BILIRUBINUR Negative 09/10/2013 [place] 09/22/2017 1006   PROTEINUR 100 (A) 09/22/2017 1006   NITRITE NEGATIVE 09/22/2017 1006   LEUKOCYTESUR NEGATIVE 09/22/2017 1006   LEUKOCYTESUR Negative 09/10/2013 2254   Sepsis Labs: '@LABRCNTIP'$ (procalcitonin:4,lacticidven:4) ) Recent Results (from the past 240 hour(s))  Resp Panel by RT-PCR (Flu A&B, Covid) Nasopharyngeal Swab     Status: None   Collection Time: 02/19/21 11:44 AM   Specimen: Nasopharyngeal Swab; Nasopharyngeal(NP) swabs in vial transport medium  Result Value Ref Range Status   SARS Coronavirus 2 by RT PCR NEGATIVE NEGATIVE Final    Comment: (NOTE) SARS-CoV-2 target nucleic acids are NOT DETECTED.  The SARS-CoV-2 RNA is generally detectable in upper respiratory specimens during the acute phase of infection. The lowest concentration of SARS-CoV-2 viral copies this assay can detect is 138 copies/mL. A negative result does not preclude SARS-Cov-2 infection and should not be used as the sole basis for treatment or other patient management decisions. A negative result may occur with  improper specimen collection/handling, submission of specimen other than nasopharyngeal swab, presence of viral mutation(s) within the areas targeted by this assay, and inadequate number of viral copies(<138 copies/mL). A negative result must be  combined with clinical observations, patient history, and epidemiological information. The expected result is Negative.  Fact Sheet for Patients:  EntrepreneurPulse.com.au  Fact Sheet for Healthcare Providers:  IncredibleEmployment.be  This test is no t yet approved or cleared by the [place] FDA and  has been authorized for detection and/or diagnosis of SARS-CoV-2 by FDA under an Emergency Use Authorization (EUA). This EUA will remain  in effect (meaning this test can be used) for the duration of the COVID-19 declaration under Section 564(b)(1) of the Act, 21 U.S.C.section 360bbb-3(b)(1), unless the  authorization is terminated  or revoked sooner.       Influenza A by PCR NEGATIVE NEGATIVE Final   Influenza B by PCR NEGATIVE NEGATIVE Final    Comment: (NOTE) The Xpert Xpress SARS-CoV-2/FLU/RSV plus assay is intended as an aid in the diagnosis of influenza from Nasopharyngeal swab specimens and should not be used as a sole basis for treatment. Nasal washings and aspirates are unacceptable for Xpert Xpress SARS-CoV-2/FLU/RSV testing.  Fact Sheet for Patients: EntrepreneurPulse.com.au  Fact Sheet for Healthcare Providers: IncredibleEmployment.be  This test is not yet approved or cleared by the [place] FDA and has been authorized for detection and/or diagnosis of SARS-CoV-2 by FDA under an Emergency Use Authorization (EUA). This EUA will remain in effect (meaning this test can be used) for the duration of the COVID-19 declaration under Section 564(b)(1) of the Act, 21 U.S.C. section 360bbb-3(b)(1), unless the authorization is terminated or revoked.  Performed at [facility], [address] [place], [place] [address]      Radiological Exams on Admission: CT ABDOMEN PELVIS WO CONTRAST  Result Date: 02/19/2021 CLINICAL DATA:  Left flank bruising after multiple falls. EXAM: CT  ABDOMEN AND PELVIS WITHOUT CONTRAST TECHNIQUE: Multidetector CT imaging of the abdomen and pelvis was performed following the standard protocol without IV contrast. COMPARISON:  July 27, 2018. FINDINGS: Lower chest: No acute abnormality. Hepatobiliary: Minimal cholelithiasis is noted. Probable mild gallbladder wall thickening is noted with mild inflammatory changes suggesting possible cholecystitis. No biliary dilatation is noted. Small amount of fluid is noted around the liver. The liver is otherwise unremarkable. Pancreas: Unremarkable. No pancreatic ductal dilatation or surrounding inflammatory changes. Spleen: Normal in size without focal abnormality. Adrenals/Urinary Tract: Adrenal glands appear normal. Bilateral renal atrophy is noted consistent with history of end-stage renal disease. No hydronephrosis or renal obstruction is noted. Urinary bladder is decompressed. Stomach/Bowel: Stomach is within normal limits. Appendix appears normal. No evidence of bowel wall thickening, distention, or inflammatory changes. Vascular/Lymphatic: Aortic atherosclerosis. No enlarged abdominal or pelvic lymph nodes. Reproductive: Prostate is unremarkable. Other: No hernia is noted. Minimal to mild ascites is noted in the pelvis as well as around the liver and spleen. Musculoskeletal: No acute or significant osseous findings. IMPRESSION: Minimal cholelithiasis. Probable mild gallbladder wall thickening is noted with mild surrounding inflammatory changes suggesting possible cholecystitis. Ultrasound is recommended for further evaluation. Minimal to mild ascites is noted. Aortic Atherosclerosis (ICD10-I70.0). Electronically Signed   By: Marijo Conception M.D.   On: 02/19/2021 10:21   DG Chest Port 1 View  Result Date: 02/19/2021 CLINICAL DATA:  Dizziness.  Falls.  Dialysis patient. EXAM: PORTABLE CHEST 1 VIEW COMPARISON:  Chest x-ray 11/23/2019. FINDINGS: Cardiomegaly with pulmonary venous congestion and bilateral  interstitial prominence consistent CHF. Low lung volumes. No pleural effusion or pneumothorax. IMPRESSION: Cardiomegaly with pulmonary venous congestion and bilateral interstitial prominence consistent with CHF. Low lung volumes. Electronically Signed   By: Marcello Moores  Register   On: 02/19/2021 09:41     EKG: I have personally reviewed.  Sinus rhythm, QTC 508, widened QRS, left bundle blockade with seem to be old.  Poor R wave progression  Assessment/Plan Principal Problem:   Hyperkalemia Active Problems:   Hypertension   Depression   Abdominal pain   Elevated troponin   Fall at home, initial encounter   Type II diabetes mellitus with renal manifestations ([place])   ESRD (end stage renal disease) on dialysis ([place])   Chronic systolic CHF (congestive heart failure) ([facility])   Hyperkalemia and  ESRD (end stage renal disease) on dialysis (MWF): K 7.3. HR 69.  Patient missed her dialysis yesterday.  -Admitted to progressive bed as inpatient -Patient was treated with D10, NovoLog 8 unit, 50 mEq of sodium bicarbonate, 1 g of calcium gluconate -Dr. Holley Raring of renal is consulted for urgent dialysis  Hypertension -Hold Coreg since patient at risk of developing bradycardia due to hyperkalemia -IV hydralazine as needed -Amlodipine, lisinopril,  Depression -Continue home medications  Abdominal pain: Patient had mild abdominal pain which has resolved.  CT abdomen/pelvis showed some abnormalities, "minimal cholelithiasis. Probable mild gallbladder wall thickening is noted with mild surrounding inflammatory changes suggesting possible cholecystitis".  Currently patient does not have any GI symptoms.  Liver function normal.  No fever or leukocytosis.  Clinically less likely to have cholecystitis. -Observe closely  Elevated troponin: Troponin level 29, 29, most likely due to nonspecific elevation secondary to decreased clearance because of ESRD -Check A1c, FLP -Patient is on aspirin  Fall at home, initial  encounter -Follow-up CT head -PT/OT  Type II diabetes mellitus with renal manifestations [facility]): Recent A1c 6.0, well controlled.  Patient is taking 70/30 insulin at home -SSI -Decrease 70/30 insulin dose from 10-8 unit to 5-4 units twice daily  Chronic systolic CHF (congestive heart failure) ([place]): 2D echo on 05/29/2019 showed EF 35-40%.  Chest x-ray showed cardiomegaly, vascular congestion and mild interstitial edema. -Volume management per renal by dialysis    DVT ppx: SQ Heparin   Code Status: Full code Family Communication: not done, no family member is at bed side.    Disposition Plan:  Anticipate discharge back to previous environment Consults called:  none Admission status and Level of care: Progressive Cardiac:  as inpt      Status is: Inpatient  Remains inpatient appropriate because:Inpatient level of care appropriate due to severity of illness   Dispo: The patient is from: Home              Anticipated d/c is to: Home              Patient currently is not medically stable to d/c.   Difficult to place patient No          Date of Service 02/19/2021    Ivor Costa [facility]   If 7PM-7AM, please contact night-coverage [email or web address] 02/19/2021, 6:04 PM

## 2021-02-19 NOTE — Progress Notes (Signed)
Asked ed RN to check blood sugar.

## 2021-02-19 NOTE — ED Provider Notes (Signed)
[facility] Emergency Department Provider Note ____________________________________________   Event Date/Time   First MD Initiated Contact with Patient 02/19/21 [phone]     (approximate)  I have reviewed the triage vital signs and the nursing notes.  HISTORY  Chief Complaint Dizziness  HPI  Spanish interpreter utilized throughout  Danny [name] is a 65 y.o. male   history of diabetes dialysis patient  2 days ago experienced a burning sensation in his chest did not really feel like chest pain reformulate indigestion.  Then yesterday felt nauseated and fatigued.  He missed his dialysis session because of this, and today his wife reports that whenever he gets up he seems weak in the knees and is fallen several times.  He has not fallen and struck his head though he just stated falls to his knees and seems weak.  No focal weakness however.  No headache.  No chest pain  Denies abdominal pain just reports a nauseated fatigued feeling.  No vomiting.  He has had a few loose stools however in the last few days  Past Medical History:  Diagnosis Date  . Diabetes mellitus without complication ([place])   . Dialysis patient [facility])    tuesday, thursday and saturday  . Gout   . Hypertension   . Kidney disease     Patient Active Problem List   Diagnosis Date Noted  . Nausea vomiting and diarrhea 02/19/2021  . Depression 02/19/2021  . Abdominal pain 02/19/2021  . Elevated troponin 02/19/2021  . Fall at home, initial encounter 02/19/2021  . Type II diabetes mellitus with renal manifestations ([place]) 02/19/2021  . ESRD (end stage renal disease) on dialysis ([place]) 02/19/2021  . Chronic systolic CHF (congestive heart failure) ([place]) 02/19/2021  . Diabetes mellitus with ESRD (end-stage renal disease) ([place]) 08/01/2019  . Diarrhea 08/01/2019  . Hypertension   . Chronic combined systolic and diastolic CHF (congestive heart failure) ([place])   . Volume overload   . Acute diarrhea  07/27/2018  . Viral gastroenteritis 10/04/2016  . Anemia 10/04/2016  . Thrombocytopenia ([place]) 10/04/2016  . Rectal bleeding 10/04/2016  . Hyperkalemia 10/02/2016  . Pulmonary edema 05/13/2016    Past Surgical History:  Procedure Laterality Date  . AV FISTULA PLACEMENT    . miscellaneous     peritoneal dialysis catheter placement and removal    Prior to Admission medications   Medication Sig Start Date End Date Taking? Authorizing Provider  amLODipine (NORVASC) 5 MG tablet Take 5 mg by mouth at bedtime. 11/05/18  Yes [provider]  aspirin 81 MG EC tablet Take 1 tablet by mouth daily. 08/01/16  Yes [provider]  calcitRIOL (ROCALTROL) 0.25 MCG capsule Take 1 capsule by mouth daily. 12/28/15  Yes [provider]  calcium acetate (PHOSLO) 667 MG capsule Take 2,001 mg by mouth 3 (three) times daily with meals.    Yes [provider]  carvedilol (COREG) 3.125 MG tablet Take 3.125 mg by mouth 2 (two) times daily with a meal.   Yes [provider]  cinacalcet (SENSIPAR) 30 MG tablet Take 1 tablet by mouth daily. 04/10/20  Yes [provider]  furosemide (LASIX) 40 MG tablet Take 1 tablet by mouth daily. 12/28/15  Yes [provider]  gabapentin (NEURONTIN) 300 MG capsule Take 300 mg by mouth daily. 04/08/19  Yes [provider]  insulin aspart protamine- aspart (NOVOLOG MIX 70/30) (70-30) 100 UNIT/ML injection Inject 5-10 Units into the skin See admin instructions. Inject 10u under  the skin every morning and 8u under the skin at night   Yes [provider]  lisinopril (ZESTRIL) 20 MG tablet Take 1 tablet by mouth daily. 04/09/18  Yes [provider]  sevelamer carbonate (RENVELA) 800 MG tablet Take 800-3,200 mg by mouth See admin instructions. Take 4 tablets ('3200mg'$ ) by mouth three times daily with meals   Yes [provider]  traZODone (DESYREL) 50 MG tablet Take 1 tablet by mouth at bedtime.  04/13/20 02/26/21 Yes [provider]  albuterol (VENTOLIN HFA) 108 (90 Base) MCG/ACT inhaler Inhale 2 puffs into the lungs every 6 (six) hours as needed for wheezing. Patient not taking: Reported on 02/19/2021 07/19/18   [provider]  amoxicillin-clavulanate (AUGMENTIN) 875-125 MG tablet Take 1 tablet by mouth 2 (two) times daily. Patient not taking: Reported on 02/19/2021 02/15/20   Sherrie George B, FNP  carvedilol (COREG) 6.25 MG tablet Take 6.25 mg by mouth 2 (two) times daily with a meal. Patient not taking: Reported on 02/19/2021    [provider]  citalopram (CELEXA) 20 MG tablet Take 20 mg by mouth daily.  Patient not taking: Reported on 02/19/2021    [provider]  loperamide (IMODIUM) 2 MG capsule Take 1 capsule (2 mg total) by mouth every 6 (six) hours as needed for diarrhea or loose stools. Patient not taking: Reported on 02/19/2021 07/29/18   Henreitta Leber, MD  multivitamin (RENA-VIT) TABS tablet Take 1 tablet by mouth at bedtime. Patient not taking: Reported on 02/19/2021 08/04/19   Swayze, Ava, DO  Nutritional Supplements (FEEDING SUPPLEMENT, NEPRO CARB STEADY,) LIQD Take 237 mLs by mouth 2 (two) times daily between meals. 08/05/19   Swayze, Ava, DO  omeprazole (PRILOSEC OTC) 20 MG tablet Take 20 mg by mouth daily. Patient not taking: Reported on 02/19/2021    [provider]  sacubitril-valsartan (ENTRESTO) 24-26 MG Take 1 tablet by mouth 2 (two) times daily. Patient not taking: No sig reported 05/29/19   Bettey Costa, MD    Allergies Patient has no known allergies.  Family History  Problem Relation Age of Onset  . Diabetes Mother   . Prostate cancer Father   . Kidney disease Sister     Social History Social History   Tobacco Use  . Smoking status: Never Smoker  . Smokeless tobacco: Never Used  Substance Use Topics  . Alcohol use: No  . Drug use: No    Review of Systems Constitutional: No fever/chills Eyes: No visual  changes. ENT: No sore throat. Cardiovascular: Denies chest pain.  See HPI though regarding "indigestion" which she last experienced 48 hours ago Respiratory: Denies shortness of breath. Gastrointestinal: No abdominal pain.  See HPI regarding nausea Genitourinary: He makes no urine Musculoskeletal: Negative for back pain. Skin: Negative for rash. Neurological: Negative for headaches, areas of focal weakness or numbness.    ____________________________________________   PHYSICAL EXAM:  VITAL SIGNS: ED Triage Vitals  Enc Vitals Group     BP 02/19/21 0840 (!) 142/71     Pulse Rate 02/19/21 0840 75     Resp 02/19/21 0840 15     Temp 02/19/21 0840 98 F (36.7 C)     Temp Source 02/19/21 0840 Oral     SpO2 02/19/21 0840 97 %     Weight 02/19/21 0837 211 lb 10.3 oz (96 kg)     Height 02/19/21 0837 '5\' 8"'$  (1.727 m)     Head Circumference --      Peak Flow --  Pain Score 02/19/21 0837 0     Pain Loc --      Pain Edu? --      Excl. in [place]? --     Constitutional: Alert and oriented. Well appearing and in no acute distress. Eyes: Conjunctivae are normal. Head: Atraumatic. Nose: No congestion/rhinnorhea. Mouth/Throat: Mucous membranes are moist. Neck: No stridor.  Cardiovascular: Normal rate, regular rhythm. Grossly normal heart sounds.  Good peripheral circulation. Respiratory: Normal respiratory effort.  No retractions. Lungs CTAB. Gastrointestinal: Soft and nontender. No distention.  No abdominal pain noted in any area or quadrant.  Patient does have a approximately palmar sized ecchymosis over his left flank he reports this occurred with one of his falls Musculoskeletal: No lower extremity tenderness nor edema.  Left upper extremity fistula with palpable thrill. Neurologic:  Normal speech and language. No gross focal neurologic deficits are appreciated.  Skin:  Skin is warm, dry and intact. No rash noted. Psychiatric: Mood and affect are normal. Speech and behavior are  normal.  ____________________________________________   LABS (all labs ordered are listed, but only abnormal results are displayed)  Labs Reviewed  BASIC METABOLIC PANEL - Abnormal; Notable for the following components:      Result Value   Sodium 133 (*)    Potassium 7.3 (*)    Chloride 91 (*)    Glucose, Bld 101 (*)    BUN 91 (*)    Creatinine, Ser 8.63 (*)    Calcium 7.3 (*)    GFR, Estimated 6 (*)    Anion gap 18 (*)    All other components within normal limits  CBC - Abnormal; Notable for the following components:   RBC 3.39 (*)    Hemoglobin 11.0 (*)    HCT 33.0 (*)    Platelets 140 (*)    All other components within normal limits  PROTIME-INR - Abnormal; Notable for the following components:   Prothrombin Time 15.4 (*)    All other components within normal limits  CBG MONITORING, ED - Abnormal; Notable for the following components:   Glucose-Capillary 138 (*)    All other components within normal limits  TROPONIN I (HIGH SENSITIVITY) - Abnormal; Notable for the following components:   Troponin I (High Sensitivity) 29 (*)    All other components within normal limits  TROPONIN I (HIGH SENSITIVITY) - Abnormal; Notable for the following components:   Troponin I (High Sensitivity) 29 (*)    All other components within normal limits  RESP PANEL BY RT-PCR (FLU A&B, COVID) [id number]  HEPATIC FUNCTION PANEL  LIPASE, BLOOD  URINALYSIS, COMPLETE (UACMP) WITH MICROSCOPIC  CBG MONITORING, ED  CBG MONITORING, ED  TROPONIN I (HIGH SENSITIVITY)   ____________________________________________  EKG  Reviewed inter by me at 840 Heart rate 75 QRS 200 QTc 500 Sinus rhythm, left bundle branch block-like appearance though somewhat nonspecific.  He has apparent peaked T waves concerning for hyperkalemia. ____________________________________________  RADIOLOGY  CT ABDOMEN PELVIS WO CONTRAST  Result Date: 02/19/2021 CLINICAL DATA:  Left flank bruising after multiple falls. EXAM:  CT ABDOMEN AND PELVIS WITHOUT CONTRAST TECHNIQUE: Multidetector CT imaging of the abdomen and pelvis was performed following the standard protocol without IV contrast. COMPARISON:  July 27, 2018. FINDINGS: Lower chest: No acute abnormality. Hepatobiliary: Minimal cholelithiasis is noted. Probable mild gallbladder wall thickening is noted with mild inflammatory changes suggesting possible cholecystitis. No biliary dilatation is noted. Small amount of fluid is noted around the liver. The liver is otherwise unremarkable. Pancreas: Unremarkable. No pancreatic  ductal dilatation or surrounding inflammatory changes. Spleen: Normal in size without focal abnormality. Adrenals/Urinary Tract: Adrenal glands appear normal. Bilateral renal atrophy is noted consistent with history of end-stage renal disease. No hydronephrosis or renal obstruction is noted. Urinary bladder is decompressed. Stomach/Bowel: Stomach is within normal limits. Appendix appears normal. No evidence of bowel wall thickening, distention, or inflammatory changes. Vascular/Lymphatic: Aortic atherosclerosis. No enlarged abdominal or pelvic lymph nodes. Reproductive: Prostate is unremarkable. Other: No hernia is noted. Minimal to mild ascites is noted in the pelvis as well as around the liver and spleen. Musculoskeletal: No acute or significant osseous findings. IMPRESSION: Minimal cholelithiasis. Probable mild gallbladder wall thickening is noted with mild surrounding inflammatory changes suggesting possible cholecystitis. Ultrasound is recommended for further evaluation. Minimal to mild ascites is noted. Aortic Atherosclerosis (ICD10-I70.0). Electronically Signed   By: Marijo Conception M.D.   On: 02/19/2021 10:21   DG Chest Port 1 View  Result Date: 02/19/2021 CLINICAL DATA:  Dizziness.  Falls.  Dialysis patient. EXAM: PORTABLE CHEST 1 VIEW COMPARISON:  Chest x-ray 11/23/2019. FINDINGS: Cardiomegaly with pulmonary venous congestion and bilateral  interstitial prominence consistent CHF. Low lung volumes. No pleural effusion or pneumothorax. IMPRESSION: Cardiomegaly with pulmonary venous congestion and bilateral interstitial prominence consistent with CHF. Low lung volumes. Electronically Signed   By: Marcello Moores  Register   On: 02/19/2021 09:41    Chest x-ray cardiomegaly vascular congestion  CT scan reviewed, cholelithiasis, some possible mild gallbladder wall thickening.  Recommendation for right upper quadrant ultrasound.  I reviewed these images and I did discuss with this with Dr. Blaine Hamper our hospitalist, will plan further work-up of the abdomen and gallbladder as an inpatient but at present time priority is on treatment for hyperkalemia and dialysis ____________________________________________   PROCEDURES  Procedure(s) performed: None  Procedures  Critical Care performed: Yes, see critical care note(s)  CRITICAL CARE Performed by: Delman Kitten   Total critical care time: 35 minutes  Critical care time was exclusive of separately billable procedures and treating other patients.  Critical care was necessary to treat or prevent imminent or life-threatening deterioration.  Critical care was time spent personally by me on the following activities: development of treatment plan with patient and/or surrogate as well as nursing, discussions with consultants, evaluation of patient's response to treatment, examination of patient, obtaining history from patient or surrogate, ordering and performing treatments and interventions, ordering and review of laboratory studies, ordering and review of radiographic studies, pulse oximetry and re-evaluation of patient's condition.  ____________________________________________   INITIAL IMPRESSION / ASSESSMENT AND PLAN / ED COURSE  Pertinent labs & imaging results that were available during my care of the patient were reviewed by me and considered in my medical decision making (see chart for  details).   Patient presents for somewhat nonspecific symptoms including nausea burning chest discomfort a couple days ago, given his clinical history as well as his fall, will obtain CT imaging of the abdomen pelvis without contrast  Patient's lab returned significantly elevated potassium, discussed immediately with nephrology, hyperkalemia treatments initiated and emergent dialysis requested.     Admit discussed with with Dr. Blaine Hamper. Dr. Holley Raring seeing and arranging emergent HD from nephrology.   Vitals:   02/19/21 1500 02/19/21 1515  BP: (!) 142/69 (!) 150/71  Pulse: 69 72  Resp: 12 13  Temp:    SpO2:       ____________________________________________   FINAL CLINICAL IMPRESSION(S) / ED DIAGNOSES  Final diagnoses:  Hyperkalemia  Gallstones  Lightheadedness  Note:  This document was prepared using Systems analyst and may include unintentional dictation errors       Delman Kitten, MD 02/19/21 1519

## 2021-02-19 NOTE — Progress Notes (Signed)
Pt's blood sugar less than 60, ed rn to secure dextrose iv for pt. Pt is alert.

## 2021-02-19 NOTE — Progress Notes (Signed)
AVF accessed with 15 g needle, positive bruit and thrill. Pt reports one "needle goes up the other down", arterial pointed down, venous towards heart. No complications noted with accessing, blood return noted, flushes easily without pain per pt

## 2021-02-19 NOTE — ED Notes (Signed)
Lindsay RN aware of assigned bed 

## 2021-02-19 NOTE — ED Notes (Signed)
Per patient, he does not produce urine due to being on dialysis.

## 2021-02-19 NOTE — Progress Notes (Signed)
Shantelle green, np notified of hypoglycemia, orders received for prn dextrose iv push. Primary RN notified of order.

## 2021-02-19 NOTE — ED Triage Notes (Addendum)
C/O dizziness since Sunday and fall yesterday.  Bruising seen to [facility].  States has not had dialysis in 4 days.  Usually M-W-F, missed yesterday due to not feeling well.  Also c/o SOB

## 2021-02-20 ENCOUNTER — Other Ambulatory Visit: Payer: Self-pay

## 2021-02-20 LAB — BASIC METABOLIC PANEL
Anion gap: 12 (ref 5–15)
Anion gap: 12 (ref 5–15)
Anion gap: 12 (ref 5–15)
BUN: 58 mg/dL — ABNORMAL HIGH (ref 8–23)
BUN: 31 mg/dL — ABNORMAL HIGH (ref 8–23)
BUN: 39 mg/dL — ABNORMAL HIGH (ref 8–23)
CO2: 27 mmol/L (ref 22–32)
CO2: 28 mmol/L (ref 22–32)
CO2: 29 mmol/L (ref 22–32)
Calcium: 7.4 mg/dL — ABNORMAL LOW (ref 8.9–10.3)
Calcium: 7.9 mg/dL — ABNORMAL LOW (ref 8.9–10.3)
Calcium: 7.7 mg/dL — ABNORMAL LOW (ref 8.9–10.3)
Chloride: 95 mmol/L — ABNORMAL LOW (ref 98–111)
Chloride: 96 mmol/L — ABNORMAL LOW (ref 98–111)
Chloride: 96 mmol/L — ABNORMAL LOW (ref 98–111)
Creatinine, Ser: 6.16 mg/dL — ABNORMAL HIGH (ref 0.61–1.24)
Creatinine, Ser: 4.05 mg/dL — ABNORMAL HIGH (ref 0.61–1.24)
Creatinine, Ser: 4.63 mg/dL — ABNORMAL HIGH (ref 0.61–1.24)
GFR, Estimated: 9 mL/min — ABNORMAL LOW (ref >60)
GFR, Estimated: 16 mL/min — ABNORMAL LOW (ref >60)
GFR, Estimated: 13 mL/min — ABNORMAL LOW (ref >60)
Glucose, Bld: 83 mg/dL (ref 70–99)
Glucose, Bld: 223 mg/dL — ABNORMAL HIGH (ref 70–99)
Glucose, Bld: 228 mg/dL — ABNORMAL HIGH (ref 70–99)
Potassium: 6.2 mmol/L — ABNORMAL HIGH (ref 3.5–5.1)
Potassium: 4.5 mmol/L (ref 3.5–5.1)
Potassium: 4.8 mmol/L (ref 3.5–5.1)
Sodium: 134 mmol/L — ABNORMAL LOW (ref 135–145)
Sodium: 136 mmol/L (ref 135–145)
Sodium: 137 mmol/L (ref 135–145)

## 2021-02-20 LAB — CBC
HCT: 28 % — ABNORMAL LOW (ref 39.0–52.0)
Hemoglobin: 9.2 g/dL — ABNORMAL LOW (ref 13.0–17.0)
MCH: 32.4 pg (ref 26.0–34.0)
MCHC: 32.9 g/dL (ref 30.0–36.0)
MCV: 98.6 fL (ref 80.0–100.0)
Platelets: 116 10*3/uL — ABNORMAL LOW (ref 150–400)
RBC: 2.84 MIL/uL — ABNORMAL LOW (ref 4.22–5.81)
RDW: 14.4 % (ref 11.5–15.5)
WBC: 3.7 10*3/uL — ABNORMAL LOW (ref 4.0–10.5)
nRBC: 0 % (ref 0.0–0.2)

## 2021-02-20 LAB — PROTIME-INR
INR: 1.2 (ref 0.8–1.2)
Prothrombin Time: 15.6 seconds — ABNORMAL HIGH (ref 11.4–15.2)

## 2021-02-20 LAB — LIPID PANEL
Cholesterol: 115 mg/dL (ref 0–200)
HDL: 51 mg/dL (ref >40)
LDL Cholesterol: 55 mg/dL (ref 0–99)
Total CHOL/HDL Ratio: 2.3 RATIO
Triglycerides: 43 mg/dL (ref <150)
VLDL: 9 mg/dL (ref 0–40)

## 2021-02-20 LAB — GLUCOSE, CAPILLARY
Glucose-Capillary: 87 mg/dL (ref 70–99)
Glucose-Capillary: 218 mg/dL — ABNORMAL HIGH (ref 70–99)
Glucose-Capillary: 147 mg/dL — ABNORMAL HIGH (ref 70–99)

## 2021-02-20 LAB — HEMOGLOBIN A1C
Hgb A1c MFr Bld: 6.1 % — ABNORMAL HIGH (ref 4.8–5.6)
Mean Plasma Glucose: 128 mg/dL

## 2021-02-20 LAB — PHOSPHORUS: Phosphorus: 5.3 mg/dL — ABNORMAL HIGH (ref 2.5–4.6)

## 2021-02-20 MED ORDER — ASPIRIN EC 81 MG PO TBEC
81.0000 mg | DELAYED_RELEASE_TABLET | Freq: Every day | ORAL | Status: DC
  Administered 2021-02-20 – 2021-02-21 (×2): 81 mg via ORAL
  Filled 2021-02-20 (×2): qty 1

## 2021-02-20 MED ORDER — LISINOPRIL 20 MG PO TABS: 20.0000 mg | ORAL_TABLET | Freq: Every day | ORAL | Status: DC

## 2021-02-20 MED ORDER — CARVEDILOL 3.125 MG PO TABS
3.1250 mg | ORAL_TABLET | Freq: Two times a day (BID) | ORAL | Status: DC
  Administered 2021-02-20 – 2021-02-21 (×2): 3.125 mg via ORAL
  Filled 2021-02-20 (×2): qty 1

## 2021-02-20 MED ORDER — INSULIN ASPART PROT & ASPART (70-30 MIX) 100 UNIT/ML ~~LOC~~ SUSP
5.0000 [IU] | Freq: Every day | SUBCUTANEOUS | Status: DC
  Administered 2021-02-21: 5 [IU] via SUBCUTANEOUS
  Filled 2021-02-20: qty 10

## 2021-02-20 MED ORDER — CALCITRIOL 0.25 MCG PO CAPS
0.2500 ug | ORAL_CAPSULE | Freq: Every day | ORAL | Status: DC
  Administered 2021-02-21: 0.25 ug via ORAL
  Filled 2021-02-20: qty 1

## 2021-02-20 MED ORDER — FUROSEMIDE 40 MG PO TABS
40.0000 mg | ORAL_TABLET | Freq: Every day | ORAL | Status: DC
  Administered 2021-02-20 – 2021-02-21 (×2): 40 mg via ORAL
  Filled 2021-02-20 (×2): qty 1

## 2021-02-20 MED ORDER — AMLODIPINE BESYLATE 5 MG PO TABS
5.0000 mg | ORAL_TABLET | Freq: Every day | ORAL | Status: DC
  Administered 2021-02-20: 5 mg via ORAL
  Filled 2021-02-20: qty 1

## 2021-02-20 MED ORDER — SEVELAMER CARBONATE 800 MG PO TABS
3200.0000 mg | ORAL_TABLET | Freq: Three times a day (TID) | ORAL | Status: DC
  Administered 2021-02-21: 3200 mg via ORAL
  Filled 2021-02-20: qty 4

## 2021-02-20 MED ORDER — HEPARIN SODIUM (PORCINE) 5000 UNIT/ML IJ SOLN
5000.0000 [IU] | Freq: Three times a day (TID) | INTRAMUSCULAR | Status: DC
  Administered 2021-02-20 – 2021-02-21 (×2): 5000 [IU] via SUBCUTANEOUS
  Filled 2021-02-20 (×2): qty 1

## 2021-02-20 MED ORDER — HEPARIN SODIUM (PORCINE) 5000 UNIT/ML IJ SOLN: 5000.0000 [IU] | Freq: Three times a day (TID) | INTRAMUSCULAR | Status: DC

## 2021-02-20 MED ORDER — TRAZODONE HCL 50 MG PO TABS
50.0000 mg | ORAL_TABLET | Freq: Every day | ORAL | Status: DC
  Administered 2021-02-20: 50 mg via ORAL
  Filled 2021-02-20: qty 1

## 2021-02-20 MED ORDER — CINACALCET HCL 30 MG PO TABS: 30.0000 mg | ORAL_TABLET | Freq: Every day | ORAL | Status: DC

## 2021-02-20 MED ORDER — INSULIN ASPART PROT & ASPART (70-30 MIX) 100 UNIT/ML ~~LOC~~ SUSP: 4.0000 [IU] | Freq: Every day | SUBCUTANEOUS | Status: DC

## 2021-02-20 MED ORDER — CALCIUM ACETATE (PHOS BINDER) 667 MG PO CAPS
2001.0000 mg | ORAL_CAPSULE | Freq: Three times a day (TID) | ORAL | Status: DC
  Administered 2021-02-21 (×2): 2001 mg via ORAL
  Filled 2021-02-20 (×3): qty 3

## 2021-02-20 MED ORDER — GABAPENTIN 300 MG PO CAPS
300.0000 mg | ORAL_CAPSULE | Freq: Every day | ORAL | Status: DC
  Administered 2021-02-20 – 2021-02-21 (×2): 300 mg via ORAL
  Filled 2021-02-20 (×2): qty 1

## 2021-02-20 NOTE — Progress Notes (Signed)
[facility]  ROUNDING NOTE   Subjective:   Danny [name]. Danny [name] is a 65 year old male with a past medical history of hypertension, diabetes, gout, and end-stage renal disease requiring dialysis.  He presents to the emergency room with complaints of dizziness and weakness.  He reportedly missed his dialysis treatment yesterday due to not feeling well.  Patient seen laying in bed Wife at bedside Alert and oriented Interview conducted with Spanish video interpretor Continues to tolerate meals, denies nausea States he hasn't been out of bed to evaluate dizziness Currently on room air  Patient seen later during dialysis   HEMODIALYSIS FLOWSHEET:  Blood Flow Rate (mL/min): 400 mL/min Arterial Pressure (mmHg): -190 mmHg Venous Pressure (mmHg): 210 mmHg Transmembrane Pressure (mmHg): 70 mmHg Ultrafiltration Rate (mL/min): 500 mL/min Dialysate Flow Rate (mL/min): 500 ml/min Conductivity: Machine : 13.6 Conductivity: Machine : 13.6 Dialysis Fluid Bolus: Normal Saline Bolus Amount (mL): 200 mL Dialysate Change: 2K  Objective:  Vital signs in last 24 hours:  Temp:  [97.4 F (36.3 C)-98.4 F (36.9 C)] 97.6 F (36.4 C) (05/25 0833) Pulse Rate:  [62-82] 70 (05/25 0833) Resp:  [11-18] 18 (05/25 0833) BP: (117-170)/(59-87) 129/67 (05/25 0833) SpO2:  [94 %-100 %] 99 % (05/25 0833) Weight:  [94.1 kg] 94.1 kg (05/24 1720)  Weight change:  Filed Weights   02/19/21 0837 02/19/21 1720  Weight: 96 kg 94.1 kg    Intake/Output: I/O last 3 completed shifts: In: 774.3 [P.O.:480; I.V.:252.4; IV Piggyback:41.9] Out: 1000 [Other:1000]   Intake/Output this shift:  No intake/output data recorded.  Physical Exam: General: NAD, resting on bed  Head: Normocephalic, atraumatic. Moist oral mucosal membranes  Eyes: Anicteric  Lungs:  Clear to auscultation, normal breathing effort  Heart: Regular rate and rhythm  Abdomen:  Soft, nontender,   Extremities:  no peripheral edema.   Neurologic: Nonfocal, moving all four extremities  Skin: No lesions  Access: Lt upper AVF    Basic Metabolic Panel: Recent Labs  Lab 02/19/21 0850 02/20/21 0746  NA 133* 134*  K 7.3* 6.2*  CL 91* 95*  CO2 24 27  GLUCOSE 101* 83  BUN 91* 58*  CREATININE 8.63* 6.16*  CALCIUM 7.3* 7.4*    Liver Function Tests: Recent Labs  Lab 02/19/21 0850  AST 16  ALT 13  ALKPHOS 118  BILITOT 0.9  PROT 7.7  ALBUMIN 3.9   Recent Labs  Lab 02/19/21 0850  LIPASE 43   No results for input(s): AMMONIA in the last 168 hours.  CBC: Recent Labs  Lab 02/19/21 0850  WBC 6.8  HGB 11.0*  HCT 33.0*  MCV 97.3  PLT 140*    Cardiac Enzymes: No results for input(s): CKTOTAL, CKMB, CKMBINDEX, TROPONINI in the last 168 hours.  BNP: Invalid input(s): POCBNP  CBG: Recent Labs  Lab 02/19/21 1603 02/19/21 1728 02/19/21 1756 02/19/21 2116 02/20/21 0832  GLUCAP 80 67* 92 219* 87    Microbiology: Results for orders placed or performed during the hospital encounter of 02/19/21  Resp Panel by RT-PCR (Flu A&B, Covid) Nasopharyngeal Swab     Status: None   Collection Time: 02/19/21 11:44 AM   Specimen: Nasopharyngeal Swab; Nasopharyngeal(NP) swabs in vial transport medium  Result Value Ref Range Status   SARS Coronavirus 2 by RT PCR NEGATIVE NEGATIVE Final    Comment: (NOTE) SARS-CoV-2 target nucleic acids are NOT DETECTED.  The SARS-CoV-2 RNA is generally detectable in upper respiratory specimens during the acute phase of infection. The lowest concentration of SARS-CoV-2 viral  copies this assay can detect is 138 copies/mL. A negative result does not preclude SARS-Cov-2 infection and should not be used as the sole basis for treatment or other patient management decisions. A negative result may occur with  improper specimen collection/handling, submission of specimen other than nasopharyngeal swab, presence of viral mutation(s) within the areas targeted by this assay, and  inadequate number of viral copies(<138 copies/mL). A negative result must be combined with clinical observations, patient history, and epidemiological information. The expected result is Negative.  Fact Sheet for Patients:  EntrepreneurPulse.com.au  Fact Sheet for Healthcare Providers:  IncredibleEmployment.be  This test is no t yet approved or cleared by the [place] FDA and  has been authorized for detection and/or diagnosis of SARS-CoV-2 by FDA under an Emergency Use Authorization (EUA). This EUA will remain  in effect (meaning this test can be used) for the duration of the COVID-19 declaration under Section 564(b)(1) of the Act, 21 U.S.C.section 360bbb-3(b)(1), unless the authorization is terminated  or revoked sooner.       Influenza A by PCR NEGATIVE NEGATIVE Final   Influenza B by PCR NEGATIVE NEGATIVE Final    Comment: (NOTE) The Xpert Xpress SARS-CoV-2/FLU/RSV plus assay is intended as an aid in the diagnosis of influenza from Nasopharyngeal swab specimens and should not be used as a sole basis for treatment. Nasal washings and aspirates are unacceptable for Xpert Xpress SARS-CoV-2/FLU/RSV testing.  Fact Sheet for Patients: EntrepreneurPulse.com.au  Fact Sheet for Healthcare Providers: IncredibleEmployment.be  This test is not yet approved or cleared by the [place] FDA and has been authorized for detection and/or diagnosis of SARS-CoV-2 by FDA under an Emergency Use Authorization (EUA). This EUA will remain in effect (meaning this test can be used) for the duration of the COVID-19 declaration under Section 564(b)(1) of the Act, 21 U.S.C. section 360bbb-3(b)(1), unless the authorization is terminated or revoked.  Performed at [facility], [address] [place], [place] [address]     Coagulation Studies: Recent Labs    02/19/21 0850  LABPROT 15.4*  INR 1.2     Urinalysis: No results for input(s): COLORURINE, LABSPEC, PHURINE, GLUCOSEU, HGBUR, BILIRUBINUR, KETONESUR, PROTEINUR, UROBILINOGEN, NITRITE, LEUKOCYTESUR in the last 72 hours.  Invalid input(s): APPERANCEUR    Imaging: CT ABDOMEN PELVIS WO CONTRAST  Result Date: 02/19/2021 CLINICAL DATA:  Left flank bruising after multiple falls. EXAM: CT ABDOMEN AND PELVIS WITHOUT CONTRAST TECHNIQUE: Multidetector CT imaging of the abdomen and pelvis was performed following the standard protocol without IV contrast. COMPARISON:  July 27, 2018. FINDINGS: Lower chest: No acute abnormality. Hepatobiliary: Minimal cholelithiasis is noted. Probable mild gallbladder wall thickening is noted with mild inflammatory changes suggesting possible cholecystitis. No biliary dilatation is noted. Small amount of fluid is noted around the liver. The liver is otherwise unremarkable. Pancreas: Unremarkable. No pancreatic ductal dilatation or surrounding inflammatory changes. Spleen: Normal in size without focal abnormality. Adrenals/Urinary Tract: Adrenal glands appear normal. Bilateral renal atrophy is noted consistent with history of end-stage renal disease. No hydronephrosis or renal obstruction is noted. Urinary bladder is decompressed. Stomach/Bowel: Stomach is within normal limits. Appendix appears normal. No evidence of bowel wall thickening, distention, or inflammatory changes. Vascular/Lymphatic: Aortic atherosclerosis. No enlarged abdominal or pelvic lymph nodes. Reproductive: Prostate is unremarkable. Other: No hernia is noted. Minimal to mild ascites is noted in the pelvis as well as around the liver and spleen. Musculoskeletal: No acute or significant osseous findings. IMPRESSION: Minimal cholelithiasis. Probable mild gallbladder wall thickening is noted with  mild surrounding inflammatory changes suggesting possible cholecystitis. Ultrasound is recommended for further evaluation. Minimal to mild ascites is noted.  Aortic Atherosclerosis (ICD10-I70.0). Electronically Signed   By: Marijo Conception M.D.   On: 02/19/2021 10:21   CT HEAD WO CONTRAST  Result Date: 02/19/2021 CLINICAL DATA:  Dizziness over the last 3 days.  Fell yesterday. EXAM: CT HEAD WITHOUT CONTRAST TECHNIQUE: Contiguous axial images were obtained from the base of the skull through the vertex without intravenous contrast. COMPARISON:  08/02/2019 FINDINGS: Brain: Mild age related volume loss. No sign of acute infarction, mass lesion, hemorrhage, hydrocephalus or extra-axial collection. Vascular: There is atherosclerotic calcification of the major vessels at the base of the brain. Skull: Negative Sinuses/Orbits: Mild mucosal thickening in the sphenoid sinus. No advanced finding. Orbits negative. Other: None IMPRESSION: No acute or traumatic finding.  Mild age related volume loss. Electronically Signed   By: Nelson Chimes M.D.   On: 02/19/2021 18:33   DG Chest Port 1 View  Result Date: 02/19/2021 CLINICAL DATA:  Dizziness.  Falls.  Dialysis patient. EXAM: PORTABLE CHEST 1 VIEW COMPARISON:  Chest x-ray 11/23/2019. FINDINGS: Cardiomegaly with pulmonary venous congestion and bilateral interstitial prominence consistent CHF. Low lung volumes. No pleural effusion or pneumothorax. IMPRESSION: Cardiomegaly with pulmonary venous congestion and bilateral interstitial prominence consistent with CHF. Low lung volumes. Electronically Signed   By: Marcello Moores  Register   On: 02/19/2021 09:41     Medications:   . sodium chloride    . sodium chloride     . Chlorhexidine Gluconate Cloth  6 each Topical Q0600  . dextrose  1 ampule Intravenous UD  . heparin injection (subcutaneous)  5,000 Units Subcutaneous Q8H  . insulin aspart  0-5 Units Subcutaneous QHS  . insulin aspart  0-9 Units Subcutaneous TID WC   sodium chloride, sodium chloride, acetaminophen, alteplase, heparin, hydrALAZINE, hydrOXYzine, lidocaine (PF), lidocaine-prilocaine, morphine injection,  pentafluoroprop-tetrafluoroeth  Assessment/ Plan:  Danny [name] is a 65 y.o.  male with a past medical history of hypertension, diabetes, gout, and end-stage renal disease requiring dialysis.  He presents to the emergency room with complaints of dizziness and weakness.  He reportedly missed his dialysis treatment yesterday due to not feeling well.  MWF [facility] Left AVF  1. End Stage Renal Disease with hyperkalemia on hemodialysis: on MWF Last hemodialysis 02/15/21. Potassium improved to 6.2 Received emergent HD yesterday in ED for elevated potassium UF 1L achieved Became hypoglycemic during treatment and receive amp of D5. HD continued with glucose monitoring Scheduled for dialysis today to maintain outpatient schedule This will further correct potassium levels No bleeding concerns from access  2. Anemia of chronic kidney disease  Lab Results  Component Value Date   HGB 11.0 (L) 02/19/2021   Hgb above target No EPO at this time  3. Secondary Hyperparathyroidism:   Lab Results  Component Value Date   PTH 359 (H) 08/01/2019   CALCIUM 7.4 (L) 02/20/2021   PHOS 9.1 (H) 08/03/2019   Will obtain updated phosphorus level with dialysis Decreased calcium levels  Calcium acetate and sevelamer with meals Will check phosphorus with dialysis  4 Diabetes mellitus type II with chronic kidney disease  insulin dependent. Home regimen includes Novolog 70/30.  Glucose stable at this time Primary team will manage SSI     LOS: 1 Madisun Hargrove 5/25/202210:09 AM

## 2021-02-20 NOTE — Progress Notes (Signed)
PT Cancellation Note  Patient Details Name: Danny [name] MRN: [id number] DOB: 14-Oct-1955   Cancelled Treatment:    Reason Eval/Treat Not Completed: Medical issues which prohibited therapy: Pt's Ka 6.2 falling outside guidelines for participation with PT services.  Will attempt to see pt at a future date/time as medically appropriate.     Linus Salmons PT, DPT 02/20/21, 8:54 AM

## 2021-02-20 NOTE — Progress Notes (Signed)
OT Cancellation Note  Patient Details Name: Danny [name] MRN: [id number] DOB: 06/16/1956   Cancelled Treatment:    Reason Eval/Treat Not Completed: Medical issues which prohibited therapy. Order received and chart reviewed. Pt noted with most recent K+ value of 6.2. While potassium is trending down, this value is outside of the parameters recommended for exertional activity. Will continue to follow and initiate OT serves once pt medically appropriate for OT evaluation.   Shara Blazing, M.S., OTR/L Ascom: [phone] 02/20/21, 8:08 AM   Danny [name] 02/20/2021, 8:05 AM

## 2021-02-20 NOTE — Progress Notes (Signed)
Pt HD tx ended w/ no complications. UF goal of 1L met. Pt alert, no c/o, vss post tx. No glucometer in HD unit and pt is diabetic. Apple juice and graham crackers provided per pt request and hypoglyemic post last HD tx. Report to primary RN. Central monitoring notified of patient tx end. Post BMP and Phosphorus drawn per order.

## 2021-02-20 NOTE — Progress Notes (Signed)
Pre HD info 

## 2021-02-20 NOTE — Progress Notes (Addendum)
PROGRESS NOTE    Vernon Zettler  [id number] DOB: September 17, 1956 DOA: 02/19/2021 PCP: Patient, No Pcp Per (Inactive)  Outpatient Specialists: nephrology    Brief Narrative:   Tyral Heinzen is a 65 y.o. male with medical history significant of ESRD-HD (MWF), hypertension, diabetes mellitus, GERD, gout, depression, CHF with EF 35-40%, rectal bleeding, who presents with fall.  Patient states that she had dizziness and fell yesterday.  Denies loss of consciousness.  Denies significant injury.  Denies headache or neck pain.  Patient has some bruise to LLQ. Patient states that he had chest pain 2 days ago, which has resolved.  Patient has mild cough and mild shortness of breath currently, denies fever or chills.  Patient also had nausea and mild abdominal pain yesterday, which has resolved.  Currently patient does not have nausea, vomiting, diarrhea or abdominal pain.  No symptoms of UTI.  Patient denies unilateral numbness or tingling in extremities.  No facial droop or slurred speech.  Patient missed dialysis yesterday due to not feeling well.  ED Course: pt was found to have potassium of 7.3, WBC 6.8, INR 1.2, troponin level 29, negative COVID PCR, bicarbonate 24, creatinine 8.63, BUN 91, liver function normal, temperature normal, blood pressure 133/76, heart rate 69, RR 24, oxygen saturation 95% on room air.  Chest x-ray showed cardiomegaly, vascular congestion and mild interstitial edema.  Patient is admitted to progressive bed as inpatient.  Dr. Holley Raring of nephrology is consulted for urgent dialysis.    Assessment & Plan:   Principal Problem:   Hyperkalemia Active Problems:   Hypertension   Depression   Abdominal pain   Elevated troponin   Fall at home, initial encounter   Type II diabetes mellitus with renal manifestations ([place])   ESRD (end stage renal disease) on dialysis (HCC)   Chronic systolic CHF (congestive heart failure) (HCC)  Hyperkalemia and ESRD (end stage renal disease)  on dialysis (MWF): K 7.3. HR 69, in setting of missed dialysis. Received dialysis 5/24 and k. S/p calcium gluconate, insulin. improved to 6.2 this morning - for dialysis again today - will check bmp in pm  Hypertension -IV hydralazine as needed -Amlodipine, lisinopril - volume reduction w/ dialysis  Depression -Continue home medications  Abdominal pain: Patient had mild abdominal pain which has resolved.  CT abdomen/pelvis showed some abnormalities, "minimal cholelithiasis. Probable mild gallbladder wall thickening is noted with mild surrounding inflammatory changes suggesting possible cholecystitis".  Currently patient does not have any GI symptoms.  Liver function normal.  No fever or leukocytosis.  Clinically less likely to have cholecystitis. -Observe closely, no symptoms cholecystitis  Fall at home, initial encounter -ct head, abd/pelvis neg -PT/OT  Type II diabetes mellitus with renal manifestations [facility]): Recent A1c 6.0, well controlled.  Patient is taking 70/30 insulin at home -SSI -Decreased 70/30 insulin dose from 10-8 unit to 5-4 units twice daily  Chronic systolic CHF (congestive heart failure) ([place]): 2D echo on 05/29/2019 showed EF 35-40%.  Chest x-ray showed cardiomegaly, vascular congestion and mild interstitial edema. -Volume management per renal by dialysis - re-start coreg   DVT prophylaxis: heparin Code Status: full Family Communication: none @ bedside  Level of care: Progressive Cardiac Status is: Inpatient  Remains inpatient appropriate because:Inpatient level of care appropriate due to severity of illness   Dispo: The patient is from: Home              Anticipated d/c is to: Home  Patient currently is not medically stable to d/c.   Difficult to place patient No        Consultants:  nephrology  Procedures: none  Antimicrobials:  none    Subjective: This morning feeling ok, no pain, has appetite, no palpitations or  sob.  Objective: Vitals:   02/19/21 2335 02/20/21 0413 02/20/21 0833 02/20/21 0950  BP: (!) 149/76 124/86 129/67 135/74  Pulse: 76 70 70 70  Resp: '18 16 18 20  '$ Temp: 98.3 F (36.8 C) 98.2 F (36.8 C) 97.6 F (36.4 C) 97.6 F (36.4 C)  TempSrc: Oral   Oral  SpO2: 98% 99% 99% 99%  Weight:      Height:        Intake/Output Summary (Last 24 hours) at 02/20/2021 1016 Last data filed at 02/20/2021 0950 Gross per 24 hour  Intake 1014.33 ml  Output 1000 ml  Net 14.33 ml   Filed Weights   02/19/21 0837 02/19/21 1720  Weight: 96 kg 94.1 kg    Examination:  General exam: Appears calm and comfortable  Respiratory system: rales @ bases Cardiovascular system: S1 & S2 heard, RRR. No JVD, murmurs, rubs, gallops or clicks. Trace pedal edema Gastrointestinal system: Abdomen is nondistended, soft and nontender. No organomegaly or masses felt. Normal bowel sounds heard. Central nervous system: Alert and oriented. No focal neurological deficits. Extremities: Symmetric 5 x 5 power. Skin: No rashes, lesions or ulcers Psychiatry: Judgement and insight appear normal. Mood & affect appropriate.     Data Reviewed: I have personally reviewed following labs and imaging studies  CBC: Recent Labs  Lab 02/19/21 0850  WBC 6.8  HGB 11.0*  HCT 33.0*  MCV 97.3  PLT [id number]*   Basic Metabolic Panel: Recent Labs  Lab 02/19/21 0850 02/20/21 0746  NA 133* 134*  K 7.3* 6.2*  CL 91* 95*  CO2 24 27  GLUCOSE 101* 83  BUN 91* 58*  CREATININE 8.63* 6.16*  CALCIUM 7.3* 7.4*   GFR: Estimated Creatinine Clearance: 13.5 mL/min (A) (by C-G formula based on SCr of 6.16 mg/dL (H)). Liver Function Tests: Recent Labs  Lab 02/19/21 0850  AST 16  ALT 13  ALKPHOS 118  BILITOT 0.9  PROT 7.7  ALBUMIN 3.9   Recent Labs  Lab 02/19/21 0850  LIPASE 43   No results for input(s): AMMONIA in the last 168 hours. Coagulation Profile: Recent Labs  Lab 02/19/21 0850  INR 1.2   Cardiac Enzymes: No  results for input(s): CKTOTAL, CKMB, CKMBINDEX, TROPONINI in the last 168 hours. BNP (last 3 results) No results for input(s): PROBNP in the last 8760 hours. HbA1C: No results for input(s): HGBA1C in the last 72 hours. CBG: Recent Labs  Lab 02/19/21 1603 02/19/21 1728 02/19/21 1756 02/19/21 2116 02/20/21 0832  GLUCAP 80 67* 92 219* 87   Lipid Profile: Recent Labs    02/20/21 0507  CHOL 115  HDL 51  LDLCALC 55  TRIG 43  CHOLHDL 2.3   Thyroid Function Tests: No results for input(s): TSH, T4TOTAL, FREET4, T3FREE, THYROIDAB in the last 72 hours. Anemia Panel: No results for input(s): VITAMINB12, FOLATE, FERRITIN, TIBC, IRON, RETICCTPCT in the last 72 hours. Urine analysis:    Component Value Date/Time   COLORURINE YELLOW (A) 09/22/2017 1006   APPEARANCEUR CLEAR (A) 09/22/2017 1006   APPEARANCEUR Clear 09/10/2013 2254   LABSPEC 1.008 09/22/2017 1006   LABSPEC 1.003 09/10/2013 2254   PHURINE 9.0 (H) 09/22/2017 1006   GLUCOSEU 150 (A) 09/22/2017 1006  GLUCOSEU >=500 09/10/2013 2254   HGBUR NEGATIVE 09/22/2017 [place] 09/22/2017 1006   BILIRUBINUR Negative 09/10/2013 [place] 09/22/2017 1006   PROTEINUR 100 (A) 09/22/2017 1006   NITRITE NEGATIVE 09/22/2017 1006   LEUKOCYTESUR NEGATIVE 09/22/2017 1006   LEUKOCYTESUR Negative 09/10/2013 2254   Sepsis Labs: '@LABRCNTIP'$ (procalcitonin:4,lacticidven:4)  ) Recent Results (from the past 240 hour(s))  Resp Panel by RT-PCR (Flu A&B, Covid) Nasopharyngeal Swab     Status: None   Collection Time: 02/19/21 11:44 AM   Specimen: Nasopharyngeal Swab; Nasopharyngeal(NP) swabs in vial transport medium  Result Value Ref Range Status   SARS Coronavirus 2 by RT PCR NEGATIVE NEGATIVE Final    Comment: (NOTE) SARS-CoV-2 target nucleic acids are NOT DETECTED.  The SARS-CoV-2 RNA is generally detectable in upper respiratory specimens during the acute phase of infection. The lowest concentration of  SARS-CoV-2 viral copies this assay can detect is 138 copies/mL. A negative result does not preclude SARS-Cov-2 infection and should not be used as the sole basis for treatment or other patient management decisions. A negative result may occur with  improper specimen collection/handling, submission of specimen other than nasopharyngeal swab, presence of viral mutation(s) within the areas targeted by this assay, and inadequate number of viral copies(<138 copies/mL). A negative result must be combined with clinical observations, patient history, and epidemiological information. The expected result is Negative.  Fact Sheet for Patients:  EntrepreneurPulse.com.au  Fact Sheet for Healthcare Providers:  IncredibleEmployment.be  This test is no t yet approved or cleared by the [place] FDA and  has been authorized for detection and/or diagnosis of SARS-CoV-2 by FDA under an Emergency Use Authorization (EUA). This EUA will remain  in effect (meaning this test can be used) for the duration of the COVID-19 declaration under Section 564(b)(1) of the Act, 21 U.S.C.section 360bbb-3(b)(1), unless the authorization is terminated  or revoked sooner.       Influenza A by PCR NEGATIVE NEGATIVE Final   Influenza B by PCR NEGATIVE NEGATIVE Final    Comment: (NOTE) The Xpert Xpress SARS-CoV-2/FLU/RSV plus assay is intended as an aid in the diagnosis of influenza from Nasopharyngeal swab specimens and should not be used as a sole basis for treatment. Nasal washings and aspirates are unacceptable for Xpert Xpress SARS-CoV-2/FLU/RSV testing.  Fact Sheet for Patients: EntrepreneurPulse.com.au  Fact Sheet for Healthcare Providers: IncredibleEmployment.be  This test is not yet approved or cleared by the [place] FDA and has been authorized for detection and/or diagnosis of SARS-CoV-2 by FDA under an Emergency Use  Authorization (EUA). This EUA will remain in effect (meaning this test can be used) for the duration of the COVID-19 declaration under Section 564(b)(1) of the Act, 21 U.S.C. section 360bbb-3(b)(1), unless the authorization is terminated or revoked.  Performed at [facility], [address], [place], [place] [address]          Radiology Studies: CT ABDOMEN PELVIS WO CONTRAST  Result Date: 02/19/2021 CLINICAL DATA:  Left flank bruising after multiple falls. EXAM: CT ABDOMEN AND PELVIS WITHOUT CONTRAST TECHNIQUE: Multidetector CT imaging of the abdomen and pelvis was performed following the standard protocol without IV contrast. COMPARISON:  July 27, 2018. FINDINGS: Lower chest: No acute abnormality. Hepatobiliary: Minimal cholelithiasis is noted. Probable mild gallbladder wall thickening is noted with mild inflammatory changes suggesting possible cholecystitis. No biliary dilatation is noted. Small amount of fluid is noted around the liver. The liver is otherwise unremarkable. Pancreas: Unremarkable. No pancreatic ductal dilatation  or surrounding inflammatory changes. Spleen: Normal in size without focal abnormality. Adrenals/Urinary Tract: Adrenal glands appear normal. Bilateral renal atrophy is noted consistent with history of end-stage renal disease. No hydronephrosis or renal obstruction is noted. Urinary bladder is decompressed. Stomach/Bowel: Stomach is within normal limits. Appendix appears normal. No evidence of bowel wall thickening, distention, or inflammatory changes. Vascular/Lymphatic: Aortic atherosclerosis. No enlarged abdominal or pelvic lymph nodes. Reproductive: Prostate is unremarkable. Other: No hernia is noted. Minimal to mild ascites is noted in the pelvis as well as around the liver and spleen. Musculoskeletal: No acute or significant osseous findings. IMPRESSION: Minimal cholelithiasis. Probable mild gallbladder wall thickening is noted with mild surrounding  inflammatory changes suggesting possible cholecystitis. Ultrasound is recommended for further evaluation. Minimal to mild ascites is noted. Aortic Atherosclerosis (ICD10-I70.0). Electronically Signed   By: Marijo Conception M.D.   On: 02/19/2021 10:21   CT HEAD WO CONTRAST  Result Date: 02/19/2021 CLINICAL DATA:  Dizziness over the last 3 days.  Fell yesterday. EXAM: CT HEAD WITHOUT CONTRAST TECHNIQUE: Contiguous axial images were obtained from the base of the skull through the vertex without intravenous contrast. COMPARISON:  08/02/2019 FINDINGS: Brain: Mild age related volume loss. No sign of acute infarction, mass lesion, hemorrhage, hydrocephalus or extra-axial collection. Vascular: There is atherosclerotic calcification of the major vessels at the base of the brain. Skull: Negative Sinuses/Orbits: Mild mucosal thickening in the sphenoid sinus. No advanced finding. Orbits negative. Other: None IMPRESSION: No acute or traumatic finding.  Mild age related volume loss. Electronically Signed   By: Nelson Chimes M.D.   On: 02/19/2021 18:33   DG Chest Port 1 View  Result Date: 02/19/2021 CLINICAL DATA:  Dizziness.  Falls.  Dialysis patient. EXAM: PORTABLE CHEST 1 VIEW COMPARISON:  Chest x-ray 11/23/2019. FINDINGS: Cardiomegaly with pulmonary venous congestion and bilateral interstitial prominence consistent CHF. Low lung volumes. No pleural effusion or pneumothorax. IMPRESSION: Cardiomegaly with pulmonary venous congestion and bilateral interstitial prominence consistent with CHF. Low lung volumes. Electronically Signed   By: Marcello Moores  Register   On: 02/19/2021 09:41        Scheduled Meds: . Chlorhexidine Gluconate Cloth  6 each Topical Q0600  . dextrose  1 ampule Intravenous UD  . heparin injection (subcutaneous)  5,000 Units Subcutaneous Q8H  . insulin aspart  0-5 Units Subcutaneous QHS  . insulin aspart  0-9 Units Subcutaneous TID WC   Continuous Infusions: . sodium chloride    . sodium chloride        LOS: 1 day    Time spent: 35 min    Desma Maxim, MD [facility]   If 7PM-7AM, please contact night-coverage [email or web address] Password [facility] 02/20/2021, 10:16 AM

## 2021-02-20 NOTE — Progress Notes (Signed)
Pre HD RN assessment 

## 2021-02-21 LAB — BASIC METABOLIC PANEL
Anion gap: 12 (ref 5–15)
BUN: 45 mg/dL — ABNORMAL HIGH (ref 8–23)
CO2: 27 mmol/L (ref 22–32)
Calcium: 7.6 mg/dL — ABNORMAL LOW (ref 8.9–10.3)
Chloride: 98 mmol/L (ref 98–111)
Creatinine, Ser: 5.4 mg/dL — ABNORMAL HIGH (ref 0.61–1.24)
GFR, Estimated: 11 mL/min — ABNORMAL LOW (ref >60)
Glucose, Bld: 87 mg/dL (ref 70–99)
Potassium: 5.1 mmol/L (ref 3.5–5.1)
Sodium: 137 mmol/L (ref 135–145)

## 2021-02-21 LAB — HIV ANTIBODY (ROUTINE TESTING W REFLEX): HIV Screen 4th Generation wRfx: NONREACTIVE

## 2021-02-21 LAB — GLUCOSE, CAPILLARY
Glucose-Capillary: 85 mg/dL (ref 70–99)
Glucose-Capillary: 176 mg/dL — ABNORMAL HIGH (ref 70–99)

## 2021-02-21 MED ORDER — SODIUM ZIRCONIUM CYCLOSILICATE 10 G PO PACK
10.0000 g | PACK | Freq: Once | ORAL | Status: AC
  Administered 2021-02-21: 10 g via ORAL
  Filled 2021-02-21: qty 1

## 2021-02-21 MED ORDER — LISINOPRIL 20 MG PO TABS: 0.5000 | ORAL_TABLET | Freq: Every day | ORAL | Status: DC

## 2021-02-21 MED ORDER — LOKELMA 5 G PO PACK
5.0000 g | PACK | Freq: Every day | ORAL | 1 refills | Status: DC
Start: 2021-02-21 — End: 2024-05-27

## 2021-02-21 MED ORDER — INSULIN ASPART PROT & ASPART (70-30 MIX) 100 UNIT/ML ~~LOC~~ SUSP: 5.0000 [IU] | SUBCUTANEOUS | 11 refills | Status: DC

## 2021-02-21 NOTE — Progress Notes (Signed)
[facility]  ROUNDING NOTE   Subjective:   Eunice Blase. [name] is a 65 year old male with a past medical history of hypertension, diabetes, gout, and end-stage renal disease requiring dialysis.  He presents to the emergency room with complaints of dizziness and weakness.  He reportedly missed his dialysis treatment yesterday due to not feeling well.  Patient seen sitting up in chair Wife at bedside States dialysis went well Complains of discomfort from access during dialysis that leads to soreness throughout the evening. States that it starts with the needle sticks during initiation of treatment.   Objective:  Vital signs in last 24 hours:  Temp:  [97.7 F (36.5 C)-98.5 F (36.9 C)] 97.7 F (36.5 C) (05/26 1051) Pulse Rate:  [64-81] 70 (05/26 1051) Resp:  [12-19] 18 (05/26 1051) BP: (136-156)/(68-88) 142/71 (05/26 1051) SpO2:  [94 %-99 %] 99 % (05/26 1051) Weight:  [94 kg] 94 kg (05/26 0500)  Weight change: -2 kg Filed Weights   02/19/21 0837 02/19/21 1720 02/21/21 0500  Weight: 96 kg 94.1 kg 94 kg    Intake/Output: I/O last 3 completed shifts: In: 79 [P.O.:960] Out: 1000 [Other:1000]   Intake/Output this shift:  Total I/O In: 480 [P.O.:480] Out: 0   Physical Exam: General: NAD, sitting in chair  Head: Normocephalic, atraumatic. Moist oral mucosal membranes  Eyes: Anicteric  Lungs:  Clear to auscultation, normal breathing effort  Heart: Regular rate and rhythm  Abdomen:  Soft, nontender,   Extremities:  no peripheral edema.  Neurologic: Nonfocal, moving all four extremities  Skin: No lesions  Access: Lt upper AVF    Basic Metabolic Panel: Recent Labs  Lab 02/19/21 0850 02/20/21 0746 02/20/21 1454 02/20/21 1801 02/21/21 0700  NA 133* 134* 136 137 137  K 7.3* 6.2* 4.5 4.8 5.1  CL 91* 95* 96* 96* 98  CO2 '24 27 28 29 27  '$ GLUCOSE 101* 83 223* 228* 87  BUN 91* 58* 31* 39* 45*  CREATININE 8.63* 6.16* 4.05* 4.63* 5.40*  CALCIUM 7.3* 7.4* 7.9*  7.7* 7.6*  PHOS  --  5.3*  --   --   --     Liver Function Tests: Recent Labs  Lab 02/19/21 0850  AST 16  ALT 13  ALKPHOS 118  BILITOT 0.9  PROT 7.7  ALBUMIN 3.9   Recent Labs  Lab 02/19/21 0850  LIPASE 43   No results for input(s): AMMONIA in the last 168 hours.  CBC: Recent Labs  Lab 02/19/21 0850 02/20/21 1801  WBC 6.8 3.7*  HGB 11.0* 9.2*  HCT 33.0* 28.0*  MCV 97.3 98.6  PLT 140* 116*    Cardiac Enzymes: No results for input(s): CKTOTAL, CKMB, CKMBINDEX, TROPONINI in the last 168 hours.  BNP: Invalid input(s): POCBNP  CBG: Recent Labs  Lab 02/20/21 0832 02/20/21 1610 02/20/21 2058 02/21/21 0842 02/21/21 1118  GLUCAP 87 218* 147* 85 176*    Microbiology: Results for orders placed or performed during the hospital encounter of 02/19/21  Resp Panel by RT-PCR (Flu A&B, Covid) Nasopharyngeal Swab     Status: None   Collection Time: 02/19/21 11:44 AM   Specimen: Nasopharyngeal Swab; Nasopharyngeal(NP) swabs in vial transport medium  Result Value Ref Range Status   SARS Coronavirus 2 by RT PCR NEGATIVE NEGATIVE Final    Comment: (NOTE) SARS-CoV-2 target nucleic acids are NOT DETECTED.  The SARS-CoV-2 RNA is generally detectable in upper respiratory specimens during the acute phase of infection. The lowest concentration of SARS-CoV-2 viral copies this assay  can detect is 138 copies/mL. A negative result does not preclude SARS-Cov-2 infection and should not be used as the sole basis for treatment or other patient management decisions. A negative result may occur with  improper specimen collection/handling, submission of specimen other than nasopharyngeal swab, presence of viral mutation(s) within the areas targeted by this assay, and inadequate number of viral copies(<138 copies/mL). A negative result must be combined with clinical observations, patient history, and epidemiological information. The expected result is Negative.  Fact Sheet for  Patients:  EntrepreneurPulse.com.au  Fact Sheet for Healthcare Providers:  IncredibleEmployment.be  This test is no t yet approved or cleared by the [place] FDA and  has been authorized for detection and/or diagnosis of SARS-CoV-2 by FDA under an Emergency Use Authorization (EUA). This EUA will remain  in effect (meaning this test can be used) for the duration of the COVID-19 declaration under Section 564(b)(1) of the Act, 21 U.S.C.section 360bbb-3(b)(1), unless the authorization is terminated  or revoked sooner.       Influenza A by PCR NEGATIVE NEGATIVE Final   Influenza B by PCR NEGATIVE NEGATIVE Final    Comment: (NOTE) The Xpert Xpress SARS-CoV-2/FLU/RSV plus assay is intended as an aid in the diagnosis of influenza from Nasopharyngeal swab specimens and should not be used as a sole basis for treatment. Nasal washings and aspirates are unacceptable for Xpert Xpress SARS-CoV-2/FLU/RSV testing.  Fact Sheet for Patients: EntrepreneurPulse.com.au  Fact Sheet for Healthcare Providers: IncredibleEmployment.be  This test is not yet approved or cleared by the [place] FDA and has been authorized for detection and/or diagnosis of SARS-CoV-2 by FDA under an Emergency Use Authorization (EUA). This EUA will remain in effect (meaning this test can be used) for the duration of the COVID-19 declaration under Section 564(b)(1) of the Act, 21 U.S.C. section 360bbb-3(b)(1), unless the authorization is terminated or revoked.  Performed at [facility], [address] [place], [place] [address]     Coagulation Studies: Recent Labs    02/19/21 0850 02/20/21 1801  LABPROT 15.4* 15.6*  INR 1.2 1.2    Urinalysis: No results for input(s): COLORURINE, LABSPEC, PHURINE, GLUCOSEU, HGBUR, BILIRUBINUR, KETONESUR, PROTEINUR, UROBILINOGEN, NITRITE, LEUKOCYTESUR in the last 72 hours.  Invalid  input(s): APPERANCEUR    Imaging: CT HEAD WO CONTRAST  Result Date: 02/19/2021 CLINICAL DATA:  Dizziness over the last 3 days.  Fell yesterday. EXAM: CT HEAD WITHOUT CONTRAST TECHNIQUE: Contiguous axial images were obtained from the base of the skull through the vertex without intravenous contrast. COMPARISON:  08/02/2019 FINDINGS: Brain: Mild age related volume loss. No sign of acute infarction, mass lesion, hemorrhage, hydrocephalus or extra-axial collection. Vascular: There is atherosclerotic calcification of the major vessels at the base of the brain. Skull: Negative Sinuses/Orbits: Mild mucosal thickening in the sphenoid sinus. No advanced finding. Orbits negative. Other: None IMPRESSION: No acute or traumatic finding.  Mild age related volume loss. Electronically Signed   By: Nelson Chimes M.D.   On: 02/19/2021 18:33     Medications:    . amLODipine  5 mg Oral QHS  . aspirin EC  81 mg Oral Daily  . calcitRIOL  0.25 mcg Oral Daily  . calcium acetate  2,001 mg Oral TID WC  . carvedilol  3.125 mg Oral BID WC  . Chlorhexidine Gluconate Cloth  6 each Topical Q0600  . [START ON 02/22/2021] cinacalcet  30 mg Oral Q breakfast  . dextrose  1 ampule Intravenous UD  . furosemide  40 mg  Oral Daily  . gabapentin  300 mg Oral Daily  . heparin injection (subcutaneous)  5,000 Units Subcutaneous Q8H  . insulin aspart  0-5 Units Subcutaneous QHS  . insulin aspart  0-9 Units Subcutaneous TID WC  . insulin aspart protamine- aspart  4 Units Subcutaneous Q supper  . insulin aspart protamine- aspart  5 Units Subcutaneous Q breakfast  . [START ON 02/22/2021] lisinopril  20 mg Oral Daily  . sevelamer carbonate  3,200 mg Oral TID with meals  . traZODone  50 mg Oral QHS   acetaminophen, hydrALAZINE, hydrOXYzine, morphine injection  Assessment/ Plan:  Mr. Sollie Tippery is a 65 y.o.  male with a past medical history of hypertension, diabetes, gout, and end-stage renal disease requiring dialysis.  He  presents to the emergency room with complaints of dizziness and weakness.  He reportedly missed his dialysis treatment yesterday due to not feeling well.  MWF [facility] Left AVF  1. End Stage Renal Disease with hyperkalemia on hemodialysis: on MWF Last hemodialysis 02/15/21. Potassium improved to 5.1 Given Lokelma once Received dialysis yesterday Tolerated well  2. Anemia of chronic kidney disease  Lab Results  Component Value Date   HGB 9.2 (L) 02/20/2021   Hgb below target Will monitor  3. Secondary Hyperparathyroidism:   Lab Results  Component Value Date   PTH 359 (H) 08/01/2019   CALCIUM 7.6 (L) 02/21/2021   PHOS 5.3 (H) 02/20/2021   Decreased calcium levels  Phosphorus elevated at 5.3 Continue Calcium acetate and sevelamer with meals  4 Diabetes mellitus type II with chronic kidney disease  insulin dependent. Home regimen includes Novolog 70/30.  Glucose stable  Primary team will manage SSI     LOS: 2 Jason Frisbee 5/26/202211:21 AM

## 2021-02-21 NOTE — Evaluation (Signed)
Occupational Therapy Evaluation Patient Details Name: Danny [name] MRN: [id number] DOB: [birth year] Today's Date: 02/21/2021    History of Present Illness 65 y.o. male with medical history significant of ESRD-HD (MWF), hypertension, diabetes mellitus, GERD, gout, depression, CHF with EF 35-40%, rectal bleeding, who presents with fall. Patient states that she had dizziness and fell yesterday.  Denies loss of consciousness.  Denies significant injury.  Denies headache or neck pain.  Patient has some bruise to LLQ. Patient states that he had chest pain 2 days ago, which has resolved.   Clinical Impression   Pt was seen for OT evaluation this date. Prior to hospital admission, pt was indep with basic ADL, driving very short distances occasionally, and not using AD for mobility. However, pt's spouse who was present for session endorses pt sometimes walks "like he's drunk." Pt/spouse report ~50 falls in past 36mo2/2 "passing out." Pt and spouse live in a 2 story townhome with no entry steps but full flight to bathroom and bedroom, 1/2 bath on main. Currently pt demonstrates impairments as described below (See OT problem list) which functionally limit his ability to perform ADL/self-care tasks. Pt currently requires CGA for ADL transfers and LB ADL tasks. Pt denied dizziness/lightheadedness throughout. Pt would benefit from skilled OT services to address noted impairments and functional limitations (see below for any additional details) in order to maximize safety and independence while minimizing falls risk and caregiver burden. Do not anticipate skilled OT needs upon discharge pending progress while hospitalized    Follow Up Recommendations  No OT follow up    Equipment Recommendations  None recommended by OT    Recommendations for Other Services       Precautions / Restrictions Precautions Precautions: Fall Precaution Comments: consider doing orthostatics 2/2 hx of ~50 falls/181mo/2 "passing  out" Restrictions Weight Bearing Restrictions: No      Mobility Bed Mobility Overal bed mobility: Needs Assistance Bed Mobility: Supine to Sit     Supine to sit: Supervision     General bed mobility comments: no difficulty, denied dizziness    Transfers Overall transfer level: Needs assistance Equipment used: None Transfers: Sit to/from Stand Sit to Stand: Min guard         General transfer comment: no difficulty, denied dizziness    Balance Overall balance assessment: Needs assistance Sitting-balance support: No upper extremity supported;Feet supported Sitting balance-Leahy Scale: Good     Standing balance support: No upper extremity supported Standing balance-Leahy Scale: Fair Standing balance comment: able to march in place without significant LOB, mildly unsteady                           ADL either performed or assessed with clinical judgement   ADL Overall ADL's : Needs assistance/impaired                                       General ADL Comments: supervision to CGAvocaor LB ADL tasks for safety, CGA for ADL transfers     Vision Patient Visual Report: No change from baseline       Perception     Praxis      Pertinent Vitals/Pain Pain Assessment: No/denies pain     Hand Dominance Right   Extremity/Trunk Assessment Upper Extremity Assessment Upper Extremity Assessment: Overall WFL for tasks assessed   Lower Extremity Assessment Lower Extremity Assessment:  Overall [facility] for tasks assessed       Communication Communication Communication: Interpreter utilized Acupuncturist, [place] interpreter)   Cognition Arousal/Alertness: Awake/alert Behavior During Therapy: WFL for tasks assessed/performed Overall Cognitive Status: Within Functional Limits for tasks assessed                                     General Comments       Exercises     Shoulder Instructions      Home Living Family/patient  expects to be discharged to:: Private residence Living Arrangements: Spouse/significant other Available Help at Discharge: Family Type of Home: Other(Comment) (townhouse) Home Access: Level entry     Home Layout: Two level;1/2 bath on main level;Bed/bath upstairs Alternate Level Stairs-Number of Steps: 12   Bathroom Shower/Tub: Teacher, early years/pre: Standard     Home Equipment: None          Prior Functioning/Environment Level of Independence: Needs assistance  Gait / Transfers Assistance Needed: Pt/spouse report pt ambulates without AD but wife endorses pt sometimes "walks like he's drunk" ADL's / Homemaking Assistance Needed: Pt endorses being indep with ADL, drives very short distances   Comments: Pt/spouse report ~50 falls in 17mo2/2 "passing out"        OT Problem List: Impaired balance (sitting and/or standing);Decreased knowledge of use of DME or AE      OT Treatment/Interventions: Self-care/ADL training;Therapeutic exercise;Therapeutic activities;DME and/or AE instruction;Patient/family education;Balance training    OT Goals(Current goals can be found in the care plan section) Acute Rehab OT Goals Patient Stated Goal: feel better and not get weaker OT Goal Formulation: With patient/family Time For Goal Achievement: 03/07/21 Potential to Achieve Goals: Good ADL Goals Pt Will Transfer to Toilet: with modified independence;ambulating;regular height toilet Additional ADL Goal #1: Pt will perform daily morning ADL routine with modified independence utilizing learned falls prevention strategies without cues to maximize safety. Additional ADL Goal #2: Pt will verbalize plan to implement at least 2 learned falls prevention strategies.  OT Frequency: Min 1X/week   Barriers to D/C:            Co-evaluation              AM-PAC OT "6 Clicks" Daily Activity     Outcome Measure Help from another person eating meals?: None Help from another person  taking care of personal grooming?: None Help from another person toileting, which includes using toliet, bedpan, or urinal?: A Little Help from another person bathing (including washing, rinsing, drying)?: A Little Help from another person to put on and taking off regular upper body clothing?: None Help from another person to put on and taking off regular lower body clothing?: A Little 6 Click Score: 21   End of Session Nurse Communication: Mobility status  Activity Tolerance: Patient tolerated treatment well Patient left: in chair;with call bell/phone within reach;with chair alarm set;with family/visitor present  OT Visit Diagnosis: Other abnormalities of gait and mobility (R26.89);Repeated falls (R29.6)                Time: [id number] Time Calculation (min): 16 min Charges:  OT General Charges $OT Visit: 1 Visit OT Evaluation $OT Eval Low Complexity: 1 Low  JHanley Hays MPH, MS, OTR/L ascom [phone]/26/22, 10:15 AM

## 2021-02-21 NOTE — Plan of Care (Signed)
Problem: Education: Goal: Knowledge of disease and its progression will improve 02/21/2021 1241 by Alen Blew, RN Outcome: Adequate for Discharge 02/21/2021 1241 by Alen Blew, RN Outcome: Adequate for Discharge Goal: Individualized Educational Video(s) 02/21/2021 1241 by Alen Blew, RN Outcome: Adequate for Discharge 02/21/2021 1241 by Alen Blew, RN Outcome: Adequate for Discharge   Problem: Fluid Volume: Goal: Compliance with measures to maintain balanced fluid volume will improve 02/21/2021 1241 by Alen Blew, RN Outcome: Adequate for Discharge 02/21/2021 1241 by Alen Blew, RN Outcome: Adequate for Discharge   Problem: Health Behavior/Discharge Planning: Goal: Ability to manage health-related needs will improve 02/21/2021 1241 by Alen Blew, RN Outcome: Adequate for Discharge 02/21/2021 1241 by Alen Blew, RN Outcome: Adequate for Discharge   Problem: Nutritional: Goal: Ability to make healthy dietary choices will improve 02/21/2021 1241 by Roanna Epley D, RN Outcome: Adequate for Discharge 02/21/2021 1241 by Alen Blew, RN Outcome: Adequate for Discharge   Problem: Clinical Measurements: Goal: Complications related to the disease process, condition or treatment will be avoided or minimized 02/21/2021 1241 by Roanna Epley D, RN Outcome: Adequate for Discharge 02/21/2021 1241 by Alen Blew, RN Outcome: Adequate for Discharge   Problem: Clinical Measurements: Goal: Complications related to the disease process, condition or treatment will be avoided or minimized 02/21/2021 1241 by Alen Blew, RN Outcome: Adequate for Discharge 02/21/2021 1241 by Alen Blew, RN Outcome: Adequate for Discharge   Problem: Education: Goal: Knowledge of General Education information will improve Description: Including pain rating scale, medication(s)/side effects and non-pharmacologic comfort measures 02/21/2021 1241 by  Alen Blew, RN Outcome: Adequate for Discharge 02/21/2021 1241 by Alen Blew, RN Outcome: Adequate for Discharge   Problem: Health Behavior/Discharge Planning: Goal: Ability to manage health-related needs will improve 02/21/2021 1241 by Alen Blew, RN Outcome: Adequate for Discharge 02/21/2021 1241 by Alen Blew, RN Outcome: Adequate for Discharge   Problem: Clinical Measurements: Goal: Ability to maintain clinical measurements within normal limits will improve 02/21/2021 1241 by Alen Blew, RN Outcome: Adequate for Discharge 02/21/2021 1241 by Alen Blew, RN Outcome: Adequate for Discharge Goal: Will remain free from infection 02/21/2021 1241 by Alen Blew, RN Outcome: Adequate for Discharge 02/21/2021 1241 by Alen Blew, RN Outcome: Adequate for Discharge Goal: Diagnostic test results will improve 02/21/2021 1241 by Alen Blew, RN Outcome: Adequate for Discharge 02/21/2021 1241 by Alen Blew, RN Outcome: Adequate for Discharge Goal: Respiratory complications will improve 02/21/2021 1241 by Alen Blew, RN Outcome: Adequate for Discharge 02/21/2021 1241 by Alen Blew, RN Outcome: Adequate for Discharge Goal: Cardiovascular complication will be avoided 02/21/2021 1241 by Alen Blew, RN Outcome: Adequate for Discharge 02/21/2021 1241 by Alen Blew, RN Outcome: Adequate for Discharge   Problem: Activity: Goal: Risk for activity intolerance will decrease 02/21/2021 1241 by Alen Blew, RN Outcome: Adequate for Discharge 02/21/2021 1241 by Alen Blew, RN Outcome: Adequate for Discharge   Problem: Nutrition: Goal: Adequate nutrition will be maintained 02/21/2021 1241 by Alen Blew, RN Outcome: Adequate for Discharge 02/21/2021 1241 by Alen Blew, RN Outcome: Adequate for Discharge   Problem: Coping: Goal: Level of anxiety will decrease 02/21/2021 1241 by Alen Blew,  RN Outcome: Adequate for Discharge 02/21/2021 1241 by Alen Blew, RN Outcome: Adequate for Discharge   Problem: Elimination: Goal: Will not experience complications related to bowel motility 02/21/2021 1241 by Alen Blew,  RN Outcome: Adequate for Discharge 02/21/2021 1241 by Alen Blew, RN Outcome: Adequate for Discharge Goal: Will not experience complications related to urinary retention 02/21/2021 1241 by Alen Blew, RN Outcome: Adequate for Discharge 02/21/2021 1241 by Alen Blew, RN Outcome: Adequate for Discharge   Problem: Pain Managment: Goal: General experience of comfort will improve 02/21/2021 1241 by Alen Blew, RN Outcome: Adequate for Discharge 02/21/2021 1241 by Alen Blew, RN Outcome: Adequate for Discharge   Problem: Safety: Goal: Ability to remain free from injury will improve 02/21/2021 1241 by Alen Blew, RN Outcome: Adequate for Discharge 02/21/2021 1241 by Alen Blew, RN Outcome: Adequate for Discharge   Problem: Skin Integrity: Goal: Risk for impaired skin integrity will decrease 02/21/2021 1241 by Alen Blew, RN Outcome: Adequate for Discharge 02/21/2021 1241 by Alen Blew, RN Outcome: Adequate for Discharge

## 2021-02-21 NOTE — Discharge Summary (Signed)
Danny [name] [id number] DOB: [birth year] DOA: 02/19/2021  PCP: Patient, No Pcp Per (Inactive)  Admit date: 02/19/2021 Discharge date: 02/21/2021  Time spent: 35 minutes  Recommendations for Outpatient Follow-up:  1. Continue mwf dialysis 2. Will need frequent monitoring of potassium 3. pcp f/u 1-2 weeks     Discharge Diagnoses:  Principal Problem:   Hyperkalemia Active Problems:   Hypertension   Depression   Abdominal pain   Elevated troponin   Fall at home, initial encounter   Type II diabetes mellitus with renal manifestations ([place])   ESRD (end stage renal disease) on dialysis ([place])   Chronic systolic CHF (congestive heart failure) ([place])   Discharge Condition: fair  Diet recommendation: low sodium  Filed Weights   02/19/21 0837 02/19/21 1720 02/21/21 0500  Weight: 96 kg 94.1 kg 94 kg    History of present illness:  Danny [name] is a 65 y.o. male with medical history significant of ESRD-HD (MWF), hypertension, diabetes mellitus, GERD, gout, depression, CHF with EF 35-40%, rectal bleeding, who presents with fall.  Patient states that she had dizziness and fell yesterday.  Denies loss of consciousness.  Denies significant injury.  Denies headache or neck pain.  Patient has some bruise to LLQ. Patient states that he had chest pain 2 days ago, which has resolved.  Patient has mild cough and mild shortness of breath currently, denies fever or chills.  Patient also had nausea and mild abdominal pain yesterday, which has resolved.  Currently patient does not have nausea, vomiting, diarrhea or abdominal pain.  No symptoms of UTI.  Patient denies unilateral numbness or tingling in extremities.  No facial droop or slurred speech.  Patient missed dialysis yesterday due to not feeling well.   Hospital Course:  HyperkalemiaandESRD (end stage renal disease) on dialysis(MWF): K 7.3. HR 69, in setting of missed dialysis. Received dialysis 5/24 and 5/25. Hyperkalemia resolved. -  resume mwf dialysis - lokelma added - lisinopril decreased to 10 - will need frequent potassium checks at dialysis  Fall at home, initial encounter -ct head, abd/pelvis neg - pt ot consulted, no home needs  Type II diabetes mellitus with renal manifestations (HCC):Recent A1c 6.0, well controlled. Patient is taking 70/30 insulin at home -Decreased 70/30 insulin dose from 10-8 unit to 5-4 units twice daily  Chronic systolic CHF (congestive heart failure) (HCC):2D echo on 05/29/2019 showed EF 35-40%. Chest x-ray showed cardiomegaly, vascular congestion and mild interstitial edema. Asymptomatic -Volume management per renal by dialysis  Procedures:  dialysis   Consultations:  nephrology  Discharge Exam: Vitals:   02/21/21 0400 02/21/21 1051  BP: (!) 149/78 (!) 142/71  Pulse: 80 70  Resp: 18 18  Temp: 98.1 F (36.7 C) 97.7 F (36.5 C)  SpO2: 94% 99%    General exam: Appears calm and comfortable  Respiratory system: rales @ bases Cardiovascular system: S1 & S2 heard, RRR. No JVD, murmurs, rubs, gallops or clicks. Trace pedal edema Gastrointestinal system: Abdomen is nondistended, soft and nontender. No organomegaly or masses felt. Normal bowel sounds heard. Central nervous system: Alert and oriented. No focal neurological deficits. Extremities: Symmetric 5 x 5 power. Skin: No rashes, lesions or ulcers Psychiatry: Judgement and insight appear normal. Mood & affect appropriate.  Discharge Instructions   Discharge Instructions    Diet - low sodium heart healthy   Complete by: As directed    Increase activity slowly   Complete by: As directed      Allergies as of 02/21/2021   No  Known Allergies     Medication List    STOP taking these medications   amoxicillin-clavulanate 875-125 MG tablet Commonly known as: Augmentin   sacubitril-valsartan 24-26 MG Commonly known as: ENTRESTO     TAKE these medications   albuterol 108 (90 Base) MCG/ACT inhaler Commonly  known as: VENTOLIN HFA Inhale 2 puffs into the lungs every 6 (six) hours as needed for wheezing.   amLODipine 5 MG tablet Commonly known as: NORVASC Take 5 mg by mouth at bedtime.   aspirin 81 MG EC tablet Take 1 tablet by mouth daily.   calcitRIOL 0.25 MCG capsule Commonly known as: ROCALTROL Take 1 capsule by mouth daily.   calcium acetate 667 MG capsule Commonly known as: PHOSLO Take 2,001 mg by mouth 3 (three) times daily with meals.   carvedilol 3.125 MG tablet Commonly known as: COREG Take 3.125 mg by mouth 2 (two) times daily with a meal. What changed: Another medication with the same name was removed. Continue taking this medication, and follow the directions you see here.   cinacalcet 30 MG tablet Commonly known as: SENSIPAR Take 1 tablet by mouth daily.   citalopram 20 MG tablet Commonly known as: CELEXA Take 20 mg by mouth daily.   feeding supplement (NEPRO CARB STEADY) Liqd Take 237 mLs by mouth 2 (two) times daily between meals.   furosemide 40 MG tablet Commonly known as: LASIX Take 1 tablet by mouth daily.   gabapentin 300 MG capsule Commonly known as: NEURONTIN Take 300 mg by mouth daily.   insulin aspart protamine- aspart (70-30) 100 UNIT/ML injection Commonly known as: NOVOLOG MIX 70/30 Inject 0.05-0.1 mLs (5-10 Units total) into the skin See admin instructions. 5 units every morning and 4 units every evening What changed: additional instructions   lisinopril 20 MG tablet Commonly known as: ZESTRIL Take 0.5 tablets (10 mg total) by mouth daily. What changed: how much to take   Lokelma 5 g packet Generic drug: sodium zirconium cyclosilicate Take 5 g by mouth daily.   loperamide 2 MG capsule Commonly known as: IMODIUM Take 1 capsule (2 mg total) by mouth every 6 (six) hours as needed for diarrhea or loose stools.   multivitamin Tabs tablet Take 1 tablet by mouth at bedtime.   omeprazole 20 MG tablet Commonly known as: PRILOSEC OTC Take  20 mg by mouth daily.   sevelamer carbonate 800 MG tablet Commonly known as: RENVELA Take 800-3,200 mg by mouth See admin instructions. Take 4 tablets ('3200mg'$ ) by mouth three times daily with meals   traZODone 50 MG tablet Commonly known as: DESYREL Take 1 tablet by mouth at bedtime.      No Known Allergies  Follow-up Information    Your dialysis provider Follow up.   Why: as scheduled               The results of significant diagnostics from this hospitalization (including imaging, microbiology, ancillary and laboratory) are listed below for reference.    Significant Diagnostic Studies: CT ABDOMEN PELVIS WO CONTRAST  Result Date: 02/19/2021 CLINICAL DATA:  Left flank bruising after multiple falls. EXAM: CT ABDOMEN AND PELVIS WITHOUT CONTRAST TECHNIQUE: Multidetector CT imaging of the abdomen and pelvis was performed following the standard protocol without IV contrast. COMPARISON:  July 27, 2018. FINDINGS: Lower chest: No acute abnormality. Hepatobiliary: Minimal cholelithiasis is noted. Probable mild gallbladder wall thickening is noted with mild inflammatory changes suggesting possible cholecystitis. No biliary dilatation is noted. Small amount of fluid is noted around  the liver. The liver is otherwise unremarkable. Pancreas: Unremarkable. No pancreatic ductal dilatation or surrounding inflammatory changes. Spleen: Normal in size without focal abnormality. Adrenals/Urinary Tract: Adrenal glands appear normal. Bilateral renal atrophy is noted consistent with history of end-stage renal disease. No hydronephrosis or renal obstruction is noted. Urinary bladder is decompressed. Stomach/Bowel: Stomach is within normal limits. Appendix appears normal. No evidence of bowel wall thickening, distention, or inflammatory changes. Vascular/Lymphatic: Aortic atherosclerosis. No enlarged abdominal or pelvic lymph nodes. Reproductive: Prostate is unremarkable. Other: No hernia is noted. Minimal to  mild ascites is noted in the pelvis as well as around the liver and spleen. Musculoskeletal: No acute or significant osseous findings. IMPRESSION: Minimal cholelithiasis. Probable mild gallbladder wall thickening is noted with mild surrounding inflammatory changes suggesting possible cholecystitis. Ultrasound is recommended for further evaluation. Minimal to mild ascites is noted. Aortic Atherosclerosis (ICD10-I70.0). Electronically Signed   By: Marijo Conception M.D.   On: 02/19/2021 10:21   CT HEAD WO CONTRAST  Result Date: 02/19/2021 CLINICAL DATA:  Dizziness over the last 3 days.  Fell yesterday. EXAM: CT HEAD WITHOUT CONTRAST TECHNIQUE: Contiguous axial images were obtained from the base of the skull through the vertex without intravenous contrast. COMPARISON:  08/02/2019 FINDINGS: Brain: Mild age related volume loss. No sign of acute infarction, mass lesion, hemorrhage, hydrocephalus or extra-axial collection. Vascular: There is atherosclerotic calcification of the major vessels at the base of the brain. Skull: Negative Sinuses/Orbits: Mild mucosal thickening in the sphenoid sinus. No advanced finding. Orbits negative. Other: None IMPRESSION: No acute or traumatic finding.  Mild age related volume loss. Electronically Signed   By: Nelson Chimes M.D.   On: 02/19/2021 18:33   DG Chest Port 1 View  Result Date: 02/19/2021 CLINICAL DATA:  Dizziness.  Falls.  Dialysis patient. EXAM: PORTABLE CHEST 1 VIEW COMPARISON:  Chest x-ray 11/23/2019. FINDINGS: Cardiomegaly with pulmonary venous congestion and bilateral interstitial prominence consistent CHF. Low lung volumes. No pleural effusion or pneumothorax. IMPRESSION: Cardiomegaly with pulmonary venous congestion and bilateral interstitial prominence consistent with CHF. Low lung volumes. Electronically Signed   By: Marcello Moores  Register   On: 02/19/2021 09:41    Microbiology: Recent Results (from the past 240 hour(s))  Resp Panel by RT-PCR (Flu A&B, Covid)  Nasopharyngeal Swab     Status: None   Collection Time: 02/19/21 11:44 AM   Specimen: Nasopharyngeal Swab; Nasopharyngeal(NP) swabs in vial transport medium  Result Value Ref Range Status   SARS Coronavirus 2 by RT PCR NEGATIVE NEGATIVE Final    Comment: (NOTE) SARS-CoV-2 target nucleic acids are NOT DETECTED.  The SARS-CoV-2 RNA is generally detectable in upper respiratory specimens during the acute phase of infection. The lowest concentration of SARS-CoV-2 viral copies this assay can detect is 138 copies/mL. A negative result does not preclude SARS-Cov-2 infection and should not be used as the sole basis for treatment or other patient management decisions. A negative result may occur with  improper specimen collection/handling, submission of specimen other than nasopharyngeal swab, presence of viral mutation(s) within the areas targeted by this assay, and inadequate number of viral copies(<138 copies/mL). A negative result must be combined with clinical observations, patient history, and epidemiological information. The expected result is Negative.  Fact Sheet for Patients:  EntrepreneurPulse.com.au  Fact Sheet for Healthcare Providers:  IncredibleEmployment.be  This test is no t yet approved or cleared by the [place] FDA and  has been authorized for detection and/or diagnosis of SARS-CoV-2 by FDA under an Emergency Use Authorization (  EUA). This EUA will remain  in effect (meaning this test can be used) for the duration of the COVID-19 declaration under Section 564(b)(1) of the Act, 21 U.S.C.section 360bbb-3(b)(1), unless the authorization is terminated  or revoked sooner.       Influenza A by PCR NEGATIVE NEGATIVE Final   Influenza B by PCR NEGATIVE NEGATIVE Final    Comment: (NOTE) The Xpert Xpress SARS-CoV-2/FLU/RSV plus assay is intended as an aid in the diagnosis of influenza from Nasopharyngeal swab specimens and should not be  used as a sole basis for treatment. Nasal washings and aspirates are unacceptable for Xpert Xpress SARS-CoV-2/FLU/RSV testing.  Fact Sheet for Patients: EntrepreneurPulse.com.au  Fact Sheet for Healthcare Providers: IncredibleEmployment.be  This test is not yet approved or cleared by the [place] FDA and has been authorized for detection and/or diagnosis of SARS-CoV-2 by FDA under an Emergency Use Authorization (EUA). This EUA will remain in effect (meaning this test can be used) for the duration of the COVID-19 declaration under Section 564(b)(1) of the Act, 21 U.S.C. section 360bbb-3(b)(1), unless the authorization is terminated or revoked.  Performed at [facility], [address] [place], [place] [address]      Labs: Basic Metabolic Panel: Recent Labs  Lab 02/19/21 0850 02/20/21 0746 02/20/21 1454 02/20/21 1801 02/21/21 0700  NA 133* 134* 136 137 137  K 7.3* 6.2* 4.5 4.8 5.1  CL 91* 95* 96* 96* 98  CO2 '24 27 28 29 27  '$ GLUCOSE 101* 83 223* 228* 87  BUN 91* 58* 31* 39* 45*  CREATININE 8.63* 6.16* 4.05* 4.63* 5.40*  CALCIUM 7.3* 7.4* 7.9* 7.7* 7.6*  PHOS  --  5.3*  --   --   --    Liver Function Tests: Recent Labs  Lab 02/19/21 0850  AST 16  ALT 13  ALKPHOS 118  BILITOT 0.9  PROT 7.7  ALBUMIN 3.9   Recent Labs  Lab 02/19/21 0850  LIPASE 43   No results for input(s): AMMONIA in the last 168 hours. CBC: Recent Labs  Lab 02/19/21 0850 02/20/21 1801  WBC 6.8 3.7*  HGB 11.0* 9.2*  HCT 33.0* 28.0*  MCV 97.3 98.6  PLT 140* 116*   Cardiac Enzymes: No results for input(s): CKTOTAL, CKMB, CKMBINDEX, TROPONINI in the last 168 hours. BNP: BNP (last 3 results) No results for input(s): BNP in the last 8760 hours.  ProBNP (last 3 results) No results for input(s): PROBNP in the last 8760 hours.  CBG: Recent Labs  Lab 02/19/21 2116 02/20/21 0832 02/20/21 1610 02/20/21 2058 02/21/21 0842  GLUCAP  219* 87 218* 147* 85       Signed:  Desma Maxim MD.  [facility] 02/21/2021, 11:15 AM

## 2021-02-24 DIAGNOSIS — F329 Major depressive disorder, single episode, unspecified: Principal | ICD-10-CM

## 2021-02-24 MED ORDER — TRAZODONE 50 MG TABLET
ORAL_TABLET | Freq: Every evening | ORAL | 3 refills | 90.00000 days
Start: 2021-02-24 — End: 2021-03-26

## 2021-03-11 ENCOUNTER — Ambulatory Visit: Admit: 2021-03-11 | Attending: Ophthalmology | Primary: Ophthalmology

## 2021-03-14 ENCOUNTER — Ambulatory Visit
Admit: 2021-03-14 | Attending: Student in an Organized Health Care Education/Training Program | Primary: Student in an Organized Health Care Education/Training Program

## 2021-03-25 IMAGING — CR DG SHOULDER 2+V*L*
2 series · 2 of 2 positions shown · non-contrast
Comparison: None.

CLINICAL DATA: Left shoulder pain. No known injury.

EXAM:
LEFT SHOULDER - 2+ VIEW

[shoulder grashey]
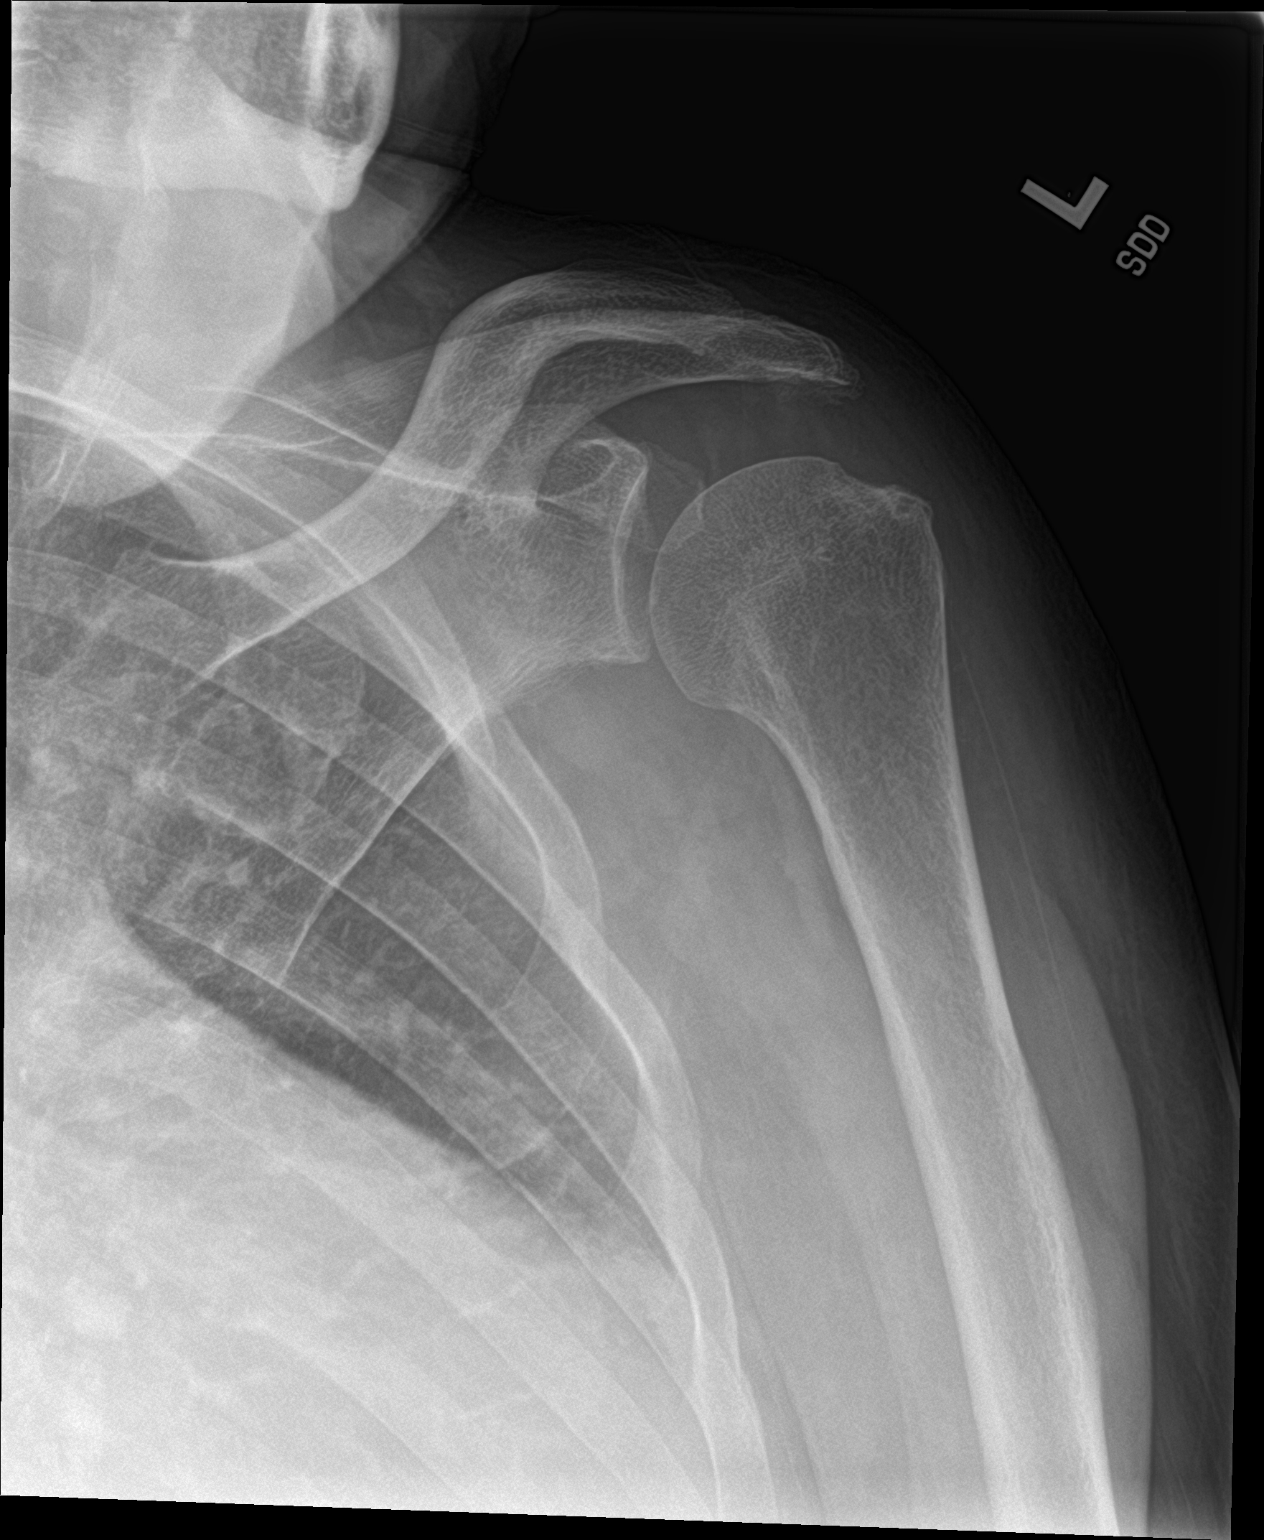

[shoulder y view]
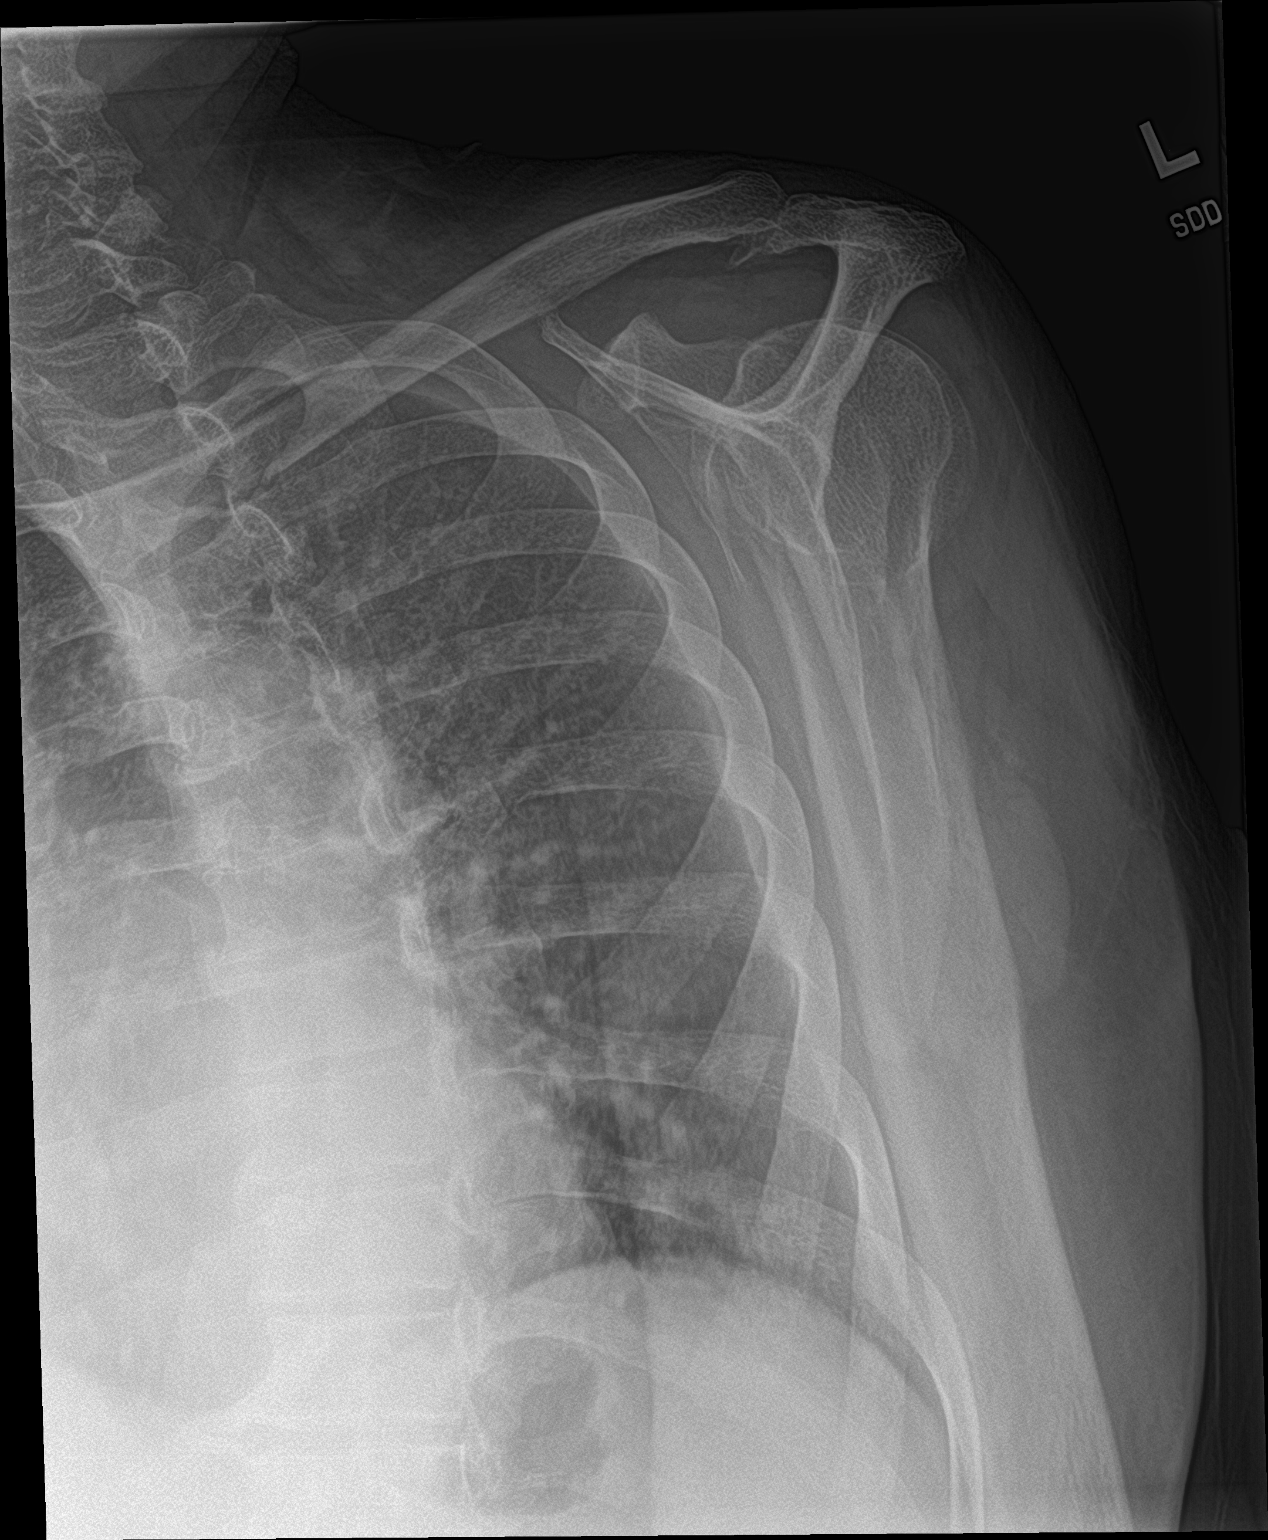

[2 of 2 positions shown; findings below may reference images not displayed]

FINDINGS: There is no evidence of fracture or dislocation. Acromioclavicular
spurring with inferiorly directed osteophytes. Small subacromial
spur. Mild subcortical cystic change in the lateral humeral head can
be seen with underlying rotator cuff arthropathy. No evidence of
focal bone lesion. Soft tissues are unremarkable.
IMPRESSION: 1. No acute abnormality.
2. Mild acromioclavicular osteoarthritis with inferiorly directed
osteophytes. Small subacromial spur. Mild subcortical cystic change
in the lateral humeral head can be seen with underlying rotator cuff
arthropathy.

## 2021-04-11 MED ORDER — CINACALCET 30 MG TABLET
ORAL_TABLET | Freq: Every day | ORAL | 3 refills | 90 days | Status: CN
Start: 2021-04-11 — End: ?

## 2021-05-02 ENCOUNTER — Ambulatory Visit: Admit: 2021-05-02

## 2021-05-20 DIAGNOSIS — F329 Major depressive disorder, single episode, unspecified: Principal | ICD-10-CM

## 2021-05-20 MED ORDER — TRAZODONE 50 MG TABLET
ORAL_TABLET | Freq: Every evening | ORAL | 3 refills | 90 days
Start: 2021-05-20 — End: 2021-06-19

## 2021-05-20 MED ORDER — CINACALCET 30 MG TABLET
ORAL_TABLET | Freq: Every day | ORAL | 3 refills | 90.00000 days
Start: 2021-05-20 — End: ?

## 2021-05-20 MED ORDER — CINACALCET 30 MG TABLET: ORAL | 3 refills | 90 days

## 2021-05-21 MED FILL — NOVOLOG MIX 70-30 FLEXPEN U-100 INSULIN 100 UNIT/ML SUBCUTANEOUS PEN: SUBCUTANEOUS | 100 days supply | Qty: 15 | Fill #2

## 2021-05-21 MED FILL — SEVELAMER CARBONATE 800 MG TABLET: ORAL | 30 days supply | Qty: 360 | Fill #6

## 2021-05-21 MED FILL — CITALOPRAM 20 MG TABLET: ORAL | 30 days supply | Qty: 30 | Fill #3

## 2021-05-21 MED FILL — CARVEDILOL 3.125 MG TABLET: ORAL | 30 days supply | Qty: 60 | Fill #1

## 2021-05-21 MED FILL — GABAPENTIN 300 MG CAPSULE: ORAL | 30 days supply | Qty: 30 | Fill #3

## 2021-05-21 MED FILL — AMLODIPINE 5 MG TABLET: ORAL | 90 days supply | Qty: 90 | Fill #1

## 2021-05-22 DIAGNOSIS — F329 Major depressive disorder, single episode, unspecified: Principal | ICD-10-CM

## 2021-05-22 MED ORDER — TRAZODONE 50 MG TABLET: ORAL | 3 refills | 90 days

## 2021-05-22 MED ORDER — TRAZODONE 50 MG TABLET
ORAL_TABLET | Freq: Every evening | ORAL | 3 refills | 90.00000 days
Start: 2021-05-22 — End: 2021-06-21

## 2021-05-24 DIAGNOSIS — F329 Major depressive disorder, single episode, unspecified: Principal | ICD-10-CM

## 2021-05-25 MED ORDER — TRAZODONE 50 MG TABLET
ORAL_TABLET | Freq: Every evening | ORAL | 3 refills | 90 days | Status: CP
Start: 2021-05-25 — End: 2022-05-20
  Filled 2021-05-27: qty 90, 90d supply, fill #0

## 2021-06-16 MED ORDER — SEVELAMER CARBONATE 800 MG TABLET
ORAL_TABLET | Freq: Three times a day (TID) | ORAL | 5 refills | 45.00000 days | Status: CN
Start: 2021-06-16 — End: ?

## 2021-09-09 ENCOUNTER — Ambulatory Visit: Admit: 2021-09-09 | Discharge: 2021-09-10

## 2021-09-09 DIAGNOSIS — I10 Essential (primary) hypertension: Principal | ICD-10-CM

## 2021-09-09 DIAGNOSIS — N186 End stage renal disease: Principal | ICD-10-CM

## 2021-09-09 DIAGNOSIS — R42 Dizziness and giddiness: Principal | ICD-10-CM

## 2021-09-09 DIAGNOSIS — Z992 Dependence on renal dialysis: Principal | ICD-10-CM

## 2021-09-09 MED ORDER — INSULIN ASPAR PROT-INSULIN ASPART 100 UNIT/ML (70-30) SUBCUTANEOUS PEN
Freq: Every day | SUBCUTANEOUS | 3 refills | 100.00000 days
Start: 2021-09-09 — End: 2022-09-09

## 2021-09-09 MED ORDER — CITALOPRAM 20 MG TABLET
ORAL_TABLET | Freq: Every day | ORAL | 3 refills | 90 days
Start: 2021-09-09 — End: 2022-09-09

## 2021-09-09 MED ORDER — CARVEDILOL 3.125 MG TABLET
ORAL_TABLET | Freq: Two times a day (BID) | ORAL | 3 refills | 45 days | Status: CP
Start: 2021-09-09 — End: ?
  Filled 2021-09-13: qty 180, 45d supply, fill #0

## 2021-09-09 MED ORDER — SEVELAMER CARBONATE 800 MG TABLET
ORAL_TABLET | 11 refills | 0 days | Status: CP
Start: 2021-09-09 — End: 2022-09-09
  Filled 2021-09-13: qty 360, 30d supply, fill #0

## 2021-09-11 MED ORDER — CINACALCET 30 MG TABLET
ORAL_TABLET | Freq: Every day | ORAL | 3 refills | 90 days
Start: 2021-09-11 — End: ?

## 2021-09-12 MED ORDER — CITALOPRAM 20 MG TABLET
ORAL_TABLET | Freq: Every day | ORAL | 3 refills | 90 days
Start: 2021-09-12 — End: 2022-09-12

## 2021-09-13 MED ORDER — INSULIN ASPAR PROT-INSULIN ASPART 100 UNIT/ML (70-30) SUBCUTANEOUS PEN
Freq: Every day | SUBCUTANEOUS | 3 refills | 100 days | Status: CP
Start: 2021-09-13 — End: 2022-09-13
  Filled 2021-09-16: qty 15, 100d supply, fill #0

## 2021-09-13 MED FILL — GABAPENTIN 300 MG CAPSULE: ORAL | 30 days supply | Qty: 30 | Fill #4

## 2021-09-13 MED FILL — TRAZODONE 50 MG TABLET: ORAL | 90 days supply | Qty: 90 | Fill #1

## 2021-09-13 MED FILL — AMLODIPINE 5 MG TABLET: ORAL | 90 days supply | Qty: 90 | Fill #2

## 2021-09-13 MED FILL — CINACALCET 30 MG TABLET: ORAL | 30 days supply | Qty: 30 | Fill #0

## 2021-09-25 ENCOUNTER — Ambulatory Visit: Admit: 2021-09-25

## 2021-10-08 MED FILL — CINACALCET 30 MG TABLET: ORAL | 30 days supply | Qty: 30 | Fill #1

## 2021-10-08 MED FILL — SEVELAMER CARBONATE 800 MG TABLET: 30 days supply | Qty: 360 | Fill #1

## 2021-10-08 MED FILL — GABAPENTIN 300 MG CAPSULE: ORAL | 30 days supply | Qty: 30 | Fill #5

## 2021-10-15 MED ORDER — CINACALCET 60 MG TABLET
ORAL_TABLET | Freq: Every day | ORAL | 3 refills | 90 days
Start: 2021-10-15 — End: ?

## 2021-10-22 ENCOUNTER — Ambulatory Visit: Admit: 2021-10-22 | Attending: Pharmacist | Primary: Pharmacist

## 2021-10-22 ENCOUNTER — Ambulatory Visit: Admit: 2021-10-22

## 2021-10-23 ENCOUNTER — Ambulatory Visit: Admit: 2021-10-23

## 2021-10-30 MED FILL — CINACALCET 60 MG TABLET: ORAL | 30 days supply | Qty: 30 | Fill #0

## 2021-10-30 MED FILL — CARVEDILOL 3.125 MG TABLET: ORAL | 45 days supply | Qty: 180 | Fill #1

## 2022-01-17 MED ORDER — GABAPENTIN 300 MG CAPSULE
ORAL_CAPSULE | Freq: Every day | ORAL | 11 refills | 30 days
Start: 2022-01-17 — End: ?

## 2022-01-17 MED ORDER — AMLODIPINE 5 MG TABLET
ORAL_TABLET | Freq: Every evening | ORAL | 3 refills | 90 days
Start: 2022-01-17 — End: ?

## 2022-01-20 MED FILL — CINACALCET 30 MG TABLET: ORAL | 30 days supply | Qty: 30 | Fill #2

## 2022-01-20 MED FILL — SEVELAMER CARBONATE 800 MG TABLET: 30 days supply | Qty: 360 | Fill #2

## 2022-01-20 MED FILL — CARVEDILOL 3.125 MG TABLET: ORAL | 45 days supply | Qty: 180 | Fill #2

## 2022-01-20 MED FILL — NOVOLOG MIX 70-30 FLEXPEN U-100 INSULIN 100 UNIT/ML SUBCUTANEOUS PEN: SUBCUTANEOUS | 100 days supply | Qty: 15 | Fill #1

## 2022-01-20 MED FILL — CINACALCET 60 MG TABLET: ORAL | 30 days supply | Qty: 30 | Fill #1

## 2022-01-20 MED FILL — TRAZODONE 50 MG TABLET: ORAL | 90 days supply | Qty: 90 | Fill #2

## 2022-01-22 MED ORDER — AMLODIPINE 5 MG TABLET
ORAL_TABLET | Freq: Every evening | ORAL | 3 refills | 90 days
Start: 2022-01-22 — End: ?

## 2022-01-27 MED ORDER — CINACALCET 60 MG TABLET
ORAL_TABLET | Freq: Every day | ORAL | 3 refills | 90 days | Status: CP
Start: 2022-01-27 — End: ?
  Filled 2022-03-11: qty 30, 30d supply, fill #0

## 2022-01-30 ENCOUNTER — Emergency Department
Admission: EM | Admit: 2022-01-30 | Discharge: 2022-01-30 | Disposition: A | Discharge status: Home / Self Care | Payer: Self-pay | Attending: Emergency Medicine | Admitting: Emergency Medicine

## 2022-01-30 ENCOUNTER — Emergency Department: Payer: Self-pay

## 2022-01-30 ENCOUNTER — Encounter: Payer: Self-pay | Admitting: Emergency Medicine

## 2022-01-30 ENCOUNTER — Other Ambulatory Visit: Payer: Self-pay

## 2022-01-30 DIAGNOSIS — S20212A Contusion of left front wall of thorax, initial encounter: Secondary | ICD-10-CM | POA: Insufficient documentation

## 2022-01-30 DIAGNOSIS — W19XXXA Unspecified fall, initial encounter: Secondary | ICD-10-CM

## 2022-01-30 DIAGNOSIS — N186 End stage renal disease: Secondary | ICD-10-CM | POA: Insufficient documentation

## 2022-01-30 DIAGNOSIS — S0083XA Contusion of other part of head, initial encounter: Secondary | ICD-10-CM | POA: Insufficient documentation

## 2022-01-30 DIAGNOSIS — R109 Unspecified abdominal pain: Secondary | ICD-10-CM | POA: Insufficient documentation

## 2022-01-30 DIAGNOSIS — Z992 Dependence on renal dialysis: Secondary | ICD-10-CM | POA: Insufficient documentation

## 2022-01-30 DIAGNOSIS — W01198A Fall on same level from slipping, tripping and stumbling with subsequent striking against other object, initial encounter: Secondary | ICD-10-CM | POA: Insufficient documentation

## 2022-01-30 DIAGNOSIS — I132 Hypertensive heart and chronic kidney disease with heart failure and with stage 5 chronic kidney disease, or end stage renal disease: Secondary | ICD-10-CM | POA: Insufficient documentation

## 2022-01-30 DIAGNOSIS — E1122 Type 2 diabetes mellitus with diabetic chronic kidney disease: Secondary | ICD-10-CM | POA: Insufficient documentation

## 2022-01-30 DIAGNOSIS — S40012A Contusion of left shoulder, initial encounter: Secondary | ICD-10-CM | POA: Insufficient documentation

## 2022-01-30 DIAGNOSIS — I509 Heart failure, unspecified: Secondary | ICD-10-CM | POA: Insufficient documentation

## 2022-01-30 LAB — CBC WITH DIFFERENTIAL/PLATELET
Abs Immature Granulocytes: 0.02 10*3/uL (ref 0.00–0.07)
Basophils Absolute: 0.1 10*3/uL (ref 0.0–0.1)
Basophils Relative: 1 %
Eosinophils Absolute: 0.1 10*3/uL (ref 0.0–0.5)
Eosinophils Relative: 2 %
HCT: 34.7 % — ABNORMAL LOW (ref 39.0–52.0)
Hemoglobin: 11.2 g/dL — ABNORMAL LOW (ref 13.0–17.0)
Immature Granulocytes: 0 %
Lymphocytes Relative: 14 %
Lymphs Abs: 0.8 10*3/uL (ref 0.7–4.0)
MCH: 32.4 pg (ref 26.0–34.0)
MCHC: 32.3 g/dL (ref 30.0–36.0)
MCV: 100.3 fL — ABNORMAL HIGH (ref 80.0–100.0)
Monocytes Absolute: 0.6 10*3/uL (ref 0.1–1.0)
Monocytes Relative: 11 %
Neutro Abs: 4 10*3/uL (ref 1.7–7.7)
Neutrophils Relative %: 72 %
Platelets: 145 10*3/uL — ABNORMAL LOW (ref 150–400)
RBC: 3.46 MIL/uL — ABNORMAL LOW (ref 4.22–5.81)
RDW: 13 % (ref 11.5–15.5)
WBC: 5.7 10*3/uL (ref 4.0–10.5)
nRBC: 0 % (ref 0.0–0.2)

## 2022-01-30 LAB — BASIC METABOLIC PANEL
Anion gap: 13 (ref 5–15)
BUN: 46 mg/dL — ABNORMAL HIGH (ref 8–23)
CO2: 28 mmol/L (ref 22–32)
Calcium: 8.4 mg/dL — ABNORMAL LOW (ref 8.9–10.3)
Chloride: 92 mmol/L — ABNORMAL LOW (ref 98–111)
Creatinine, Ser: 5.89 mg/dL — ABNORMAL HIGH (ref 0.61–1.24)
GFR, Estimated: 10 mL/min — ABNORMAL LOW (ref >60)
Glucose, Bld: 134 mg/dL — ABNORMAL HIGH (ref 70–99)
Potassium: 5.1 mmol/L (ref 3.5–5.1)
Sodium: 133 mmol/L — ABNORMAL LOW (ref 135–145)

## 2022-01-30 MED ORDER — OXYCODONE-ACETAMINOPHEN 5-325 MG PO TABS
1.0000 | ORAL_TABLET | Freq: Once | ORAL | Status: AC
  Administered 2022-01-30: 1 via ORAL
  Filled 2022-01-30: qty 1

## 2022-01-30 MED ORDER — LIDOCAINE 5 % EX PTCH: 1.0000 | MEDICATED_PATCH | Freq: Two times a day (BID) | CUTANEOUS | 0 refills | Status: AC

## 2022-01-30 MED ORDER — OXYCODONE-ACETAMINOPHEN 5-325 MG PO TABS: 1.0000 | ORAL_TABLET | ORAL | 0 refills | Status: AC | PRN

## 2022-01-30 MED ORDER — LIDOCAINE 5 % EX PTCH
1.0000 | MEDICATED_PATCH | CUTANEOUS | Status: DC
  Administered 2022-01-30: 1 via TRANSDERMAL
  Filled 2022-01-30: qty 1

## 2022-01-30 MED ORDER — IOHEXOL 300 MG/ML  SOLN
100.0000 mL | Freq: Once | INTRAMUSCULAR | Status: AC | PRN
  Administered 2022-01-30: 100 mL via INTRAVENOUS
  Filled 2022-01-30: qty 100

## 2022-01-30 NOTE — ED Provider Notes (Signed)
? ?[facility] ?Provider Note ? ? ? Event Date/Time  ? First MD Initiated Contact with Patient 01/30/22 1153   ?  (approximate) ? ? ?History  ? ?Chief Complaint ?Fall ? ? ?HPI ? ?Danny [name] is a 66 y.o. male with past medical history of hypertension, diabetes, ESRD on HD (MWF), CHF, and gout who presents to the ED for fall.  Patient reports that he was going through a door in his house 5 days ago when he tripped and fell, striking his chin, left chest wall, and left arm.  He denies any loss of consciousness and does not take any blood thinners.  He does report significant pain and swelling along the left side of his mandible as well as the left side of his chest wall.  He has noticed bruising in the area of his left shoulder with pain when moving his left arm, but states his fistula has seem unaffected.  He received dialysis on Monday and Wednesday of this week with out any difficulty.  He complains of pain and bruising along his left chest wall which is sharp and worse when he takes a deep breath.  He has been wearing a brace around his chest to help with the pain, denies any fevers, cough, or shortness of breath.  He has been only taking gabapentin for pain at home. ?  ? ? ?Physical Exam  ? ?Triage Vital Signs: ?ED Triage Vitals [01/30/22 1137]  ?Enc Vitals Group  ?   BP (!) 141/88  ?   Pulse Rate 74  ?   Resp 18  ?   Temp 98.2 ?F (36.8 ?C)  ?   Temp Source Oral  ?   SpO2 94 %  ?   Weight 205 lb 0.4 oz (93 kg)  ?   Height 5\' 8"  (1.727 m)  ?   Head Circumference   ?   Peak Flow   ?   Pain Score 8  ?   Pain Loc   ?   Pain Edu?   ?   Excl. in [place]?   ? ? ?Most recent vital signs: ?Vitals:  ? 01/30/22 1137 01/30/22 1505  ?BP: (!) 141/88 138/87  ?Pulse: 74 70  ?Resp: 18 18  ?Temp: 98.2 ?F (36.8 ?C)   ?SpO2: 94% 96%  ? ? ?Constitutional: Alert and oriented. ?Eyes: Conjunctivae are normal. ?Head: Significant edema and ecchymosis along the body of the left side of the patient's  mandible, no obvious deformity noted. ?Nose: No congestion/rhinnorhea. ?Mouth/Throat: Mucous membranes are moist.  ?Neck: No midline cervical spine tenderness to palpation. ?Cardiovascular: Normal rate, regular rhythm. Grossly normal heart sounds.  2+ radial pulses bilaterally.  Left upper extremity AV fistula with palpable thrill.  Diffuse tenderness to palpation at left shoulder and elbow but with range of motion intact. ?Respiratory: Normal respiratory effort.  No retractions. Lungs CTAB.  Left chest wall tenderness to palpation noted with overlying ecchymosis. ?Gastrointestinal: Soft and nontender. No distention. ?Musculoskeletal: No lower extremity tenderness nor edema.  ?Neurologic:  Normal speech and language. No gross focal neurologic deficits are appreciated. ? ? ? ?ED Results / Procedures / Treatments  ? ?Labs ?(all labs ordered are listed, but only abnormal results are displayed) ?Labs Reviewed  ?CBC WITH DIFFERENTIAL/PLATELET - Abnormal; Notable for the following components:  ?    Result Value  ? RBC 3.46 (*)   ? Hemoglobin 11.2 (*)   ? HCT 34.7 (*)   ? MCV 100.3 (*)   ?  Platelets 145 (*)   ? All other components within normal limits  ?BASIC METABOLIC PANEL - Abnormal; Notable for the following components:  ? Sodium 133 (*)   ? Chloride 92 (*)   ? Glucose, Bld 134 (*)   ? BUN 46 (*)   ? Creatinine, Ser 5.89 (*)   ? Calcium 8.4 (*)   ? GFR, Estimated 10 (*)   ? All other components within normal limits  ? ? ?RADIOLOGY ?Chest x-ray reviewed by me with no infiltrate, edema, or effusion and no obvious fracture noted. ? ?PROCEDURES: ? ?Critical Care performed: No ? ?Procedures ? ? ?MEDICATIONS ORDERED IN ED: ?Medications  ?lidocaine (LIDODERM) 5 % 1 patch (1 patch Transdermal Patch Applied 01/30/22 1344)  ?oxyCODONE-acetaminophen (PERCOCET/ROXICET) 5-325 MG per tablet 1 tablet (1 tablet Oral Given 01/30/22 1343)  ?iohexol (OMNIPAQUE) 300 MG/ML solution 100 mL (100 mLs Intravenous Contrast Given 01/30/22 1514)   ? ? ? ?IMPRESSION / MDM / ASSESSMENT AND PLAN / ED COURSE  ?I reviewed the triage vital signs and the nursing notes. ?             ?               ? ?66 y.o. male with past medical history of hypertension, diabetes, ESRD on HD (MWF), CHF, and gout who presents to the ED complaining of fall 5 days ago striking his chin, left arm, and left chest wall. ? ?Differential diagnosis includes, but is not limited to, intracranial injury, mandible fracture, cervical spine injury, rib fracture, hemothorax, pneumothorax, shoulder fracture, shoulder dislocation, elbow fracture, injury to fistula. ? ?Patient well-appearing and in no acute distress, vital signs are unremarkable and he has no focal neurologic deficits on exam.  He has ecchymosis over his left shoulder but no apparent injury to his AV fistula, plan to further assess with x-rays of his left shoulder and elbow.  He additionally has significant hematoma over his mandible and we will further assess with CT head, cervical spine, and maxillofacial.  Chest x-ray shows no obvious rib fracture but with significant tenderness and ecchymosis, we will further assess with CT scan.  Plan to treat symptomatically with Percocet and Lidoderm patch, patient was counseled against using brace for his chest wall pain. ? ?CBC shows mild anemia with no leukocytosis, BMP consistent with patient's known ESRD, no acute electrolyte abnormality noted.  CT head and neck are unremarkable, maxillofacial CT does show hematoma in the area of mandible but no evidence of facial fracture.  CT scan of chest/abdomen/pelvis shows no evidence of traumatic injury, no rib fractures or hemothorax noted.  X-rays of left shoulder and elbow are also unremarkable.  Patient is appropriate for discharge home with symptomatic management of chest wall contusion including short course of pain medication with Lidoderm patches and incentive spirometer.  He was counseled to follow-up with his PCP and to return to the ED  for new worsening symptoms, patient agrees with plan. ? ?  ? ? ?FINAL CLINICAL IMPRESSION(S) / ED DIAGNOSES  ? ?Final diagnoses:  ?Fall, initial encounter  ?Contusion of left chest wall, initial encounter  ?Contusion of left shoulder, initial encounter  ?Hematoma of jaw, initial encounter  ? ? ? ?Rx / DC Orders  ? ?ED Discharge Orders   ? ?      Ordered  ?  lidocaine (LIDODERM) 5 %  Every 12 hours       ? 01/30/22 1720  ?  oxyCODONE-acetaminophen (PERCOCET) 5-325 MG tablet  Every 4 hours PRN       ? 01/30/22 1720  ?  AMB  Referral to [facility]       ? 01/30/22 1721  ? ?  ?  ? ?  ? ? ? ?Note:  This document was prepared using Dragon voice recognition software and may include unintentional dictation errors. ?  ?Blake Divine, MD ?01/30/22 1722 ? ?

## 2022-01-30 NOTE — ED Triage Notes (Signed)
Pt here with a fall last Fri. Pt c/o pain in his left ribs, left arm, and left chin. Pt states he felt dizzy before the fall but denies LOC. ?

## 2022-01-30 NOTE — ED Provider Triage Note (Signed)
Emergency Medicine Provider Triage Evaluation Note ? ?[facility] Danny [name] , Danny 66 y.o. [name]  was evaluated in triage.  Pt complains of fall on Friday, after tripping while walking into the house. Landed on Left side of chest, still has left rib pain with palpation/rotation. No HS/LOC. ? ?Review of Systems  ?Positive: L chest wall pain ?Negative: SOB ? ?Physical Exam  ?There were no vitals taken for this visit. ?Gen:   Awake, no distress   ?Resp:  Normal effort. L chest wall TTP, no crepitus/ecchymosis ?MSK:   Moves extremities without difficulty  ?Other:  Fistula LUE ? ?Medical Decision Making  ?Medically screening exam initiated at 11:32 AM.  Appropriate orders placed.  [facility] Danny [name] was informed that the remainder of the evaluation will be completed by another provider, this initial triage assessment does not replace that evaluation, and the importance of remaining in the ED until their evaluation is complete. ? ? ?  ?Marquette Old, PA-C ?01/30/22 1139 ? ?

## 2022-01-30 NOTE — ED Notes (Signed)
See triage note  presents with cont'd pain s/p fall  having pain to left rib area and arm ?

## 2022-03-11 MED FILL — CARVEDILOL 3.125 MG TABLET: ORAL | 45 days supply | Qty: 180 | Fill #3

## 2022-03-11 MED FILL — SEVELAMER CARBONATE 800 MG TABLET: 30 days supply | Qty: 360 | Fill #3

## 2022-04-09 ENCOUNTER — Emergency Department: Payer: Self-pay

## 2022-04-09 ENCOUNTER — Other Ambulatory Visit: Payer: Self-pay

## 2022-04-09 ENCOUNTER — Emergency Department
Admission: EM | Admit: 2022-04-09 | Discharge: 2022-04-09 | Disposition: A | Discharge status: Home / Self Care | Payer: Self-pay | Attending: Emergency Medicine | Admitting: Emergency Medicine

## 2022-04-09 DIAGNOSIS — Z992 Dependence on renal dialysis: Secondary | ICD-10-CM | POA: Insufficient documentation

## 2022-04-09 DIAGNOSIS — N186 End stage renal disease: Secondary | ICD-10-CM | POA: Insufficient documentation

## 2022-04-09 DIAGNOSIS — R42 Dizziness and giddiness: Secondary | ICD-10-CM | POA: Insufficient documentation

## 2022-04-09 DIAGNOSIS — H9191 Unspecified hearing loss, right ear: Secondary | ICD-10-CM | POA: Insufficient documentation

## 2022-04-09 LAB — CBC
HCT: 32.1 % — ABNORMAL LOW (ref 39.0–52.0)
Hemoglobin: 10.6 g/dL — ABNORMAL LOW (ref 13.0–17.0)
MCH: 31.6 pg (ref 26.0–34.0)
MCHC: 33 g/dL (ref 30.0–36.0)
MCV: 95.8 fL (ref 80.0–100.0)
Platelets: 142 10*3/uL — ABNORMAL LOW (ref 150–400)
RBC: 3.35 MIL/uL — ABNORMAL LOW (ref 4.22–5.81)
RDW: 13.6 % (ref 11.5–15.5)
WBC: 4 10*3/uL (ref 4.0–10.5)
nRBC: 0 % (ref 0.0–0.2)

## 2022-04-09 LAB — BASIC METABOLIC PANEL
Anion gap: 13 (ref 5–15)
BUN: 21 mg/dL (ref 8–23)
CO2: 32 mmol/L (ref 22–32)
Calcium: 7.9 mg/dL — ABNORMAL LOW (ref 8.9–10.3)
Chloride: 92 mmol/L — ABNORMAL LOW (ref 98–111)
Creatinine, Ser: 3.37 mg/dL — ABNORMAL HIGH (ref 0.61–1.24)
GFR, Estimated: 19 mL/min — ABNORMAL LOW (ref >60)
Glucose, Bld: 220 mg/dL — ABNORMAL HIGH (ref 70–99)
Potassium: 3.7 mmol/L (ref 3.5–5.1)
Sodium: 137 mmol/L (ref 135–145)

## 2022-04-09 NOTE — ED Triage Notes (Addendum)
Pt here with loss of hearing in his right ear 2-3 days ago. Pt states his ear is clogged. Pt is a dialysis pt. Pt also states he has been dizzy for a month. Pt is having multiple falls, last fall 3 days ago where he hit his head but pt denies LOC. Pt having some AMS as well.

## 2022-04-09 NOTE — ED Notes (Signed)
Pt in bed, family at bedside, pt states that he is ready to go home, pt states that he doesn't need an interpreter for d/c instructions, pt verbalized understanding, pt ambulatory from dpt.

## 2022-04-09 NOTE — ED Provider Notes (Signed)
[facility] Provider Note    Event Date/Time   First MD Initiated Contact with Patient 04/09/22 1351     (approximate)   History   Loss of Hearing   HPI  Danny [name] is a 66 y.o. male  with history of ESRD on dialysis, who presents to the emergency department today because of concerns for gradual right-sided hearing loss.  The patient states symptoms started a few days ago.  His hearing is gotten progressively worse since that time.  He does hear buzzing in his ear.  He denies similar symptoms in the past.  Wife states that she also feels like the patient has been more off balance recently.  Physical Exam   Triage Vital Signs: ED Triage Vitals  Enc Vitals Group     BP 04/09/22 1153 137/68     Pulse Rate 04/09/22 1153 81     Resp 04/09/22 1153 18     Temp 04/09/22 1153 98 F (36.7 C)     Temp Source 04/09/22 1153 Oral     SpO2 04/09/22 1153 97 %     Weight 04/09/22 1152 205 lb 0.4 oz (93 kg)     Height 04/09/22 1152 5\' 8"  (1.727 m)     Head Circumference --      Peak Flow --      Pain Score 04/09/22 1152 0     Pain Loc --      Pain Edu? --      Excl. in [place]? --     Most recent vital signs: Vitals:   04/09/22 1153 04/09/22 1338  BP: 137/68 130/60  Pulse: 81 75  Resp: 18 18  Temp: 98 F (36.7 C)   SpO2: 97% 95%    General: Awake, alert, oriented. CV:  Good peripheral perfusion. Regular rate and rhythm. Resp:  Normal effort. Lungs clear. Abd:  No distention.  ENT:  Bilateral TMs without bulging, erythema or puncture.   ED Results / Procedures / Treatments   Labs (all labs ordered are listed, but only abnormal results are displayed) Labs Reviewed  CBC - Abnormal; Notable for the following components:      Result Value   RBC 3.35 (*)    Hemoglobin 10.6 (*)    HCT 32.1 (*)    Platelets 142 (*)    All other components within normal limits  BASIC METABOLIC PANEL - Abnormal; Notable for the following components:    Chloride 92 (*)    Glucose, Bld 220 (*)    Creatinine, Ser 3.37 (*)    Calcium 7.9 (*)    GFR, Estimated 19 (*)    All other components within normal limits     EKG  I, Nance Pear, attending physician, personally viewed and interpreted this EKG  EKG Time: 1159 Rate: 77 Rhythm: normal sinus rhythm Axis: normal Intervals: qtc 547 QRS: LBBB ST changes: no st elevation Impression: abnormal ekg   RADIOLOGY I independently interpreted and visualized the CT head. My interpretation: No bleed Radiology interpretation:  IMPRESSION:  1. No evidence of acute intracranial abnormality.  2. Mild generalized parenchymal atrophy.  3. Mild paranasal sinus mucosal thickening at the imaged levels.  4. Small-volume fluid within the bilateral mastoid air cells.    PROCEDURES:  Critical Care performed: No  Procedures   MEDICATIONS ORDERED IN ED: Medications - No data to display   IMPRESSION / MDM / [place] / ED COURSE  I reviewed the triage vital  signs and the nursing notes.                              Differential diagnosis includes, but is not limited to, stroke, inner ear problem  Patient's presentation is most consistent with acute presentation with potential threat to life or bodily function.  Patient presents to the emergency department today because of concerns for gradual right sided hearing loss.  TMs within normal limits.  No indication for infection.  Given reported history of also balance issues will get MRI to evaluate for possible stroke.  If negative feel patient would be appropriate for ENT follow-up.  FINAL CLINICAL IMPRESSION(S) / ED DIAGNOSES   Final diagnoses:  Hearing loss of right ear, unspecified hearing loss type      Note:  This document was prepared using Dragon voice recognition software and may include unintentional dictation errors.    Nance Pear, MD 04/09/22 [phone]

## 2022-04-09 NOTE — ED Notes (Signed)
Pt back from MRI, pt denies pain, pt talking on phone with family.

## 2022-04-09 NOTE — ED Notes (Signed)
Pt in MRI.

## 2022-04-09 NOTE — ED Provider Notes (Signed)
I [name] this patient approximately 1500.  Please see outgoing providers note for full details regarding patient's initial evaluation and assessment.  In brief he presents for evaluation of decreased hearing in the right ear.  Initial exam is reassuring without clear evidence of a stroke or infection.  CT head was unremarkable.  He is pending MRI brain to rule out a stroke and if negative plan for outpatient ENT follow-up.  MRI brain is negative.  I discussed with patient.  He will follow-up with ENT.  Discharged in stable condition.   Lucrezia Starch, MD 04/09/22 [phone]

## 2022-05-19 ENCOUNTER — Encounter (INDEPENDENT_AMBULATORY_CARE_PROVIDER_SITE_OTHER): Payer: Self-pay | Admitting: Vascular Surgery

## 2022-05-19 ENCOUNTER — Encounter (INDEPENDENT_AMBULATORY_CARE_PROVIDER_SITE_OTHER): Payer: Self-pay

## 2022-06-09 ENCOUNTER — Other Ambulatory Visit (INDEPENDENT_AMBULATORY_CARE_PROVIDER_SITE_OTHER): Payer: Self-pay | Admitting: Vascular Surgery

## 2022-06-09 DIAGNOSIS — N186 End stage renal disease: Secondary | ICD-10-CM

## 2022-06-09 DIAGNOSIS — T82898A Other specified complication of vascular prosthetic devices, implants and grafts, initial encounter: Secondary | ICD-10-CM

## 2022-06-10 ENCOUNTER — Encounter (INDEPENDENT_AMBULATORY_CARE_PROVIDER_SITE_OTHER): Payer: Self-pay | Admitting: Vascular Surgery

## 2022-06-10 ENCOUNTER — Encounter (INDEPENDENT_AMBULATORY_CARE_PROVIDER_SITE_OTHER): Payer: Self-pay

## 2022-06-18 DIAGNOSIS — F329 Major depressive disorder, single episode, unspecified: Principal | ICD-10-CM

## 2022-06-18 DIAGNOSIS — I10 Essential (primary) hypertension: Principal | ICD-10-CM

## 2022-06-18 MED ORDER — TRAZODONE 50 MG TABLET
ORAL_TABLET | Freq: Every evening | ORAL | 3 refills | 90 days
Start: 2022-06-18 — End: 2023-06-13

## 2022-06-18 MED ORDER — CARVEDILOL 3.125 MG TABLET
ORAL_TABLET | Freq: Two times a day (BID) | ORAL | 3 refills | 45 days | Status: CP
Start: 2022-06-18 — End: ?
  Filled 2022-06-23: qty 120, 30d supply, fill #0

## 2022-06-20 MED ORDER — TRAZODONE 50 MG TABLET
ORAL_TABLET | Freq: Every evening | ORAL | 3 refills | 90 days | Status: CP
Start: 2022-06-20 — End: 2023-06-15
  Filled 2022-06-23: qty 30, 30d supply, fill #0

## 2022-06-23 MED FILL — SEVELAMER CARBONATE 800 MG TABLET: 30 days supply | Qty: 360 | Fill #4

## 2022-06-23 MED FILL — NOVOLOG MIX 70-30 FLEXPEN U-100 INSULIN 100 UNIT/ML SUBCUTANEOUS PEN: SUBCUTANEOUS | 100 days supply | Qty: 15 | Fill #2

## 2022-06-23 MED FILL — CINACALCET 60 MG TABLET: ORAL | 30 days supply | Qty: 30 | Fill #1

## 2022-08-03 ENCOUNTER — Emergency Department: Admission: EM | Admit: 2022-08-03 | Payer: Self-pay | Source: Home / Self Care

## 2022-08-09 ENCOUNTER — Emergency Department
Admission: EM | Admit: 2022-08-09 | Discharge: 2022-08-09 | Disposition: A | Discharge status: Home / Self Care | Payer: Self-pay | Attending: Emergency Medicine | Admitting: Emergency Medicine

## 2022-08-09 ENCOUNTER — Other Ambulatory Visit: Payer: Self-pay

## 2022-08-09 ENCOUNTER — Emergency Department: Payer: Self-pay

## 2022-08-09 DIAGNOSIS — W19XXXA Unspecified fall, initial encounter: Secondary | ICD-10-CM

## 2022-08-09 DIAGNOSIS — I5022 Chronic systolic (congestive) heart failure: Secondary | ICD-10-CM | POA: Diagnosis present

## 2022-08-09 DIAGNOSIS — Z794 Long term (current) use of insulin: Secondary | ICD-10-CM | POA: Insufficient documentation

## 2022-08-09 DIAGNOSIS — N186 End stage renal disease: Secondary | ICD-10-CM | POA: Insufficient documentation

## 2022-08-09 DIAGNOSIS — I10 Essential (primary) hypertension: Secondary | ICD-10-CM | POA: Diagnosis present

## 2022-08-09 DIAGNOSIS — E875 Hyperkalemia: Secondary | ICD-10-CM | POA: Diagnosis present

## 2022-08-09 DIAGNOSIS — A084 Viral intestinal infection, unspecified: Secondary | ICD-10-CM

## 2022-08-09 DIAGNOSIS — E1122 Type 2 diabetes mellitus with diabetic chronic kidney disease: Secondary | ICD-10-CM | POA: Diagnosis present

## 2022-08-09 DIAGNOSIS — Z992 Dependence on renal dialysis: Secondary | ICD-10-CM | POA: Insufficient documentation

## 2022-08-09 DIAGNOSIS — R531 Weakness: Secondary | ICD-10-CM

## 2022-08-09 DIAGNOSIS — I132 Hypertensive heart and chronic kidney disease with heart failure and with stage 5 chronic kidney disease, or end stage renal disease: Secondary | ICD-10-CM | POA: Insufficient documentation

## 2022-08-09 DIAGNOSIS — Z7982 Long term (current) use of aspirin: Secondary | ICD-10-CM | POA: Insufficient documentation

## 2022-08-09 DIAGNOSIS — Z91158 Patient's noncompliance with renal dialysis for other reason: Secondary | ICD-10-CM | POA: Insufficient documentation

## 2022-08-09 DIAGNOSIS — Z79899 Other long term (current) drug therapy: Secondary | ICD-10-CM | POA: Insufficient documentation

## 2022-08-09 DIAGNOSIS — D696 Thrombocytopenia, unspecified: Secondary | ICD-10-CM | POA: Diagnosis present

## 2022-08-09 LAB — CBC WITH DIFFERENTIAL/PLATELET
Abs Immature Granulocytes: 0.03 10*3/uL (ref 0.00–0.07)
Basophils Absolute: 0 10*3/uL (ref 0.0–0.1)
Basophils Relative: 0 %
Eosinophils Absolute: 0.1 10*3/uL (ref 0.0–0.5)
Eosinophils Relative: 1 %
HCT: 42.5 % (ref 39.0–52.0)
Hemoglobin: 14.7 g/dL (ref 13.0–17.0)
Immature Granulocytes: 1 %
Lymphocytes Relative: 12 %
Lymphs Abs: 0.7 10*3/uL (ref 0.7–4.0)
MCH: 31.5 pg (ref 26.0–34.0)
MCHC: 34.6 g/dL (ref 30.0–36.0)
MCV: 91.2 fL (ref 80.0–100.0)
Monocytes Absolute: 0.7 10*3/uL (ref 0.1–1.0)
Monocytes Relative: 13 %
Neutro Abs: 4.1 10*3/uL (ref 1.7–7.7)
Neutrophils Relative %: 73 %
Platelets: 88 10*3/uL — ABNORMAL LOW (ref 150–400)
RBC: 4.66 MIL/uL (ref 4.22–5.81)
RDW: 13.9 % (ref 11.5–15.5)
Smear Review: DECREASED
WBC: 5.6 10*3/uL (ref 4.0–10.5)
nRBC: 0 % (ref 0.0–0.2)

## 2022-08-09 LAB — COMPREHENSIVE METABOLIC PANEL
ALT: 12 U/L (ref 0–44)
AST: 15 U/L (ref 15–41)
Albumin: 3.8 g/dL (ref 3.5–5.0)
Alkaline Phosphatase: 104 U/L (ref 38–126)
Anion gap: 22 — ABNORMAL HIGH (ref 5–15)
BUN: 144 mg/dL — ABNORMAL HIGH (ref 8–23)
CO2: 11 mmol/L — ABNORMAL LOW (ref 22–32)
Calcium: 8.6 mg/dL — ABNORMAL LOW (ref 8.9–10.3)
Chloride: 98 mmol/L (ref 98–111)
Creatinine, Ser: 18.54 mg/dL — ABNORMAL HIGH (ref 0.61–1.24)
GFR, Estimated: 2 mL/min — ABNORMAL LOW (ref >60)
Glucose, Bld: 141 mg/dL — ABNORMAL HIGH (ref 70–99)
Potassium: 6.8 mmol/L (ref 3.5–5.1)
Sodium: 131 mmol/L — ABNORMAL LOW (ref 135–145)
Total Bilirubin: 1 mg/dL (ref 0.3–1.2)
Total Protein: 7.9 g/dL (ref 6.5–8.1)

## 2022-08-09 LAB — HEPATITIS B SURFACE ANTIGEN: Hepatitis B Surface Ag: NONREACTIVE

## 2022-08-09 LAB — CBG MONITORING, ED
Glucose-Capillary: 140 mg/dL — ABNORMAL HIGH (ref 70–99)
Glucose-Capillary: 168 mg/dL — ABNORMAL HIGH (ref 70–99)

## 2022-08-09 LAB — HEMOGLOBIN A1C
Hgb A1c MFr Bld: 6.5 % — ABNORMAL HIGH (ref 4.8–5.6)
Mean Plasma Glucose: 139.85 mg/dL

## 2022-08-09 LAB — MAGNESIUM: Magnesium: 3.2 mg/dL — ABNORMAL HIGH (ref 1.7–2.4)

## 2022-08-09 MED ORDER — CARVEDILOL 3.125 MG PO TABS: 3.1250 mg | ORAL_TABLET | Freq: Two times a day (BID) | ORAL | Status: DC

## 2022-08-09 MED ORDER — CALCIUM ACETATE (PHOS BINDER) 667 MG PO CAPS: 2001.0000 mg | ORAL_CAPSULE | Freq: Three times a day (TID) | ORAL | Status: DC

## 2022-08-09 MED ORDER — FUROSEMIDE 40 MG PO TABS: 40.0000 mg | ORAL_TABLET | Freq: Every day | ORAL | Status: DC

## 2022-08-09 MED ORDER — CHLORHEXIDINE GLUCONATE CLOTH 2 % EX PADS
6.0000 | MEDICATED_PAD | Freq: Every day | CUTANEOUS | Status: DC
  Filled 2022-08-09: qty 6

## 2022-08-09 MED ORDER — CALCITRIOL 0.25 MCG PO CAPS: 0.2500 ug | ORAL_CAPSULE | Freq: Every day | ORAL | Status: DC

## 2022-08-09 MED ORDER — HEPARIN SODIUM (PORCINE) 1000 UNIT/ML DIALYSIS: 20.0000 [IU]/kg | INTRAMUSCULAR | Status: DC | PRN

## 2022-08-09 MED ORDER — INSULIN ASPART 100 UNIT/ML IV SOLN
5.0000 [IU] | Freq: Once | INTRAVENOUS | Status: AC
  Administered 2022-08-09: 5 [IU] via INTRAVENOUS
  Filled 2022-08-09: qty 0.05

## 2022-08-09 MED ORDER — CINACALCET HCL 30 MG PO TABS: 30.0000 mg | ORAL_TABLET | Freq: Every day | ORAL | Status: DC

## 2022-08-09 MED ORDER — NEPRO/CARBSTEADY PO LIQD: 237.0000 mL | Freq: Two times a day (BID) | ORAL | Status: DC

## 2022-08-09 MED ORDER — ONDANSETRON HCL 4 MG/2ML IJ SOLN: 4.0000 mg | Freq: Four times a day (QID) | INTRAMUSCULAR | Status: DC | PRN

## 2022-08-09 MED ORDER — ONDANSETRON HCL 4 MG PO TABS: 4.0000 mg | ORAL_TABLET | Freq: Four times a day (QID) | ORAL | Status: DC | PRN

## 2022-08-09 MED ORDER — SODIUM BICARBONATE 8.4 % IV SOLN
50.0000 meq | Freq: Once | INTRAVENOUS | Status: AC
  Administered 2022-08-09: 50 meq via INTRAVENOUS
  Filled 2022-08-09: qty 50

## 2022-08-09 MED ORDER — CALCIUM GLUCONATE-NACL 1-0.675 GM/50ML-% IV SOLN: 1.0000 g | Freq: Once | INTRAVENOUS | Status: DC

## 2022-08-09 MED ORDER — DEXTROSE 50 % IV SOLN
1.0000 | Freq: Once | INTRAVENOUS | Status: AC
  Administered 2022-08-09: 50 mL via INTRAVENOUS
  Filled 2022-08-09: qty 50

## 2022-08-09 MED ORDER — GABAPENTIN 300 MG PO CAPS: 300.0000 mg | ORAL_CAPSULE | Freq: Every day | ORAL | Status: DC

## 2022-08-09 MED ORDER — ACETAMINOPHEN 325 MG RE SUPP: 650.0000 mg | Freq: Four times a day (QID) | RECTAL | Status: DC | PRN

## 2022-08-09 MED ORDER — ASPIRIN 81 MG PO TBEC
81.0000 mg | DELAYED_RELEASE_TABLET | Freq: Every day | ORAL | Status: DC
  Administered 2022-08-09: 81 mg via ORAL
  Filled 2022-08-09: qty 1

## 2022-08-09 MED ORDER — OXYCODONE-ACETAMINOPHEN 5-325 MG PO TABS: 1.0000 | ORAL_TABLET | ORAL | Status: DC | PRN

## 2022-08-09 MED ORDER — INSULIN ASPART 100 UNIT/ML IJ SOLN
0.0000 [IU] | Freq: Three times a day (TID) | INTRAMUSCULAR | Status: DC
  Administered 2022-08-09: 1 [IU] via SUBCUTANEOUS
  Filled 2022-08-09: qty 1

## 2022-08-09 MED ORDER — SEVELAMER CARBONATE 800 MG PO TABS: 800.0000 mg | ORAL_TABLET | ORAL | Status: DC

## 2022-08-09 MED ORDER — ACETAMINOPHEN 325 MG PO TABS: 650.0000 mg | ORAL_TABLET | Freq: Four times a day (QID) | ORAL | Status: DC | PRN

## 2022-08-09 MED ORDER — AMLODIPINE BESYLATE 5 MG PO TABS: 5.0000 mg | ORAL_TABLET | Freq: Every day | ORAL | Status: DC

## 2022-08-09 NOTE — ED Notes (Signed)
Use of call light to report that he was leaving.  AMA form signed.  IV Dc'd and pt left with daughter to POV.  Dr Francine Graven notified.

## 2022-08-09 NOTE — ED Notes (Signed)
Use of call light to report he was leaving.  Pt signed AMA and left with daughter.

## 2022-08-09 NOTE — H&P (Signed)
History and Physical    Patient: Danny [name] ZOX:[id number] DOB: 1955/10/28 DOA: 08/09/2022 DOS: the patient was seen and examined on 08/09/2022 PCP: Lianne Moris, MD  Patient coming from: Home  Chief Complaint:  Chief Complaint  Patient presents with   Weakness    Patient states he's missed his dialysis this whole week. Last dialysis was Friday the 3rd. Patient states he missed because he's had many episodes of diarrhea since this past Monday. Diarrhea has improved over the last day, but currently feels generalized weakness and fatigue. +Abdominal pain    HPI: Danny [name] is a 66 y.o. 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 Patient states that he has had diarrhea for the whole week and it appears that all members of his family had a viral GI illness.  Symptoms were associated with anorexia, nausea and vomiting patient was unable to tolerate any oral intake. He missed all his dialysis sessions this week because he did not feel well and according to his daughter he fell 2 days prior to this hospitalization due to generalized weakness.  Patient states that he felt dizzy and lightheaded prior to the fall but denies any loss of consciousness or head trauma. His GI symptoms have improved but he remains weak.  He denies having any chest pain, no shortness of breath, no leg swelling, no headache, no fever, no chills, no blurred vision, no focal deficit. Labs show a potassium of 6.8, BUN of 144 and creatinine of 18.54 Twelve-lead EKG did not show peaked T waves and only showed a left bundle branch block which appears to be chronic He received calcium gluconate IV, insulin and dextrose as well as sodium  bicarbonate. Nephrology was consulted with plans for emergent renal replacement therapy.    Review of Systems: As mentioned in the history of present illness. All other systems reviewed and are negative. Past Medical History:  Diagnosis Date   Diabetes mellitus without complication [facility])    Dialysis patient ([place])    tuesday, thursday and saturday   Gout    Hypertension    Kidney disease    Past Surgical History:  Procedure Laterality Date   AV FISTULA PLACEMENT     miscellaneous     peritoneal dialysis catheter placement and removal   Social History:  reports that he has never smoked. He has never used smokeless tobacco. He reports that he does not drink alcohol and does not use drugs.  No Known Allergies  Family History  Problem Relation Age of Onset   Diabetes Mother    Prostate cancer Father    Kidney disease Sister     Prior to Admission medications   Medication Sig Start Date End Date Taking? Authorizing Provider  albuterol (VENTOLIN HFA) 108 (90 Base) MCG/ACT inhaler Inhale 2 puffs into the lungs every 6 (six) hours as needed for wheezing. Patient not taking: Reported on 02/19/2021 07/19/18   [provider]  amLODipine (NORVASC) 5 MG tablet Take 5 mg by mouth at bedtime. 11/05/18   [provider]  aspirin 81 MG EC tablet Take 1 tablet by mouth daily. 08/01/16   [provider]  calcitRIOL (ROCALTROL) 0.25 MCG capsule Take 1 capsule by mouth daily. 12/28/15   [provider]  calcium acetate (PHOSLO) 667 MG capsule Take 2,001 mg by mouth 3 (three) times daily with meals.     [provider]  carvedilol (COREG) 3.125 MG tablet Take 3.125 mg by mouth 2 (two) times daily with a meal.    [provider]  cinacalcet (SENSIPAR) 30 MG tablet Take 1 tablet by mouth daily. 04/10/20   [provider]  citalopram (CELEXA) 20 MG tablet Take 20 mg by mouth daily.  Patient not taking: Reported on 02/19/2021    [provider]  furosemide (LASIX) 40 MG tablet Take 1 tablet by mouth daily. 12/28/15   [provider]  gabapentin (NEURONTIN) 300 MG capsule Take 300 mg by mouth daily. 04/08/19   [provider]  insulin aspart protamine- aspart (NOVOLOG MIX 70/30) (70-30) 100 UNIT/ML injection Inject 0.05-0.1 mLs (5-10 Units total) into the skin See admin instructions. 5 units every morning and 4 units every evening 02/21/21   Wouk, Ailene Rud, MD  lidocaine (LIDODERM) 5 % Place 1 patch onto the skin every 12 (twelve) hours. Remove & Discard patch within 12 hours or as directed by MD 01/30/22 01/30/23  Blake Divine, MD  lisinopril (ZESTRIL) 20 MG tablet Take 0.5 tablets (10 mg total) by mouth daily. 02/21/21   Wouk, Ailene Rud, MD  loperamide (IMODIUM) 2 MG capsule Take 1 capsule (2 mg total) by mouth every 6 (six) hours as needed for diarrhea or loose stools. Patient not taking: Reported on 02/19/2021 07/29/18   Henreitta Leber, MD  multivitamin (RENA-VIT) TABS tablet Take 1 tablet by mouth at bedtime. Patient not taking: Reported on 02/19/2021 08/04/19   Swayze, Ava, DO  Nutritional Supplements (FEEDING SUPPLEMENT, NEPRO CARB STEADY,) LIQD Take 237 mLs by mouth 2 (two) times daily between meals. 08/05/19   Swayze, Ava, DO  omeprazole (PRILOSEC OTC) 20 MG tablet Take 20 mg by mouth daily. Patient not taking: Reported on 02/19/2021    [provider]  oxyCODONE-acetaminophen (PERCOCET) 5-325 MG tablet Take 1 tablet by mouth every 4 (four) hours as needed for severe pain. 01/30/22 01/30/23  Blake Divine, MD  sevelamer carbonate (RENVELA) 800 MG tablet Take 800-3,200 mg by mouth See admin instructions. Take 4 tablets (3200mg ) by mouth three times daily with meals    [provider]  sodium zirconium cyclosilicate (LOKELMA) 5 g packet Take 5 g by mouth daily. 02/21/21   Wouk, Ailene Rud, MD  traZODone (DESYREL) 50 MG tablet Take 1 tablet by mouth at bedtime. 04/13/20 02/26/21  [provider]    Physical Exam: Vitals:   08/09/22 1019 08/09/22 1050 08/09/22 1100 08/09/22 1200  BP: 101/65 111/61 114/64 (!) 143/72  Pulse:   78   Resp:   18   Temp:      TempSrc:      SpO2:   98%   Weight:      Height:       Physical Exam Vitals and nursing note reviewed.  Constitutional:      Appearance: Normal appearance.  HENT:     Head: Normocephalic.     Nose: Nose normal.     Mouth/Throat:     Mouth: Mucous membranes are moist.  Eyes:     Conjunctiva/sclera: Conjunctivae normal.  Cardiovascular:     Rate and Rhythm: Normal rate and regular rhythm.  Pulmonary:     Effort: Pulmonary effort is normal.     Breath sounds: Normal breath sounds.  Abdominal:     General: Abdomen is  flat. Bowel sounds are normal.     Palpations: Abdomen is soft.  Musculoskeletal:        General: Normal range of motion.     Cervical back: Normal range of motion and neck supple.  Skin:    General: Skin is warm and dry.  Neurological:     Mental Status: He is alert and oriented to person, place, and time.     Motor: Weakness present.  Psychiatric:        Mood and Affect: Mood normal.        Behavior: Behavior normal.     Data Reviewed: Relevant notes from primary care and specialist visits, past discharge summaries as available in EHR, including Care Everywhere. Prior diagnostic testing as pertinent to current admission diagnoses Updated medications and problem lists for reconciliation ED course, including vitals, labs, imaging, treatment and response to treatment Triage notes, nursing and pharmacy notes and ED provider's notes Notable results as noted in HPI Labs reviewed.  Magnesium 3.2, sodium 131, potassium 6.8, chloride 98, bicarb 11, glucose 141, BUN 144, creatinine 18.54, calcium 8.6, total protein 7.9, albumin 3.8, AST 15, ALT 12, alkaline phosphatase 104, total bilirubin 1.0, white count 5.6, hemoglobin 14.7, hematocrit 22.5, platelet count 88 Chest x-ray reviewed by me  shows Mild vascular congestion without pulmonary edema. No acute findings Twelve-lead EKG reviewed by me shows sinus rhythm with a left bundle branch block Assessment and Plan: * Hyperkalemia Secondary to missed dialysis Hold lisinopril Patient received calcium gluconate, insulin, dextrose and sodium bicarbonate and nephrology has been consulted for emergent renal replacement therapy  Chronic systolic CHF (congestive heart failure) ([place]) Stable and not acutely exacerbated Last known LVEF 35 to 40% from a 2D echocardiogram which was done in 2020 Hold furosemide, lisinopril and carvedilol for now since patient is normotensive Repeat 2D echocardiogram to assess LVEF  Fall at home, initial encounter Status post fall at home due to weakness related to GI losses from viral gastroenteritis Place patient on fall precautions Monitor closely during this hospitalization  Hypertension Patient is normotensive Hold amlodipine, lisinopril and carvedilol for now  Diabetes mellitus with ESRD (end-stage renal disease) ([place]) Patient has diabetes mellitus with complications of end-stage renal disease and is on hemodialysis Dialysis days are Monday/Wednesday/Friday Patient had missed his dialysis sessions last week because he felt unwell He has significant electrolyte abnormalities which include hyperkalemia, metabolic acidosis, significantly elevated BUN and creatinine Nephrology consult for emergent renal replacement therapy Maintain consistent carbohydrate diet Glycemic control with sliding scale insulin  Thrombocytopenia ([place]) Acute and etiology is unclear Monitor patient closely for signs of bleeding Hold heparin      Advance Care Planning:   Code Status: Full Code   Consults: Nephrology  Family Communication: Greater than 50% of time was spent discussing patient's condition and plan of care with him and his daughter at the bedside.  All questions and concerns have been addressed.  They  verbalized understanding and agree with the plan.  Severity of Illness: The appropriate patient status for this patient is OBSERVATION. Observation status is judged to be reasonable and necessary in order to provide the required intensity of service to ensure the patient's safety. The patient's presenting symptoms, physical exam findings, and initial radiographic and laboratory data in the context of their medical condition is felt to place them at decreased risk for further clinical deterioration. Furthermore, it is anticipated that the patient will be medically stable for discharge from the hospital within 2 midnights of admission.  Author: Collier Bullock, MD 08/09/2022 12:26 PM  For on call review [email or web address].

## 2022-08-09 NOTE — Assessment & Plan Note (Signed)
Stable and not acutely exacerbated Last known LVEF 35 to 40% from a 2D echocardiogram which was done in 2020 Hold furosemide, lisinopril and carvedilol for now since patient is normotensive Repeat 2D echocardiogram to assess LVEF

## 2022-08-09 NOTE — ED Notes (Signed)
Not in room

## 2022-08-09 NOTE — Progress Notes (Signed)
       CROSS COVER NOTE  NAME: Danny [name] MRN: [id number] DOB : 1956/09/01    Time of Service   2002 pm  HPI/Events of Note   Informed by nursing patient left against medical advice  Kathlene Cote NP

## 2022-08-09 NOTE — Progress Notes (Signed)
Post hd rn assessment 

## 2022-08-09 NOTE — ED Notes (Signed)
Not in room, pending arrival from HD

## 2022-08-09 NOTE — ED Notes (Signed)
EDP Dr. Jari Pigg notified of K 6.9, face to face, plan for HD.

## 2022-08-09 NOTE — Assessment & Plan Note (Signed)
Status post fall at home due to weakness related to GI losses from viral gastroenteritis Place patient on fall precautions Monitor closely during this hospitalization

## 2022-08-09 NOTE — Progress Notes (Signed)
[facility], [place] 08/09/22  Subjective:   LOS: 0  Patient known to our practice from previous admissions, last seen in May 2022.  Today he presents with his daughter with 1 week of nausea, vomiting, diarrhea.  Multiple family members have this illness.  He has missed dialysis all week.  Today he presents for evaluation Last HD was last week. Work-up in the emergency room shows critically elevated potassium of 6.8, BUN of 144, creatinine of 18.54, magnesium 3.2, platelets 88.  Patient reports he has been drinking GatoradeFor hydration.  Objective:  Vital signs in last 24 hours:  Temp:  [97.7 F (36.5 C)] 97.7 F (36.5 C) (11/11 0551) Pulse Rate:  [68-72] 70 (11/11 0730) Resp:  [14-18] 17 (11/11 0730) BP: (94-117)/(52-64) 114/55 (11/11 0630) SpO2:  [97 %-100 %] 97 % (11/11 0730) Weight:  [92 kg] 92 kg (11/11 0554)  Weight change:  Filed Weights   08/09/22 0554  Weight: 92 kg    Intake/Output:   No intake or output data in the 24 hours ending 08/09/22 0821   Physical Exam: General: No acute distress, laying in the bed  HEENT Moist oral mucous membranes  Pulm/lungs Normal breathing effort on room air, Clear to auscultation  CVS/Heart Regular rhythm with ectopic beats  Abdomen:  Soft, nontender, nondistended  Extremities: No peripheral edema  Neurologic: Alert, oriented  Skin: No acute rashes  Access: Left upper extremity AV fistula, aneurysmal       Basic Metabolic Panel:  Recent Labs  Lab 08/09/22 0630  NA 131*  K 6.8*  CL 98  CO2 11*  GLUCOSE 141*  BUN 144*  CREATININE 18.54*  CALCIUM 8.6*  MG 3.2*     CBC: Recent Labs  Lab 08/09/22 0630  WBC 5.6  NEUTROABS 4.1  HGB 14.7  HCT 42.5  MCV 91.2  PLT 88*      Lab Results  Component Value Date   HEPBSAG NON REACTIVE 11/23/2019   HEPBSAB Non Reactive 09/22/2017      Microbiology:  No results found for this or any previous visit (from the past 240  hour(s)).  Coagulation Studies: No results for input(s): "LABPROT", "INR" in the last 72 hours.  Urinalysis: No results for input(s): "COLORURINE", "LABSPEC", "PHURINE", "GLUCOSEU", "HGBUR", "BILIRUBINUR", "KETONESUR", "PROTEINUR", "UROBILINOGEN", "NITRITE", "LEUKOCYTESUR" in the last 72 hours.  Invalid input(s): "APPERANCEUR"    Imaging: DG Chest Portable 1 View  Result Date: 08/09/2022 CLINICAL DATA:  Missed dialysis for 1 week.  Question edema. EXAM: PORTABLE CHEST 1 VIEW COMPARISON:  01/30/2022 FINDINGS: The lungs are clear without focal pneumonia, edema, pneumothorax or pleural effusion. The cardiopericardial silhouette is within normal limits for size. Mild vascular congestion noted. The visualized bony structures of the thorax are unremarkable. Telemetry leads overlie the chest. IMPRESSION: Mild vascular congestion without pulmonary edema. No acute findings. Electronically Signed   By: Misty Stanley M.D.   On: 08/09/2022 06:54     Medications:     Chlorhexidine Gluconate Cloth  6 each Topical Q0600   insulin aspart  5 Units Intravenous Once   And   dextrose  1 ampule Intravenous Once   sodium bicarbonate  50 mEq Intravenous Once     Assessment/ Plan:  66 y.o. male with end-stage renal disease, diabetes, hypertension, chronic systolic CHF EF 35 to 53% in 05/19/2019, history of depression was admitted on 08/09/2022 for  Active Problems:   * No active hospital problems. *  abd pain  #.  Recurrent hyperkalemia  in the setting of ESRD  [facility] nephrology/[facility]/IV iron Stat hemodialysis ordered.  Dialysis nurse notified. We will dialyze on 1K bath. Check potassium in a.m.  If elevated, may need another treatment versus Lokelma. Patient advised to drink Gatorade in future.  He can try Sprite or other clear drinks.  #. Anemia of CKD  Lab Results  Component Value Date   HGB 14.7 08/09/2022   Low dose EPO with HD if hemoglobin drops below 10  #. Secondary  hyperparathyroidism of renal origin N 25.81      Component Value Date/Time   PTH 359 (H) 08/01/2019 1710   Lab Results  Component Value Date   PHOS 5.3 (H) 02/20/2021   Monitor calcium and phos level during this admission Patient was on PhosLo at home   #. Diabetes type 2 with CKD Hgb A1c MFr Bld (%)  Date Value  02/20/2021 6.1 (H)  Outpatient managed with NovoLog 70/30   LOS: 0 Alwaleed Obeso 11/11/20238:21 AM  [place], [place]   Critical care time approx 45 minutes.  Case discussed and care coordinated with the emergency room medical team and dialysis staff.  Plan of treatment discussed with patient and family.

## 2022-08-09 NOTE — Progress Notes (Signed)
Pre hd rn assessment, interpreter present

## 2022-08-09 NOTE — Progress Notes (Signed)
Pt uf off, sbp<90, pt resting, MD notified. Continue to monitor, safety maintained.

## 2022-08-09 NOTE — Assessment & Plan Note (Signed)
Patient is normotensive Hold amlodipine, lisinopril and carvedilol for now

## 2022-08-09 NOTE — Assessment & Plan Note (Signed)
Acute and etiology is unclear Monitor patient closely for signs of bleeding Hold heparin

## 2022-08-09 NOTE — Progress Notes (Signed)
Pt clots from access during cannulation. Re cannulation of venous site. Sherlyn Hay, NP present and aware.

## 2022-08-09 NOTE — ED Provider Notes (Signed)
7:32 AM Assumed care for off going team.   Blood pressure 117/64, pulse 68, temperature 97.7 F (36.5 C), temperature source Oral, resp. rate 14, height 5\' 8"  (1.727 m), weight 92 kg, SpO2 97 %.  See their HPI for full report but in brief pending labs-   CBC has a potassium level of 6.8 and creatinine is up to 18.54 and BUN is 144.  CBC is reassuring except for some slightly low platelets but has had some downtrending platelets previously.  Magnesium was slightly elevated at 3.2.  Given the hyperkalemia I looked at his EKG with a left bundle branch block.  Does not seem to have any significant changes from a  Discussed the case with Dr. Candiss Norse from nephrology patient may need multiple sessions of dialysis given elevated potassium as well as elevated BUN so recommends admission to the hospitalist.  We will discuss the hospital team for admission.  In the meantime we will treat patient's hyperkalemia with insulin, dextrose and bicarb.  .Critical Care  Performed by: Vanessa Bremen, MD Authorized by: Vanessa Codington, MD   Critical care provider statement:    Critical care time (minutes):  30   Critical care was necessary to treat or prevent imminent or life-threatening deterioration of the following conditions: renal failure- hyperkalemia.   Critical care was time spent personally by me on the following activities:  Development of treatment plan with patient or surrogate, discussions with consultants, evaluation of patient's response to treatment, examination of patient, ordering and review of laboratory studies, ordering and review of radiographic studies, ordering and performing treatments and interventions, pulse oximetry, re-evaluation of patient's condition and review of old charts   Final impression: Hyperkalemia  CKD on dialysis      Vanessa , MD 08/09/22 [phone]

## 2022-08-09 NOTE — ED Notes (Signed)
MD at BS

## 2022-08-09 NOTE — ED Notes (Signed)
HD planned for ~ 20 minutes.

## 2022-08-09 NOTE — ED Provider Notes (Signed)
[facility] Provider Note    Event Date/Time   First MD Initiated Contact with Patient 08/09/22 0544     (approximate)   History   Weakness (Patient states he's missed his dialysis this whole week. Last dialysis was Friday the 3rd. Patient states he missed because he's had many episodes of diarrhea since this past Monday. Diarrhea has improved over the last day, but currently feels generalized weakness and fatigue. +Abdominal pain )   HPI The patient and/or family speak(s) Spanish.  They understand they have the right to the use of a hospital interpreter, however at this time they prefer to speak directly with me in [place].  They know that they can ask for an interpreter at any time.  [facility] [place] Danny [name] is a 66 y.o. male  whose medical history includes Chronic kidney disease on hemodialysis.  Who presents for evaluation of generalized weakness in the setting of about 5 days of diarrhea.  His daughter is at bedside and she reports that multiple family members were told that they have a viral GI illness, possibly rotavirus, and they have all been suffering from diarrhea.  The patient claims that 5 days ago he had as many as 40 episodes of diarrhea but that they have been tapering off, and by yesterday he only had one episode.  He had several episodes of vomiting a few days ago but that has also gone away.  Mostly he just feels weak and was concerned because he had not been to dialysis at any point this week and wanted to make sure he was okay.  He denies any fever, chest pain, shortness of breath, persistent vomiting, persistent diarrhea, and abdominal pain.  He reports that he is not even having any cramping.  He has no focal weakness or numbness.  He is otherwise feeling okay.     Physical Exam   Triage Vital Signs: ED Triage Vitals  Enc Vitals Group     BP 08/09/22 0551 117/64     Pulse Rate 08/09/22 0551 68     Resp 08/09/22 0551 14     Temp 08/09/22  0551 97.7 F (36.5 C)     Temp Source 08/09/22 0551 Oral     SpO2 08/09/22 0551 97 %     Weight 08/09/22 0554 92 kg (202 lb 13.2 oz)     Height 08/09/22 0554 1.727 m (5\' 8" )     Head Circumference --      Peak Flow --      Pain Score 08/09/22 0552 10     Pain Loc --      Pain Edu? --      Excl. in [place]? --     Most recent vital signs: Vitals:   08/09/22 0551  BP: 117/64  Pulse: 68  Resp: 14  Temp: 97.7 F (36.5 C)  SpO2: 97%     General: Awake, no distress.  Generally well-appearing in spite of his chronic medical conditions. CV:  Good peripheral perfusion.  Normal heart sounds. Resp:  Normal effort.  Lungs are clear to auscultation bilaterally. Abd:  No distention.  No tenderness to palpation Other:  No focal neurological deficitis.   ED Results / Procedures / Treatments   Labs (all labs ordered are listed, but only abnormal results are displayed) Labs Reviewed  MAGNESIUM  CBC WITH DIFFERENTIAL/PLATELET  COMPREHENSIVE METABOLIC PANEL     EKG  ED ECG REPORT I, Danny [name], the attending physician, personally viewed and  interpreted this ECG.  Date: 08/09/2022 EKG Time: 6 AM Rate: 75 Rhythm: normal sinus rhythm QRS Axis: normal Intervals: LBBB ST/T Wave abnormalities: Non-specific ST segment / T-wave changes, but no clear evidence of acute ischemia. Narrative Interpretation: no definitive evidence of acute ischemia; does not meet STEMI criteria.    RADIOLOGY See hospital course for details: Chest x-ray shows some mild interstitial edema, no acute abnormality.    PROCEDURES:  Critical Care performed: No  .1-3 Lead EKG Interpretation  Performed by: Danny Kehr, MD Authorized by: Danny Kehr, MD     Interpretation: normal     ECG rate:  68   ECG rate assessment: normal     Rhythm: sinus rhythm     Ectopy: none     Conduction: normal      MEDICATIONS ORDERED IN ED: Medications - No data to display   IMPRESSION / MDM / [place] / ED COURSE  I reviewed the triage vital signs and the nursing notes.                              Differential diagnosis includes, but is not limited to, metabolic or electrolyte abnormality, dehydration, nonspecific viral gastroenteritis, toxic megacolon, C. difficile.  Patient's presentation is most consistent with acute presentation with potential threat to life or bodily function.  In spite of the patient's chronic medical conditions and acute complaints, he is actually well-appearing and in no distress, conversant, laughing and joking with me.  His vital signs are stable and within normal limits.  Labs/studies ordered: EKG, cardiac monitoring, 1 view chest x-ray, comprehensive metabolic panel, magnesium level, CBC with differential.  His daughter confirmed that multiple family members have had a viral gastroenteritis.  The patient admits that the symptoms have improved dramatically over the last couple of days, he is just feeling weak.  In spite of missing dialysis, he has had no respiratory symptoms and his lungs are clear to auscultation.  He is lying in a semirecumbent position without any difficulty and he has a resting room air saturation of 97%.  I think it is unlikely he will require emergent intervention at this time.  I talked with him about not intervening with IV fluids at this time given his dialysis status and the fact that he is tolerating oral intake.  We will check basic labs and his chest x-ray to make sure there is no sign of pulmonary edema and to see if his electrolytes are appropriate.  I anticipate he will be able to be discharged for outpatient follow-up.  He and his daughter understand and agree with the plan.   Clinical Course as of 08/09/22 0732  Sat Aug 09, 2022  0722 I viewed and interpreted the patient's 1 view chest x-ray.  There is a little bit of haziness to suggest some mild pulmonary edema, but he has no respiratory difficulty and no abnormal vital  signs.  No indication to intervene emergently.  I am transferring ED care to Dr. Jari [name].  She will follow-up on lab work and reassess the patient to determine if he needs any interventions or if he is appropriate for discharge and outpatient follow-up for dialysis. [CF]    Clinical Course User Index [CF] Danny Kehr, MD     FINAL CLINICAL IMPRESSION(S) / ED DIAGNOSES   Final diagnoses:  Viral gastroenteritis  Chronic kidney disease on chronic dialysis ([place])  Generalized weakness     Rx /  DC Orders   ED Discharge Orders     None        Note:  This document was prepared using Dragon voice recognition software and may include unintentional dictation errors.   Danny Kehr, MD 08/09/22 [phone]

## 2022-08-09 NOTE — Assessment & Plan Note (Addendum)
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

## 2022-08-09 NOTE — Progress Notes (Signed)
Waiting for patient transport from ED to [facility]. Delayed. Interpreter present in [place] for patient.

## 2022-08-09 NOTE — Progress Notes (Signed)
Pt completed 3 hour HD treatment w/ no complications. Alert, vss. Report to ED RN. Start: 0925 End: 7614 73ml fluid removed as ordered 53.9L BVP Unable to attain weight d/t weakness when standing, fall precautions and ED bed scale shows a negative number. No HD meds ordered.

## 2022-08-09 NOTE — Assessment & Plan Note (Addendum)
Secondary to missed dialysis Hold lisinopril Patient received calcium gluconate, insulin, dextrose and sodium bicarbonate and nephrology has been consulted for emergent renal replacement therapy

## 2022-08-10 LAB — HEPATITIS B SURFACE ANTIBODY, QUANTITATIVE: Hep B S AB Quant (Post): 27.3 m[IU]/mL (ref >9.9)

## 2022-08-11 ENCOUNTER — Other Ambulatory Visit: Payer: Self-pay

## 2022-08-11 ENCOUNTER — Emergency Department: Payer: Self-pay

## 2022-08-11 ENCOUNTER — Emergency Department
Admission: EM | Admit: 2022-08-11 | Discharge: 2022-08-11 | Disposition: A | Discharge status: Home / Self Care | Payer: Self-pay | Attending: Emergency Medicine | Admitting: Emergency Medicine

## 2022-08-11 ENCOUNTER — Other Ambulatory Visit (INDEPENDENT_AMBULATORY_CARE_PROVIDER_SITE_OTHER): Payer: Self-pay | Admitting: Nurse Practitioner

## 2022-08-11 ENCOUNTER — Encounter
Admission: EM | Disposition: A | Discharge status: Home / Self Care | Payer: Self-pay | Source: Home / Self Care | Attending: Emergency Medicine

## 2022-08-11 DIAGNOSIS — M1612 Unilateral primary osteoarthritis, left hip: Secondary | ICD-10-CM | POA: Insufficient documentation

## 2022-08-11 DIAGNOSIS — I132 Hypertensive heart and chronic kidney disease with heart failure and with stage 5 chronic kidney disease, or end stage renal disease: Secondary | ICD-10-CM | POA: Insufficient documentation

## 2022-08-11 DIAGNOSIS — N186 End stage renal disease: Secondary | ICD-10-CM | POA: Insufficient documentation

## 2022-08-11 DIAGNOSIS — Z992 Dependence on renal dialysis: Secondary | ICD-10-CM | POA: Insufficient documentation

## 2022-08-11 DIAGNOSIS — E1122 Type 2 diabetes mellitus with diabetic chronic kidney disease: Secondary | ICD-10-CM | POA: Insufficient documentation

## 2022-08-11 DIAGNOSIS — I5022 Chronic systolic (congestive) heart failure: Secondary | ICD-10-CM | POA: Insufficient documentation

## 2022-08-11 DIAGNOSIS — T82898A Other specified complication of vascular prosthetic devices, implants and grafts, initial encounter: Secondary | ICD-10-CM

## 2022-08-11 DIAGNOSIS — T82848A Pain from vascular prosthetic devices, implants and grafts, initial encounter: Secondary | ICD-10-CM | POA: Insufficient documentation

## 2022-08-11 DIAGNOSIS — Y841 Kidney dialysis as the cause of abnormal reaction of the patient, or of later complication, without mention of misadventure at the time of the procedure: Secondary | ICD-10-CM | POA: Insufficient documentation

## 2022-08-11 DIAGNOSIS — Z4889 Encounter for other specified surgical aftercare: Secondary | ICD-10-CM

## 2022-08-11 DIAGNOSIS — N2581 Secondary hyperparathyroidism of renal origin: Secondary | ICD-10-CM | POA: Insufficient documentation

## 2022-08-11 DIAGNOSIS — Z794 Long term (current) use of insulin: Secondary | ICD-10-CM | POA: Insufficient documentation

## 2022-08-11 DIAGNOSIS — M25552 Pain in left hip: Secondary | ICD-10-CM

## 2022-08-11 DIAGNOSIS — D631 Anemia in chronic kidney disease: Secondary | ICD-10-CM | POA: Insufficient documentation

## 2022-08-11 DIAGNOSIS — Z4801 Encounter for change or removal of surgical wound dressing: Secondary | ICD-10-CM | POA: Insufficient documentation

## 2022-08-11 HISTORY — PX: DIALYSIS/PERMA CATHETER INSERTION: CATH118288

## 2022-08-11 LAB — CBC WITH DIFFERENTIAL/PLATELET
Abs Immature Granulocytes: 0.02 10*3/uL (ref 0.00–0.07)
Basophils Absolute: 0 10*3/uL (ref 0.0–0.1)
Basophils Relative: 0 %
Eosinophils Absolute: 0.1 10*3/uL (ref 0.0–0.5)
Eosinophils Relative: 1 %
HCT: 34.4 % — ABNORMAL LOW (ref 39.0–52.0)
Hemoglobin: 12.1 g/dL — ABNORMAL LOW (ref 13.0–17.0)
Immature Granulocytes: 0 %
Lymphocytes Relative: 13 %
Lymphs Abs: 0.7 10*3/uL (ref 0.7–4.0)
MCH: 31.7 pg (ref 26.0–34.0)
MCHC: 35.2 g/dL (ref 30.0–36.0)
MCV: 90.1 fL (ref 80.0–100.0)
Monocytes Absolute: 0.6 10*3/uL (ref 0.1–1.0)
Monocytes Relative: 11 %
Neutro Abs: 4.3 10*3/uL (ref 1.7–7.7)
Neutrophils Relative %: 75 %
Platelets: 88 10*3/uL — ABNORMAL LOW (ref 150–400)
RBC: 3.82 MIL/uL — ABNORMAL LOW (ref 4.22–5.81)
RDW: 13.7 % (ref 11.5–15.5)
Smear Review: NORMAL
WBC: 5.8 10*3/uL (ref 4.0–10.5)
nRBC: 0 % (ref 0.0–0.2)

## 2022-08-11 LAB — COMPREHENSIVE METABOLIC PANEL
ALT: 10 U/L (ref 0–44)
AST: 12 U/L — ABNORMAL LOW (ref 15–41)
Albumin: 3.3 g/dL — ABNORMAL LOW (ref 3.5–5.0)
Alkaline Phosphatase: 113 U/L (ref 38–126)
Anion gap: 18 — ABNORMAL HIGH (ref 5–15)
BUN: 111 mg/dL — ABNORMAL HIGH (ref 8–23)
CO2: 19 mmol/L — ABNORMAL LOW (ref 22–32)
Calcium: 8.9 mg/dL (ref 8.9–10.3)
Chloride: 97 mmol/L — ABNORMAL LOW (ref 98–111)
Creatinine, Ser: 14.85 mg/dL — ABNORMAL HIGH (ref 0.61–1.24)
GFR, Estimated: 3 mL/min — ABNORMAL LOW (ref >60)
Glucose, Bld: 142 mg/dL — ABNORMAL HIGH (ref 70–99)
Potassium: 5.1 mmol/L (ref 3.5–5.1)
Sodium: 134 mmol/L — ABNORMAL LOW (ref 135–145)
Total Bilirubin: 0.7 mg/dL (ref 0.3–1.2)
Total Protein: 7.1 g/dL (ref 6.5–8.1)

## 2022-08-11 LAB — CBG MONITORING, ED: Glucose-Capillary: 91 mg/dL (ref 70–99)

## 2022-08-11 LAB — PROCALCITONIN: Procalcitonin: 0.59 ng/mL

## 2022-08-11 SURGERY — DIALYSIS/PERMA CATHETER INSERTION
Anesthesia: Moderate Sedation

## 2022-08-11 SURGERY — DIALYSIS/PERMA CATHETER INSERTION
Anesthesia: Moderate Sedation | Laterality: Right

## 2022-08-11 MED ORDER — MIDAZOLAM HCL 2 MG/ML PO SYRP: 8.0000 mg | ORAL_SOLUTION | Freq: Once | ORAL | Status: DC | PRN

## 2022-08-11 MED ORDER — CEFAZOLIN SODIUM-DEXTROSE 1-4 GM/50ML-% IV SOLN
INTRAVENOUS | Status: AC
  Filled 2022-08-11: qty 50

## 2022-08-11 MED ORDER — PENTAFLUOROPROP-TETRAFLUOROETH EX AERO: 1.0000 | INHALATION_SPRAY | CUTANEOUS | Status: DC | PRN

## 2022-08-11 MED ORDER — FAMOTIDINE 20 MG PO TABS: 40.0000 mg | ORAL_TABLET | Freq: Once | ORAL | Status: DC | PRN

## 2022-08-11 MED ORDER — MIDAZOLAM HCL 5 MG/5ML IJ SOLN
INTRAMUSCULAR | Status: AC
  Filled 2022-08-11: qty 5

## 2022-08-11 MED ORDER — FENTANYL CITRATE PF 50 MCG/ML IJ SOSY
PREFILLED_SYRINGE | INTRAMUSCULAR | Status: AC
  Filled 2022-08-11: qty 2

## 2022-08-11 MED ORDER — CEFAZOLIN SODIUM-DEXTROSE 1-4 GM/50ML-% IV SOLN: 1.0000 g | INTRAVENOUS | Status: DC

## 2022-08-11 MED ORDER — ANTICOAGULANT SODIUM CITRATE 4% (200MG/5ML) IV SOLN: 5.0000 mL | Status: DC | PRN

## 2022-08-11 MED ORDER — ONDANSETRON HCL 4 MG/2ML IJ SOLN: 4.0000 mg | Freq: Four times a day (QID) | INTRAMUSCULAR | Status: DC | PRN

## 2022-08-11 MED ORDER — LIDOCAINE-PRILOCAINE 2.5-2.5 % EX CREA: 1.0000 | TOPICAL_CREAM | CUTANEOUS | Status: DC | PRN

## 2022-08-11 MED ORDER — DIPHENHYDRAMINE HCL 50 MG/ML IJ SOLN: 50.0000 mg | Freq: Once | INTRAMUSCULAR | Status: DC | PRN

## 2022-08-11 MED ORDER — HEPARIN SODIUM (PORCINE) 1000 UNIT/ML DIALYSIS: 1000.0000 [IU] | INTRAMUSCULAR | Status: DC | PRN

## 2022-08-11 MED ORDER — CEFAZOLIN SODIUM-DEXTROSE 2-4 GM/100ML-% IV SOLN
INTRAVENOUS | Status: DC | PRN
  Administered 2022-08-11: 1 g via INTRAVENOUS

## 2022-08-11 MED ORDER — HEPARIN SODIUM (PORCINE) 10000 UNIT/ML IJ SOLN
INTRAMUSCULAR | Status: AC
  Filled 2022-08-11: qty 1

## 2022-08-11 MED ORDER — MIDAZOLAM HCL 2 MG/2ML IJ SOLN
INTRAMUSCULAR | Status: DC | PRN
  Administered 2022-08-11: 1 mg via INTRAVENOUS

## 2022-08-11 MED ORDER — LIDOCAINE HCL (PF) 1 % IJ SOLN: 5.0000 mL | INTRAMUSCULAR | Status: DC | PRN

## 2022-08-11 MED ORDER — METHYLPREDNISOLONE SODIUM SUCC 125 MG IJ SOLR: 125.0000 mg | Freq: Once | INTRAMUSCULAR | Status: DC | PRN

## 2022-08-11 MED ORDER — ALTEPLASE 2 MG IJ SOLR: 2.0000 mg | Freq: Once | INTRAMUSCULAR | Status: DC | PRN

## 2022-08-11 MED ORDER — CHLORHEXIDINE GLUCONATE CLOTH 2 % EX PADS
6.0000 | MEDICATED_PAD | Freq: Every day | CUTANEOUS | Status: DC
  Filled 2022-08-11: qty 6

## 2022-08-11 MED ORDER — SODIUM CHLORIDE 0.9 % IV SOLN: INTRAVENOUS | Status: DC

## 2022-08-11 MED ORDER — FENTANYL CITRATE (PF) 100 MCG/2ML IJ SOLN
INTRAMUSCULAR | Status: DC | PRN
  Administered 2022-08-11: 50 ug via INTRAVENOUS

## 2022-08-11 MED ORDER — HYDROMORPHONE HCL 1 MG/ML IJ SOLN: 1.0000 mg | Freq: Once | INTRAMUSCULAR | Status: DC | PRN

## 2022-08-11 SURGICAL SUPPLY — 9 items
ADH SKN CLS APL DERMABOND .7 (GAUZE/BANDAGES/DRESSINGS) ×1
BIOPATCH RED 1 DISK 7.0 (GAUZE/BANDAGES/DRESSINGS) IMPLANT
CANNULA 5F STIFF (CANNULA) IMPLANT
CATH PALINDROME-P 19CM W/VT (CATHETERS) IMPLANT
COVER PROBE ULTRASOUND 5X96 (MISCELLANEOUS) IMPLANT
DERMABOND ADVANCED .7 DNX12 (GAUZE/BANDAGES/DRESSINGS) IMPLANT
PACK ANGIOGRAPHY (CUSTOM PROCEDURE TRAY) IMPLANT
SUT MNCRL AB 4-0 PS2 18 (SUTURE) IMPLANT
SUT PROLENE 0 CT 1 30 (SUTURE) IMPLANT

## 2022-08-11 NOTE — ED Triage Notes (Signed)
See note on 08/11/2022 at 1806.

## 2022-08-11 NOTE — Discharge Instructions (Signed)
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  You have temporary dialysis access placed.  Please go to dialysis tomorrow.

## 2022-08-11 NOTE — ED Triage Notes (Signed)
First nurse Note:  Right chest dialysis catheter placed today.  Arrives to ED due to bleeding around site.   Dressings in place.  Blood seen to upper dressing, no active bleeding appreciated at this time.

## 2022-08-11 NOTE — Progress Notes (Signed)
[place], [place] 08/11/22  Subjective:   LOS: 0  Belcher was last admitted this past weekend for Gi symptoms and 1 week of missed HD treatments. He was dialyzed at that time and treated for hyperkalemia and discharged.  He returns to the ED today from his dialysis clinic for evaluation of HD access.  Patient reports left upper arm fistula swollen, hot and painful.  Patient does have aneurysms in his access, noted on Saturday during treatment.  Current presentation has worsened since Saturday.  Labs include sodium 134, potassium 5.1, serum bicarb 19, glucose 145, BUN 111, creatinine 14.85 with GFR 3, and hemoglobin 12.1.  CT head negative.  Left hip osteoarthritis noted on x-ray.  Upper extremity access ultrasound shows multifocal aneurysms at cannulation site.  We have been consulted to evaluate dialysis needs.  Objective:  Vital signs in last 24 hours:  Temp:  [98.2 F (36.8 C)-98.7 F (37.1 C)] 98.2 F (36.8 C) (11/13 1359) Pulse Rate:  [59-72] 63 (11/13 1359) Resp:  [18] 18 (11/13 1359) BP: (129-155)/(60-74) 140/60 (11/13 1359) SpO2:  [93 %-100 %] 99 % (11/13 1359)  Weight change:  There were no vitals filed for this visit.   Intake/Output:   No intake or output data in the 24 hours ending 08/11/22 1408   Physical Exam: General: No acute distress, laying in the bed  HEENT Moist oral mucous membranes  Pulm/lungs Normal breathing effort on room air, Clear to auscultation  CVS/Heart Regular rhythm with ectopic beats  Abdomen:  Soft, nontender, nondistended  Extremities: No peripheral edema  Neurologic: Alert, oriented  Skin: No acute rashes  Access: Left upper extremity AV fistula, aneurysmal       Basic Metabolic Panel:  Recent Labs  Lab 08/09/22 0630 08/11/22 0753  NA 131* 134*  K 6.8* 5.1  CL 98 97*  CO2 11* 19*  GLUCOSE 141* 142*  BUN 144* 111*  CREATININE 18.54* 14.85*  CALCIUM 8.6* 8.9  MG 3.2*  --        CBC: Recent Labs  Lab 08/09/22 0630 08/11/22 0753  WBC 5.6 5.8  NEUTROABS 4.1 4.3  HGB 14.7 12.1*  HCT 42.5 34.4*  MCV 91.2 90.1  PLT 88* 88*       Lab Results  Component Value Date   HEPBSAG NON REACTIVE 08/09/2022   HEPBSAB Non Reactive 09/22/2017      Microbiology:  No results found for this or any previous visit (from the past 240 hour(s)).  Coagulation Studies: No results for input(s): "LABPROT", "INR" in the last 72 hours.  Urinalysis: No results for input(s): "COLORURINE", "LABSPEC", "PHURINE", "GLUCOSEU", "HGBUR", "BILIRUBINUR", "KETONESUR", "PROTEINUR", "UROBILINOGEN", "NITRITE", "LEUKOCYTESUR" in the last 72 hours.  Invalid input(s): "APPERANCEUR"    Imaging: [place] UPPER EXTREMITY ARTERIAL LEFT LIMITED (GRAFT,SINGLE VESSEL)  Result Date: 08/11/2022 CLINICAL DATA:  Arm swelling. History of LEFT upper extremity brachiocephalic graft with pain and swelling EXAM: LEFT UPPER EXTREMITY ARTERIAL DUPLEX SCAN, LIMITED TECHNIQUE: Gray-scale sonography as well as color Doppler and duplex ultrasound was performed to evaluate the arteries of the upper extremity. COMPARISON:  LEFT upper extremity graft Doppler, 11/13/2015. FINDINGS: Focused ultrasound at the area of concern, along the LEFT upper extremity dialysis graft. Images demonstrating patent graft, with multifocal (2) aneurysms, along the cannulation segment with nonocclusive thrombus. To and fro flow, consistent with patency. Patent arterial anastomosis and venous outflow. A complete duplex evaluation, including flow velocities was not performed. IMPRESSION: Suboptimal evaluation, within these constraints; 1.  LEFT upper extremity dialysis graft, widely patent. 2. 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 Michaelle Birks, MD [facility] Radiology Electronically Signed   By: Michaelle Birks M.D.   On: 08/11/2022 10:28   CT HEAD WO  CONTRAST (5MM)  Result Date: 08/11/2022 CLINICAL DATA:  Provided history: Head trauma, minor. EXAM: CT HEAD WITHOUT CONTRAST TECHNIQUE: Contiguous axial images were obtained from the base of the skull through the vertex without intravenous contrast. RADIATION DOSE REDUCTION: This exam was performed according to the departmental dose-optimization program which includes automated exposure control, adjustment of the mA and/or kV according to patient size and/or use of iterative reconstruction technique. COMPARISON:  Brain MRI 04/09/2022.  Head CT 04/09/2022. FINDINGS: Brain: Mild generalized cerebral atrophy. There is no acute intracranial hemorrhage. No demarcated cortical infarct. No extra-axial fluid collection. No evidence of an intracranial mass. No midline shift. Vascular: No hyperdense vessel.  Atherosclerotic calcifications. Skull: No fracture or aggressive osseous lesion. Sinuses/Orbits: No mass or acute finding within the imaged orbits. Chronic medially displaced fracture deformity of the left lamina papyracea. Minimal mucosal thickening scattered within the bilateral ethmoid air cells. IMPRESSION: No evidence of acute intracranial abnormality. Mild generalized cerebral atrophy. Electronically Signed   By: Kellie Simmering D.O.   On: 08/11/2022 08:17   DG HIP UNILAT WITH PELVIS 2-3 VIEWS LEFT  Result Date: 08/11/2022 CLINICAL DATA:  Fall.  Left hip pain. EXAM: DG HIP (WITH OR WITHOUT PELVIS) 2-3V LEFT COMPARISON:  None Available. FINDINGS: There is no evidence of hip fracture or dislocation. Mild osteoarthritis noted with geode formation along the acetabular roof. No other bone lesions identified. Peripheral vascular calcification noted. IMPRESSION: No acute findings. Mild left hip osteoarthritis. Electronically Signed   By: Marlaine Hind M.D.   On: 08/11/2022 08:14   DG Hip Unilat W or Wo Pelvis 2-3 Views Right  Result Date: 08/11/2022 CLINICAL DATA:  Fall.  Right hip pain. EXAM: DG HIP (WITH OR  WITHOUT PELVIS) 2-3V RIGHT COMPARISON:  None Available. FINDINGS: There is no evidence of hip fracture or dislocation. There is no evidence of arthropathy or other focal bone abnormality. Peripheral vascular calcification noted. IMPRESSION: No acute findings. Peripheral vascular calcification. Electronically Signed   By: Marlaine Hind M.D.   On: 08/11/2022 08:13     Medications:    sodium chloride     anticoagulant sodium citrate      ceFAZolin (ANCEF) IV      Chlorhexidine Gluconate Cloth  6 each Topical Q0600     Assessment/ Plan:  66 y.o. male with end-stage renal disease, diabetes, hypertension, chronic systolic CHF EF 35 to 29% in 05/19/2019, history of depression was admitted on 08/11/2022 for  Active Problems:   * No active hospital problems. *  stent swelling  #.  Malfunction hemodialysis access with ESRD  [facility] nephrology/[facility]/IV iron Last treatment received inpatient Saturday, tolerated well.  Unable to attempt treatment at outpatient clinic this morning due to discomfort.  Recommend vascular consult to evaluate and possibly place PermCath.  Renal navigator has secured outpatient dialysis chair for treatment tomorrow morning.  Patient has agreed to this arrangement.  #. Anemia of CKD  Lab Results  Component Value Date   HGB 12.1 (L) 08/11/2022   Hemoglobin within desired target.  #. Secondary hyperparathyroidism of renal origin N 25.81      Component Value Date/Time   PTH 359 (H) 08/01/2019 1710   Lab Results  Component Value Date  PHOS 5.3 (H) 02/20/2021   Calcium 8.9, at goal.  We will continue to monitor.  Patient prescribed calcium acetate with meals outpatient.   #. Diabetes type 2 with CKD Hgb A1c MFr Bld (%)  Date Value  08/09/2022 6.5 (H)  Outpatient managed with NovoLog 70/30.   LOS: 0 [facility] 11/13/20232:08 PM  [place], [place]

## 2022-08-11 NOTE — Consult Note (Signed)
[facility] VASC[facility] VEIN SPECIALISTS Vascular Consult Note  MRN : [id number]  Danny [name] is a 66 y.o. ([birth year]) male who presents with chief complaint of  Chief Complaint  Patient presents with   Vascular Access Problem  .   Consulting Physician: Acquanetta Belling Reason for consult: Temporary Placement of Dialysis  Perma Cath History of Present Illness:  Danny [name] was last admitted this past weekend for Gi symptoms and 1 week of missed HD treatments. He was dialyzed at that time and treated for hyperkalemia and discharged.   He returns to the ED today from his dialysis clinic for evaluation of HD access.  Patient reports left upper arm fistula swollen, hot and painful.  Skin overlying his access is blanched. Patient states they were unable to gain access from his AV fistula. Patient does have aneurysms in his access.  Current presentation has worsened since Saturday. The upper extremity/Ultrasound of his fistula shows multifocal aneurysms at the cannulation site.   Discussed in detail the need for a temporary Hemodialysis Perma Cath placement today. All questions were answered with his wife at the bedside and his daughter on the phone.  Current Facility-Administered Medications  Medication Dose Route Frequency Provider Last Rate Last Admin   0.9 %  sodium chloride infusion   Intravenous Continuous Eulogio Ditch E, NP       alteplase (CATHFLO ACTIVASE) injection 2 mg  2 mg Intracatheter Once PRN Colon Flattery, NP       anticoagulant sodium citrate solution 5 mL  5 mL Intracatheter PRN Colon Flattery, NP       ceFAZolin (ANCEF) 1-4 GM/50ML-% IVPB            ceFAZolin (ANCEF) IVPB 1 g/50 mL premix  1 g Intravenous 30 min Pre-Op Kris Hartmann, NP       Chlorhexidine Gluconate Cloth 2 % PADS 6 each  6 each Topical Q0600 Colon Flattery, NP       diphenhydrAMINE (BENADRYL) injection 50 mg  50 mg Intravenous Once PRN Kris Hartmann, NP        famotidine (PEPCID) tablet 40 mg  40 mg Oral Once PRN Kris Hartmann, NP       fentaNYL (SUBLIMAZE) 50 MCG/ML injection            heparin 10000 UNIT/ML injection            heparin injection 1,000 Units  1,000 Units Intracatheter PRN Colon Flattery, NP       HYDROmorphone (DILAUDID) injection 1 mg  1 mg Intravenous Once PRN Eulogio Ditch E, NP       lidocaine (PF) (XYLOCAINE) 1 % injection 5 mL  5 mL Intradermal PRN Colon Flattery, NP       lidocaine-prilocaine (EMLA) cream 1 Application  1 Application Topical PRN Colon Flattery, NP       methylPREDNISolone sodium succinate (SOLU-MEDROL) 125 mg/2 mL injection 125 mg  125 mg Intravenous Once PRN Kris Hartmann, NP       midazolam (VERSED) 2 MG/ML syrup 8 mg  8 mg Oral Once PRN Kris Hartmann, NP       midazolam (VERSED) 5 MG/5ML injection            ondansetron (ZOFRAN) injection 4 mg  4 mg Intravenous Q6H PRN Kris Hartmann, NP       pentafluoroprop-tetrafluoroeth (GEBAUERS) aerosol 1 Application  1 Application Topical PRN Colon Flattery, NP       Current  Outpatient Medications  Medication Sig Dispense Refill   albuterol (VENTOLIN HFA) 108 (90 Base) MCG/ACT inhaler Inhale 2 puffs into the lungs every 6 (six) hours as needed for wheezing.     amLODipine (NORVASC) 5 MG tablet Take 5 mg by mouth at bedtime.     aspirin 81 MG EC tablet Take 1 tablet by mouth daily.     calcitRIOL (ROCALTROL) 0.25 MCG capsule Take 1 capsule by mouth daily.     calcium acetate (PHOSLO) 667 MG capsule Take 2,001 mg by mouth 3 (three) times daily with meals.      carvedilol (COREG) 3.125 MG tablet Take 3.125 mg by mouth 2 (two) times daily with a meal.     cinacalcet (SENSIPAR) 30 MG tablet Take 1 tablet by mouth daily. (Patient not taking: Reported on 08/09/2022)     citalopram (CELEXA) 20 MG tablet Take 20 mg by mouth daily.  (Patient not taking: Reported on 02/19/2021)     furosemide (LASIX) 40 MG tablet Take 1 tablet by mouth daily.     gabapentin  (NEURONTIN) 300 MG capsule Take 300 mg by mouth daily.     insulin aspart protamine- aspart (NOVOLOG MIX 70/30) (70-30) 100 UNIT/ML injection Inject 0.05-0.1 mLs (5-10 Units total) into the skin See admin instructions. 5 units every morning and 4 units every evening 10 mL 11   lidocaine (LIDODERM) 5 % Place 1 patch onto the skin every 12 (twelve) hours. Remove & Discard patch within 12 hours or as directed by MD 10 patch 0   lisinopril (ZESTRIL) 20 MG tablet Take 0.5 tablets (10 mg total) by mouth daily.     loperamide (IMODIUM) 2 MG capsule Take 1 capsule (2 mg total) by mouth every 6 (six) hours as needed for diarrhea or loose stools. 30 capsule 0   multivitamin (RENA-VIT) TABS tablet Take 1 tablet by mouth at bedtime. (Patient not taking: Reported on 02/19/2021) 30 tablet 0   Nutritional Supplements (FEEDING SUPPLEMENT, NEPRO CARB STEADY,) LIQD Take 237 mLs by mouth 2 (two) times daily between meals. 237 mL 0   omeprazole (PRILOSEC OTC) 20 MG tablet Take 20 mg by mouth daily.     oxyCODONE-acetaminophen (PERCOCET) 5-325 MG tablet Take 1 tablet by mouth every 4 (four) hours as needed for severe pain. 8 tablet 0   sevelamer carbonate (RENVELA) 800 MG tablet Take 800-3,200 mg by mouth See admin instructions. Take 4 tablets (3200mg ) by mouth three times daily with meals (Patient not taking: Reported on 08/09/2022)     sodium zirconium cyclosilicate (LOKELMA) 5 g packet Take 5 g by mouth daily. (Patient not taking: Reported on 08/09/2022) 30 packet 1   traZODone (DESYREL) 50 MG tablet Take 1 tablet by mouth at bedtime.      Past Medical History:  Diagnosis Date   Diabetes mellitus without complication [facility])    Dialysis patient ([place])    tuesday, thursday and saturday   Gout    Hypertension    Kidney disease     Past Surgical History:  Procedure Laterality Date   AV FISTULA PLACEMENT     miscellaneous     peritoneal dialysis catheter placement and removal    Social History Social History    Tobacco Use   Smoking status: Never   Smokeless tobacco: Never  Substance Use Topics   Alcohol use: No   Drug use: No    Family History Family History  Problem Relation Age of Onset   Diabetes Mother  Prostate cancer Father    Kidney disease Sister     No Known Allergies   REVIEW OF SYSTEMS (Negative unless checked)  Constitutional: [] Weight loss  [] Fever  [] Chills Cardiac: [] Chest pain   [] Chest pressure   [] Palpitations   [] Shortness of breath when laying flat   [] Shortness of breath at rest   [] Shortness of breath with exertion. Vascular:  [] Pain in legs with walking   [] Pain in legs at rest   [] Pain in legs when laying flat   [] Claudication   [] Pain in feet when walking  [] Pain in feet at rest  [] Pain in feet when laying flat   [] History of DVT   [] Phlebitis   [] Swelling in legs   [] Varicose veins   [] Non-healing ulcers Pulmonary:   [] Uses home oxygen   [] Productive cough   [] Hemoptysis   [] Wheeze  [] COPD   [] Asthma Neurologic:  [] Dizziness  [] Blackouts   [] Seizures   [] History of stroke   [] History of TIA  [] Aphasia   [] Temporary blindness   [] Dysphagia   [] Weakness or numbness in arms   [] Weakness or numbness in legs Musculoskeletal:  [] Arthritis   [] Joint swelling   [] Joint pain   [] Low back pain Hematologic:  [] Easy bruising  [] Easy bleeding   [] Hypercoagulable state   [] Anemic  [] Hepatitis Gastrointestinal:  [] Blood in stool   [] Vomiting blood  [] Gastroesophageal reflux/heartburn   [] Difficulty swallowing. Genitourinary:  [x] Chronic kidney disease   [] Difficult urination  [] Frequent urination  [] Burning with urination   [] Blood in urine Skin:  [] Rashes   [] Ulcers   [] Wounds Psychological:  [] History of anxiety   []  History of major depression.  Physical Examination  Vitals:   08/11/22 1124 08/11/22 1200 08/11/22 1300 08/11/22 1359  BP:  (!) 147/67 134/66 (!) 140/60  Pulse:  64 62 63  Resp:  18 18 18   Temp: 98.3 F (36.8 C)   98.2 F (36.8 C)  TempSrc: Oral    Oral  SpO2:  99% 94% 99%   There is no height or weight on file to calculate BMI. Gen:  WD/WN, NAD Head: [place]/AT, No temporalis wasting. Prominent temp pulse not noted. Ear/Nose/Throat: Hearing grossly intact, nares w/o erythema or drainage, oropharynx w/o Erythema/Exudate Eyes: Sclera non-icteric, conjunctiva clear Neck: Trachea midline.  No JVD.  Pulmonary:  Good air movement, respirations not labored, equal bilaterally.  Cardiac: RRR, normal S1, S2. Vascular: Multi Aneurysmal hemodialysis AV fistula. Warm to palpation with positive pulses. Unable to access at his dialysis center this morning.  Vessel Right Left  Radial Palpable Palpable  Ulnar Palpable Palpable  Brachial Palpable Palpable  Carotid Palpable, without bruit Palpable, without bruit  Aorta Not palpable N/A  Femoral Palpable Palpable  Popliteal Palpable Palpable  PT Palpable Palpable  DP Palpable Palpable   Gastrointestinal: soft, non-tender/non-distended. No guarding/reflex.  Musculoskeletal: M/S 5/5 throughout.  Extremities without ischemic changes.  No deformity or atrophy. No edema. Neurologic: Sensation grossly intact in extremities.  Symmetrical.  Speech is fluent. Motor exam as listed above. Psychiatric: Judgment intact, Mood & affect appropriate for pt's clinical situation. Dermatologic: No rashes or ulcers noted.  No cellulitis or open wounds. Lymph : No Cervical, Axillary, or Inguinal lymphadenopathy.    CBC Lab Results  Component Value Date   WBC 5.8 08/11/2022   HGB 12.1 (L) 08/11/2022   HCT 34.4 (L) 08/11/2022   MCV 90.1 08/11/2022   PLT 88 (L) 08/11/2022    BMET    Component Value Date/Time   NA 134 (L)  08/11/2022 0753   NA 128 (L) 09/10/2013 2125   K 5.1 08/11/2022 0753   K 3.4 (L) 09/10/2013 2125   CL 97 (L) 08/11/2022 0753   CL 94 (L) 09/10/2013 2125   CO2 19 (L) 08/11/2022 0753   CO2 25 09/10/2013 2125   GLUCOSE 142 (H) 08/11/2022 0753   GLUCOSE 479 (H) 09/10/2013 2125   BUN 111 (H)  08/11/2022 0753   BUN 43 (H) 09/10/2013 2125   CREATININE 14.85 (H) 08/11/2022 0753   CREATININE 2.47 (H) 09/10/2013 2125   CALCIUM 8.9 08/11/2022 0753   CALCIUM 8.5 09/10/2013 2125   GFRNONAA 3 (L) 08/11/2022 0753   GFRNONAA 28 (L) 09/10/2013 2125   GFRAA 9 (L) 02/19/2020 1322   GFRAA 32 (L) 09/10/2013 2125   Estimated Creatinine Clearance: 5.4 mL/min (A) (by C-G formula based on SCr of 14.85 mg/dL (H)).  COAG Lab Results  Component Value Date   INR 1.2 02/20/2021   INR 1.2 02/19/2021   INR 1.2 08/01/2019    Radiology [place] UPPER EXTREMITY ARTERIAL LEFT LIMITED (GRAFT,SINGLE VESSEL)  Result Date: 08/11/2022 CLINICAL DATA:  Arm swelling. History of LEFT upper extremity brachiocephalic graft with pain and swelling EXAM: LEFT UPPER EXTREMITY ARTERIAL DUPLEX SCAN, LIMITED TECHNIQUE: Gray-scale sonography as well as color Doppler and duplex ultrasound was performed to evaluate the arteries of the upper extremity. COMPARISON:  LEFT upper extremity graft Doppler, 11/13/2015. FINDINGS: Focused ultrasound at the area of concern, along the LEFT upper extremity dialysis graft. Images demonstrating patent graft, with multifocal (2) aneurysms, along the cannulation segment with nonocclusive thrombus. To and fro flow, consistent with patency. Patent arterial anastomosis and venous outflow. A complete duplex evaluation, including flow velocities was not performed. IMPRESSION: Suboptimal evaluation, within these constraints; 1. LEFT upper extremity dialysis graft, widely patent. 2. 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 Michaelle Birks, MD [facility] Radiology Electronically Signed   By: Michaelle Birks M.D.   On: 08/11/2022 10:28   CT HEAD WO CONTRAST (5MM)  Result Date: 08/11/2022 CLINICAL DATA:  Provided history: Head trauma, minor. EXAM: CT HEAD WITHOUT CONTRAST TECHNIQUE: Contiguous axial images were obtained from  the base of the skull through the vertex without intravenous contrast. RADIATION DOSE REDUCTION: This exam was performed according to the departmental dose-optimization program which includes automated exposure control, adjustment of the mA and/or kV according to patient size and/or use of iterative reconstruction technique. COMPARISON:  Brain MRI 04/09/2022.  Head CT 04/09/2022. FINDINGS: Brain: Mild generalized cerebral atrophy. There is no acute intracranial hemorrhage. No demarcated cortical infarct. No extra-axial fluid collection. No evidence of an intracranial mass. No midline shift. Vascular: No hyperdense vessel.  Atherosclerotic calcifications. Skull: No fracture or aggressive osseous lesion. Sinuses/Orbits: No mass or acute finding within the imaged orbits. Chronic medially displaced fracture deformity of the left lamina papyracea. Minimal mucosal thickening scattered within the bilateral ethmoid air cells. IMPRESSION: No evidence of acute intracranial abnormality. Mild generalized cerebral atrophy. Electronically Signed   By: Kellie Simmering D.O.   On: 08/11/2022 08:17   DG HIP UNILAT WITH PELVIS 2-3 VIEWS LEFT  Result Date: 08/11/2022 CLINICAL DATA:  Fall.  Left hip pain. EXAM: DG HIP (WITH OR WITHOUT PELVIS) 2-3V LEFT COMPARISON:  None Available. FINDINGS: There is no evidence of hip fracture or dislocation. Mild osteoarthritis noted with geode formation along the acetabular roof. No other bone lesions identified. Peripheral vascular calcification noted. IMPRESSION: No acute findings. Mild left hip osteoarthritis.  Electronically Signed   By: Marlaine Hind M.D.   On: 08/11/2022 08:14   DG Hip Unilat W or Wo Pelvis 2-3 Views Right  Result Date: 08/11/2022 CLINICAL DATA:  Fall.  Right hip pain. EXAM: DG HIP (WITH OR WITHOUT PELVIS) 2-3V RIGHT COMPARISON:  None Available. FINDINGS: There is no evidence of hip fracture or dislocation. There is no evidence of arthropathy or other focal bone  abnormality. Peripheral vascular calcification noted. IMPRESSION: No acute findings. Peripheral vascular calcification. Electronically Signed   By: Marlaine Hind M.D.   On: 08/11/2022 08:13   DG Chest Portable 1 View  Result Date: 08/09/2022 CLINICAL DATA:  Missed dialysis for 1 week.  Question edema. EXAM: PORTABLE CHEST 1 VIEW COMPARISON:  01/30/2022 FINDINGS: The lungs are clear without focal pneumonia, edema, pneumothorax or pleural effusion. The cardiopericardial silhouette is within normal limits for size. Mild vascular congestion noted. The visualized bony structures of the thorax are unremarkable. Telemetry leads overlie the chest. IMPRESSION: Mild vascular congestion without pulmonary edema. No acute findings. Electronically Signed   By: Misty Stanley M.D.   On: 08/09/2022 06:54      Assessment/Plan  Malfunction hemodialysis access with ESRD:  Patient scheduled today for placement of temporary Hemodialysis Perma Cath in the vascular lab later today. Risks and complications of the procedure were explained. The need for the procedure was explained. All family questions were answered. Patient verbalizes his willingness to proceed. Patient may be discharged home after the procedure if the primary team wishes to do so.   2.  Diabetes type 2 with CKD:   Continue hypoglycemic medications as already ordered, these medications have been reviewed and there are no changes at this time.  Hgb A1C to be monitored as already arranged by primary service.     Plan of care discussed with Dr Leotis Pain and he is in agreement with plan noted above.   Family Communication:  Total Time:55 I spent 55 minutes in this encounter including personally reviewing extensive medical records, personally reviewing imaging studies and compared to prior scans, counseling the patient, placing orders, coordinating care and performing appropriate documentation  Thank you for allowing [place] to participate in the care of  this patient.   Drema Pry, NP [facility] [phone] (Office Phone) [phone] (Office Fax) [phone] (Pager)  08/11/2022 2:51 PM  Staff may message me via secure chat in [place]  but this may not receive immediate response,  please page for urgent matters!  Dictation software was used to generate the above note. Typos may occur and escape review, as with typed/written notes. Any error is purely unintentional.  Please contact me directly for clarity if needed.

## 2022-08-11 NOTE — ED Provider Notes (Addendum)
[facility] Provider Note    Event Date/Time   First MD Initiated Contact with Patient 08/11/22 [phone]     (approximate)   History   Vascular Access Problem   HPI  [facility] Must Avila-Nieto is a 66 y.o. male past medical history of end-stage renal disease who presents because of swelling redness and pain to his left AV fistula.  Patient had missed dialysis last week due to feeling generally unwell with nausea vomiting and diarrhea.  Presented to the ED on Saturday was found to have hyperkalemia underwent urgent dialysis.  Patient notes that since having the dialysis fistula accessed on Saturday he is noted pain to the arm.  Has become progressively swollen and red.  He also endorses chills.  Nausea vomiting diarrhea have improved.  He went to dialysis today but was unable to get dialysis because of how swollen the arm was and was told to come to the emergency department.  Patient does endorse frequent falls over the last several days.     Past Medical History:  Diagnosis Date   Diabetes mellitus without complication ([place])    Dialysis patient ([place])    tuesday, thursday and saturday   Gout    Hypertension    Kidney disease     Patient Active Problem List   Diagnosis Date Noted   Hemodialysis AV fistula aneurysm, initial encounter ([place]) 08/11/2022   Nausea vomiting and diarrhea 02/19/2021   Depression 02/19/2021   Abdominal pain 02/19/2021   Elevated troponin 02/19/2021   Fall at home, initial encounter 02/19/2021   ESRD (end stage renal disease) on dialysis ([place]) 93/81/0175   Chronic systolic CHF (congestive heart failure) ([place]) 02/19/2021   Diabetes mellitus with ESRD (end-stage renal disease) ([place]) 08/01/2019   Diarrhea 08/01/2019   Hypertension    Chronic combined systolic and diastolic CHF (congestive heart failure) ([facility])    Volume overload    Acute diarrhea 07/27/2018   Viral gastroenteritis 10/04/2016   Anemia 10/04/2016   Thrombocytopenia  ([place]) 10/04/2016   Rectal bleeding 10/04/2016   Hyperkalemia 10/02/2016   Pulmonary edema 05/13/2016     Physical Exam  Triage Vital Signs: ED Triage Vitals [08/11/22 0653]  Enc Vitals Group     BP (!) 155/74     Pulse Rate 72     Resp 18     Temp 98.7 F (37.1 C)     Temp Source Oral     SpO2 100 %     Weight      Height      Head Circumference      Peak Flow      Pain Score 6     Pain Loc      Pain Edu?      Excl. in [place]?     Most recent vital signs: Vitals:   08/11/22 1524 08/11/22 1530  BP: (!) 151/93 (!) 167/78  Pulse: 66 66  Resp: 16 16  Temp:    SpO2: 97% 96%     General: Awake, no distress.  CV:  Good peripheral perfusion.  Resp:  Normal effort.  Abd:  No distention.  Neuro:             Awake, Alert, Oriented x 3  Other:  Left AV fistula with significant swelling erythema tenderness to touch, no obvious fluctuance, palpable thrill, below  No C-spine tenderness, patient able to range both hips mildly tender over the right greater trochanter, able to bear weight  ED  Results / Procedures / Treatments  Labs (all labs ordered are listed, but only abnormal results are displayed) Labs Reviewed  COMPREHENSIVE METABOLIC PANEL - Abnormal; Notable for the following components:      Result Value   Sodium 134 (*)    Chloride 97 (*)    CO2 19 (*)    Glucose, Bld 142 (*)    BUN 111 (*)    Creatinine, Ser 14.85 (*)    Albumin 3.3 (*)    AST 12 (*)    GFR, Estimated 3 (*)    Anion gap 18 (*)    All other components within normal limits  CBC WITH DIFFERENTIAL/PLATELET - Abnormal; Notable for the following components:   RBC 3.82 (*)    Hemoglobin 12.1 (*)    HCT 34.4 (*)    Platelets 88 (*)    All other components within normal limits  CULTURE, BLOOD (ROUTINE X 2)  CULTURE, BLOOD (ROUTINE X 2)  PROCALCITONIN  HEPATITIS B SURFACE ANTIGEN  HEPATITIS B SURFACE ANTIBODY, QUANTITATIVE  CBG MONITORING, ED     EKG     RADIOLOGY  I reviewed and  interpreted the CT scan of the brain which does not show any acute intracranial process   PROCEDURES:  Critical Care performed: No  Procedures  The patient is on the cardiac monitor to evaluate for evidence of arrhythmia and/or significant heart rate changes.   MEDICATIONS ORDERED IN ED: Medications  Chlorhexidine Gluconate Cloth 2 % PADS 6 each ( Topical Automatically Held 08/19/22 0600)  pentafluoroprop-tetrafluoroeth (GEBAUERS) aerosol 1 Application ( Topical MAR Hold 08/11/22 1503)  lidocaine (PF) (XYLOCAINE) 1 % injection 5 mL ( Intradermal MAR Hold 08/11/22 1503)  lidocaine-prilocaine (EMLA) cream 1 Application ( Topical MAR Hold 08/11/22 1503)  heparin injection 1,000 Units ( Intracatheter MAR Hold 08/11/22 1503)  anticoagulant sodium citrate solution 5 mL ( Intracatheter MAR Hold 08/11/22 1503)  alteplase (CATHFLO ACTIVASE) injection 2 mg ( Intracatheter MAR Hold 08/11/22 1503)  ondansetron (ZOFRAN) injection 4 mg ( Intravenous MAR Hold 08/11/22 1503)  HYDROmorphone (DILAUDID) injection 1 mg ( Intravenous MAR Hold 08/11/22 1503)  ceFAZolin (ANCEF) 1-4 GM/50ML-% IVPB (has no administration in time range)  heparin 10000 UNIT/ML injection (has no administration in time range)  midazolam (VERSED) 5 MG/5ML injection (has no administration in time range)  fentaNYL (SUBLIMAZE) 50 MCG/ML injection (has no administration in time range)     IMPRESSION / MDM / ASSESSMENT AND PLAN / ED COURSE  I reviewed the triage vital signs and the nursing notes.                              Patient's presentation is most consistent with acute presentation with potential threat to life or bodily function.  Differential diagnosis includes, but is not limited to, abscess, cellulitis, pseudoaneurysm   The patient is a 66 year old male with history of end-stage renal disease who presents because of pain swelling redness that started acutely on Saturday to his left AV fistula.  Patient was  hospitalized on Saturday due to missing dialysis the week before because of a viral illness.  He underwent dialysis last on Saturday, 2 days ago.  Ever since the fistula was accessed on Saturday he has had pain swelling and redness of the left AV fistula.  He has also had chills.  Patient also endorses frequent falls.  On exam he has massive swelling to the left AV fistula with 2 discrete  large areas that are red and painful to touch is not obviously fluctuant not draining.  There is a good thrill.  And concern for potential growing aneurysm versus abscess or cellulitis.  Will obtain blood work including cultures and an ultrasound of the fistula.  Will likely need admission and discussion with both nephrology and vascular.  Dr. Lucky Cowboy evaluated the patient and recommends placing temporary dialysis access with permacath but does not feel that the aneurysm needs to be taken care of at this moment.  They will arrange outpatient follow-up.  Discussed with nephrology and they have arranged for him to have dialysis tomorrow which is reasonable given he is not having any cardiopulmonary symptoms and potassium is 5.1.  Patient's ultrasound shows an aneurysm no collection or abscess.  Suspect the skin changes are due to the enlarging aneurysm.  No indication for antibiotics at this time   FINAL CLINICAL IMPRESSION(S) / ED DIAGNOSES   Final diagnoses:  Aneurysm of arteriovenous dialysis fistula, initial encounter [facility])     Rx / DC Orders   ED Discharge Orders     None        Note:  This document was prepared using Dragon voice recognition software and may include unintentional dictation errors.   Rada Hay, MD 08/11/22 1539    Rada Hay, MD 08/11/22 1540

## 2022-08-11 NOTE — ED Triage Notes (Signed)
Pt has a fistula to the L arm that is swelling. Pt sts he went to get dialysis today and was told to come to the ER because his fistula was swollen. Pt sts it is painful and has been since he got dialysis on Saturday.

## 2022-08-11 NOTE — ED Provider Notes (Signed)
[facility] Provider Note  Patient Contact: 8:01 PM (approximate)   History   Vascular Access Problem   HPI  [facility] Danny [name] is a 66 y.o. male who presents for evaluation of a central line placement of the right chest wall.  Patient had a port placed this morning, states that it had some bleeding through his dressing.  No pain.  No shortness of breath.  Bleeding is stopped on arrival.  Patient went to ensure there is no complication of the port.  There is been no tugging or pulling on the port     Physical Exam   Triage Vital Signs: ED Triage Vitals [08/11/22 1945]  Enc Vitals Group     BP (!) 143/78     Pulse Rate 66     Resp 18     Temp 98.4 F (36.9 C)     Temp Source Oral     SpO2 99 %     Weight 200 lb 13.4 oz (91.1 kg)     Height      Head Circumference      Peak Flow      Pain Score      Pain Loc      Pain Edu?      Excl. in [place]?     Most recent vital signs: Vitals:   08/11/22 1945  BP: (!) 143/78  Pulse: 66  Resp: 18  Temp: 98.4 F (36.9 C)  SpO2: 99%     General: Alert and in no acute distress.  Neck: No stridor. No cervical spine tenderness to palpation.  Cardiovascular:  Good peripheral perfusion Respiratory: Normal respiratory effort without tachypnea or retractions. Lungs CTAB. Good air entry to the bases with no decreased or absent breath sounds. Musculoskeletal: Full range of motion to all extremities.  Neurologic:  No gross focal neurologic deficits are appreciated.  Skin:   No rash noted.  Visualization of the chest wall reveals in-place port to the right upper chest.  Patient has been on the dressing but no active bleeding.  Dressing was bloody but no active ongoing bleeding.  Dressing is halfway removed, there appears to be no dislodgment of the port.  No underlying ecchymosis or evidence of hematoma to the chest wall. Other:   ED Results / Procedures / Treatments   Labs (all labs ordered are listed,  but only abnormal results are displayed) Labs Reviewed - No data to display   EKG     RADIOLOGY    PROCEDURES:  Critical Care performed: No  Procedures   MEDICATIONS ORDERED IN ED: Medications - No data to display   IMPRESSION / MDM / Foley / ED COURSE  I reviewed the triage vital signs and the nursing notes.                              Differential diagnosis includes, but is not limited to, bleeding, postop complication, hematoma chest wall, pneumothorax  Patient's presentation is most consistent with acute presentation with potential threat to life or bodily function.   Patient's diagnosis is consistent with wound check.  Patient presents emergency department after having a vascular port placed to the chest wall.  Patient had some bleeding this evening.  This did stop prior to arrival.  No ongoing bleeding.  No evidence of disruption of the port.  Wound care instructions discussed with the patient.  No indication for labs  or imaging currently.  Return precautions discussed.  Original dressing is left in place and patient has a new dressing applied over top.  Follow-up with vascular surgery.  Again return to the ED for new or worsening symptoms..  Patient is given ED precautions to return to the ED for any worsening or new symptoms.        FINAL CLINICAL IMPRESSION(S) / ED DIAGNOSES   Final diagnoses:  Encounter for post surgical wound check     Rx / DC Orders   ED Discharge Orders     None        Note:  This document was prepared using Dragon voice recognition software and may include unintentional dictation errors.   Brynda Peon 08/11/22 2006    Vanessa [place], MD 08/11/22 [phone]

## 2022-08-11 NOTE — ED Notes (Signed)
Patient to imaging at this time.

## 2022-08-11 NOTE — Discharge Planning (Signed)
ESTABLISHED HEMODIALYSIS PATIENT: [place]  [place], [place] [address] [phone]  Scheduled Days: Monday Wednesday and Friday  Treatment Time: 5:30am  Confirmed extra treatment for tomorrow 11/14 at 5am with Neoma Laming RN. Patient and daughter are aware and agreeable to having a make-up treatment tomorrow morning.

## 2022-08-11 NOTE — Op Note (Signed)
OPERATIVE NOTE    PRE-OPERATIVE DIAGNOSIS: 1. ESRD 2. Nonfunctional AVF  POST-OPERATIVE DIAGNOSIS: same as above  PROCEDURE: Ultrasound guidance for vascular access to the right internal jugular vein Fluoroscopic guidance for placement of catheter Placement of a 19 cm tip to cuff tunneled hemodialysis catheter via the right internal jugular vein  SURGEON: Leotis Pain, MD  ANESTHESIA:  Local with Moderate conscious sedation for approximately 16 minutes using 1 mg of Versed and 50 mcg of Fentanyl  ESTIMATED BLOOD LOSS: 5 cc  FLUORO TIME: less than one minute  CONTRAST: none  FINDING(S): 1.  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  The patient needs long term dialysis access for their ESRD, and a Permcath is necessary.  Risks and benefits are discussed and informed consent is obtained.    DESCRIPTION: After obtaining full informed written consent, the patient was brought back to the vascular suited. The patient's right neck and chest were sterilely prepped and draped in a sterile surgical field was created. Moderate conscious sedation was administered during a face to face encounter with the patient throughout the procedure with my supervision of the RN administering medicines and monitoring the patient's vital signs, pulse oximetry, telemetry and mental status throughout from the start of the procedure until the patient was taken to the recovery room.  The right internal jugular vein was visualized with ultrasound and found to be patent. It was then accessed under direct ultrasound guidance and a permanent image was recorded. A wire was placed. After skin nick and dilatation, the peel-away sheath was placed over the wire. I then turned my attention to an area under the clavicle. Approximately 1-2 fingerbreadths below the clavicle a small  counterincision was created and tunneled from the subclavicular incision to the access site. Using fluoroscopic guidance, a 19 centimeter tip to cuff tunneled hemodialysis catheter was selected, and tunneled from the subclavicular incision to the access site. It was then placed through the peel-away sheath and the peel-away sheath was removed. Using fluoroscopic guidance the catheter tips were parked in the right atrium. The appropriate distal connectors were placed. It withdrew blood well and flushed easily with heparinized saline and a concentrated heparin solution was then placed. It was secured to the chest wall with 2 Prolene sutures. The access incision was closed single 4-0 Monocryl. A 4-0 Monocryl pursestring suture was placed around the exit site. Sterile dressings were placed. The patient tolerated the procedure well and was taken to the recovery room in stable condition.  COMPLICATIONS: None  CONDITION: Stable  Leotis Pain, MD 08/11/2022 3:19 PM   This note was created with Dragon Medical transcription system. Any errors in dictation are purely unintentional.

## 2022-08-11 NOTE — ED Notes (Signed)
Report given to Specials RN at this time.

## 2022-08-12 ENCOUNTER — Encounter: Payer: Self-pay | Admitting: Vascular Surgery

## 2022-08-16 LAB — CULTURE, BLOOD (ROUTINE X 2)
Culture: NO GROWTH
Culture: NO GROWTH

## 2022-08-18 ENCOUNTER — Telehealth (INDEPENDENT_AMBULATORY_CARE_PROVIDER_SITE_OTHER): Payer: Self-pay

## 2022-08-18 NOTE — Telephone Encounter (Signed)
Spoke with the patient's spouse with an interpreter, the patient is scheduled with Dr. Lucky Cowboy on 09/04/22 for a left arm fistula revision at the [facility]. Pre-op phone call is on 08/29/22 between 1-5 pm and pre-surgical instructions will be mailed.

## 2022-08-25 ENCOUNTER — Ambulatory Visit (INDEPENDENT_AMBULATORY_CARE_PROVIDER_SITE_OTHER): Payer: Self-pay | Admitting: Nurse Practitioner

## 2022-08-25 VITALS — BP 149/78 | HR 74 | Resp 16 | Wt 213.0 lb

## 2022-08-25 DIAGNOSIS — E1122 Type 2 diabetes mellitus with diabetic chronic kidney disease: Secondary | ICD-10-CM

## 2022-08-25 DIAGNOSIS — Z992 Dependence on renal dialysis: Secondary | ICD-10-CM

## 2022-08-25 DIAGNOSIS — N186 End stage renal disease: Secondary | ICD-10-CM

## 2022-08-25 DIAGNOSIS — I10 Essential (primary) hypertension: Secondary | ICD-10-CM

## 2022-08-26 MED FILL — NOVOLOG MIX 70-30 FLEXPEN U-100 INSULIN 100 UNIT/ML SUBCUTANEOUS PEN: SUBCUTANEOUS | 100 days supply | Qty: 15 | Fill #0

## 2022-08-26 MED FILL — SEVELAMER CARBONATE 800 MG TABLET: 30 days supply | Qty: 360 | Fill #0

## 2022-08-26 MED FILL — CINACALCET 60 MG TABLET: ORAL | 30 days supply | Qty: 30 | Fill #0

## 2022-08-26 MED FILL — CARVEDILOL 3.125 MG TABLET: ORAL | 30 days supply | Qty: 120 | Fill #0

## 2022-08-26 MED FILL — TRAZODONE 50 MG TABLET: ORAL | 30 days supply | Qty: 30 | Fill #0

## 2022-08-28 ENCOUNTER — Other Ambulatory Visit (INDEPENDENT_AMBULATORY_CARE_PROVIDER_SITE_OTHER): Payer: Self-pay | Admitting: Nurse Practitioner

## 2022-08-28 DIAGNOSIS — N186 End stage renal disease: Secondary | ICD-10-CM

## 2022-08-29 ENCOUNTER — Encounter
Admission: RE | Admit: 2022-08-29 | Discharge: 2022-08-29 | Disposition: A | Discharge status: Home / Self Care | Payer: Self-pay | Source: Ambulatory Visit | Attending: Vascular Surgery | Admitting: Vascular Surgery

## 2022-08-29 VITALS — Ht 69.0 in | Wt 190.0 lb

## 2022-08-29 DIAGNOSIS — E1122 Type 2 diabetes mellitus with diabetic chronic kidney disease: Secondary | ICD-10-CM

## 2022-08-29 DIAGNOSIS — N186 End stage renal disease: Secondary | ICD-10-CM

## 2022-08-29 HISTORY — DX: Sleep apnea, unspecified: G47.30

## 2022-08-29 NOTE — Patient Instructions (Addendum)
Your procedure is scheduled on: Thursday September 04, 2022. Su procedimiento est programado para: Jueves 12 de Diciembre del 2023. Report to Day Surgery inside [facility] 2nd floor, stop by registration desk before getting on elevator. Presntese a: Science writer del [facility] 2ndo piso, registrese primero antes de subir al [employer]. To find out your arrival time please call [phone] between 1PM - 3PM on Wednesday September 03, 2022. Para saber su hora de llegada por favor llame al [phone] entre la 1PM - [place]: [place] 2023.  Remember: Instructions that are not followed completely may result in serious medical risk, up to and including death,  or upon the discretion of your surgeon and anesthesiologist your surgery may need to be rescheduled.  Recuerde: Las instrucciones que no se siguen completamente Heritage manager en un riesgo de salud grave, incluyendo hasta  la [place] o a discrecin de su cirujano y Environmental health practitioner, su ciruga se puede posponer.   __X_ 1.Do not eat food after midnight the night before your procedure. No    gum chewing or hard candies. You may drink clear liquids up to 2 hours     before you are scheduled to arrive for your surgery- DO not drink clear     Liquids within 2 hours of the start of your surgery.      No coma nada despus de la medianoche de la noche anterior a su    procedimiento. No coma chicles ni caramelos duros. Los lquidos claros incluyen:                        _X__ 2.Do Not Smoke or use e-cigarettes For 24 Hours Prior to Your Surgery.    Do not use any chewable tobacco products for at least 6   hours prior to surgery.    No fume ni use cigarrillos electrnicos durante las 24 horas previas    a su [place].  No use ningn producto de tabaco masticable durante   al menos 6 horas antes de la [place].     __X_ 3. No alcohol for 24 hours before or after surgery.    No tome alcohol durante las 24  horas antes ni despus de la [place].   __X__4. On the morning of surgery brush your teeth with toothpaste and water, you                may rinse your mouth with mouthwash if you wish.  Do not swallow any toothpaste of mouthwash.   En la maana de la [place], cepllese los dientes con pasta de dientes y [place],                [place] enjuagarse la boca con enjuague bucal si lo desea. No ingiera ninguna pasta de dientes o enjuague bucal.   __X__ 5. Notify your doctor if there is any change in your medical condition (cold,fever, infections).    Informe a su mdico si hay algn cambio en su condicin mdica  (resfriado, fiebre, infecciones).   Do not wear jewelry, make-up, hairpins, clips or nail polish.  No use joyas, maquillajes, pinzas/ganchos para el cabello ni esmalte de uas.  Do not wear lotions, powders, or perfumes. You may wear deodorant.  No use lociones, polvos o perfumes.  Puede usar desodorante.    Do not shave 48 hours prior to surgery. Men may shave face and neck.  No se afeite 48 horas antes de  la [place].  Los hombres pueden [employer] cara  y el cuello.   Do not bring valuables to the hospital.   No lleve objetos [place] is not responsible for any belongings or valuables.  [place] no se hace responsable de ningn tipo de pertenencias u objetos de Geographical information systems officer.               Contacts, dentures or bridgework may not be worn into surgery.  Los lentes de [place], las dentaduras postizas o puentes no se pueden usar en la [place].   Leave your suitcase in the car. After surgery it may be brought to your room.  Deje su maleta en el auto.  Despus de la ciruga podr traerla a su habitacin.   For patients admitted to the hospital, discharge time is determined by your  treatment team.  Para los pacientes que sean ingresados al hospital, el tiempo en el cual se le  dar de alta es determinado por su equipo de [place].   Patients discharged the day  of surgery will not be allowed to drive home. A los pacientes que se les da de alta el mismo da de la ciruga no se les permitir conducir a Holiday representative.   __X__ Take these medicines the morning of surgery with A SIP OF WATER:          Tome estas medicinas la maana de la ciruga con UN SORBO DE AGUA:  1. amLODipine (NORVASC) 5 MG   2. carvedilol (COREG) 3.125 MG   3. citalopram (CELEXA) 20 MG   4. gabapentin (NEURONTIN) 300 MG      5. omeprazole (PRILOSEC OTC) 20 MG   6.  ____ Fleet Enema (as directed)          Enema de Fleet (segn lo indicado)    __X__ Use CHG Soap as directed          Utilice el jabn de CHG segn lo indicado  ____ Use inhalers on the day of surgery          Use los inhaladores el da de la ciruga  ____ Stop metformin 2 days prior to surgery          Deje de tomar el metformin 2 das antes de la ciruga    __X__ Take  usual insulin dose the night before surgery and none on the morning of surgery  insulin aspart protamine- aspart (NOVOLOG MIX 70/30) (70-30) 100 UNIT/ML injection           Tome la mitad de la dosis habitual de insulina la noche antes de la [place] y no tome nada en la maana de la             ciruga   __X__ Stop Anti-inflammatories such as Ibuprofen, Aleve, Advil, Motrin, Naprosyn, Meloxicam, Lodine, Ketoralac, Midol, and aspirin containing products like Excedrin, Goody's and or BC Powders.          Deje de tomar antiinflamatorios como Ibuprofen, Aleve, Advil, Motrin, Naprosyn, Meloxicam, Lodine, Ketoralac, Midol, o productos con aspirina como Excedrin, Goody's and or BC Powders.   __X__ Stop supplements until after surgery            Deje de tomar suplementos hasta despus de la ciruga  ____ Bring C-Pap to the hospital          [place] al hospital     If you have any questions regarding your pre-procedure instructions,  Please call  Pre-admit Testing at [phone]  Si tiene alguna pregunta con respecto a las instrucciones previas  al procedimiento, [place] previas a la admisin al [phone]      Preparacin para la ciruga con jabn de GLUCONATO DE CLORHEXIDINA (CHG)  Jabn de gluconato de clorhexidina (CHG)  o Un limpiador antisptico que [employer] grmenes y se adhiere a la piel para seguir [employer] grmenes incluso despus del lavado.  o Se utiliza para ducharse la noche anterior a la [place] y la maana de la [place].  Antes de la [place], usted puede desempear un papel importante al reducir la cantidad de grmenes en su piel. El jabn CHG (gluconato de clorhexidina) es un limpiador antisptico que mata los grmenes y se adhiere a la piel para continuar matndolos incluso despus del lavado.  No lo utilice si es alrgico al CHG o a los jabones antibacterianos. Si su piel se enrojece o irrita, deje de usar CHG.  1. Ducharse la [place]OC[place] ANTES DE LA CIRUGA y la [place] DE LA CIRUGA con jabn CHG.  2. Si eliges lavarte el cabello, lvalo primero como de costumbre con tu champ habitual.  3. Despus del champ, enjuague bien el cabello y el cuerpo para eliminar el champ.  4. Utilice CHG como lo hara con cualquier otro jabn lquido. Puede aplicar CHG directamente sobre la piel y lavar suavemente con un pauelo o una toallita limpia.  5. Aplique el jabn CHG en su cuerpo nicamente desde el cuello hacia abajo. No utilizar en heridas abiertas o llagas abiertas. Evite el contacto con los ojos, odos, boca y genitales (partes privadas). Lvese la cara y los genitales (partes privadas) con su jabn habitual.  6. Lvese bien, prestando especial atencin al rea donde se realizar su ciruga.  7. Enjuague bien su cuerpo con agua tibia.  8. No se duche ni se lave con su jabn normal despus de usar y enjuagar el jabn CHG.  9. Squese dando palmaditas con una toalla limpia.  10. Use pijamas limpios para dormir la noche anterior a la ciruga.  12. Coloque sbanas limpias en su cama la noche de su  primera ducha y no duerma con mascotas.  13. Ducharse nuevamente con el [place] ciruga antes de llegar al hospital.  14. No aplique desodorantes, lociones o polvos.  15. Por favor use ropa limpia al hospital.

## 2022-09-01 ENCOUNTER — Encounter (INDEPENDENT_AMBULATORY_CARE_PROVIDER_SITE_OTHER): Payer: Self-pay | Admitting: Nurse Practitioner

## 2022-09-01 NOTE — Progress Notes (Signed)
Subjective:    Patient ID: Danny [name], male    DOB: 01-Jun-1956, 66 y.o.   MRN: [id number] Chief Complaint  Patient presents with   Follow-up    [facility] 1 week follow up    The patient is a 66 year old male who recently presented to [facility] due to redness and pain and bleeding from his AV fistula.  He had a small area which was bleeding.  Based on his description it sounds as if there was a blister in the area.  Due to the aneurysmal nature of the patient's fistula, a PermCath was placed.  The patient has been using the PermCath without issue.  The area that was small and open has resolved.  However there still remains some skin threatening as well as a large size of his fistula.  In the hospital it was discussed that the patient will likely need revision of his fistula.    Review of Systems  Hematological:  Bruises/bleeds easily.  All other systems reviewed and are negative.      Objective:   Physical Exam Vitals reviewed.  HENT:     Head: Normocephalic.  Cardiovascular:     Rate and Rhythm: Normal rate.     Pulses:          Radial pulses are 2+ on the left side.  Pulmonary:     Effort: Pulmonary effort is normal.  Skin:    General: Skin is warm and dry.  Neurological:     Mental Status: He is alert and oriented to person, place, and time.  Psychiatric:        Mood and Affect: Mood normal.        Behavior: Behavior normal.        Thought Content: Thought content normal.        Judgment: Judgment normal.     BP (!) 149/78 (BP Location: Right Arm)   Pulse 74   Resp 16   Wt 213 lb (96.6 kg)   BMI 32.39 kg/m   Past Medical History:  Diagnosis Date   Diabetes mellitus without complication ([place])    Dialysis patient ([place])    tuesday, thursday and saturday   Gout    Hypertension    Kidney disease    Sleep apnea     Social History   Socioeconomic History   Marital status: Married    Spouse name: Not on file   Number of  children: Not on file   Years of education: Not on file   Highest education level: Not on file  Occupational History   Not on file  Tobacco Use   Smoking status: Never   Smokeless tobacco: Never  Vaping Use   Vaping Use: Never used  Substance and Sexual Activity   Alcohol use: No   Drug use: No   Sexual activity: Not on file  Other Topics Concern   Not on file  Social History Narrative   Independent at baseline   Social Determinants of Health   Financial Resource Strain: Not on file  Food Insecurity: Not on file  Transportation Needs: Not on file  Physical Activity: Not on file  Stress: Not on file  Social Connections: Not on file  Intimate Partner Violence: Not on file    Past Surgical History:  Procedure Laterality Date   AV FISTULA PLACEMENT     DIALYSIS/PERMA CATHETER INSERTION Right 08/11/2022   Procedure: DIALYSIS/PERMA CATHETER INSERTION;  Surgeon: Algernon Huxley, MD;  Location: [facility];  Service: Cardiovascular;  Laterality: Right;   miscellaneous     peritoneal dialysis catheter placement and removal    Family History  Problem Relation Age of Onset   Diabetes Mother    Prostate cancer Father    Kidney disease Sister     No Known Allergies     Latest Ref Rng & Units 08/11/2022    7:53 AM 08/09/2022    6:30 AM 04/09/2022   11:56 AM  CBC  WBC 4.0 - 10.5 K/uL 5.8  5.6  4.0   Hemoglobin 13.0 - 17.0 g/dL 12.1  14.7  10.6   Hematocrit 39.0 - 52.0 % 34.4  42.5  32.1   Platelets 150 - 400 K/uL 88  88  142       CMP     Component Value Date/Time   NA 134 (L) 08/11/2022 0753   NA 128 (L) 09/10/2013 2125   K 5.1 08/11/2022 0753   K 3.4 (L) 09/10/2013 2125   CL 97 (L) 08/11/2022 0753   CL 94 (L) 09/10/2013 2125   CO2 19 (L) 08/11/2022 0753   CO2 25 09/10/2013 2125   GLUCOSE 142 (H) 08/11/2022 0753   GLUCOSE 479 (H) 09/10/2013 2125   BUN 111 (H) 08/11/2022 0753   BUN 43 (H) 09/10/2013 2125   CREATININE 14.85 (H) 08/11/2022 0753    CREATININE 2.47 (H) 09/10/2013 2125   CALCIUM 8.9 08/11/2022 0753   CALCIUM 8.5 09/10/2013 2125   PROT 7.1 08/11/2022 0753   PROT 6.9 09/10/2013 2125   ALBUMIN 3.3 (L) 08/11/2022 0753   ALBUMIN 2.6 (L) 09/10/2013 2125   AST 12 (L) 08/11/2022 0753   AST 22 09/10/2013 2125   ALT 10 08/11/2022 0753   ALT 27 09/10/2013 2125   ALKPHOS 113 08/11/2022 0753   ALKPHOS 141 (H) 09/10/2013 2125   BILITOT 0.7 08/11/2022 0753   BILITOT 0.2 09/10/2013 2125   GFRNONAA 3 (L) 08/11/2022 0753   GFRNONAA 28 (L) 09/10/2013 2125   GFRAA 9 (L) 02/19/2020 1322   GFRAA 32 (L) 09/10/2013 2125     No results found.     Assessment & Plan:   1. ESRD (end stage renal disease) on dialysis [facility]) Based on the extensively aneurysmal fistula as well as the concerning bleeding episode, the patient should have a revision utilizing Artegraft.  Patient should also have subsequent revision and resection of his aneurysmal fistula.  I have discussed this with the patient and family and they are agreeable to proceed.  2. Primary hypertension Continue antihypertensive medications as already ordered, these medications have been reviewed and there are no changes at this time.  3. Diabetes mellitus with ESRD (end-stage renal disease) ([place]) Continue hypoglycemic medications as already ordered, these medications have been reviewed and there are no changes at this time.  Hgb A1C to be monitored as already arranged by primary service   Current Outpatient Medications on File Prior to Visit  Medication Sig Dispense Refill   albuterol (VENTOLIN HFA) 108 (90 Base) MCG/ACT inhaler Inhale 2 puffs into the lungs every 6 (six) hours as needed for wheezing.     amLODipine (NORVASC) 5 MG tablet Take 5 mg by mouth at bedtime.     aspirin 81 MG EC tablet Take 1 tablet by mouth daily. (Patient not taking: Reported on 08/29/2022)     calcitRIOL (ROCALTROL) 0.25 MCG capsule Take 1 capsule by mouth daily.     calcium acetate (PHOSLO) 667  MG  capsule Take 2,001 mg by mouth 3 (three) times daily with meals.      carvedilol (COREG) 3.125 MG tablet Take 3.125 mg by mouth 2 (two) times daily with a meal.     furosemide (LASIX) 40 MG tablet Take 1 tablet by mouth daily. (Patient not taking: Reported on 08/29/2022)     gabapentin (NEURONTIN) 300 MG capsule Take 300 mg by mouth daily.     insulin aspart protamine- aspart (NOVOLOG MIX 70/30) (70-30) 100 UNIT/ML injection Inject 0.05-0.1 mLs (5-10 Units total) into the skin See admin instructions. 5 units every morning and 4 units every evening 10 mL 11   lidocaine (LIDODERM) 5 % Place 1 patch onto the skin every 12 (twelve) hours. Remove & Discard patch within 12 hours or as directed by MD 10 patch 0   lisinopril (ZESTRIL) 20 MG tablet Take 0.5 tablets (10 mg total) by mouth daily.     loperamide (IMODIUM) 2 MG capsule Take 1 capsule (2 mg total) by mouth every 6 (six) hours as needed for diarrhea or loose stools. (Patient not taking: Reported on 08/29/2022) 30 capsule 0   Nutritional Supplements (FEEDING SUPPLEMENT, NEPRO CARB STEADY,) LIQD Take 237 mLs by mouth 2 (two) times daily between meals. (Patient not taking: Reported on 08/29/2022) 237 mL 0   omeprazole (PRILOSEC OTC) 20 MG tablet Take 20 mg by mouth daily.     oxyCODONE-acetaminophen (PERCOCET) 5-325 MG tablet Take 1 tablet by mouth every 4 (four) hours as needed for severe pain. (Patient not taking: Reported on 08/29/2022) 8 tablet 0   cinacalcet (SENSIPAR) 30 MG tablet Take 1 tablet by mouth daily. (Patient not taking: Reported on 08/09/2022)     citalopram (CELEXA) 20 MG tablet Take 20 mg by mouth daily.  (Patient not taking: Reported on 02/19/2021)     multivitamin (RENA-VIT) TABS tablet Take 1 tablet by mouth at bedtime. (Patient not taking: Reported on 02/19/2021) 30 tablet 0   sevelamer carbonate (RENVELA) 800 MG tablet Take 800-3,200 mg by mouth See admin instructions. Take 4 tablets (3200mg ) by mouth three times daily with meals  (Patient not taking: Reported on 08/09/2022)     sodium zirconium cyclosilicate (LOKELMA) 5 g packet Take 5 g by mouth daily. (Patient not taking: Reported on 08/09/2022) 30 packet 1   traZODone (DESYREL) 50 MG tablet Take 1 tablet by mouth at bedtime. (Patient not taking: Reported on 08/29/2022)     No current facility-administered medications on file prior to visit.    There are no Patient Instructions on file for this visit. No follow-ups on file.   Kris Hartmann, NP

## 2022-09-01 NOTE — H&P (View-Only) (Signed)
Subjective:    Patient ID: Danny [name], male    DOB: [birth year], 66 y.o.   MRN: [id number] Chief Complaint  Patient presents with   Follow-up    [facility] 1 week follow up    The patient is a 66 year old male who recently presented to [facility] due to redness and pain and bleeding from his AV fistula.  He had a small area which was bleeding.  Based on his description it sounds as if there was a blister in the area.  Due to the aneurysmal nature of the patient's fistula, a PermCath was placed.  The patient has been using the PermCath without issue.  The area that was small and open has resolved.  However there still remains some skin threatening as well as a large size of his fistula.  In the hospital it was discussed that the patient will likely need revision of his fistula.    Review of Systems  Hematological:  Bruises/bleeds easily.  All other systems reviewed and are negative.      Objective:   Physical Exam Vitals reviewed.  HENT:     Head: Normocephalic.  Cardiovascular:     Rate and Rhythm: Normal rate.     Pulses:          Radial pulses are 2+ on the left side.  Pulmonary:     Effort: Pulmonary effort is normal.  Skin:    General: Skin is warm and dry.  Neurological:     Mental Status: He is alert and oriented to person, place, and time.  Psychiatric:        Mood and Affect: Mood normal.        Behavior: Behavior normal.        Thought Content: Thought content normal.        Judgment: Judgment normal.     BP (!) 149/78 (BP Location: Right Arm)   Pulse 74   Resp 16   Wt 213 lb (96.6 kg)   BMI 32.39 kg/m   Past Medical History:  Diagnosis Date   Diabetes mellitus without complication ([place])    Dialysis patient ([place])    tuesday, thursday and saturday   Gout    Hypertension    Kidney disease    Sleep apnea     Social History   Socioeconomic History   Marital status: Married    Spouse name: Not on file   Number of  children: Not on file   Years of education: Not on file   Highest education level: Not on file  Occupational History   Not on file  Tobacco Use   Smoking status: Never   Smokeless tobacco: Never  Vaping Use   Vaping Use: Never used  Substance and Sexual Activity   Alcohol use: No   Drug use: No   Sexual activity: Not on file  Other Topics Concern   Not on file  Social History Narrative   Independent at baseline   Social Determinants of Health   Financial Resource Strain: Not on file  Food Insecurity: Not on file  Transportation Needs: Not on file  Physical Activity: Not on file  Stress: Not on file  Social Connections: Not on file  Intimate Partner Violence: Not on file    Past Surgical History:  Procedure Laterality Date   AV FISTULA PLACEMENT     DIALYSIS/PERMA CATHETER INSERTION Right 08/11/2022   Procedure: DIALYSIS/PERMA CATHETER INSERTION;  Surgeon: Algernon Huxley, MD;  Location: [facility];  Service: Cardiovascular;  Laterality: Right;   miscellaneous     peritoneal dialysis catheter placement and removal    Family History  Problem Relation Age of Onset   Diabetes Mother    Prostate cancer Father    Kidney disease Sister     No Known Allergies     Latest Ref Rng & Units 08/11/2022    7:53 AM 08/09/2022    6:30 AM 04/09/2022   11:56 AM  CBC  WBC 4.0 - 10.5 K/uL 5.8  5.6  4.0   Hemoglobin 13.0 - 17.0 g/dL 12.1  14.7  10.6   Hematocrit 39.0 - 52.0 % 34.4  42.5  32.1   Platelets 150 - 400 K/uL 88  88  142       CMP     Component Value Date/Time   NA 134 (L) 08/11/2022 0753   NA 128 (L) 09/10/2013 2125   K 5.1 08/11/2022 0753   K 3.4 (L) 09/10/2013 2125   CL 97 (L) 08/11/2022 0753   CL 94 (L) 09/10/2013 2125   CO2 19 (L) 08/11/2022 0753   CO2 25 09/10/2013 2125   GLUCOSE 142 (H) 08/11/2022 0753   GLUCOSE 479 (H) 09/10/2013 2125   BUN 111 (H) 08/11/2022 0753   BUN 43 (H) 09/10/2013 2125   CREATININE 14.85 (H) 08/11/2022 0753    CREATININE 2.47 (H) 09/10/2013 2125   CALCIUM 8.9 08/11/2022 0753   CALCIUM 8.5 09/10/2013 2125   PROT 7.1 08/11/2022 0753   PROT 6.9 09/10/2013 2125   ALBUMIN 3.3 (L) 08/11/2022 0753   ALBUMIN 2.6 (L) 09/10/2013 2125   AST 12 (L) 08/11/2022 0753   AST 22 09/10/2013 2125   ALT 10 08/11/2022 0753   ALT 27 09/10/2013 2125   ALKPHOS 113 08/11/2022 0753   ALKPHOS 141 (H) 09/10/2013 2125   BILITOT 0.7 08/11/2022 0753   BILITOT 0.2 09/10/2013 2125   GFRNONAA 3 (L) 08/11/2022 0753   GFRNONAA 28 (L) 09/10/2013 2125   GFRAA 9 (L) 02/19/2020 1322   GFRAA 32 (L) 09/10/2013 2125     No results found.     Assessment & Plan:   1. ESRD (end stage renal disease) on dialysis [facility]) Based on the extensively aneurysmal fistula as well as the concerning bleeding episode, the patient should have a revision utilizing Artegraft.  Patient should also have subsequent revision and resection of his aneurysmal fistula.  I have discussed this with the patient and family and they are agreeable to proceed.  2. Primary hypertension Continue antihypertensive medications as already ordered, these medications have been reviewed and there are no changes at this time.  3. Diabetes mellitus with ESRD (end-stage renal disease) ([place]) Continue hypoglycemic medications as already ordered, these medications have been reviewed and there are no changes at this time.  Hgb A1C to be monitored as already arranged by primary service   Current Outpatient Medications on File Prior to Visit  Medication Sig Dispense Refill   albuterol (VENTOLIN HFA) 108 (90 Base) MCG/ACT inhaler Inhale 2 puffs into the lungs every 6 (six) hours as needed for wheezing.     amLODipine (NORVASC) 5 MG tablet Take 5 mg by mouth at bedtime.     aspirin 81 MG EC tablet Take 1 tablet by mouth daily. (Patient not taking: Reported on 08/29/2022)     calcitRIOL (ROCALTROL) 0.25 MCG capsule Take 1 capsule by mouth daily.     calcium acetate (PHOSLO) 667  MG  capsule Take 2,001 mg by mouth 3 (three) times daily with meals.      carvedilol (COREG) 3.125 MG tablet Take 3.125 mg by mouth 2 (two) times daily with a meal.     furosemide (LASIX) 40 MG tablet Take 1 tablet by mouth daily. (Patient not taking: Reported on 08/29/2022)     gabapentin (NEURONTIN) 300 MG capsule Take 300 mg by mouth daily.     insulin aspart protamine- aspart (NOVOLOG MIX 70/30) (70-30) 100 UNIT/ML injection Inject 0.05-0.1 mLs (5-10 Units total) into the skin See admin instructions. 5 units every morning and 4 units every evening 10 mL 11   lidocaine (LIDODERM) 5 % Place 1 patch onto the skin every 12 (twelve) hours. Remove & Discard patch within 12 hours or as directed by MD 10 patch 0   lisinopril (ZESTRIL) 20 MG tablet Take 0.5 tablets (10 mg total) by mouth daily.     loperamide (IMODIUM) 2 MG capsule Take 1 capsule (2 mg total) by mouth every 6 (six) hours as needed for diarrhea or loose stools. (Patient not taking: Reported on 08/29/2022) 30 capsule 0   Nutritional Supplements (FEEDING SUPPLEMENT, NEPRO CARB STEADY,) LIQD Take 237 mLs by mouth 2 (two) times daily between meals. (Patient not taking: Reported on 08/29/2022) 237 mL 0   omeprazole (PRILOSEC OTC) 20 MG tablet Take 20 mg by mouth daily.     oxyCODONE-acetaminophen (PERCOCET) 5-325 MG tablet Take 1 tablet by mouth every 4 (four) hours as needed for severe pain. (Patient not taking: Reported on 08/29/2022) 8 tablet 0   cinacalcet (SENSIPAR) 30 MG tablet Take 1 tablet by mouth daily. (Patient not taking: Reported on 08/09/2022)     citalopram (CELEXA) 20 MG tablet Take 20 mg by mouth daily.  (Patient not taking: Reported on 02/19/2021)     multivitamin (RENA-VIT) TABS tablet Take 1 tablet by mouth at bedtime. (Patient not taking: Reported on 02/19/2021) 30 tablet 0   sevelamer carbonate (RENVELA) 800 MG tablet Take 800-3,200 mg by mouth See admin instructions. Take 4 tablets (3200mg ) by mouth three times daily with meals  (Patient not taking: Reported on 08/09/2022)     sodium zirconium cyclosilicate (LOKELMA) 5 g packet Take 5 g by mouth daily. (Patient not taking: Reported on 08/09/2022) 30 packet 1   traZODone (DESYREL) 50 MG tablet Take 1 tablet by mouth at bedtime. (Patient not taking: Reported on 08/29/2022)     No current facility-administered medications on file prior to visit.    There are no Patient Instructions on file for this visit. No follow-ups on file.   Kris Hartmann, NP

## 2022-09-02 ENCOUNTER — Encounter: Payer: Self-pay | Admitting: Urgent Care

## 2022-09-02 ENCOUNTER — Encounter
Admission: RE | Admit: 2022-09-02 | Discharge: 2022-09-02 | Disposition: A | Discharge status: Home / Self Care | Payer: Self-pay | Source: Ambulatory Visit | Attending: Vascular Surgery | Admitting: Vascular Surgery

## 2022-09-02 DIAGNOSIS — N186 End stage renal disease: Secondary | ICD-10-CM | POA: Insufficient documentation

## 2022-09-02 DIAGNOSIS — Z01818 Encounter for other preprocedural examination: Secondary | ICD-10-CM | POA: Insufficient documentation

## 2022-09-02 DIAGNOSIS — Z992 Dependence on renal dialysis: Secondary | ICD-10-CM | POA: Insufficient documentation

## 2022-09-02 LAB — TYPE AND SCREEN
ABO/RH(D): O POS
Antibody Screen: NEGATIVE

## 2022-09-03 MED ORDER — SODIUM CHLORIDE 0.9 % IV SOLN: INTRAVENOUS | Status: DC

## 2022-09-03 MED ORDER — FAMOTIDINE 20 MG PO TABS: 20.0000 mg | ORAL_TABLET | Freq: Once | ORAL | Status: AC

## 2022-09-03 MED ORDER — CEFAZOLIN SODIUM-DEXTROSE 2-4 GM/100ML-% IV SOLN
2.0000 g | INTRAVENOUS | Status: AC
  Administered 2022-09-04: 2 g via INTRAVENOUS

## 2022-09-03 MED ORDER — CHLORHEXIDINE GLUCONATE 0.12 % MT SOLN: 15.0000 mL | Freq: Once | OROMUCOSAL | Status: AC

## 2022-09-03 MED ORDER — CHLORHEXIDINE GLUCONATE CLOTH 2 % EX PADS
6.0000 | MEDICATED_PAD | Freq: Once | CUTANEOUS | Status: AC
  Administered 2022-09-04: 6 via TOPICAL

## 2022-09-03 MED ORDER — CHLORHEXIDINE GLUCONATE CLOTH 2 % EX PADS: 6.0000 | MEDICATED_PAD | Freq: Once | CUTANEOUS | Status: DC

## 2022-09-03 MED ORDER — ORAL CARE MOUTH RINSE: 15.0000 mL | Freq: Once | OROMUCOSAL | Status: AC

## 2022-09-04 ENCOUNTER — Ambulatory Visit
Admission: RE | Admit: 2022-09-04 | Discharge: 2022-09-04 | Disposition: A | Discharge status: Home / Self Care | Payer: Self-pay | Attending: Vascular Surgery | Admitting: Vascular Surgery

## 2022-09-04 ENCOUNTER — Encounter: Payer: Self-pay | Admitting: Vascular Surgery

## 2022-09-04 ENCOUNTER — Other Ambulatory Visit: Payer: Self-pay

## 2022-09-04 ENCOUNTER — Ambulatory Visit: Payer: Self-pay | Admitting: Anesthesiology

## 2022-09-04 ENCOUNTER — Encounter
Admission: RE | Disposition: A | Discharge status: Home / Self Care | Payer: Self-pay | Source: Home / Self Care | Attending: Vascular Surgery

## 2022-09-04 DIAGNOSIS — E1122 Type 2 diabetes mellitus with diabetic chronic kidney disease: Secondary | ICD-10-CM

## 2022-09-04 DIAGNOSIS — Z79899 Other long term (current) drug therapy: Secondary | ICD-10-CM | POA: Insufficient documentation

## 2022-09-04 DIAGNOSIS — N186 End stage renal disease: Secondary | ICD-10-CM | POA: Insufficient documentation

## 2022-09-04 DIAGNOSIS — G4733 Obstructive sleep apnea (adult) (pediatric): Secondary | ICD-10-CM | POA: Insufficient documentation

## 2022-09-04 DIAGNOSIS — Z794 Long term (current) use of insulin: Secondary | ICD-10-CM | POA: Insufficient documentation

## 2022-09-04 DIAGNOSIS — I509 Heart failure, unspecified: Secondary | ICD-10-CM | POA: Insufficient documentation

## 2022-09-04 DIAGNOSIS — Y832 Surgical operation with anastomosis, bypass or graft as the cause of abnormal reaction of the patient, or of later complication, without mention of misadventure at the time of the procedure: Secondary | ICD-10-CM | POA: Insufficient documentation

## 2022-09-04 DIAGNOSIS — Z992 Dependence on renal dialysis: Secondary | ICD-10-CM

## 2022-09-04 DIAGNOSIS — I132 Hypertensive heart and chronic kidney disease with heart failure and with stage 5 chronic kidney disease, or end stage renal disease: Secondary | ICD-10-CM | POA: Insufficient documentation

## 2022-09-04 DIAGNOSIS — T82898A Other specified complication of vascular prosthetic devices, implants and grafts, initial encounter: Secondary | ICD-10-CM

## 2022-09-04 DIAGNOSIS — J449 Chronic obstructive pulmonary disease, unspecified: Secondary | ICD-10-CM | POA: Insufficient documentation

## 2022-09-04 DIAGNOSIS — T82511A Breakdown (mechanical) of surgically created arteriovenous shunt, initial encounter: Secondary | ICD-10-CM | POA: Insufficient documentation

## 2022-09-04 HISTORY — PX: REVISON OF ARTERIOVENOUS FISTULA: SHX6074

## 2022-09-04 HISTORY — PX: LIGATION OF ARTERIOVENOUS  FISTULA: SHX5948

## 2022-09-04 HISTORY — PX: INSERTION OF ARTERIOVENOUS (AV) ARTEGRAFT ARM: SHX6779

## 2022-09-04 LAB — POCT I-STAT, CHEM 8
BUN: 30 mg/dL — ABNORMAL HIGH (ref 8–23)
Calcium, Ion: 0.9 mmol/L — ABNORMAL LOW (ref 1.15–1.40)
Chloride: 97 mmol/L — ABNORMAL LOW (ref 98–111)
Creatinine, Ser: 6.9 mg/dL — ABNORMAL HIGH (ref 0.61–1.24)
Glucose, Bld: 109 mg/dL — ABNORMAL HIGH (ref 70–99)
HCT: 34 % — ABNORMAL LOW (ref 39.0–52.0)
Hemoglobin: 11.6 g/dL — ABNORMAL LOW (ref 13.0–17.0)
Potassium: 5.3 mmol/L — ABNORMAL HIGH (ref 3.5–5.1)
Sodium: 134 mmol/L — ABNORMAL LOW (ref 135–145)
TCO2: 26 mmol/L (ref 22–32)

## 2022-09-04 LAB — GLUCOSE, CAPILLARY: Glucose-Capillary: 113 mg/dL — ABNORMAL HIGH (ref 70–99)

## 2022-09-04 LAB — ABO/RH: ABO/RH(D): O POS

## 2022-09-04 SURGERY — REVISON OF ARTERIOVENOUS FISTULA
Anesthesia: General | Laterality: Left

## 2022-09-04 MED ORDER — HEPARIN SODIUM (PORCINE) 5000 UNIT/ML IJ SOLN
INTRAMUSCULAR | Status: AC
  Filled 2022-09-04: qty 1

## 2022-09-04 MED ORDER — HYDROCODONE-ACETAMINOPHEN 5-325 MG PO TABS: 2.0000 | ORAL_TABLET | Freq: Four times a day (QID) | ORAL | 0 refills | Status: AC | PRN

## 2022-09-04 MED ORDER — OXYCODONE HCL 5 MG/5ML PO SOLN: 5.0000 mg | Freq: Once | ORAL | Status: DC | PRN

## 2022-09-04 MED ORDER — LIDOCAINE HCL (CARDIAC) PF 100 MG/5ML IV SOSY
PREFILLED_SYRINGE | INTRAVENOUS | Status: DC | PRN
  Administered 2022-09-04: 100 mg via INTRAVENOUS

## 2022-09-04 MED ORDER — FENTANYL CITRATE (PF) 100 MCG/2ML IJ SOLN
INTRAMUSCULAR | Status: DC | PRN
  Administered 2022-09-04: 50 ug via INTRAVENOUS

## 2022-09-04 MED ORDER — MIDAZOLAM HCL 2 MG/2ML IJ SOLN
INTRAMUSCULAR | Status: AC
  Filled 2022-09-04: qty 2

## 2022-09-04 MED ORDER — SUGAMMADEX SODIUM 500 MG/5ML IV SOLN
INTRAVENOUS | Status: DC | PRN
  Administered 2022-09-04: 200 mg via INTRAVENOUS

## 2022-09-04 MED ORDER — FENTANYL CITRATE (PF) 100 MCG/2ML IJ SOLN: 25.0000 ug | INTRAMUSCULAR | Status: DC | PRN

## 2022-09-04 MED ORDER — FAMOTIDINE 20 MG PO TABS
ORAL_TABLET | ORAL | Status: AC
  Administered 2022-09-04: 20 mg via ORAL
  Filled 2022-09-04: qty 1

## 2022-09-04 MED ORDER — BUPIVACAINE-EPINEPHRINE 0.5% -1:200000 IJ SOLN
INTRAMUSCULAR | Status: DC | PRN
  Administered 2022-09-04: 10 mL

## 2022-09-04 MED ORDER — PROPOFOL 10 MG/ML IV BOLUS
INTRAVENOUS | Status: DC | PRN
  Administered 2022-09-04: 120 mg via INTRAVENOUS

## 2022-09-04 MED ORDER — HYDROMORPHONE HCL 1 MG/ML IJ SOLN: 1.0000 mg | Freq: Once | INTRAMUSCULAR | Status: DC | PRN

## 2022-09-04 MED ORDER — BUPIVACAINE-EPINEPHRINE (PF) 0.5% -1:200000 IJ SOLN
INTRAMUSCULAR | Status: AC
  Filled 2022-09-04: qty 30

## 2022-09-04 MED ORDER — ONDANSETRON HCL 4 MG/2ML IJ SOLN: 4.0000 mg | Freq: Four times a day (QID) | INTRAMUSCULAR | Status: DC | PRN

## 2022-09-04 MED ORDER — OXYCODONE HCL 5 MG PO TABS: 5.0000 mg | ORAL_TABLET | Freq: Once | ORAL | Status: DC | PRN

## 2022-09-04 MED ORDER — CHLORHEXIDINE GLUCONATE 0.12 % MT SOLN
OROMUCOSAL | Status: AC
  Administered 2022-09-04: 15 mL via OROMUCOSAL
  Filled 2022-09-04: qty 15

## 2022-09-04 MED ORDER — HEPARIN SODIUM (PORCINE) 1000 UNIT/ML IJ SOLN
INTRAMUSCULAR | Status: DC | PRN
  Administered 2022-09-04: 3000 [IU] via INTRAVENOUS

## 2022-09-04 MED ORDER — FENTANYL CITRATE (PF) 100 MCG/2ML IJ SOLN
INTRAMUSCULAR | Status: AC
  Filled 2022-09-04: qty 2

## 2022-09-04 MED ORDER — SODIUM CHLORIDE 0.9 % IV SOLN
INTRAVENOUS | Status: DC | PRN
  Administered 2022-09-04: 120 mL via INTRAMUSCULAR

## 2022-09-04 MED ORDER — GLYCOPYRROLATE 0.2 MG/ML IJ SOLN
INTRAMUSCULAR | Status: DC | PRN
  Administered 2022-09-04: .2 mg via INTRAVENOUS

## 2022-09-04 MED ORDER — HEPARIN 5000 UNITS IN NS 1000 ML (FLUSH)
INTRAMUSCULAR | Status: DC | PRN
  Administered 2022-09-04: 500 mL via INTRAMUSCULAR

## 2022-09-04 MED ORDER — ONDANSETRON HCL 4 MG/2ML IJ SOLN
INTRAMUSCULAR | Status: DC | PRN
  Administered 2022-09-04 (×2): 4 mg via INTRAVENOUS

## 2022-09-04 MED ORDER — ROCURONIUM BROMIDE 100 MG/10ML IV SOLN
INTRAVENOUS | Status: DC | PRN
  Administered 2022-09-04: 50 mg via INTRAVENOUS

## 2022-09-04 SURGICAL SUPPLY — 55 items
BAG DECANTER FOR FLEXI CONT (MISCELLANEOUS) ×1 IMPLANT
BLADE SURG SZ11 CARB STEEL (BLADE) ×1 IMPLANT
BOOT SUTURE AID YELLOW STND (SUTURE) ×1 IMPLANT
BRUSH SCRUB EZ  4% CHG (MISCELLANEOUS) ×1
BRUSH SCRUB EZ 4% CHG (MISCELLANEOUS) ×1 IMPLANT
CHLORAPREP W/TINT 26 (MISCELLANEOUS) ×1 IMPLANT
CLIP SPRNG 6 S-JAW DBL (CLIP) ×1 IMPLANT
CLIP SPRNG 6MM S-JAW DBL (CLIP) ×1
DERMABOND ADVANCED .7 DNX12 (GAUZE/BANDAGES/DRESSINGS) ×1 IMPLANT
ELECT CAUTERY BLADE 6.4 (BLADE) ×1 IMPLANT
ELECT REM PT RETURN 9FT ADLT (ELECTROSURGICAL) ×1
ELECTRODE REM PT RTRN 9FT ADLT (ELECTROSURGICAL) ×1 IMPLANT
GLOVE BIO SURGEON STRL SZ7 (GLOVE) ×2 IMPLANT
GOWN STRL REUS W/ TWL LRG LVL3 (GOWN DISPOSABLE) ×2 IMPLANT
GOWN STRL REUS W/ TWL XL LVL3 (GOWN DISPOSABLE) ×1 IMPLANT
GOWN STRL REUS W/TWL LRG LVL3 (GOWN DISPOSABLE) ×2
GOWN STRL REUS W/TWL XL LVL3 (GOWN DISPOSABLE) ×1
GRAFT COLLAGEN VASCULAR 8X45 (Miscellaneous) IMPLANT
HEMOSTAT SURGICEL 2X3 (HEMOSTASIS) ×1 IMPLANT
IV NS 500ML (IV SOLUTION) ×1
IV NS 500ML BAXH (IV SOLUTION) ×1 IMPLANT
KIT TURNOVER KIT A (KITS) ×1 IMPLANT
LABEL OR SOLS (LABEL) ×1 IMPLANT
LOOP RED MAXI  1X406MM (MISCELLANEOUS) ×1
LOOP VESSEL MAXI 1X406 RED (MISCELLANEOUS) ×1 IMPLANT
LOOP VESSEL MINI 0.8X406 BLUE (MISCELLANEOUS) ×1 IMPLANT
LOOPS BLUE MINI 0.8X406MM (MISCELLANEOUS) ×1
MANIFOLD NEPTUNE II (INSTRUMENTS) ×1 IMPLANT
NDL FILTER BLUNT 18X1 1/2 (NEEDLE) ×1 IMPLANT
NEEDLE FILTER BLUNT 18X1 1/2 (NEEDLE) ×1 IMPLANT
NS IRRIG 500ML POUR BTL (IV SOLUTION) ×1 IMPLANT
PACK EXTREMITY ARMC (MISCELLANEOUS) ×1 IMPLANT
PAD PREP 24X41 OB/GYN DISP (PERSONAL CARE ITEMS) ×1 IMPLANT
SOLUTION CELL SAVER (CLIP) ×1 IMPLANT
SPIKE FLUID TRANSFER (MISCELLANEOUS) ×1 IMPLANT
STOCKINETTE 48X4 2 PLY STRL (GAUZE/BANDAGES/DRESSINGS) ×1 IMPLANT
STOCKINETTE STRL 4IN 9604848 (GAUZE/BANDAGES/DRESSINGS) ×1 IMPLANT
SUT GORETEX CV-6TTC-13 36IN (SUTURE) IMPLANT
SUT MNCRL AB 4-0 PS2 18 (SUTURE) ×1 IMPLANT
SUT PROLENE 6 0 BV (SUTURE) ×4 IMPLANT
SUT SILK 2 0 (SUTURE) ×1
SUT SILK 2 0 SH (SUTURE) ×1 IMPLANT
SUT SILK 2-0 18XBRD TIE 12 (SUTURE) ×1 IMPLANT
SUT SILK 3 0 (SUTURE) ×1
SUT SILK 3-0 18XBRD TIE 12 (SUTURE) ×1 IMPLANT
SUT SILK 4 0 (SUTURE) ×1
SUT SILK 4-0 18XBRD TIE 12 (SUTURE) ×1 IMPLANT
SUT VIC AB 3-0 SH 27 (SUTURE) ×2
SUT VIC AB 3-0 SH 27X BRD (SUTURE) ×1 IMPLANT
SYR 20ML LL LF (SYRINGE) ×1 IMPLANT
SYR 3ML LL SCALE MARK (SYRINGE) ×1 IMPLANT
SYR TOOMEY IRRIG 70ML (MISCELLANEOUS)
SYRINGE TOOMEY IRRIG 70ML (MISCELLANEOUS) IMPLANT
TRAP FLUID SMOKE EVACUATOR (MISCELLANEOUS) ×1 IMPLANT
WATER STERILE IRR 500ML POUR (IV SOLUTION) ×1 IMPLANT

## 2022-09-04 NOTE — Anesthesia Procedure Notes (Signed)
Procedure Name: Intubation Date/Time: 09/04/2022 2:40 PM  Performed by: Kelton Pillar, CRNAPre-anesthesia Checklist: Patient identified, Emergency Drugs available, Suction available and Patient being monitored Patient Re-evaluated:Patient Re-evaluated prior to induction Oxygen Delivery Method: Circle system utilized Preoxygenation: Pre-oxygenation with 100% oxygen Induction Type: IV induction Ventilation: Mask ventilation without difficulty Laryngoscope Size: McGraph and 3 Grade View: Grade I Tube type: Oral Tube size: 7.0 mm Number of attempts: 1 Airway Equipment and Method: Stylet and Oral airway Placement Confirmation: ETT inserted through vocal cords under direct vision, positive ETCO2, breath sounds checked- equal and bilateral and CO2 detector Secured at: 21 cm Tube secured with: Tape Dental Injury: Teeth and Oropharynx as per pre-operative assessment

## 2022-09-04 NOTE — Transfer of Care (Signed)
Immediate Anesthesia Transfer of Care Note  Patient: Danny [name]  Procedure(s) Performed: REVISON OF ARTERIOVENOUS FISTULA (Left)  Patient Location: PACU  Anesthesia Type:General  Level of Consciousness: awake, drowsy, and patient cooperative  Airway & Oxygen Therapy: Patient Spontanous Breathing and Patient connected to face mask oxygen  Post-op Assessment: Report given to RN and Post -op Vital signs reviewed and stable  Post vital signs: Reviewed and stable  Last Vitals:  Vitals Value Taken Time  BP 169/102 09/04/22 1631  Temp 36.1 C 09/04/22 1631  Pulse 78 09/04/22 1633  Resp 15 09/04/22 1633  SpO2 100 % 09/04/22 1633  Vitals shown include unvalidated device data.  Last Pain:  Vitals:   09/04/22 1324  TempSrc: Temporal  PainSc: 0-No pain         Complications: No notable events documented.

## 2022-09-04 NOTE — Anesthesia Preprocedure Evaluation (Addendum)
Anesthesia Evaluation  Patient identified by MRN, date of birth, ID band Patient awake    Reviewed: Allergy & Precautions, NPO status , Patient's Chart, lab work & pertinent test results  History of Anesthesia Complications Negative for: history of anesthetic complications  Airway Mallampati: III  TM Distance: >3 FB Neck ROM: full    Dental  (+) Poor Dentition, Loose, Missing,    Pulmonary sleep apnea and Continuous Positive Airway Pressure Ventilation , COPD   Pulmonary exam normal        Cardiovascular hypertension, On Medications +CHF  Normal cardiovascular exam+ dysrhythmias   Echo 04/2019  IMPRESSIONS     1. The left ventricle has moderately reduced systolic function, with an  ejection fraction of 35-40%. The cavity size was mild to moderately  dilated. There is moderately increased left ventricular wall thickness.  Left ventricular diastolic Doppler  parameters are consistent with impaired relaxation.   2. The right ventricle has mildly reduced systolic function. The cavity  was normal. There is moderately increased right ventricular wall  thickness. Right ventricular systolic pressure is severely elevated with  an estimated pressure of 41.6 mmHg.   3. Left atrial size was moderately dilated.   4. Right atrial size was moderately dilated.   5. Small pericardial effusion.   6. The mitral valve is grossly normal.   7. The tricuspid valve is grossly normal.   8. The aortic valve is tricuspid.   9. The aorta is normal unless otherwise noted.  10. The interatrial septum was not well visualized.      Neuro/Psych  PSYCHIATRIC DISORDERS  Depression    negative neurological ROS     GI/Hepatic negative GI ROS, Neg liver ROS,,,  Endo/Other  diabetes    Renal/GU ESRF and DialysisRenal diseaseLast dialyzed yesterday. K 5.3      Musculoskeletal   Abdominal   Peds  Hematology negative hematology ROS (+)    Anesthesia Other Findings Past Medical History: No date: Diabetes mellitus without complication (HCC) No date: ESRD (end stage renal disease) on dialysis ([place])     Comment:  a.) T-Th-Sat No date: Gout No date: Hypertension No date: LBBB (left bundle branch block) No date: OSA on CPAP  Past Surgical History: No date: AV FISTULA PLACEMENT 08/11/2022: DIALYSIS/PERMA CATHETER INSERTION; Right     Comment:  Procedure: DIALYSIS/PERMA CATHETER INSERTION;  Surgeon:               Algernon Huxley, MD;  Location: [place] [facility];                Service: Cardiovascular;  Laterality: Right; No date: miscellaneous     Comment:  peritoneal dialysis catheter placement and removal     Reproductive/Obstetrics negative OB ROS                             Anesthesia Physical Anesthesia Plan  ASA: 4  Anesthesia Plan: General LMA   Post-op Pain Management: Ofirmev IV (intra-op)* and Dilaudid IV   Induction: Intravenous  PONV Risk Score and Plan: 2 and Dexamethasone, Ondansetron, Midazolam and Treatment may vary due to age or medical condition  Airway Management Planned: LMA  Additional Equipment:   Intra-op Plan:   Post-operative Plan: Extubation in OR  Informed Consent: I have reviewed the patients History and Physical, chart, labs and discussed the procedure including the risks, benefits and alternatives for the proposed anesthesia with the patient or authorized representative  who has indicated his/her understanding and acceptance.     Dental Advisory Given  Plan Discussed with: Anesthesiologist, CRNA and Surgeon  Anesthesia Plan Comments: (Patient consented for risks of anesthesia including but not limited to:  - adverse reactions to medications - damage to eyes, teeth, lips or other oral mucosa - nerve damage due to positioning  - sore throat or hoarseness - Damage to heart, brain, nerves, lungs, other parts of body or loss of life  Patient voiced  understanding.)       Anesthesia Quick Evaluation

## 2022-09-04 NOTE — Anesthesia Postprocedure Evaluation (Signed)
Anesthesia Post Note  Patient: Tamon Parkerson Avila-Nieto  Procedure(s) Performed: REVISON OF ARTERIOVENOUS FISTULA (Left) LIGATION OF ARTERIOVENOUS  FISTULA (Left) INSERTION OF ARTERIOVENOUS (AV) ARTEGRAFT ARM (Left)  Patient location during evaluation: PACU Anesthesia Type: General Level of consciousness: awake and alert Pain management: pain level controlled Vital Signs Assessment: post-procedure vital signs reviewed and stable Respiratory status: spontaneous breathing, nonlabored ventilation, respiratory function stable and patient connected to nasal cannula oxygen Cardiovascular status: blood pressure returned to baseline and stable Postop Assessment: no apparent nausea or vomiting Anesthetic complications: no  No notable events documented.   Last Vitals:  Vitals:   09/04/22 1710 09/04/22 1717  BP: (!) 149/95 (!) 146/79  Pulse: 75 69  Resp: 15 20  Temp:  (!) 36.2 C  SpO2: 100% 97%    Last Pain:  Vitals:   09/04/22 1717  TempSrc: Temporal  PainSc: 0-No pain                 Dimas Millin

## 2022-09-04 NOTE — Interval H&P Note (Signed)
History and Physical Interval Note:  09/04/2022 1:05 PM  [place]  has presented today for surgery, with the diagnosis of ESRD.  The various methods of treatment have been discussed with the patient and family. After consideration of risks, benefits and other options for treatment, the patient has consented to  Procedure(s): REVISON OF ARTERIOVENOUS FISTULA (Left) as a surgical intervention.  The patient's history has been reviewed, patient examined, no change in status, stable for surgery.  I have reviewed the patient's chart and labs.  Questions were answered to the patient's satisfaction.     Leotis Pain

## 2022-09-04 NOTE — Discharge Instructions (Addendum)
CIRUGIA AMBULATORIA       Instruccionnes de alta    Fecha: 09/04/22   1.  Las drogas que se Statistician en su cuerpo [employer], asi            que por las proximas 24 horas usted no debe:   Conducir Scientist, research (medical)) un automovil   Hacer ninguna decision legal   Tomar ninguna bebida alcoholica  2.  A) Manana puede comenzar una dieta regular.  Es mejor que hoy empiece con           liquidos y gradualmente anada comidas solidas.       B) Puede comer cualquier comida que desee pero es mejor empezar con liquidos,                      luego sopitas con galletas saladas y gradualmente llegar a las comidas solidas.  3.  Por favor avise a su medico inmediatamente si usted tiene algun sangrado anormal,       tiene dificultad con la respiracion, enrojecimiento y Social research officer, government en el sitio de la cirugia, [place],       fiebro o dolor que se alivia con [place].  4.  A) Su visita posoperatoria (despues de su operacion) es con el  Dr.   Date                     Time         B)  Por favor llame para hacer la cita posoperatoria.   AMBULATORY SURGERY  DISCHARGE INSTRUCTIONS   The drugs that you were given will stay in your system until tomorrow so for the next 24 hours you should not:  Drive an automobile Make any legal decisions Drink any alcoholic beverage   You may resume regular meals tomorrow.  Today it is better to start with liquids and gradually work up to solid foods.  You may eat anything you prefer, but it is better to start with liquids, then soup and crackers, and gradually work up to solid foods.   Please notify your doctor immediately if you have any unusual bleeding, trouble breathing, redness and pain at the surgery site, drainage, fever, or pain not relieved by medication.    Additional Instructions: Please contact your physician with any problems or Same Day Surgery at [phone], Monday through Friday 6 am to 4 pm, or [facility] number  at [phone].

## 2022-09-04 NOTE — Op Note (Signed)
[place] [facility]   OPERATIVE NOTE   PROCEDURE: Jump graft revision  of aneurysmal left brachiocephalic arteriovenous fistula with 7 mm Artegraft Ligation of aneurysmal left brachiocephalic AV fistula  PRE-OPERATIVE DIAGNOSIS: 1. aneurysmal degeneration of arteriovenous fistula  2. ESRD  POST-OPERATIVE DIAGNOSIS: same as above   SURGEON: Leotis Pain, MD  ASSISTANT(S): Annalee Genta, NP  ANESTHESIA: General  ESTIMATED BLOOD LOSS: 50 cc  FINDING(S): Left brachiocephalic AV fistula aneurysm Strong, palpable thrill at end of the case  SPECIMEN(S): None  INDICATIONS:   Danny [name] is a 66 y.o. male who  presents with aneurysmal degeneration of left arm arteriovenous access.  In order to salvage the fistula and decrease the bleeding complication risks, I recommended a jump graft revision with ligation of the access.  Risk, benefits, and alternatives to access surgery were discussed.  The patient is aware the risks include but are not limited to: bleeding, infection, steal syndrome, nerve damage, ischemic monomelic neuropathy, loss of the access, need for additional procedures, death and stroke.  The patient agrees to proceed forward with the procedure. An assistant was present during the procedure to help facilitate the exposure and expedite the procedure.   DESCRIPTION: After obtaining full informed written consent, the patient was brought back to the operating room and placed supine upon the operating table.  The patient received IV antibiotics prior to induction.  After obtaining adequate anesthesia, the patient was prepped and draped in the standard fashion for the access procedure. The assistant provided retraction and mobilization to help facilitate exposure and expedite the procedure throughout the entire procedure.  This included following suture, using retractors, and optimizing lighting.   As incision was created near the arterial anastomosis prior to  the aneurysmal segment.  The access was encircled with vessel loops and prepared for control.  I then created an incision in the proximal arm beyond the aneurysmal segment and encircled the access there for control with a vessel loop.  I then used the tunneller and tunnelled between the two incisions around the old access.  I brought a 7 mm Artegraft through the tunneller making sure to avoid twisting after marking for orientation.  The patient was then given 3000 units of intravenous heparin.  The access was then controlled and clamped and ligated distally with a silk suture ligature.  I prepared the end nearer the original arterial anastomosis for an anastomosis with the new 7 mm Artegraft.  The anastomosis was created in an end to end fashion with two 6-0 Prolene sutures in the typical fashion.  I then flushed through the new graft and prepared this for the distal anastomosis.  The access was then divided and ligated again with a silk suture ligature.  The distal end was then prepared for anastomosis with the new graft.  An anastomosis was then created with two 6-0 Prolene sutures in the usual fashion.  The graft was flushed and de-aired prior to release of control.  Patch sutures with 6-0 Prolene sutures were used as needed for control of bleeding.  Surgicel topical hemostatic agents were placed and hemostasis was complete.  I then closed the wound with 3-0 Vicryl suture in the subcutaneous space and a 4-0 Monocryl suture was used to close the skin.  Dermabond was placed as a dressing.  The patient was then awakened from anesthesia and taken to the recovery room in stable condition having tolerated the procedure well.    COMPLICATIONS: none  CONDITION: stable  Corene Cornea  Lukah Goswami  09/04/2022, 3:56 PM   This note was created with Dragon Medical transcription system. Any errors in dictation are purely unintentional.

## 2022-09-05 ENCOUNTER — Encounter: Payer: Self-pay | Admitting: Vascular Surgery

## 2022-09-26 ENCOUNTER — Encounter: Payer: Self-pay | Admitting: Emergency Medicine

## 2022-09-26 ENCOUNTER — Encounter (INDEPENDENT_AMBULATORY_CARE_PROVIDER_SITE_OTHER): Payer: Self-pay

## 2022-09-26 ENCOUNTER — Ambulatory Visit (INDEPENDENT_AMBULATORY_CARE_PROVIDER_SITE_OTHER): Payer: Self-pay | Admitting: Nurse Practitioner

## 2022-09-26 ENCOUNTER — Emergency Department
Admission: EM | Admit: 2022-09-26 | Discharge: 2022-09-26 | Discharge status: Against Medical Advice | Payer: Self-pay | Attending: Student | Admitting: Student

## 2022-09-26 DIAGNOSIS — Z992 Dependence on renal dialysis: Secondary | ICD-10-CM | POA: Insufficient documentation

## 2022-09-26 DIAGNOSIS — R531 Weakness: Secondary | ICD-10-CM | POA: Insufficient documentation

## 2022-09-26 DIAGNOSIS — E162 Hypoglycemia, unspecified: Secondary | ICD-10-CM | POA: Insufficient documentation

## 2022-09-26 DIAGNOSIS — Z5321 Procedure and treatment not carried out due to patient leaving prior to being seen by health care provider: Secondary | ICD-10-CM | POA: Insufficient documentation

## 2022-09-26 LAB — BASIC METABOLIC PANEL
Anion gap: 16 — ABNORMAL HIGH (ref 5–15)
BUN: 43 mg/dL — ABNORMAL HIGH (ref 8–23)
CO2: 23 mmol/L (ref 22–32)
Calcium: 8 mg/dL — ABNORMAL LOW (ref 8.9–10.3)
Chloride: 96 mmol/L — ABNORMAL LOW (ref 98–111)
Creatinine, Ser: 6.79 mg/dL — ABNORMAL HIGH (ref 0.61–1.24)
GFR, Estimated: 8 mL/min — ABNORMAL LOW (ref >60)
Glucose, Bld: 87 mg/dL (ref 70–99)
Potassium: 3.9 mmol/L (ref 3.5–5.1)
Sodium: 135 mmol/L (ref 135–145)

## 2022-09-26 LAB — CBC WITH DIFFERENTIAL/PLATELET
Abs Immature Granulocytes: 0.01 10*3/uL (ref 0.00–0.07)
Basophils Absolute: 0 10*3/uL (ref 0.0–0.1)
Basophils Relative: 1 %
Eosinophils Absolute: 0 10*3/uL (ref 0.0–0.5)
Eosinophils Relative: 1 %
HCT: 30.8 % — ABNORMAL LOW (ref 39.0–52.0)
Hemoglobin: 10.3 g/dL — ABNORMAL LOW (ref 13.0–17.0)
Immature Granulocytes: 0 %
Lymphocytes Relative: 2 %
Lymphs Abs: 0.1 10*3/uL — ABNORMAL LOW (ref 0.7–4.0)
MCH: 32.4 pg (ref 26.0–34.0)
MCHC: 33.4 g/dL (ref 30.0–36.0)
MCV: 96.9 fL (ref 80.0–100.0)
Monocytes Absolute: 0.2 10*3/uL (ref 0.1–1.0)
Monocytes Relative: 4 %
Neutro Abs: 3.5 10*3/uL (ref 1.7–7.7)
Neutrophils Relative %: 92 %
Platelets: 132 10*3/uL — ABNORMAL LOW (ref 150–400)
RBC: 3.18 MIL/uL — ABNORMAL LOW (ref 4.22–5.81)
RDW: 14.5 % (ref 11.5–15.5)
WBC: 3.8 10*3/uL — ABNORMAL LOW (ref 4.0–10.5)
nRBC: 0 % (ref 0.0–0.2)

## 2022-09-26 LAB — CBG MONITORING, ED: Glucose-Capillary: 93 mg/dL (ref 70–99)

## 2022-09-26 NOTE — ED Triage Notes (Signed)
Patient in recliner in triage with daughter stating patients blood sugar dropped in dialysis today. States he had a cookie however feels like he is still disorientated. Patient having diarrhea since Monday. CBG 93 in triage.

## 2022-09-26 NOTE — ED Triage Notes (Signed)
Pt comes via EMs from home with c/ increased weakness. Pt did have dialysis completed today. Pt did also have CBG drop per family to 88. CBG with EMs 103 Per family AMS started today.

## 2022-09-26 NOTE — ED Provider Triage Note (Signed)
Emergency Medicine Provider Triage Evaluation Note  [facility] Avila-Nieto , a 66 y.o. male  was evaluated in triage.  Pt complains of hypoglycemia after dialysis. Did not eat today. Did not take his insulin today. Completed dialysis. Was normal before dialysis. Had diarrhea for 4 days. No fevers. Is better after eating a cookie.  Review of Systems  Positive: Confusion, diarrhea Negative: fever  Physical Exam  There were no vitals taken for this visit. Gen:   Awake, no distress   Resp:  Normal effort  MSK:   Moves extremities without difficulty  Other:    Medical Decision Making  Medically screening exam initiated at 1:13 PM.  Appropriate orders placed.  [facility] Tennis Must Avila-Nieto was informed that the remainder of the evaluation will be completed by another provider, this initial triage assessment does not replace that evaluation, and the importance of remaining in the ED until their evaluation is complete.     Marquette Old, PA-C 09/26/22 1315

## 2022-10-09 ENCOUNTER — Other Ambulatory Visit (INDEPENDENT_AMBULATORY_CARE_PROVIDER_SITE_OTHER): Payer: Self-pay | Admitting: Vascular Surgery

## 2022-10-09 DIAGNOSIS — N186 End stage renal disease: Secondary | ICD-10-CM

## 2022-10-16 ENCOUNTER — Ambulatory Visit (INDEPENDENT_AMBULATORY_CARE_PROVIDER_SITE_OTHER): Payer: Self-pay | Admitting: Nurse Practitioner

## 2022-10-16 ENCOUNTER — Encounter (INDEPENDENT_AMBULATORY_CARE_PROVIDER_SITE_OTHER): Payer: Self-pay

## 2022-10-24 DIAGNOSIS — Z992 Dependence on renal dialysis: Principal | ICD-10-CM

## 2022-10-24 DIAGNOSIS — N186 End stage renal disease: Principal | ICD-10-CM

## 2022-10-24 MED ORDER — INSULIN ASPAR PROT-INSULIN ASPART 100 UNIT/ML (70-30) SUBCUTANEOUS PEN
Freq: Every day | SUBCUTANEOUS | 3 refills | 100 days
Start: 2022-10-24 — End: 2023-10-24

## 2022-10-24 MED ORDER — SEVELAMER CARBONATE 800 MG TABLET
ORAL_TABLET | 11 refills | 0 days
Start: 2022-10-24 — End: 2023-10-24

## 2022-10-27 MED ORDER — INSULIN ASPAR PROT-INSULIN ASPART 100 UNIT/ML (70-30) SUBCUTANEOUS PEN
Freq: Every day | SUBCUTANEOUS | 3 refills | 100 days
Start: 2022-10-27 — End: 2023-10-27

## 2022-10-27 MED ORDER — SEVELAMER CARBONATE 800 MG TABLET
ORAL_TABLET | 11 refills | 0 days
Start: 2022-10-27 — End: 2023-10-27

## 2022-10-28 MED FILL — CINACALCET 60 MG TABLET: ORAL | 30 days supply | Qty: 30 | Fill #0

## 2022-10-28 MED FILL — CARVEDILOL 3.125 MG TABLET: ORAL | 90 days supply | Qty: 360 | Fill #0

## 2022-10-28 MED FILL — TRAZODONE 50 MG TABLET: ORAL | 90 days supply | Qty: 90 | Fill #0

## 2022-11-05 DIAGNOSIS — I10 Essential (primary) hypertension: Principal | ICD-10-CM

## 2022-11-05 MED ORDER — CARVEDILOL 3.125 MG TABLET
ORAL_TABLET | Freq: Two times a day (BID) | ORAL | 3 refills | 45 days | Status: CP
Start: 2022-11-05 — End: ?
  Filled 2023-01-08: qty 360, 90d supply, fill #0

## 2022-11-05 MED ORDER — AMLODIPINE 5 MG TABLET
ORAL_TABLET | Freq: Every evening | ORAL | 3 refills | 90 days | Status: CP
Start: 2022-11-05 — End: ?
  Filled 2022-11-05: qty 90, 90d supply, fill #0

## 2022-12-02 DIAGNOSIS — Z992 Dependence on renal dialysis: Principal | ICD-10-CM

## 2022-12-02 DIAGNOSIS — N186 End stage renal disease: Principal | ICD-10-CM

## 2022-12-02 MED ORDER — SEVELAMER CARBONATE 800 MG TABLET
ORAL_TABLET | 11 refills | 0 days
Start: 2022-12-02 — End: 2023-12-02

## 2022-12-02 MED ORDER — INSULIN ASPAR PROT-INSULIN ASPART 100 UNIT/ML (70-30) SUBCUTANEOUS PEN
Freq: Every day | SUBCUTANEOUS | 3 refills | 100 days
Start: 2022-12-02 — End: 2023-12-02

## 2022-12-04 MED ORDER — INSULIN ASPAR PROT-INSULIN ASPART 100 UNIT/ML (70-30) SUBCUTANEOUS PEN
Freq: Every day | SUBCUTANEOUS | 3 refills | 100 days
Start: 2022-12-04 — End: 2023-12-04

## 2022-12-04 MED ORDER — SEVELAMER CARBONATE 800 MG TABLET
ORAL_TABLET | 11 refills | 0 days
Start: 2022-12-04 — End: 2023-12-04

## 2022-12-08 MED FILL — CINACALCET 60 MG TABLET: ORAL | 30 days supply | Qty: 30 | Fill #1

## 2022-12-12 ENCOUNTER — Ambulatory Visit: Admit: 2022-12-12 | Discharge: 2022-12-12

## 2022-12-12 DIAGNOSIS — R42 Dizziness and giddiness: Principal | ICD-10-CM

## 2022-12-12 DIAGNOSIS — Z992 Dependence on renal dialysis: Principal | ICD-10-CM

## 2022-12-12 DIAGNOSIS — Z1211 Encounter for screening for malignant neoplasm of colon: Principal | ICD-10-CM

## 2022-12-12 DIAGNOSIS — F329 Major depressive disorder, single episode, unspecified: Principal | ICD-10-CM

## 2022-12-12 DIAGNOSIS — Z794 Long term (current) use of insulin: Principal | ICD-10-CM

## 2022-12-12 DIAGNOSIS — Z125 Encounter for screening for malignant neoplasm of prostate: Principal | ICD-10-CM

## 2022-12-12 DIAGNOSIS — H547 Unspecified visual loss: Principal | ICD-10-CM

## 2022-12-12 DIAGNOSIS — E119 Type 2 diabetes mellitus without complications: Principal | ICD-10-CM

## 2022-12-12 DIAGNOSIS — N186 End stage renal disease: Principal | ICD-10-CM

## 2022-12-12 DIAGNOSIS — E11319 Type 2 diabetes mellitus with unspecified diabetic retinopathy without macular edema: Principal | ICD-10-CM

## 2022-12-15 MED ORDER — INSULIN ASPAR PROT-INSULIN ASPART 100 UNIT/ML (70-30) SUBCUTANEOUS PEN
SUBCUTANEOUS | 11 refills | 30 days | Status: CP
Start: 2022-12-15 — End: 2023-12-15

## 2022-12-31 DIAGNOSIS — E1169 Type 2 diabetes mellitus with other specified complication: Principal | ICD-10-CM

## 2022-12-31 DIAGNOSIS — F329 Major depressive disorder, single episode, unspecified: Principal | ICD-10-CM

## 2022-12-31 DIAGNOSIS — Z794 Long term (current) use of insulin: Principal | ICD-10-CM

## 2022-12-31 MED ORDER — GABAPENTIN 300 MG CAPSULE
ORAL_CAPSULE | Freq: Every day | ORAL | 11 refills | 30 days
Start: 2022-12-31 — End: ?

## 2022-12-31 MED ORDER — INSULIN ASPAR PROT-INSULIN ASPART 100 UNIT/ML (70-30) SUBCUTANEOUS PEN
SUBCUTANEOUS | 11 refills | 30 days
Start: 2022-12-31 — End: 2023-12-31

## 2022-12-31 MED ORDER — TRAZODONE 50 MG TABLET
ORAL_TABLET | Freq: Every evening | ORAL | 3 refills | 90 days
Start: 2022-12-31 — End: 2023-12-26

## 2023-01-01 DIAGNOSIS — E1169 Type 2 diabetes mellitus with other specified complication: Principal | ICD-10-CM

## 2023-01-01 DIAGNOSIS — Z794 Long term (current) use of insulin: Principal | ICD-10-CM

## 2023-01-01 MED ORDER — GABAPENTIN 300 MG CAPSULE
ORAL_CAPSULE | Freq: Every day | ORAL | 11 refills | 30 days
Start: 2023-01-01 — End: ?

## 2023-01-01 MED ORDER — INSULIN ASPAR PROT-INSULIN ASPART 100 UNIT/ML (70-30) SUBCUTANEOUS PEN
Freq: Every day | SUBCUTANEOUS | 3 refills | 100 days
Start: 2023-01-01 — End: 2024-01-01

## 2023-01-01 MED ORDER — NOVOLOG MIX 70-30 FLEXPEN U-100 INSULIN 100 UNIT/ML SUBCUTANEOUS PEN
3 refills | 0 days
Start: 2023-01-01 — End: ?

## 2023-01-01 MED FILL — CINACALCET 60 MG TABLET: ORAL | 30 days supply | Qty: 30 | Fill #2

## 2023-01-02 MED ORDER — NOVOLOG MIX 70-30 FLEXPEN U-100 INSULIN 100 UNIT/ML SUBCUTANEOUS PEN
3 refills | 0 days
Start: 2023-01-02 — End: ?

## 2023-01-02 MED ORDER — INSULIN ASPAR PROT-INSULIN ASPART 100 UNIT/ML (70-30) SUBCUTANEOUS PEN
Freq: Every day | SUBCUTANEOUS | 3 refills | 100 days
Start: 2023-01-02 — End: 2024-01-01

## 2023-01-05 MED ORDER — TRAZODONE 50 MG TABLET
ORAL_TABLET | Freq: Every evening | ORAL | 3 refills | 90 days
Start: 2023-01-05 — End: 2023-12-31

## 2023-01-05 MED ORDER — GABAPENTIN 300 MG CAPSULE
ORAL_CAPSULE | Freq: Every day | ORAL | 11 refills | 30 days
Start: 2023-01-05 — End: ?

## 2023-01-05 MED ORDER — INSULIN ASPAR PROT-INSULIN ASPART 100 UNIT/ML (70-30) SUBCUTANEOUS PEN
SUBCUTANEOUS | 11 refills | 30 days | Status: CP
Start: 2023-01-05 — End: 2024-01-05
  Filled 2023-01-08: qty 15, 137d supply, fill #0

## 2023-01-08 MED FILL — TRAZODONE 50 MG TABLET: ORAL | 90 days supply | Qty: 90 | Fill #1

## 2023-01-28 DIAGNOSIS — N186 End stage renal disease: Principal | ICD-10-CM

## 2023-02-03 ENCOUNTER — Ambulatory Visit: Admit: 2023-02-03 | Discharge: 2023-02-04

## 2023-02-07 DIAGNOSIS — R7881 Bacteremia: Principal | ICD-10-CM

## 2023-02-07 MED ORDER — LEVOFLOXACIN 250 MG TABLET
ORAL_TABLET | Freq: Every day | ORAL | 0 refills | 21 days | Status: CP
Start: 2023-02-07 — End: 2023-02-28

## 2023-02-09 ENCOUNTER — Telehealth (INDEPENDENT_AMBULATORY_CARE_PROVIDER_SITE_OTHER): Payer: Self-pay

## 2023-02-09 ENCOUNTER — Ambulatory Visit: Admit: 2023-02-09 | Payer: MEDICARE

## 2023-02-09 ENCOUNTER — Ambulatory Visit
Admit: 2023-02-09 | Discharge: 2023-02-18 | Disposition: A | Discharge status: Home / Self Care | Payer: MEDICARE | Admitting: Internal Medicine

## 2023-02-09 ENCOUNTER — Encounter: Admit: 2023-02-09 | Payer: MEDICARE | Attending: Adult Health

## 2023-02-09 NOTE — Telephone Encounter (Signed)
This was scheduled earlier this morning and the information will be sent to [facility] at [facility] for the patient.

## 2023-02-09 NOTE — Telephone Encounter (Unsigned)
A fax was received from [facility] to schedule patient for a permcath exchange. I contacted Misty and was told the patient is using his cath with no issues but I does need to be exchanged, at the time I was attempted to schedule him for today with Dr. Wyn Quaker. Patient had already left for home and most likely eaten as well. Patient is scheduled on 02/12/23 with a 1:30 pm arrival time to the [facility]. This information will be sent to [facility] at [facility].

## 2023-02-09 NOTE — Telephone Encounter (Signed)
Please see above

## 2023-02-09 NOTE — Telephone Encounter (Signed)
Misty from [facility] called stating the patient needs an appointment to his catheter changed because he has an infection.  Please advise.

## 2023-02-10 NOTE — Telephone Encounter (Signed)
Misty from [facility] called to cancel the patient's permcath exchange on 02/12/23 with Dr. Wyn Quaker. Patient has been canceled.

## 2023-02-12 ENCOUNTER — Encounter: Admission: RE | Payer: Self-pay | Source: Home / Self Care

## 2023-02-12 ENCOUNTER — Ambulatory Visit: Admission: RE | Admit: 2023-02-12 | Payer: Self-pay | Source: Home / Self Care | Admitting: Vascular Surgery

## 2023-02-12 DIAGNOSIS — N186 End stage renal disease: Secondary | ICD-10-CM

## 2023-02-12 SURGERY — DIALYSIS/PERMA CATHETER INSERTION
Anesthesia: Moderate Sedation

## 2023-02-18 MED ORDER — SULFAMETHOXAZOLE 800 MG-TRIMETHOPRIM 160 MG TABLET
ORAL_TABLET | ORAL | 0 refills | 22.00000 days | Status: CP
Start: 2023-02-18 — End: 2023-03-12

## 2023-02-18 MED ORDER — CINACALCET 60 MG TABLET
ORAL_TABLET | Freq: Every day | ORAL | 3 refills | 90 days
Start: 2023-02-18 — End: ?

## 2023-02-18 MED ORDER — CARVEDILOL 6.25 MG TABLET
ORAL_TABLET | Freq: Two times a day (BID) | ORAL | 0 refills | 30 days | Status: CN
Start: 2023-02-18 — End: 2023-03-20

## 2023-02-18 MED ORDER — CARVEDILOL 3.125 MG TABLET
ORAL_TABLET | Freq: Two times a day (BID) | ORAL | 0 refills | 30 days | Status: CP
Start: 2023-02-18 — End: 2023-03-20

## 2023-02-19 DIAGNOSIS — Z09 Encounter for follow-up examination after completed treatment for conditions other than malignant neoplasm: Principal | ICD-10-CM

## 2023-02-25 MED ORDER — CINACALCET 60 MG TABLET
ORAL_TABLET | Freq: Every day | ORAL | 3 refills | 90 days
Start: 2023-02-25 — End: ?

## 2023-02-25 MED FILL — AMLODIPINE 5 MG TABLET: ORAL | 90 days supply | Qty: 90 | Fill #1

## 2023-03-04 DIAGNOSIS — I10 Essential (primary) hypertension: Principal | ICD-10-CM

## 2023-03-04 MED ORDER — CINACALCET 60 MG TABLET
ORAL_TABLET | Freq: Every day | ORAL | 3 refills | 90 days
Start: 2023-03-04 — End: ?

## 2023-03-04 MED ORDER — CARVEDILOL 3.125 MG TABLET
ORAL_TABLET | Freq: Two times a day (BID) | ORAL | 0 refills | 30 days
Start: 2023-03-04 — End: 2023-04-03

## 2023-03-05 MED ORDER — CARVEDILOL 3.125 MG TABLET
ORAL_TABLET | Freq: Two times a day (BID) | ORAL | 2 refills | 90 days | Status: CP
Start: 2023-03-05 — End: 2023-11-30
  Filled 2023-04-16: qty 360, 90d supply, fill #0

## 2023-03-05 MED FILL — NOVOLOG MIX 70-30 FLEXPEN U-100 INSULIN 100 UNIT/ML SUBCUTANEOUS PEN: SUBCUTANEOUS | 137 days supply | Qty: 15 | Fill #1

## 2023-03-06 MED ORDER — CINACALCET 60 MG TABLET
ORAL_TABLET | Freq: Every day | ORAL | 3 refills | 90 days
Start: 2023-03-06 — End: ?

## 2023-03-12 ENCOUNTER — Ambulatory Visit: Admit: 2023-03-12 | Discharge: 2023-03-12

## 2023-03-12 DIAGNOSIS — I5020 Unspecified systolic (congestive) heart failure: Principal | ICD-10-CM

## 2023-03-12 DIAGNOSIS — Z01818 Encounter for other preprocedural examination: Principal | ICD-10-CM

## 2023-03-12 DIAGNOSIS — Z794 Long term (current) use of insulin: Principal | ICD-10-CM

## 2023-03-12 DIAGNOSIS — E1122 Type 2 diabetes mellitus with diabetic chronic kidney disease: Principal | ICD-10-CM

## 2023-03-12 DIAGNOSIS — F329 Major depressive disorder, single episode, unspecified: Principal | ICD-10-CM

## 2023-03-12 DIAGNOSIS — N186 End stage renal disease: Principal | ICD-10-CM

## 2023-03-12 DIAGNOSIS — Z09 Encounter for follow-up examination after completed treatment for conditions other than malignant neoplasm: Principal | ICD-10-CM

## 2023-03-12 DIAGNOSIS — H547 Unspecified visual loss: Principal | ICD-10-CM

## 2023-03-12 DIAGNOSIS — I10 Essential (primary) hypertension: Principal | ICD-10-CM

## 2023-03-12 DIAGNOSIS — Z992 Dependence on renal dialysis: Principal | ICD-10-CM

## 2023-03-12 DIAGNOSIS — Z9181 History of falling: Principal | ICD-10-CM

## 2023-03-12 DIAGNOSIS — R42 Dizziness and giddiness: Principal | ICD-10-CM

## 2023-03-12 DIAGNOSIS — I959 Hypotension, unspecified: Principal | ICD-10-CM

## 2023-03-12 DIAGNOSIS — Z1211 Encounter for screening for malignant neoplasm of colon: Principal | ICD-10-CM

## 2023-03-12 MED ORDER — ATORVASTATIN 20 MG TABLET
ORAL_TABLET | Freq: Every day | ORAL | 3 refills | 90 days | Status: CP
Start: 2023-03-12 — End: 2024-03-11
  Filled 2023-04-16: qty 90, 90d supply, fill #0

## 2023-03-13 ENCOUNTER — Ambulatory Visit: Admit: 2023-03-13 | Discharge: 2023-03-17

## 2023-03-27 ENCOUNTER — Ambulatory Visit: Admit: 2023-03-27 | Discharge: 2023-03-27

## 2023-03-27 ENCOUNTER — Encounter: Admit: 2023-03-27 | Discharge: 2023-03-27 | Attending: Anesthesiology | Primary: Anesthesiology

## 2023-03-27 MED ORDER — OXYCODONE 5 MG TABLET
ORAL_TABLET | ORAL | 0 refills | 2 days | Status: CP | PRN
Start: 2023-03-27 — End: 2023-04-01

## 2023-04-14 ENCOUNTER — Ambulatory Visit: Admit: 2023-04-14 | Discharge: 2023-04-15

## 2023-04-14 MED ORDER — CINACALCET 60 MG TABLET
ORAL_TABLET | Freq: Every day | ORAL | 3 refills | 90 days
Start: 2023-04-14 — End: ?

## 2023-04-16 MED ORDER — CINACALCET 60 MG TABLET
ORAL_TABLET | Freq: Every day | ORAL | 3 refills | 90 days | Status: CP
Start: 2023-04-16 — End: ?
  Filled 2023-04-16: qty 30, 30d supply, fill #0

## 2023-04-28 ENCOUNTER — Ambulatory Visit
Admit: 2023-04-28 | Discharge: 2023-04-29 | Attending: Student in an Organized Health Care Education/Training Program | Primary: Student in an Organized Health Care Education/Training Program

## 2023-04-28 DIAGNOSIS — E7849 Other hyperlipidemia: Principal | ICD-10-CM

## 2023-04-28 DIAGNOSIS — N186 End stage renal disease: Principal | ICD-10-CM

## 2023-04-28 DIAGNOSIS — Z992 Dependence on renal dialysis: Principal | ICD-10-CM

## 2023-04-28 DIAGNOSIS — I5020 Unspecified systolic (congestive) heart failure: Principal | ICD-10-CM

## 2023-04-28 DIAGNOSIS — I10 Essential (primary) hypertension: Principal | ICD-10-CM

## 2023-04-28 DIAGNOSIS — I447 Left bundle-branch block, unspecified: Principal | ICD-10-CM

## 2023-04-28 DIAGNOSIS — E875 Hyperkalemia: Principal | ICD-10-CM

## 2023-04-28 MED ORDER — LOSARTAN 25 MG TABLET
ORAL | 3 refills | 90 days | Status: CP
Start: 2023-04-28 — End: 2024-04-27
  Filled 2023-04-30: qty 90, 90d supply, fill #0

## 2023-04-28 MED ORDER — CARVEDILOL 12.5 MG TABLET
ORAL_TABLET | Freq: Two times a day (BID) | ORAL | 3 refills | 90 days | Status: CP
Start: 2023-04-28 — End: 2024-04-27
  Filled 2023-04-30: qty 180, 90d supply, fill #0

## 2023-05-10 DIAGNOSIS — N186 End stage renal disease: Principal | ICD-10-CM

## 2023-05-10 DIAGNOSIS — Z992 Dependence on renal dialysis: Principal | ICD-10-CM

## 2023-05-12 ENCOUNTER — Ambulatory Visit: Admit: 2023-05-12 | Discharge: 2023-05-13

## 2023-05-12 DIAGNOSIS — I5020 Unspecified systolic (congestive) heart failure: Principal | ICD-10-CM

## 2023-05-13 DIAGNOSIS — N186 End stage renal disease: Principal | ICD-10-CM

## 2023-05-13 DIAGNOSIS — Z992 Dependence on renal dialysis: Principal | ICD-10-CM

## 2023-05-13 MED ORDER — SEVELAMER CARBONATE 800 MG TABLET
ORAL_TABLET | Freq: Three times a day (TID) | ORAL | 11 refills | 0.00000 days | Status: CP
Start: 2023-05-13 — End: 2024-05-12
  Filled 2023-05-15: qty 360, 30d supply, fill #0

## 2023-05-14 MED ORDER — SEVELAMER CARBONATE 800 MG TABLET
ORAL_TABLET | 11 refills | 0 days
Start: 2023-05-14 — End: 2024-05-13

## 2023-05-15 MED FILL — CINACALCET 60 MG TABLET: ORAL | 30 days supply | Qty: 30 | Fill #1

## 2023-05-21 ENCOUNTER — Ambulatory Visit: Admit: 2023-05-21 | Discharge: 2023-05-21

## 2023-05-21 DIAGNOSIS — N186 End stage renal disease: Principal | ICD-10-CM

## 2023-05-21 DIAGNOSIS — Z992 Dependence on renal dialysis: Principal | ICD-10-CM

## 2023-06-12 ENCOUNTER — Telehealth: Admit: 2023-06-12 | Discharge: 2023-06-13

## 2023-06-12 DIAGNOSIS — Z794 Long term (current) use of insulin: Principal | ICD-10-CM

## 2023-06-12 DIAGNOSIS — Z992 Dependence on renal dialysis: Principal | ICD-10-CM

## 2023-06-12 DIAGNOSIS — E1122 Type 2 diabetes mellitus with diabetic chronic kidney disease: Principal | ICD-10-CM

## 2023-06-12 DIAGNOSIS — M65349 Trigger finger, unspecified ring finger: Principal | ICD-10-CM

## 2023-06-12 DIAGNOSIS — N186 End stage renal disease: Principal | ICD-10-CM

## 2023-06-16 ENCOUNTER — Ambulatory Visit
Admit: 2023-06-16 | Discharge: 2023-06-17 | Attending: Student in an Organized Health Care Education/Training Program | Primary: Student in an Organized Health Care Education/Training Program

## 2023-06-16 DIAGNOSIS — I10 Essential (primary) hypertension: Principal | ICD-10-CM

## 2023-06-16 DIAGNOSIS — E7849 Other hyperlipidemia: Principal | ICD-10-CM

## 2023-06-16 DIAGNOSIS — I5020 Unspecified systolic (congestive) heart failure: Principal | ICD-10-CM

## 2023-06-16 DIAGNOSIS — N186 End stage renal disease: Principal | ICD-10-CM

## 2023-06-16 DIAGNOSIS — I440 Atrioventricular block, first degree: Principal | ICD-10-CM

## 2023-06-16 DIAGNOSIS — L089 Local infection of the skin and subcutaneous tissue, unspecified: Principal | ICD-10-CM

## 2023-06-16 DIAGNOSIS — I447 Left bundle-branch block, unspecified: Principal | ICD-10-CM

## 2023-06-16 MED ORDER — CARVEDILOL 25 MG TABLET
ORAL_TABLET | Freq: Two times a day (BID) | ORAL | 3 refills | 90 days | Status: CP
Start: 2023-06-16 — End: 2024-06-15
  Filled 2023-07-24: qty 180, 90d supply, fill #0

## 2023-06-16 MED ORDER — MUPIROCIN 2 % TOPICAL OINTMENT
Freq: Two times a day (BID) | TOPICAL | 1 refills | 0 days | Status: CP
Start: 2023-06-16 — End: 2023-06-26

## 2023-06-22 MED FILL — CINACALCET 60 MG TABLET: ORAL | 30 days supply | Qty: 30 | Fill #2

## 2023-06-22 MED FILL — SEVELAMER CARBONATE 800 MG TABLET: ORAL | 30 days supply | Qty: 360 | Fill #1

## 2023-06-25 ENCOUNTER — Ambulatory Visit
Admit: 2023-06-25 | Discharge: 2023-06-26 | Attending: Student in an Organized Health Care Education/Training Program | Primary: Student in an Organized Health Care Education/Training Program

## 2023-07-16 ENCOUNTER — Ambulatory Visit: Admit: 2023-07-16 | Discharge: 2023-07-17

## 2023-07-24 MED FILL — CINACALCET 60 MG TABLET: ORAL | 27 days supply | Qty: 27 | Fill #3

## 2023-07-24 MED FILL — SEVELAMER CARBONATE 800 MG TABLET: ORAL | 30 days supply | Qty: 360 | Fill #2

## 2023-07-24 MED FILL — LOSARTAN 25 MG TABLET: ORAL | 90 days supply | Qty: 90 | Fill #1

## 2023-07-24 MED FILL — ATORVASTATIN 20 MG TABLET: ORAL | 90 days supply | Qty: 90 | Fill #1

## 2023-08-06 ENCOUNTER — Ambulatory Visit: Admit: 2023-08-06

## 2023-08-07 ENCOUNTER — Ambulatory Visit: Admit: 2023-08-07

## 2023-08-11 ENCOUNTER — Ambulatory Visit: Admit: 2023-08-11 | Discharge: 2023-08-12

## 2023-08-11 ENCOUNTER — Ambulatory Visit
Admit: 2023-08-11 | Discharge: 2023-08-12 | Attending: Student in an Organized Health Care Education/Training Program | Primary: Student in an Organized Health Care Education/Training Program

## 2023-08-12 MED ORDER — CINACALCET 60 MG TABLET
ORAL_TABLET | ORAL | 3 refills | 210 days | Status: CP
Start: 2023-08-12 — End: ?
  Filled 2023-08-24: qty 12, 28d supply, fill #0

## 2023-08-24 MED FILL — SEVELAMER CARBONATE 800 MG TABLET: ORAL | 30 days supply | Qty: 360 | Fill #3

## 2023-09-08 ENCOUNTER — Ambulatory Visit: Admit: 2023-09-08

## 2023-09-11 ENCOUNTER — Ambulatory Visit: Admit: 2023-09-11 | Discharge: 2023-09-12 | Attending: Internal Medicine | Primary: Internal Medicine

## 2023-09-15 ENCOUNTER — Ambulatory Visit
Admit: 2023-09-15 | Attending: Student in an Organized Health Care Education/Training Program | Primary: Student in an Organized Health Care Education/Training Program

## 2023-09-18 ENCOUNTER — Ambulatory Visit: Admit: 2023-09-18 | Discharge: 2023-09-19

## 2023-09-18 DIAGNOSIS — Z794 Long term (current) use of insulin: Principal | ICD-10-CM

## 2023-09-18 DIAGNOSIS — Z01818 Encounter for other preprocedural examination: Principal | ICD-10-CM

## 2023-09-18 DIAGNOSIS — E1122 Type 2 diabetes mellitus with diabetic chronic kidney disease: Principal | ICD-10-CM

## 2023-09-18 DIAGNOSIS — Z992 Dependence on renal dialysis: Principal | ICD-10-CM

## 2023-09-18 DIAGNOSIS — I10 Essential (primary) hypertension: Principal | ICD-10-CM

## 2023-09-18 DIAGNOSIS — N186 End stage renal disease: Principal | ICD-10-CM

## 2023-09-18 DIAGNOSIS — I5020 Unspecified systolic (congestive) heart failure: Principal | ICD-10-CM

## 2023-09-28 MED FILL — CINACALCET 60 MG TABLET: ORAL | 28 days supply | Qty: 12 | Fill #1

## 2023-09-28 MED FILL — SEVELAMER CARBONATE 800 MG TABLET: ORAL | 30 days supply | Qty: 360 | Fill #4

## 2023-10-01 ENCOUNTER — Inpatient Hospital Stay: Admit: 2023-10-01 | Discharge: 2023-10-01

## 2023-10-01 ENCOUNTER — Ambulatory Visit: Admit: 2023-10-01 | Discharge: 2023-10-01

## 2023-10-01 MED ORDER — OXYCODONE 5 MG TABLET
ORAL_TABLET | Freq: Four times a day (QID) | ORAL | 0 refills | 4.00 days | Status: CP | PRN
Start: 2023-10-01 — End: 2023-10-06
  Filled 2023-10-01: qty 15, 4d supply, fill #0

## 2023-10-02 ENCOUNTER — Ambulatory Visit: Admit: 2023-10-02 | Discharge: 2023-10-03

## 2023-10-05 MED FILL — NOVOLOG MIX 70-30 FLEXPEN U-100 INSULIN 100 UNIT/ML SUBCUTANEOUS PEN: SUBCUTANEOUS | 137 days supply | Qty: 15 | Fill #2

## 2023-10-15 ENCOUNTER — Ambulatory Visit: Admit: 2023-10-15 | Discharge: 2023-10-16

## 2023-12-09 ENCOUNTER — Encounter: Admit: 2023-12-09 | Discharge: 2023-12-09 | Attending: Internal Medicine | Primary: Internal Medicine

## 2023-12-09 MED ORDER — CINACALCET 60 MG TABLET
ORAL_TABLET | Freq: Every day | ORAL | 11 refills | 30.00 days | Status: CP
Start: 2023-12-09 — End: 2024-12-08
  Filled 2024-03-08: qty 30, 30d supply, fill #0

## 2023-12-11 ENCOUNTER — Encounter: Admit: 2023-12-11 | Discharge: 2023-12-11 | Attending: Internal Medicine | Primary: Internal Medicine

## 2023-12-21 MED ORDER — SEVELAMER CARBONATE 800 MG TABLET
ORAL_TABLET | 3 refills | 0 days
Start: 2023-12-21 — End: ?

## 2023-12-29 ENCOUNTER — Ambulatory Visit
Admit: 2023-12-29 | Discharge: 2023-12-30 | Attending: Student in an Organized Health Care Education/Training Program | Primary: Student in an Organized Health Care Education/Training Program

## 2023-12-29 DIAGNOSIS — E1122 Type 2 diabetes mellitus with diabetic chronic kidney disease: Principal | ICD-10-CM

## 2023-12-29 DIAGNOSIS — I5020 Unspecified systolic (congestive) heart failure: Principal | ICD-10-CM

## 2023-12-29 DIAGNOSIS — Z794 Long term (current) use of insulin: Principal | ICD-10-CM

## 2023-12-29 DIAGNOSIS — I2510 Atherosclerotic heart disease of native coronary artery without angina pectoris: Principal | ICD-10-CM

## 2023-12-29 DIAGNOSIS — Z992 Dependence on renal dialysis: Principal | ICD-10-CM

## 2023-12-29 DIAGNOSIS — E7849 Other hyperlipidemia: Principal | ICD-10-CM

## 2023-12-29 DIAGNOSIS — N186 End stage renal disease: Principal | ICD-10-CM

## 2023-12-29 DIAGNOSIS — E785 Hyperlipidemia, unspecified: Principal | ICD-10-CM

## 2023-12-29 DIAGNOSIS — I447 Left bundle-branch block, unspecified: Principal | ICD-10-CM

## 2023-12-29 DIAGNOSIS — I10 Essential (primary) hypertension: Principal | ICD-10-CM

## 2023-12-29 DIAGNOSIS — Z95810 Presence of automatic (implantable) cardiac defibrillator: Principal | ICD-10-CM

## 2023-12-29 MED ORDER — LOSARTAN 50 MG TABLET
ORAL_TABLET | Freq: Every day | ORAL | 3 refills | 90 days | Status: CP
Start: 2023-12-29 — End: 2024-12-28
  Filled 2024-03-08: qty 90, 90d supply, fill #0

## 2023-12-29 MED ORDER — ATORVASTATIN 20 MG TABLET
ORAL_TABLET | Freq: Every day | ORAL | 3 refills | 90 days | Status: CP
Start: 2023-12-29 — End: 2024-12-28
  Filled 2024-03-08: qty 90, 90d supply, fill #0

## 2023-12-30 ENCOUNTER — Ambulatory Visit: Admit: 2023-12-30 | Attending: Nurse Practitioner | Primary: Nurse Practitioner

## 2024-01-12 ENCOUNTER — Ambulatory Visit: Admit: 2024-01-12 | Discharge: 2024-01-13

## 2024-02-10 MED ORDER — LANTHANUM 1,000 MG CHEWABLE TABLET
ORAL_TABLET | Freq: Three times a day (TID) | ORAL | 11 refills | 30.00000 days | Status: CP
Start: 2024-02-10 — End: 2025-02-09

## 2024-03-04 DIAGNOSIS — Z794 Long term (current) use of insulin: Principal | ICD-10-CM

## 2024-03-04 DIAGNOSIS — E1169 Type 2 diabetes mellitus with other specified complication: Principal | ICD-10-CM

## 2024-03-04 MED ORDER — INSULIN ASPAR PROT-INSULIN ASPART 100 UNIT/ML (70-30) SUBCUTANEOUS PEN
SUBCUTANEOUS | 2 refills | 137.00000 days | Status: CP
Start: 2024-03-04 — End: 2025-03-04
  Filled 2024-03-08: qty 15, 137d supply, fill #0

## 2024-03-08 MED FILL — LANTHANUM 1,000 MG CHEWABLE TABLET: ORAL | 30 days supply | Qty: 90 | Fill #0

## 2024-03-08 MED FILL — CARVEDILOL 25 MG TABLET: ORAL | 90 days supply | Qty: 180 | Fill #1

## 2024-03-09 ENCOUNTER — Encounter: Admit: 2024-03-09 | Discharge: 2024-03-09 | Attending: Internal Medicine | Primary: Internal Medicine

## 2024-03-11 ENCOUNTER — Encounter: Admit: 2024-03-11 | Discharge: 2024-03-11 | Attending: Internal Medicine | Primary: Internal Medicine

## 2024-03-14 ENCOUNTER — Other Ambulatory Visit: Payer: Self-pay

## 2024-03-14 ENCOUNTER — Emergency Department: Payer: Self-pay

## 2024-03-14 ENCOUNTER — Inpatient Hospital Stay
Admission: EM | Admit: 2024-03-14 | Discharge: 2024-03-15 | DRG: 291 | Disposition: A | Discharge status: Home / Self Care | Payer: Self-pay | Attending: Pulmonary Disease | Admitting: Pulmonary Disease

## 2024-03-14 ENCOUNTER — Encounter: Payer: Self-pay | Admitting: Emergency Medicine

## 2024-03-14 DIAGNOSIS — I132 Hypertensive heart and chronic kidney disease with heart failure and with stage 5 chronic kidney disease, or end stage renal disease: Principal | ICD-10-CM | POA: Diagnosis present

## 2024-03-14 DIAGNOSIS — N2581 Secondary hyperparathyroidism of renal origin: Secondary | ICD-10-CM | POA: Diagnosis present

## 2024-03-14 DIAGNOSIS — Z794 Long term (current) use of insulin: Secondary | ICD-10-CM

## 2024-03-14 DIAGNOSIS — Z86718 Personal history of other venous thrombosis and embolism: Secondary | ICD-10-CM

## 2024-03-14 DIAGNOSIS — Z7982 Long term (current) use of aspirin: Secondary | ICD-10-CM

## 2024-03-14 DIAGNOSIS — Z91158 Patient's noncompliance with renal dialysis for other reason: Secondary | ICD-10-CM

## 2024-03-14 DIAGNOSIS — Z95810 Presence of automatic (implantable) cardiac defibrillator: Secondary | ICD-10-CM

## 2024-03-14 DIAGNOSIS — E1140 Type 2 diabetes mellitus with diabetic neuropathy, unspecified: Secondary | ICD-10-CM | POA: Diagnosis present

## 2024-03-14 DIAGNOSIS — Z992 Dependence on renal dialysis: Secondary | ICD-10-CM

## 2024-03-14 DIAGNOSIS — D696 Thrombocytopenia, unspecified: Secondary | ICD-10-CM | POA: Diagnosis present

## 2024-03-14 DIAGNOSIS — R110 Nausea: Principal | ICD-10-CM

## 2024-03-14 DIAGNOSIS — I428 Other cardiomyopathies: Secondary | ICD-10-CM | POA: Diagnosis present

## 2024-03-14 DIAGNOSIS — F419 Anxiety disorder, unspecified: Secondary | ICD-10-CM | POA: Diagnosis present

## 2024-03-14 DIAGNOSIS — D631 Anemia in chronic kidney disease: Secondary | ICD-10-CM | POA: Diagnosis present

## 2024-03-14 DIAGNOSIS — I340 Nonrheumatic mitral (valve) insufficiency: Secondary | ICD-10-CM | POA: Diagnosis present

## 2024-03-14 DIAGNOSIS — F329 Major depressive disorder, single episode, unspecified: Secondary | ICD-10-CM | POA: Diagnosis present

## 2024-03-14 DIAGNOSIS — I5023 Acute on chronic systolic (congestive) heart failure: Secondary | ICD-10-CM | POA: Diagnosis present

## 2024-03-14 DIAGNOSIS — R0602 Shortness of breath: Secondary | ICD-10-CM

## 2024-03-14 DIAGNOSIS — G4733 Obstructive sleep apnea (adult) (pediatric): Secondary | ICD-10-CM | POA: Diagnosis present

## 2024-03-14 DIAGNOSIS — E875 Hyperkalemia: Secondary | ICD-10-CM | POA: Diagnosis present

## 2024-03-14 DIAGNOSIS — N186 End stage renal disease: Secondary | ICD-10-CM | POA: Diagnosis present

## 2024-03-14 DIAGNOSIS — N19 Unspecified kidney failure: Secondary | ICD-10-CM

## 2024-03-14 DIAGNOSIS — Z833 Family history of diabetes mellitus: Secondary | ICD-10-CM

## 2024-03-14 DIAGNOSIS — Z841 Family history of disorders of kidney and ureter: Secondary | ICD-10-CM

## 2024-03-14 DIAGNOSIS — E785 Hyperlipidemia, unspecified: Secondary | ICD-10-CM | POA: Diagnosis present

## 2024-03-14 DIAGNOSIS — Z79899 Other long term (current) drug therapy: Secondary | ICD-10-CM

## 2024-03-14 DIAGNOSIS — E872 Acidosis, unspecified: Secondary | ICD-10-CM | POA: Diagnosis present

## 2024-03-14 DIAGNOSIS — E1122 Type 2 diabetes mellitus with diabetic chronic kidney disease: Secondary | ICD-10-CM | POA: Diagnosis present

## 2024-03-14 LAB — TROPONIN I (HIGH SENSITIVITY)
Troponin I (High Sensitivity): 35 ng/L — ABNORMAL HIGH (ref <18)
Troponin I (High Sensitivity): 38 ng/L — ABNORMAL HIGH (ref <18)

## 2024-03-14 LAB — COMPREHENSIVE METABOLIC PANEL WITH GFR
ALT: 12 U/L (ref 0–44)
AST: 15 U/L (ref 15–41)
Albumin: 3.8 g/dL (ref 3.5–5.0)
Alkaline Phosphatase: 98 U/L (ref 38–126)
Anion gap: 18 — ABNORMAL HIGH (ref 5–15)
BUN: 105 mg/dL — ABNORMAL HIGH (ref 8–23)
CO2: 24 mmol/L (ref 22–32)
Calcium: 7.4 mg/dL — ABNORMAL LOW (ref 8.9–10.3)
Chloride: 91 mmol/L — ABNORMAL LOW (ref 98–111)
Creatinine, Ser: 10.71 mg/dL — ABNORMAL HIGH (ref 0.61–1.24)
GFR, Estimated: 5 mL/min — ABNORMAL LOW (ref >60)
Glucose, Bld: 116 mg/dL — ABNORMAL HIGH (ref 70–99)
Potassium: >7.5 mmol/L (ref 3.5–5.1)
Sodium: 133 mmol/L — ABNORMAL LOW (ref 135–145)
Total Bilirubin: 1.2 mg/dL (ref 0.0–1.2)
Total Protein: 7.3 g/dL (ref 6.5–8.1)

## 2024-03-14 LAB — POTASSIUM: Potassium: 7.3 mmol/L (ref 3.5–5.1)

## 2024-03-14 LAB — HEPATITIS B SURFACE ANTIGEN: Hepatitis B Surface Ag: NONREACTIVE

## 2024-03-14 LAB — BASIC METABOLIC PANEL WITH GFR
Anion gap: 15 (ref 5–15)
BUN: 57 mg/dL — ABNORMAL HIGH (ref 8–23)
CO2: 25 mmol/L (ref 22–32)
Calcium: 7.8 mg/dL — ABNORMAL LOW (ref 8.9–10.3)
Chloride: 93 mmol/L — ABNORMAL LOW (ref 98–111)
Creatinine, Ser: 5.98 mg/dL — ABNORMAL HIGH (ref 0.61–1.24)
GFR, Estimated: 10 mL/min — ABNORMAL LOW (ref >60)
Glucose, Bld: 82 mg/dL (ref 70–99)
Potassium: 4.2 mmol/L (ref 3.5–5.1)
Sodium: 133 mmol/L — ABNORMAL LOW (ref 135–145)

## 2024-03-14 LAB — CBC
HCT: 26.9 % — ABNORMAL LOW (ref 39.0–52.0)
Hemoglobin: 8.8 g/dL — ABNORMAL LOW (ref 13.0–17.0)
MCH: 32.8 pg (ref 26.0–34.0)
MCHC: 32.7 g/dL (ref 30.0–36.0)
MCV: 100.4 fL — ABNORMAL HIGH (ref 80.0–100.0)
Platelets: 112 K/uL — ABNORMAL LOW (ref 150–400)
RBC: 2.68 MIL/uL — ABNORMAL LOW (ref 4.22–5.81)
RDW: 14.7 % (ref 11.5–15.5)
WBC: 4.3 K/uL (ref 4.0–10.5)
nRBC: 0 % (ref 0.0–0.2)

## 2024-03-14 LAB — LACTIC ACID, PLASMA
Lactic Acid, Venous: 1.1 mmol/L (ref 0.5–1.9)
Lactic Acid, Venous: 0.9 mmol/L (ref 0.5–1.9)

## 2024-03-14 LAB — GLUCOSE, CAPILLARY
Glucose-Capillary: 91 mg/dL (ref 70–99)
Glucose-Capillary: 64 mg/dL — ABNORMAL LOW (ref 70–99)
Glucose-Capillary: 115 mg/dL — ABNORMAL HIGH (ref 70–99)
Glucose-Capillary: 51 mg/dL — ABNORMAL LOW (ref 70–99)
Glucose-Capillary: 118 mg/dL — ABNORMAL HIGH (ref 70–99)
Glucose-Capillary: 155 mg/dL — ABNORMAL HIGH (ref 70–99)
Glucose-Capillary: 89 mg/dL (ref 70–99)

## 2024-03-14 LAB — HEMOGLOBIN A1C
Hgb A1c MFr Bld: 5.8 % — ABNORMAL HIGH (ref 4.8–5.6)
Mean Plasma Glucose: 119.76 mg/dL

## 2024-03-14 LAB — MRSA NEXT GEN BY PCR, NASAL: MRSA by PCR Next Gen: NOT DETECTED

## 2024-03-14 LAB — HIV ANTIBODY (ROUTINE TESTING W REFLEX): HIV Screen 4th Generation wRfx: NONREACTIVE

## 2024-03-14 LAB — LIPASE, BLOOD: Lipase: 27 U/L (ref 11–51)

## 2024-03-14 LAB — CBG MONITORING, ED: Glucose-Capillary: 155 mg/dL — ABNORMAL HIGH (ref 70–99)

## 2024-03-14 LAB — MAGNESIUM: Magnesium: 3.2 mg/dL — ABNORMAL HIGH (ref 1.7–2.4)

## 2024-03-14 LAB — PROCALCITONIN: Procalcitonin: 0.23 ng/mL

## 2024-03-14 LAB — BRAIN NATRIURETIC PEPTIDE: B Natriuretic Peptide: 2169.9 pg/mL — ABNORMAL HIGH (ref 0.0–100.0)

## 2024-03-14 MED ORDER — PATIROMER SORBITEX CALCIUM 8.4 G PO PACK
8.4000 g | PACK | Freq: Every day | ORAL | Status: DC
  Administered 2024-03-14 – 2024-03-15 (×2): 8.4 g via ORAL
  Filled 2024-03-14 (×2): qty 1

## 2024-03-14 MED ORDER — NEPRO/CARBSTEADY PO LIQD
237.0000 mL | Freq: Three times a day (TID) | ORAL | Status: DC
  Administered 2024-03-15: 237 mL via ORAL

## 2024-03-14 MED ORDER — EPOETIN ALFA-EPBX 4000 UNIT/ML IJ SOLN: 4000.0000 [IU] | INTRAMUSCULAR | Status: DC

## 2024-03-14 MED ORDER — RENA-VITE PO TABS: 1.0000 | ORAL_TABLET | Freq: Every day | ORAL | Status: DC

## 2024-03-14 MED ORDER — INSULIN ASPART 100 UNIT/ML IV SOLN
10.0000 [IU] | Freq: Once | INTRAVENOUS | Status: AC
  Administered 2024-03-14: 10 [IU] via INTRAVENOUS
  Filled 2024-03-14: qty 0.1

## 2024-03-14 MED ORDER — INSULIN ASPART 100 UNIT/ML IJ SOLN
0.0000 [IU] | INTRAMUSCULAR | Status: DC
  Administered 2024-03-14 (×2): 1 [IU] via SUBCUTANEOUS
  Filled 2024-03-14 (×2): qty 1

## 2024-03-14 MED ORDER — SODIUM BICARBONATE 8.4 % IV SOLN
50.0000 meq | Freq: Once | INTRAVENOUS | Status: AC
  Administered 2024-03-14: 50 meq via INTRAVENOUS
  Filled 2024-03-14: qty 50

## 2024-03-14 MED ORDER — ONDANSETRON 4 MG PO TBDP
4.0000 mg | ORAL_TABLET | Freq: Once | ORAL | Status: AC
  Administered 2024-03-14: 4 mg via ORAL
  Filled 2024-03-14: qty 1

## 2024-03-14 MED ORDER — DEXTROSE 50 % IV SOLN
12.5000 g | INTRAVENOUS | Status: AC
  Administered 2024-03-14: 12.5 g via INTRAVENOUS

## 2024-03-14 MED ORDER — CALCIUM GLUCONATE-NACL 1-0.675 GM/50ML-% IV SOLN
1.0000 g | Freq: Once | INTRAVENOUS | Status: AC
  Administered 2024-03-14: 1000 mg via INTRAVENOUS
  Filled 2024-03-14: qty 50

## 2024-03-14 MED ORDER — DOCUSATE SODIUM 100 MG PO CAPS: 100.0000 mg | ORAL_CAPSULE | Freq: Two times a day (BID) | ORAL | Status: DC | PRN

## 2024-03-14 MED ORDER — EPOETIN ALFA-EPBX 4000 UNIT/ML IJ SOLN
4000.0000 [IU] | INTRAMUSCULAR | Status: DC
  Administered 2024-03-14: 4000 [IU] via INTRAVENOUS
  Filled 2024-03-14: qty 1

## 2024-03-14 MED ORDER — HEPARIN SODIUM (PORCINE) 5000 UNIT/ML IJ SOLN
5000.0000 [IU] | Freq: Three times a day (TID) | INTRAMUSCULAR | Status: DC
  Administered 2024-03-14 – 2024-03-15 (×4): 5000 [IU] via SUBCUTANEOUS
  Filled 2024-03-14 (×4): qty 1

## 2024-03-14 MED ORDER — DEXTROSE 50 % IV SOLN
50.0000 mL | Freq: Once | INTRAVENOUS | Status: AC
  Administered 2024-03-14: 50 mL via INTRAVENOUS
  Filled 2024-03-14: qty 50

## 2024-03-14 MED ORDER — DEXTROSE 50 % IV SOLN
1.0000 | Freq: Once | INTRAVENOUS | Status: AC
  Administered 2024-03-14: 50 mL via INTRAVENOUS
  Filled 2024-03-14: qty 50

## 2024-03-14 MED ORDER — DEXTROSE 50 % IV SOLN
INTRAVENOUS | Status: AC
  Administered 2024-03-14: 50 mL
  Filled 2024-03-14: qty 50

## 2024-03-14 MED ORDER — RENA-VITE PO TABS
1.0000 | ORAL_TABLET | Freq: Every day | ORAL | Status: DC
  Administered 2024-03-14: 1
  Filled 2024-03-14: qty 1

## 2024-03-14 MED ORDER — CHLORHEXIDINE GLUCONATE CLOTH 2 % EX PADS
6.0000 | MEDICATED_PAD | Freq: Every day | CUTANEOUS | Status: DC
  Administered 2024-03-14 – 2024-03-15 (×2): 6 via TOPICAL
  Filled 2024-03-14: qty 6

## 2024-03-14 MED ORDER — DEXTROSE 50 % IV SOLN
INTRAVENOUS | Status: AC
  Filled 2024-03-14: qty 50

## 2024-03-14 MED ORDER — POLYETHYLENE GLYCOL 3350 17 G PO PACK: 17.0000 g | PACK | Freq: Every day | ORAL | Status: DC | PRN

## 2024-03-14 MED ORDER — DEXTROSE 50 % IV SOLN: 25.0000 g | INTRAVENOUS | Status: AC

## 2024-03-14 MED ORDER — INSULIN ASPART 100 UNIT/ML IJ SOLN
8.0000 [IU] | Freq: Once | INTRAMUSCULAR | Status: AC
  Administered 2024-03-14: 8 [IU] via SUBCUTANEOUS
  Filled 2024-03-14: qty 1

## 2024-03-14 MED ORDER — ALBUTEROL SULFATE (2.5 MG/3ML) 0.083% IN NEBU
2.5000 mg | INHALATION_SOLUTION | Freq: Once | RESPIRATORY_TRACT | Status: AC
  Administered 2024-03-14: 2.5 mg via RESPIRATORY_TRACT
  Filled 2024-03-14: qty 3

## 2024-03-14 NOTE — Progress Notes (Addendum)
 Report received from [facility]. Patient recent arrival to unit from ED. MD Zelda Hickman at bedside to assess patient. Patient in need of HD s/t missing two treatments d/t diarrhea at home. Due to staffing HD unable to come to bedside to complete treatment at this time. Zelda Hickman, this RN and AD Plummer discussed HD availability and ICU level of care protocol. HD to come to bedside this afternoon at earliest availability. New order placed for potassium draw following treatment in ED for K 7.6 with calcium  gluconate and dextrose . Lab confirmed add on of K lab to recently drawn troponin with this RN.  Wife and daughter at bedside supporting patient appropriately. All questions answered within nursing scope of practice.

## 2024-03-14 NOTE — Progress Notes (Addendum)
 Critical of K 7.3 called from lab. ICU MD, ICU NP and HD MD Zelda Hickman all aware. HD to start at bedside after lunch per HD report to Bambi Lever by this RN. New orders placed.

## 2024-03-14 NOTE — Progress Notes (Signed)
 Attempted to get report from ED nurse; wi ll return the call back to this RN when available.

## 2024-03-14 NOTE — ED Triage Notes (Signed)
 Pt arrived via POV with family reports he has missed dialysis W and F of this past week, due to having diarrhea, pt has c/o dizziness, poor PO intake. + dry heaving, Denies any pain.

## 2024-03-14 NOTE — Plan of Care (Signed)
  Problem: Education: Goal: Ability to describe self-care measures that may prevent or decrease complications (Diabetes Survival Skills Education) will improve Outcome: Progressing   Problem: Coping: Goal: Ability to adjust to condition or change in health will improve Outcome: Progressing   Problem: Health Behavior/Discharge Planning: Goal: Ability to identify and utilize available resources and services will improve Outcome: Progressing   Problem: Metabolic: Goal: Ability to maintain appropriate glucose levels will improve Outcome: Progressing   Problem: Education: Goal: Knowledge of General Education information will improve Description: Including pain rating scale, medication(s)/side effects and non-pharmacologic comfort measures Outcome: Progressing

## 2024-03-14 NOTE — Progress Notes (Signed)
 PHARMACY CONSULT NOTE - ELECTROLYTES  Pharmacy Consult for Electrolyte Monitoring and Replacement   Recent Labs: Height: 5' 9 (175.3 cm) Weight: 86.2 kg (190 lb) IBW/kg (Calculated) : 70.7 Estimated Creatinine Clearance: 7.3 mL/min (A) (by C-G formula based on SCr of 10.71 mg/dL (H)). Potassium (mmol/L)  Date Value  03/14/2024 >7.5 (HH)  09/10/2013 3.4 (L)   Magnesium  (mg/dL)  Date Value  69/62/9528 3.2 (H)   Calcium  (mg/dL)  Date Value  41/32/4401 7.4 (L)   Calcium , Total (mg/dL)  Date Value  02/72/5366 8.5   Albumin (g/dL)  Date Value  44/11/4740 3.8  09/10/2013 2.6 (L)   Phosphorus (mg/dL)  Date Value  59/56/3875 5.3 (H)   Sodium (mmol/L)  Date Value  03/14/2024 133 (L)  09/10/2013 128 (L)   Corrected Ca: 7.6 mg/dL   ( Ca 7.4  albumin 3.8)  Assessment  Danny [name] is a 68 y.o. male presenting with nausea, dizziness, and shortness of breath as a result of missed 2 sessions of HD last week. Aaron Aas PMH significant for ESRD on HD on M/W/F, HTN, T2DM, HFrEF, LVEF 20% LBBB, first-degree AV block s/p biventricular ICD, anemia of chronic disease, CLABSI,, anxiety and depression, diabetic neuropathy nephropathy, AV fistula thrombosis . Pharmacy has been consulted to monitor and replace electrolytes.  Diet: renal carb MIVF: none Pertinent medications:  Veltassa 8.4gm daily  (Losartan PTA)  Goal of Therapy: Electrolytes WNL  Plan:  K >7.5   Veltass ordered, nephrology consult for HD since missed 2 HD Corrected Ca 7.6   received calcium  gluconate 1 gm x 1 (for high K) No replacement at this time Check BMP, Mg, Phos with AM labs  Thank you for allowing pharmacy to be a part of this patient's care.  Scotty Cyphers, PharmD Clinical Pharmacist 03/14/2024 7:58 AM

## 2024-03-14 NOTE — Progress Notes (Signed)
 Hemodialysis Note:  Received patient in bed. Alert and oriented. Informed consent singed and in chart.  Treatment initiated: 1407 Treatment completed: 1740  Access used: Left AVF Access issues: None  Patient tolerated well, alert without acute distress. Report given to patient's RN.  Total UF removed: 2 liters Medications given: Retacrit  4000 units IV  Post HD weight: 95 kg  [facility]

## 2024-03-14 NOTE — ED Notes (Signed)
 This Rn called to give report on pt. This Rn told that there was a Charity fundraiser call out and that the charge RN will be calling back soon. Charge RN [employer] notified.

## 2024-03-14 NOTE — ED Provider Notes (Signed)
 [facility] Provider Note    Event Date/Time   First MD Initiated Contact with Patient 03/14/24 0448     (approximate)   History   Shortness of breath   HPI  Tele-spanishinterpreter not working; family member interprets for patient  Danny [name] is a 68 y.o. male who presents to the ED from home with a chief complaint of nausea, dizziness and shortness of breath.  Patient with a history of ESRD on dialysis M/W/F who missed 2 dialysis sessions last week secondary to having diarrhea so it has been a full week since he was last dialyzed.  Endorses chest tightness.  Denies fever/chills, abdominal pain, vomiting.  Does not make urine.     Past Medical History   Past Medical History:  Diagnosis Date   Diabetes mellitus without complication (HCC)    ESRD (end stage renal disease) on dialysis (HCC)    a.) T-Th-Sat   Gout    Hypertension    LBBB (left bundle branch block)    OSA on CPAP      Active Problem List   Patient Active Problem List   Diagnosis Date Noted   ESRD needing dialysis (HCC) 03/14/2024   Hemodialysis AV fistula aneurysm, initial encounter (HCC) 08/11/2022   Nausea vomiting and diarrhea 02/19/2021   Depression 02/19/2021   Abdominal pain 02/19/2021   Elevated troponin 02/19/2021   Fall at home, initial encounter 02/19/2021   ESRD (end stage renal disease) on dialysis (HCC) 02/19/2021   Chronic systolic CHF (congestive heart failure) (HCC) 02/19/2021   Diabetes mellitus with ESRD (end-stage renal disease) (HCC) 08/01/2019   Diarrhea 08/01/2019   Hypertension    Chronic combined systolic and diastolic CHF (congestive heart failure) (HCC)    Volume overload    Acute diarrhea 07/27/2018   Viral gastroenteritis 10/04/2016   Anemia 10/04/2016   Thrombocytopenia (HCC) 10/04/2016   Rectal bleeding 10/04/2016   Hyperkalemia 10/02/2016   Pulmonary edema 05/13/2016     Past Surgical History   Past Surgical History:   Procedure Laterality Date   AV FISTULA PLACEMENT     DIALYSIS/PERMA CATHETER INSERTION Right 08/11/2022   Procedure: DIALYSIS/PERMA CATHETER INSERTION;  Surgeon: Celso College, MD;  Location: [facility] INVASIVE CV LAB;  Service: Cardiovascular;  Laterality: Right;   INSERTION OF ARTERIOVENOUS (AV) ARTEGRAFT ARM Left 09/04/2022   Procedure: INSERTION OF ARTERIOVENOUS (AV) ARTEGRAFT ARM;  Surgeon: Celso College, MD;  Location: [facility] ORS;  Service: Vascular;  Laterality: Left;   LIGATION OF ARTERIOVENOUS  FISTULA Left 09/04/2022   Procedure: LIGATION OF ARTERIOVENOUS  FISTULA;  Surgeon: Celso College, MD;  Location: [facility] ORS;  Service: Vascular;  Laterality: Left;   miscellaneous     peritoneal dialysis catheter placement and removal   REVISON OF ARTERIOVENOUS FISTULA Left 09/04/2022   Procedure: REVISON OF ARTERIOVENOUS FISTULA;  Surgeon: Celso College, MD;  Location: [facility] ORS;  Service: Vascular;  Laterality: Left;     Home Medications   Prior to Admission medications   Medication Sig Start Date End Date Taking? Authorizing Provider  albuterol  (VENTOLIN  HFA) 108 (90 Base) MCG/ACT inhaler Inhale 2 puffs into the lungs every 6 (six) hours as needed for wheezing. 07/19/18   [provider]  amLODipine  (NORVASC ) 5 MG tablet Take 5 mg by mouth at bedtime. 11/05/18   [provider]  aspirin  81 MG EC tablet Take 1 tablet by mouth daily. Patient not taking: Reported on 08/29/2022 08/01/16   [provider]  calcitRIOL  (ROCALTROL ) 0.25 MCG capsule Take 1 capsule by mouth daily. 12/28/15   [provider]  calcium  acetate (PHOSLO ) 667 MG capsule Take 2,001 mg by mouth 3 (three) times daily with meals.     [provider]  carvedilol  (COREG ) 3.125 MG tablet Take 3.125 mg by mouth 2 (two) times daily with a meal.    [provider]  Cholecalciferol (VITAMIN D-3) 125 MCG (5000 UT) TABS Take by mouth.    [provider]  cinacalcet  (SENSIPAR ) 30 MG tablet  Take 1 tablet by mouth daily. Patient not taking: Reported on 08/09/2022 04/10/20   [provider]  citalopram  (CELEXA ) 20 MG tablet Take 20 mg by mouth daily.  Patient not taking: Reported on 02/19/2021    [provider]  furosemide  (LASIX ) 40 MG tablet Take 1 tablet by mouth daily. Patient not taking: Reported on 08/29/2022 12/28/15   [provider]  gabapentin  (NEURONTIN ) 300 MG capsule Take 300 mg by mouth daily. 04/08/19   [provider]  insulin  aspart protamine- aspart (NOVOLOG  MIX 70/30) (70-30) 100 UNIT/ML injection Inject 0.05-0.1 mLs (5-10 Units total) into the skin See admin instructions. 5 units every morning and 4 units every evening 02/21/21   Wouk, Haynes Lips, MD  lisinopril  (ZESTRIL ) 20 MG tablet Take 0.5 tablets (10 mg total) by mouth daily. 02/21/21   Wouk, Haynes Lips, MD  loperamide  (IMODIUM ) 2 MG capsule Take 1 capsule (2 mg total) by mouth every 6 (six) hours as needed for diarrhea or loose stools. Patient not taking: Reported on 08/29/2022 07/29/18   Sainani, Vivek J, MD  multivitamin (RENA-VIT) TABS tablet Take 1 tablet by mouth at bedtime. Patient not taking: Reported on 02/19/2021 08/04/19   Swayze, Ava, DO  Nutritional Supplements (FEEDING SUPPLEMENT, NEPRO CARB STEADY,) LIQD Take 237 mLs by mouth 2 (two) times daily between meals. Patient not taking: Reported on 08/29/2022 08/05/19   Swayze, Ava, DO  omeprazole  (PRILOSEC  OTC) 20 MG tablet Take 20 mg by mouth daily.    [provider]  sevelamer  carbonate (RENVELA ) 800 MG tablet Take 800-3,200 mg by mouth See admin instructions. Take 4 tablets (3200mg ) by mouth three times daily with meals Patient not taking: Reported on 08/09/2022    [provider]  sodium zirconium cyclosilicate  (LOKELMA ) 5 g packet Take 5 g by mouth daily. Patient not taking: Reported on 08/09/2022 02/21/21   Janeane Mealy, MD  traZODone  (DESYREL ) 50 MG tablet Take 1 tablet by mouth at  bedtime. Patient not taking: Reported on 08/29/2022 04/13/20 02/26/21  [provider]     Allergies  Patient has no known allergies.   Family History   Family History  Problem Relation Age of Onset   Diabetes Mother    Prostate cancer Father    Kidney disease Sister      Physical Exam  Triage Vital Signs: ED Triage Vitals  Encounter Vitals Group     BP 03/14/24 0421 (!) 145/72     Girls Systolic BP Percentile --      Girls Diastolic BP Percentile --      Boys Systolic BP Percentile --      Boys Diastolic BP Percentile --      Pulse Rate 03/14/24 0421 65     Resp 03/14/24 0421 18     Temp 03/14/24 0421 97.8 F (36.6 C)     Temp Source 03/14/24 0421 Oral     SpO2 03/14/24 0421 100 %  Weight 03/14/24 0421 190 lb (86.2 kg)     Height 03/14/24 0421 5' 9 (1.753 m)     Head Circumference --      Peak Flow --      Pain Score 03/14/24 0445 0     Pain Loc --      Pain Education --      Exclude from Growth Chart --     Updated Vital Signs: BP 129/75   Pulse 71   Temp 97.8 F (36.6 C) (Oral)   Resp 18   Ht 5' 9 (1.753 m)   Wt 86.2 kg   SpO2 97%   BMI 28.06 kg/m    General: Awake, moderate distress. Ill-appearing. CV:  RRR.  Good peripheral perfusion.  Resp:  Normal effort.  Bibasilar rales. Abd:  Nontender.  No distention.  Other:  BLE pedal edema.   ED Results / Procedures / Treatments  Labs (all labs ordered are listed, but only abnormal results are displayed) Labs Reviewed  COMPREHENSIVE METABOLIC PANEL WITH GFR - Abnormal; Notable for the following components:      Result Value   Sodium 133 (*)    Potassium >7.5 (*)    Chloride 91 (*)    Glucose, Bld 116 (*)    BUN 105 (*)    Creatinine, Ser 10.71 (*)    Calcium  7.4 (*)    GFR, Estimated 5 (*)    Anion gap 18 (*)    All other components within normal limits  CBC - Abnormal; Notable for the following components:   RBC 2.68 (*)    Hemoglobin 8.8 (*)    HCT 26.9 (*)    MCV 100.4 (*)     Platelets 112 (*)    All other components within normal limits  CULTURE, BLOOD (ROUTINE X 2)  CULTURE, BLOOD (ROUTINE X 2)  MRSA NEXT GEN BY PCR, NASAL  URINALYSIS, ROUTINE W REFLEX MICROSCOPIC  BRAIN NATRIURETIC PEPTIDE  LIPASE, BLOOD  HEPATITIS B SURFACE ANTIGEN  HEPATITIS B SURFACE ANTIBODY, QUANTITATIVE  LACTIC ACID, PLASMA  LACTIC ACID, PLASMA  PROCALCITONIN  MAGNESIUM   HIV ANTIBODY (ROUTINE TESTING W REFLEX)  CBG MONITORING, ED  TROPONIN I (HIGH SENSITIVITY)     EKG  ED ECG REPORT I, Zayin Valadez J, the attending physician, personally viewed and interpreted this ECG.   Date: 03/14/2024  EKG Time: 0425  Rate: 65  Rhythm: Sinus rhythm  Axis: LAD  Intervals:Near sinusoidal appearance  ST&T Change: Nonspecific    RADIOLOGY I have independently visualized and interpreted patient's imaging study as well as noted the radiology interpretation:  Chest x-ray: Pulmonary vascular congestion  Official radiology report(s): DG Chest Port 1 View Result Date: 03/14/2024 CLINICAL DATA:  Shortness of breath. EXAM: PORTABLE CHEST 1 VIEW COMPARISON:  08/09/2022 FINDINGS: The cardio pericardial silhouette is enlarged. There is pulmonary vascular congestion without overt pulmonary edema. Streaky density at the left base suggest atelectasis. No overt pulmonary edema or pleural effusion. Right-sided permanent pacemaker/AICD is new in the interval. Telemetry leads overlie the chest. IMPRESSION: 1. Enlargement of the cardiopericardial silhouette with pulmonary vascular congestion. 2. Streaky density at the left base suggest atelectasis. Electronically Signed   By: Donnal Fusi M.D.   On: 03/14/2024 05:17     PROCEDURES:  Critical Care performed: Yes, see critical care procedure note(s)  CRITICAL CARE Performed by: Norlene Beavers   Total critical care time: 45 minutes  Critical care time was exclusive of separately billable procedures and treating other patients.  Critical  care was  necessary to treat or prevent imminent or life-threatening deterioration.  Critical care was time spent personally by me on the following activities: development of treatment plan with patient and/or surrogate as well as nursing, discussions with consultants, evaluation of patient's response to treatment, examination of patient, obtaining history from patient or surrogate, ordering and performing treatments and interventions, ordering and review of laboratory studies, ordering and review of radiographic studies, pulse oximetry and re-evaluation of patient's condition.   Aaron Aas1-3 Lead EKG Interpretation  Performed by: Norlene Beavers, MD Authorized by: Norlene Beavers, MD     Interpretation: abnormal     ECG rate:  65   ECG rate assessment: normal     Rhythm: sinus rhythm     Ectopy: aberrant     Conduction: normal   Comments:     Patient placed on cardiac monitor to evaluate for arrhythmias    MEDICATIONS ORDERED IN ED: Medications  calcium  gluconate 1 g/ 50 mL sodium chloride  IVPB (1,000 mg Intravenous New Bag/Given 03/14/24 0524)  insulin  aspart (novoLOG ) injection 8 Units (has no administration in time range)  patiromer Bennye Bravo) packet 8.4 g (has no administration in time range)  albuterol  (PROVENTIL ) (2.5 MG/3ML) 0.083% nebulizer solution 2.5 mg (has no administration in time range)  Chlorhexidine  Gluconate Cloth 2 % PADS 6 each (has no administration in time range)  docusate sodium  (COLACE) capsule 100 mg (has no administration in time range)  polyethylene glycol (MIRALAX / GLYCOLAX) packet 17 g (has no administration in time range)  heparin  injection 5,000 Units (has no administration in time range)  ondansetron  (ZOFRAN -ODT) disintegrating tablet 4 mg (4 mg Oral Given 03/14/24 0500)  sodium bicarbonate  injection 50 mEq (50 mEq Intravenous Given 03/14/24 0523)  dextrose  50 % solution 50 mL (50 mLs Intravenous Given 03/14/24 0559)     IMPRESSION / MDM / ASSESSMENT AND PLAN / ED COURSE  I  reviewed the triage vital signs and the nursing notes.                              69 year old male presenting with nausea, dizziness, shortness of breath after missing 2 dialysis sessions. Differential includes, but is not limited to, viral syndrome, bronchitis including COPD exacerbation, pneumonia, reactive airway disease including asthma, CHF including exacerbation with or without pulmonary/interstitial edema, pneumothorax, ACS, thoracic trauma, and pulmonary embolism.  I personally reviewed patient's records and noted a cardiology office visit from 12/29/2023 for follow-up CHF, diabetes, hyperlipidemia.  Patient's presentation is most consistent with acute presentation with potential threat to life or bodily function.  The patient is on the cardiac monitor to evaluate for evidence of arrhythmia and/or significant heart rate changes.  EKG concerning for suspected hyperkalemia.  Will immediately give IV calcium  gluconate and sodium bicarbonate  pending laboratory results.  Anticipate hospitalization.  Clinical Course as of 03/14/24 0600  Mon Mar 14, 2024  1610 Verbal from lab indicates potassium is greater than 7.5.  Second PIV will be started.  Have added dextrose , insulin  and albuterol  nebulizer.  Discussed with nephrology on-call Dr. Lamount Pimple who will mobilize hemodialysis team for urgent dialysis.  Will discuss with CCU intensivist [place] for admission. [JS]    Clinical Course User Index [JS] Norlene Beavers, MD     FINAL CLINICAL IMPRESSION(S) / ED DIAGNOSES   Final diagnoses:  Nausea  SOB (shortness of breath)  Hyperkalemia  Uremia     Rx / DC Orders  ED Discharge Orders     None        Note:  This document was prepared using Dragon voice recognition software and may include unintentional dictation errors.   Norlene Beavers, MD 03/14/24 0600

## 2024-03-14 NOTE — H&P (Signed)
 NAME:  Danny [name], MRN:  [id number], DOB:  Feb 15, 1956, LOS: 0 ADMISSION DATE:  03/14/2024, CONSULTATION DATE: 03/14/2024 REFERRING MD: Tori Freer, CHIEF COMPLAINT: Nausea, dizziness and shortness of breath  HPI  68 y.o male with significant PMH of  ESRD on HD on M/W/F, HTN, T2DM, HFrEF, LVEF 20% LBBB, first-degree AV block s/p biventricular ICD, anemia of chronic disease, CLABSI,, anxiety and depression, diabetic neuropathy nephropathy, AV fistula thrombosis who presented to the ED with chief complaints of nausea, dizziness, and shortness of breath as a result of missed 2 sessions of HD last week.   ED Course: Initial vital signs showed HR of 65 beats/minute, BP 145/72 mm Hg, the RR 18 breaths/minute, and the oxygen saturation 100% on and a temperature of 97.79F (36.6C).  Pertinent Labs/Diagnostics Findings: Na+/ K+: 133/7.5 Glucose: 116 BUN/Cr.:105/10.71 WBC: 4.3 K/L without bands or neutrophil predominance  Hgb/Hct: 8.8/26.9 Plts: 112 PCT: pending Lactic acid: pending troponin: 35 BNP:  2169.9 CXR> see below Medication administered in the ED: Albuterol , calcium  gluconate, glucose, and insulin  for hyperkalemia correction Disposition: PCCM consulted for ICU admission  Past Medical History  ESRD on HD on M/W/F, HTN, T2DM, HFrEF, LVEF 20% LBBB, first-degree AV block s/p biventricular ICD, anemia of chronic disease, CLABSI, anxiety and depression, diabetic neuropathy nephropathy, AV fistula thrombosis   Significant Hospital Events   6/16: Admit to ICU with hyperkalemia in the setting of missed dialysis  Consults:  Nephrology  Procedures:  None  Significant Diagnostic Tests:  6/16: Chest Xray>  Interim History / Subjective:      Micro Data:  6/16: Blood culture x2> 6/16: MRSA PCR>>   Antimicrobials:  none  OBJECTIVE  Blood pressure 129/75, pulse 71, temperature 97.8 F (36.6 C), temperature source Oral, resp. rate 18, height 5' 9 (1.753 m), weight 86.2  kg, SpO2 97%.       No intake or output data in the 24 hours ending 03/14/24 0547 Filed Weights   03/14/24 0421  Weight: 86.2 kg   Physical Examination  GENERAL: 68 year-old critically ill patient lying in the bed in no acute distress EYES: PEERLA. No scleral icterus. Extraocular muscles intact.  HEENT: Head atraumatic, normocephalic. Oropharynx and nasopharynx clear.  NECK:  No JVD, supple  LUNGS: Decreased breath sounds bilaterally.  No use of accessory muscles of respiration.  CARDIOVASCULAR: S1, S2 normal. No murmurs, rubs, or gallops.  ABDOMEN: Soft, NTND EXTREMITIES:Trace BLE edema without erythema.  Capillary refill < 3 seconds in all extremities. Pulses palpable distally. NEUROLOGIC: The patient is A&O X4 . No focal neurological deficit appreciated. Cranial nerves are intact.  SKIN: No obvious rash, lesion, or ulcer. Warm to touch Labs/imaging that I havepersonally reviewed  (right click and Reselect all SmartList Selections daily)  DG Chest Port 1 View Result Date: 03/14/2024 CLINICAL DATA:  Shortness of breath. EXAM: PORTABLE CHEST 1 VIEW COMPARISON:  08/09/2022 FINDINGS: The cardio pericardial silhouette is enlarged. There is pulmonary vascular congestion without overt pulmonary edema. Streaky density at the left base suggest atelectasis. No overt pulmonary edema or pleural effusion. Right-sided permanent pacemaker/AICD is new in the interval. Telemetry leads overlie the chest. IMPRESSION: 1. Enlargement of the cardiopericardial silhouette with pulmonary vascular congestion. 2. Streaky density at the left base suggest atelectasis. Electronically Signed   By: Donnal Fusi M.D.   On: 03/14/2024 05:17    Labs   CBC: Recent Labs  Lab 03/14/24 0426  WBC 4.3  HGB 8.8*  HCT 26.9*  MCV 100.4*  PLT 112*   Basic Metabolic Panel: No results for input(s): NA, K, CL, CO2, GLUCOSE, BUN, CREATININE, CALCIUM , MG, PHOS in the last 168 hours. GFR: CrCl cannot be  calculated (Patient's most recent lab result is older than the maximum 21 days allowed.). Recent Labs  Lab 03/14/24 0426  WBC 4.3    Liver Function Tests: No results for input(s): AST, ALT, ALKPHOS, BILITOT, PROT, ALBUMIN in the last 168 hours. No results for input(s): LIPASE, AMYLASE in the last 168 hours. No results for input(s): AMMONIA in the last 168 hours.  ABG    Component Value Date/Time   TCO2 26 09/04/2022 1310     Coagulation Profile: No results for input(s): INR, PROTIME in the last 168 hours.  Cardiac Enzymes: No results for input(s): CKTOTAL, CKMB, CKMBINDEX, TROPONINI in the last 168 hours.  HbA1C: Hgb A1c MFr Bld  Date/Time Value Ref Range Status  08/09/2022 06:30 AM 6.5 (H) 4.8 - 5.6 % Final    Comment:    (NOTE) Pre diabetes:          5.7%-6.4%  Diabetes:              >6.4%  Glycemic control for   <7.0% adults with diabetes   02/20/2021 05:07 AM 6.1 (H) 4.8 - 5.6 % Final    Comment:    (NOTE)         Prediabetes: 5.7 - 6.4         Diabetes: >6.4         Glycemic control for adults with diabetes: <7.0     CBG: No results for input(s): GLUCAP in the last 168 hours.  Review of Systems:   Review of Systems  Constitutional:  Positive for malaise/fatigue.  HENT: Negative.    Eyes: Negative.   Respiratory:  Positive for shortness of breath.   Cardiovascular:  Positive for leg swelling and PND.  Gastrointestinal:  Positive for abdominal pain, diarrhea and nausea.  Genitourinary: Negative.   Musculoskeletal:  Positive for joint pain.  Skin: Negative.   Neurological:  Positive for weakness.  Endo/Heme/Allergies: Negative.   Psychiatric/Behavioral:  Positive for depression. The patient is nervous/anxious.    Past Medical History  He,  has a past medical history of Diabetes mellitus without complication (HCC), ESRD (end stage renal disease) on dialysis (HCC), Gout, Hypertension, LBBB (left bundle branch block), and  OSA on CPAP.   Surgical History    Past Surgical History:  Procedure Laterality Date   AV FISTULA PLACEMENT     DIALYSIS/PERMA CATHETER INSERTION Right 08/11/2022   Procedure: DIALYSIS/PERMA CATHETER INSERTION;  Surgeon: Celso College, MD;  Location: [facility];  Service: Cardiovascular;  Laterality: Right;   INSERTION OF ARTERIOVENOUS (AV) ARTEGRAFT ARM Left 09/04/2022   Procedure: INSERTION OF ARTERIOVENOUS (AV) ARTEGRAFT ARM;  Surgeon: Celso College, MD;  Location: [facility] ORS;  Service: Vascular;  Laterality: Left;   LIGATION OF ARTERIOVENOUS  FISTULA Left 09/04/2022   Procedure: LIGATION OF ARTERIOVENOUS  FISTULA;  Surgeon: Celso College, MD;  Location: [facility] ORS;  Service: Vascular;  Laterality: Left;   miscellaneous     peritoneal dialysis catheter placement and removal   REVISON OF ARTERIOVENOUS FISTULA Left 09/04/2022   Procedure: REVISON OF ARTERIOVENOUS FISTULA;  Surgeon: Celso College, MD;  Location: [facility] ORS;  Service: Vascular;  Laterality: Left;     Social History   reports that he has never smoked. He has never used smokeless tobacco. He reports that he does  not drink alcohol and does not use drugs.   Family History   His family history includes Diabetes in his mother; Kidney disease in his sister; Prostate cancer in his father.   Allergies No Known Allergies   Home Medications  Prior to Admission medications   Medication Sig Start Date End Date Taking? Authorizing Provider  albuterol  (VENTOLIN  HFA) 108 (90 Base) MCG/ACT inhaler Inhale 2 puffs into the lungs every 6 (six) hours as needed for wheezing. 07/19/18   [provider]  amLODipine  (NORVASC ) 5 MG tablet Take 5 mg by mouth at bedtime. 11/05/18   [provider]  aspirin  81 MG EC tablet Take 1 tablet by mouth daily. Patient not taking: Reported on 08/29/2022 08/01/16   [provider]  calcitRIOL  (ROCALTROL ) 0.25 MCG capsule Take 1 capsule by mouth daily. 12/28/15   [provider]  calcium  acetate (PHOSLO ) 667 MG capsule Take 2,001 mg by mouth 3 (three) times daily with meals.     [provider]  carvedilol  (COREG ) 3.125 MG tablet Take 3.125 mg by mouth 2 (two) times daily with a meal.    [provider]  Cholecalciferol (VITAMIN D-3) 125 MCG (5000 UT) TABS Take by mouth.    [provider]  cinacalcet  (SENSIPAR ) 30 MG tablet Take 1 tablet by mouth daily. Patient not taking: Reported on 08/09/2022 04/10/20   [provider]  citalopram  (CELEXA ) 20 MG tablet Take 20 mg by mouth daily.  Patient not taking: Reported on 02/19/2021    [provider]  furosemide  (LASIX ) 40 MG tablet Take 1 tablet by mouth daily. Patient not taking: Reported on 08/29/2022 12/28/15   [provider]  gabapentin  (NEURONTIN ) 300 MG capsule Take 300 mg by mouth daily. 04/08/19   [provider]  insulin  aspart protamine- aspart (NOVOLOG  MIX 70/30) (70-30) 100 UNIT/ML injection Inject 0.05-0.1 mLs (5-10 Units total) into the skin See admin instructions. 5 units every morning and 4 units every evening 02/21/21   Wouk, Haynes Lips, MD  lisinopril  (ZESTRIL ) 20 MG tablet Take 0.5 tablets (10 mg total) by mouth daily. 02/21/21   Wouk, Haynes Lips, MD  loperamide  (IMODIUM ) 2 MG capsule Take 1 capsule (2 mg total) by mouth every 6 (six) hours as needed for diarrhea or loose stools. Patient not taking: Reported on 08/29/2022 07/29/18   Sainani, Vivek J, MD  multivitamin (RENA-VIT) TABS tablet Take 1 tablet by mouth at bedtime. Patient not taking: Reported on 02/19/2021 08/04/19   Swayze, Ava, DO  Nutritional Supplements (FEEDING SUPPLEMENT, NEPRO CARB STEADY,) LIQD Take 237 mLs by mouth 2 (two) times daily between meals. Patient not taking: Reported on 08/29/2022 08/05/19   Swayze, Ava, DO  omeprazole  (PRILOSEC  OTC) 20 MG tablet Take 20 mg by mouth daily.    [provider]  sevelamer  carbonate (RENVELA ) 800 MG tablet Take 800-3,200 mg by  mouth See admin instructions. Take 4 tablets (3200mg ) by mouth three times daily with meals Patient not taking: Reported on 08/09/2022    [provider]  sodium zirconium cyclosilicate  (LOKELMA ) 5 g packet Take 5 g by mouth daily. Patient not taking: Reported on 08/09/2022 02/21/21   Janeane Mealy, MD  traZODone  (DESYREL ) 50 MG tablet Take 1 tablet by mouth at bedtime. Patient not taking: Reported on 08/29/2022 04/13/20 02/26/21  [provider]  Scheduled Meds:  albuterol   2.5 mg Nebulization Once   Chlorhexidine  Gluconate Cloth  6 each Topical Q0600   heparin   5,000 Units Subcutaneous  Q8H   patiromer  8.4 g Oral Daily   Continuous Infusions:  calcium  gluconate 1,000 mg (03/14/24 0524)   PRN Meds:.docusate sodium , polyethylene glycol   Active Hospital Problem list   See systems below  Assessment & Plan:  ESRD on HD MWF Hyperkalemia with EKG changes due to missed HD corrected with sodium bicarb, calcium  gluconate and Insulin  NAGMA Missed two session of HD -Monitor I&O's / urinary output -Follow BMP -Replace/correct electrolytes as indicated -Urgent HD for volume removal -Nephrology consult   #Acute on chronic HFrEF (LVEF 20%) due to NICM #Hypertension #HLD Hx: LBBB, first-degree AV block s/p biventricular ICD, -BNP elevated -HD for volume removal -Continue Norvasc , Coreg , Lisinopril , losartan and Lasix  -Continue aspirin  -Continue atorvastatin -Cardiology consult as appropriate  #Diabetes mellitus #Diabetic neuropathy -CBGs Q4 -Sliding scale insulin  -Follow ICU hyper/hypoglycemia protocol -Continue home gabapentin  -Hold home meds for now   #Anemia of chronic disease #Chronic thrombocytopenia -Follow CBC -Monitor for signs symptoms of bleeding -Transfuse as needed   #Diarrhea ? Medication side effect ~resolved -Send GI panel if persistent -Blood cultures  #Anxiety and Depression - Continue citalopram   Best practice:  Diet:   Oral Pain/Anxiety/Delirium protocol (if indicated): No VAP protocol (if indicated): Not indicated DVT prophylaxis: LMWH GI prophylaxis: PPI Glucose control:  SSI Yes Central venous access:  N/A Arterial line:  N/A Foley:  N/A Mobility:  bed rest  PT consulted: N/A Last date of multidisciplinary goals of care discussion [updated family at the bedside] Code Status:  full code Disposition: ICU   = Goals of Care = Code Status Order: FULL  Primary Emergency Contact: Cory Dingwall, Home Phone: [phone]   Critical care time: 45 minutes        Alonza Arthurs DNP, CCRN, FNP-C, AGACNP-BC Acute Care & Family Nurse Practitioner [facility] PCCM on call pager [phone]

## 2024-03-14 NOTE — Plan of Care (Signed)
  Problem: Coping: Goal: Ability to adjust to condition or change in health will improve Outcome: Progressing   Problem: Fluid Volume: Goal: Ability to maintain a balanced intake and output will improve Outcome: Progressing   Problem: Health Behavior/Discharge Planning: Goal: Ability to identify and utilize available resources and services will improve Outcome: Progressing Goal: Ability to manage health-related needs will improve Outcome: Progressing   Problem: Metabolic: Goal: Ability to maintain appropriate glucose levels will improve Outcome: Progressing   Problem: Nutritional: Goal: Maintenance of adequate nutrition will improve Outcome: Progressing   Problem: Skin Integrity: Goal: Risk for impaired skin integrity will decrease Outcome: Progressing   Problem: Clinical Measurements: Goal: Will remain free from infection Outcome: Progressing Goal: Diagnostic test results will improve Outcome: Progressing Goal: Respiratory complications will improve Outcome: Progressing   Problem: Activity: Goal: Risk for activity intolerance will decrease Outcome: Progressing   Problem: Nutrition: Goal: Adequate nutrition will be maintained Outcome: Progressing   Problem: Coping: Goal: Level of anxiety will decrease Outcome: Progressing   Problem: Elimination: Goal: Will not experience complications related to bowel motility Outcome: Progressing Goal: Will not experience complications related to urinary retention Outcome: Progressing   Problem: Pain Managment: Goal: General experience of comfort will improve and/or be controlled Outcome: Progressing   Problem: Safety: Goal: Ability to remain free from injury will improve Outcome: Progressing   Problem: Skin Integrity: Goal: Risk for impaired skin integrity will decrease Outcome: Progressing

## 2024-03-14 NOTE — ED Notes (Signed)
 Called CCMD to initiate cardiac monitoring.

## 2024-03-14 NOTE — Progress Notes (Signed)
 [facility], [place] 03/14/24  Subjective:   LOS: 0  Patient known to our practice from previous admission last seen in November 2023. Today he presents to the hospital after having missed 2 dialysis treatments last week on Wednesday and Friday.  He has decreased oral intake.  He missed treatments due to loose stools.  Last episode at 3 AM this morning.  Patient's wife and daughter at bedside.  Oral intake has also been poor.  They denied drinking electrolyte drinks, fruit juices at home.  Patient was found to have severely elevated potassium of 7.5 upon arrival.  Urgent hemodialysis was requested. Last HD was 1 week ago  Objective:  Vital signs in last 24 hours:  Temp:  [97.7 F (36.5 C)-98.1 F (36.7 C)] 97.7 F (36.5 C) (06/16 1400) Pulse Rate:  [59-71] 60 (06/16 1407) Resp:  [13-22] 13 (06/16 1407) BP: (128-148)/(63-78) 138/69 (06/16 1407) SpO2:  [97 %-100 %] 100 % (06/16 1407) Weight:  [86.2 kg-97.7 kg] 97 kg (06/16 1400)  Weight change:  Filed Weights   03/14/24 0421 03/14/24 0900 03/14/24 1400  Weight: 86.2 kg 97.7 kg 97 kg    Intake/Output:   No intake or output data in the 24 hours ending 03/14/24 1423   Physical Exam: General: No acute distress, laying in the bed  HEENT Moist oral mucous membranes  Pulm/lungs Normal breathing effort, coarse breath sounds  CVS/Heart Paced rhythm  Abdomen:  Soft, nontender, nondistended  Extremities: No significant edema  Neurologic: Alert and able to follow commands  Skin: No acute rashes  Access: Left upper extremity AV graft       Basic Metabolic Panel:  Recent Labs  Lab 03/14/24 0426 03/14/24 0918  NA 133*  --   K >7.5* 7.3*  CL 91*  --   CO2 24  --   GLUCOSE 116*  --   BUN 105*  --   CREATININE 10.71*  --   CALCIUM  7.4*  --   MG 3.2*  --      CBC: Recent Labs  Lab 03/14/24 0426  WBC 4.3  HGB 8.8*  HCT 26.9*  MCV 100.4*  PLT 112*      Lab Results  Component Value  Date   HEPBSAG NON REACTIVE 03/14/2024   HEPBSAB Non Reactive 09/22/2017      Microbiology:  Recent Results (from the past 240 hours)  Culture, blood (Routine X 2) w Reflex to ID Panel     Status: None (Preliminary result)   Collection Time: 03/14/24  6:07 AM   Specimen: BLOOD  Result Value Ref Range Status   Specimen Description BLOOD BLOOD RIGHT WRIST  Final   Special Requests   Final    BOTTLES DRAWN AEROBIC AND ANAEROBIC Blood Culture results may not be optimal due to an inadequate volume of blood received in culture bottles   Culture   Final    NO GROWTH <12 HOURS Performed at [facility], [address] [place], [place] [address]    Report Status PENDING  Incomplete  Culture, blood (Routine X 2) w Reflex to ID Panel     Status: None (Preliminary result)   Collection Time: 03/14/24  6:08 AM   Specimen: BLOOD  Result Value Ref Range Status   Specimen Description BLOOD BLOOD RIGHT ARM  Final   Special Requests   Final    BOTTLES DRAWN AEROBIC AND ANAEROBIC Blood Culture results may not be optimal due to an inadequate volume of blood  received in culture bottles   Culture   Final    NO GROWTH <12 HOURS Performed at [facility], [address] [place], [place] [address]    Report Status PENDING  Incomplete  MRSA Next Gen by PCR, Nasal     Status: None   Collection Time: 03/14/24  9:02 AM   Specimen: Nasal Mucosa; Nasal Swab  Result Value Ref Range Status   MRSA by PCR Next Gen NOT DETECTED NOT DETECTED Final    Comment: (NOTE) The GeneXpert MRSA Assay (FDA approved for NASAL specimens only), is one component of a comprehensive MRSA colonization surveillance program. It is not intended to diagnose MRSA infection nor to guide or monitor treatment for MRSA infections. Test performance is not FDA approved in patients less than 60 years old. Performed at [facility], [address] [place], [place] [address]     Coagulation Studies: No  results for input(s): LABPROT, INR in the last 72 hours.  Urinalysis: No results for input(s): COLORURINE, LABSPEC, PHURINE, GLUCOSEU, HGBUR, BILIRUBINUR, KETONESUR, PROTEINUR, UROBILINOGEN, NITRITE, LEUKOCYTESUR in the last 72 hours.  Invalid input(s): APPERANCEUR    Imaging: DG Chest Port 1 View Result Date: 03/14/2024 CLINICAL DATA:  Shortness of breath. EXAM: PORTABLE CHEST 1 VIEW COMPARISON:  08/09/2022 FINDINGS: The cardio pericardial silhouette is enlarged. There is pulmonary vascular congestion without overt pulmonary edema. Streaky density at the left base suggest atelectasis. No overt pulmonary edema or pleural effusion. Right-sided permanent pacemaker/AICD is new in the interval. Telemetry leads overlie the chest. IMPRESSION: 1. Enlargement of the cardiopericardial silhouette with pulmonary vascular congestion. 2. Streaky density at the left base suggest atelectasis. Electronically Signed   By: Donnal Fusi M.D.   On: 03/14/2024 05:17     Medications:     Chlorhexidine  Gluconate Cloth  6 each Topical Q0600   feeding supplement (NEPRO CARB STEADY)  237 mL Oral TID BM   heparin   5,000 Units Subcutaneous Q8H   insulin  aspart  0-6 Units Subcutaneous Q4H   multivitamin  1 tablet Per Tube QHS   patiromer  8.4 g Oral Daily   docusate sodium , polyethylene glycol  Assessment/ Plan:  68 y.o. male with end-stage renal disease on hemodialysis, hypertension, diabetes, heart failure with reduced ejection fraction EF 20%, history of AV block status post biventricular ICD placement, anemia, anxiety, diabetic neuropathy, was admitted on 03/14/2024 for  Principal Problem:   ESRD needing dialysis (HCC)  Hyperkalemia [E87.5] Nausea [R11.0] Uremia [N19] SOB (shortness of breath) [R06.02] ESRD needing dialysis (HCC) [N18.6, Z99.2]  #.  Severe hyperkalemia in the setting of ESRD Patient has critical hyperkalemia and is need of urgent hemodialysis. Repeat  potassium is also elevated at 7.3.  Hemodialysis nurse has been notified and urgent hemodialysis arranged. Reassess tomorrow for need of further dialysis. If potassium still remains elevated, will consider vascular evaluation for access dysfunction as hyperkalemia may be due to recirculation.  #. Anemia of CKD  Lab Results  Component Value Date   HGB 8.8 (L) 03/14/2024   Low dose EPO with HD  #. Secondary hyperparathyroidism of renal origin N 25.81      Component Value Date/Time   PTH 359 (H) 08/01/2019 1710   Lab Results  Component Value Date   PHOS 5.3 (H) 02/20/2021   Monitor calcium  and phos level during this admission   #. Diabetes type 2 with CKD Hgb A1c MFr Bld (%)  Date Value  03/14/2024 5.8 (H)     LOS: 0 Danny [name]  Zelda Hickman 6/16/20252:23 PM  [facility] [address] [place], [place] [phone]

## 2024-03-14 NOTE — Progress Notes (Addendum)
 CBG checked for 1220=64. Standing orders for hypoglycemia placed by RN within scope. 12.5 g Dextrose  50% given to patient by RN through IV. Pending recheck at this time.   Recheck 115

## 2024-03-14 NOTE — Progress Notes (Addendum)
 Patient sugar 51 at 1625. Standing orders placed for hypoglycemia.  25g Dextrose  given to patient through PIV. Patient receiving HD at this time also. Will recheck in 15 minutes. Patient Aox4 and vitals WDL at this time and 1625.  Recheck =118

## 2024-03-15 ENCOUNTER — Encounter: Payer: Self-pay | Admitting: Pulmonary Disease

## 2024-03-15 LAB — GLUCOSE, CAPILLARY
Glucose-Capillary: 58 mg/dL — ABNORMAL LOW (ref 70–99)
Glucose-Capillary: 60 mg/dL — ABNORMAL LOW (ref 70–99)
Glucose-Capillary: 79 mg/dL (ref 70–99)
Glucose-Capillary: 112 mg/dL — ABNORMAL HIGH (ref 70–99)
Glucose-Capillary: 145 mg/dL — ABNORMAL HIGH (ref 70–99)

## 2024-03-15 LAB — BASIC METABOLIC PANEL WITH GFR
Anion gap: 14 (ref 5–15)
BUN: 58 mg/dL — ABNORMAL HIGH (ref 8–23)
CO2: 25 mmol/L (ref 22–32)
Calcium: 7.7 mg/dL — ABNORMAL LOW (ref 8.9–10.3)
Chloride: 95 mmol/L — ABNORMAL LOW (ref 98–111)
Creatinine, Ser: 6.77 mg/dL — ABNORMAL HIGH (ref 0.61–1.24)
GFR, Estimated: 8 mL/min — ABNORMAL LOW (ref >60)
Glucose, Bld: 59 mg/dL — ABNORMAL LOW (ref 70–99)
Potassium: 4.7 mmol/L (ref 3.5–5.1)
Sodium: 134 mmol/L — ABNORMAL LOW (ref 135–145)

## 2024-03-15 LAB — CBC
HCT: 24.1 % — ABNORMAL LOW (ref 39.0–52.0)
Hemoglobin: 8.2 g/dL — ABNORMAL LOW (ref 13.0–17.0)
MCH: 33.3 pg (ref 26.0–34.0)
MCHC: 34 g/dL (ref 30.0–36.0)
MCV: 98 fL (ref 80.0–100.0)
Platelets: 106 10*3/uL — ABNORMAL LOW (ref 150–400)
RBC: 2.46 MIL/uL — ABNORMAL LOW (ref 4.22–5.81)
RDW: 14.6 % (ref 11.5–15.5)
WBC: 3.5 10*3/uL — ABNORMAL LOW (ref 4.0–10.5)
nRBC: 0 % (ref 0.0–0.2)

## 2024-03-15 LAB — MAGNESIUM: Magnesium: 2.4 mg/dL (ref 1.7–2.4)

## 2024-03-15 LAB — HEPATITIS B SURFACE ANTIBODY, QUANTITATIVE: Hep B S AB Quant (Post): 15.9 m[IU]/mL

## 2024-03-15 LAB — PHOSPHORUS: Phosphorus: 6 mg/dL — ABNORMAL HIGH (ref 2.5–4.6)

## 2024-03-15 MED ORDER — GLUCOSE 40 % PO GEL
1.0000 | ORAL | Status: AC
Start: 2024-03-15 — End: 2024-03-15
  Administered 2024-03-15: 31 g via ORAL
  Filled 2024-03-15: qty 1

## 2024-03-15 MED ORDER — CHLORHEXIDINE GLUCONATE CLOTH 2 % EX PADS: 6.0000 | MEDICATED_PAD | Freq: Every day | CUTANEOUS | Status: DC

## 2024-03-15 NOTE — H&P (Signed)
 NAME:  Danny [name], MRN:  [id number], DOB:  November 18, 1955, LOS: 1 ADMISSION DATE:  03/14/2024, CONSULTATION DATE: 03/14/2024 REFERRING MD: Tori Freer, CHIEF COMPLAINT: Nausea, dizziness and shortness of breath  HPI  68 y.o male with significant PMH of  ESRD on HD on M/W/F, HTN, T2DM, HFrEF, LVEF 20% LBBB, first-degree AV block s/p biventricular ICD, anemia of chronic disease, CLABSI,, anxiety and depression, diabetic neuropathy nephropathy, AV fistula thrombosis who presented to the ED with chief complaints of nausea, dizziness, and shortness of breath as a result of missed 2 sessions of HD last week.   ED Course: Initial vital signs showed HR of 65 beats/minute, BP 145/72 mm Hg, the RR 18 breaths/minute, and the oxygen saturation 100% on and a temperature of 97.34F (36.6C).  Pertinent Labs/Diagnostics Findings: Na+/ K+: 133/7.5 Glucose: 116 BUN/Cr.:105/10.71 WBC: 4.3 K/L without bands or neutrophil predominance  Hgb/Hct: 8.8/26.9 Plts: 112 PCT: pending Lactic acid: pending troponin: 35 BNP:  2169.9 CXR> see below Medication administered in the ED: Albuterol , calcium  gluconate, glucose, and insulin  for hyperkalemia correction Disposition: PCCM consulted for ICU admission  03/15/24- patient seen by nephrology Dr Zelda Hickman, patient has been cleared for discharge home.  We are working to [employer] him medically for safe discharge.   Past Medical History  ESRD on HD on M/W/F, HTN, T2DM, HFrEF, LVEF 20% LBBB, first-degree AV block s/p biventricular ICD, anemia of chronic disease, CLABSI, anxiety and depression, diabetic neuropathy nephropathy, AV fistula thrombosis   Significant Hospital Events   6/16: Admit to ICU with hyperkalemia in the setting of missed dialysis  Consults:  Nephrology  Procedures:  None  Significant Diagnostic Tests:  6/16: Chest Xray>  Interim History / Subjective:      Micro Data:  6/16: Blood culture x2> 6/16: MRSA PCR>>   Antimicrobials:   none  OBJECTIVE  Blood pressure (!) 142/63, pulse 70, temperature 97.6 F (36.4 C), temperature source Axillary, resp. rate 18, height 5' 10 (1.778 m), weight 98.6 kg, SpO2 98%.        Intake/Output Summary (Last 24 hours) at 03/15/2024 1035 Last data filed at 03/15/2024 1000 Gross per 24 hour  Intake 313.53 ml  Output 2000 ml  Net -1686.47 ml   Filed Weights   03/14/24 1400 03/14/24 1745 03/15/24 0500  Weight: 97 kg 95 kg 98.6 kg   Physical Examination  GENERAL: 68 year-old critically ill patient lying in the bed in no acute distress EYES: PEERLA. No scleral icterus. Extraocular muscles intact.  HEENT: Head atraumatic, normocephalic. Oropharynx and nasopharynx clear.  NECK:  No JVD, supple  LUNGS: Decreased breath sounds bilaterally.  No use of accessory muscles of respiration.  CARDIOVASCULAR: S1, S2 normal. No murmurs, rubs, or gallops.  ABDOMEN: Soft, NTND EXTREMITIES:Trace BLE edema without erythema.  Capillary refill < 3 seconds in all extremities. Pulses palpable distally. NEUROLOGIC: The patient is A&O X4 . No focal neurological deficit appreciated. Cranial nerves are intact.  SKIN: No obvious rash, lesion, or ulcer. Warm to touch Labs/imaging that I havepersonally reviewed  (right click and Reselect all SmartList Selections daily)  DG Chest Port 1 View Result Date: 03/14/2024 CLINICAL DATA:  Shortness of breath. EXAM: PORTABLE CHEST 1 VIEW COMPARISON:  08/09/2022 FINDINGS: The cardio pericardial silhouette is enlarged. There is pulmonary vascular congestion without overt pulmonary edema. Streaky density at the left base suggest atelectasis. No overt pulmonary edema or pleural effusion. Right-sided permanent pacemaker/AICD is new in the interval. Telemetry leads overlie the chest. IMPRESSION: 1. Enlargement  of the cardiopericardial silhouette with pulmonary vascular congestion. 2. Streaky density at the left base suggest atelectasis. Electronically Signed   By: Donnal Fusi M.D.   On: 03/14/2024 05:17    Labs   CBC: Recent Labs  Lab 03/14/24 0426 03/15/24 0410  WBC 4.3 3.5*  HGB 8.8* 8.2*  HCT 26.9* 24.1*  MCV 100.4* 98.0  PLT 112* 106*   Basic Metabolic Panel: Recent Labs  Lab 03/14/24 0426 03/14/24 0918 03/14/24 1835 03/15/24 0410  NA 133*  --  133* 134*  K >7.5* 7.3* 4.2 4.7  CL 91*  --  93* 95*  CO2 24  --  25 25  GLUCOSE 116*  --  82 59*  BUN 105*  --  57* 58*  CREATININE 10.71*  --  5.98* 6.77*  CALCIUM  7.4*  --  7.8* 7.7*  MG 3.2*  --   --  2.4  PHOS  --   --   --  6.0*   GFR: Estimated Creatinine Clearance: 12.5 mL/min (A) (by C-G formula based on SCr of 6.77 mg/dL (H)). Recent Labs  Lab 03/14/24 0426 03/14/24 0608 03/14/24 0919 03/15/24 0410  PROCALCITON 0.23  --   --   --   WBC 4.3  --   --  3.5*  LATICACIDVEN  --  1.1 0.9  --     Liver Function Tests: Recent Labs  Lab 03/14/24 0426  AST 15  ALT 12  ALKPHOS 98  BILITOT 1.2  PROT 7.3  ALBUMIN 3.8   Recent Labs  Lab 03/14/24 0426  LIPASE 27   No results for input(s): AMMONIA in the last 168 hours.  ABG    Component Value Date/Time   TCO2 26 09/04/2022 1310     Coagulation Profile: No results for input(s): INR, PROTIME in the last 168 hours.  Cardiac Enzymes: No results for input(s): CKTOTAL, CKMB, CKMBINDEX, TROPONINI in the last 168 hours.  HbA1C: Hgb A1c MFr Bld  Date/Time Value Ref Range Status  03/14/2024 09:19 AM 5.8 (H) 4.8 - 5.6 % Final    Comment:    (NOTE) Diagnosis of Diabetes The following HbA1c ranges recommended by the American Diabetes Association (ADA) may be used as an aid in the diagnosis of diabetes mellitus.  Hemoglobin             Suggested A1C NGSP%              Diagnosis  <5.7                   Non Diabetic  5.7-6.4                Pre-Diabetic  >6.4                   Diabetic  <7.0                   Glycemic control for                       adults with diabetes.    08/09/2022 06:30 AM  6.5 (H) 4.8 - 5.6 % Final    Comment:    (NOTE) Pre diabetes:          5.7%-6.4%  Diabetes:              >6.4%  Glycemic control for   <7.0% adults with diabetes     CBG: Recent Labs  Lab 03/14/24 2326 03/15/24  5621 03/15/24 0436 03/15/24 0507 03/15/24 0731  GLUCAP 89 60* 58* 79 112*    Review of Systems:    10 point ROS done and is negative except as per HPI  Past Medical History  He,  has a past medical history of Diabetes mellitus without complication (HCC), ESRD (end stage renal disease) on dialysis (HCC), Gout, Hypertension, LBBB (left bundle branch block), and OSA on CPAP.   Surgical History    Past Surgical History:  Procedure Laterality Date   AV FISTULA PLACEMENT     DIALYSIS/PERMA CATHETER INSERTION Right 08/11/2022   Procedure: DIALYSIS/PERMA CATHETER INSERTION;  Surgeon: Celso College, MD;  Location: [facility] INVASIVE CV LAB;  Service: Cardiovascular;  Laterality: Right;   INSERTION OF ARTERIOVENOUS (AV) ARTEGRAFT ARM Left 09/04/2022   Procedure: INSERTION OF ARTERIOVENOUS (AV) ARTEGRAFT ARM;  Surgeon: Celso College, MD;  Location: [facility] ORS;  Service: Vascular;  Laterality: Left;   LIGATION OF ARTERIOVENOUS  FISTULA Left 09/04/2022   Procedure: LIGATION OF ARTERIOVENOUS  FISTULA;  Surgeon: Celso College, MD;  Location: [facility] ORS;  Service: Vascular;  Laterality: Left;   miscellaneous     peritoneal dialysis catheter placement and removal   REVISON OF ARTERIOVENOUS FISTULA Left 09/04/2022   Procedure: REVISON OF ARTERIOVENOUS FISTULA;  Surgeon: Celso College, MD;  Location: [facility] ORS;  Service: Vascular;  Laterality: Left;     Social History   reports that he has never smoked. He has never used smokeless tobacco. He reports that he does not drink alcohol and does not use drugs.   Family History   His family history includes Diabetes in his mother; Kidney disease in his sister; Prostate cancer in his father.   Allergies No Known Allergies   Home Medications   Prior to Admission medications   Medication Sig Start Date End Date Taking? Authorizing Provider  albuterol  (VENTOLIN  HFA) 108 (90 Base) MCG/ACT inhaler Inhale 2 puffs into the lungs every 6 (six) hours as needed for wheezing. 07/19/18   [provider]  amLODipine  (NORVASC ) 5 MG tablet Take 5 mg by mouth at bedtime. 11/05/18   [provider]  aspirin  81 MG EC tablet Take 1 tablet by mouth daily. Patient not taking: Reported on 08/29/2022 08/01/16   [provider]  calcitRIOL  (ROCALTROL ) 0.25 MCG capsule Take 1 capsule by mouth daily. 12/28/15   [provider]  calcium  acetate (PHOSLO ) 667 MG capsule Take 2,001 mg by mouth 3 (three) times daily with meals.     [provider]  carvedilol  (COREG ) 3.125 MG tablet Take 3.125 mg by mouth 2 (two) times daily with a meal.    [provider]  Cholecalciferol (VITAMIN D-3) 125 MCG (5000 UT) TABS Take by mouth.    [provider]  cinacalcet  (SENSIPAR ) 30 MG tablet Take 1 tablet by mouth daily. Patient not taking: Reported on 08/09/2022 04/10/20   [provider]  citalopram  (CELEXA ) 20 MG tablet Take 20 mg by mouth daily.  Patient not taking: Reported on 02/19/2021    [provider]  furosemide  (LASIX ) 40 MG tablet Take 1 tablet by mouth daily. Patient not taking: Reported on 08/29/2022 12/28/15   [provider]  gabapentin  (NEURONTIN ) 300 MG capsule Take 300 mg by mouth daily. 04/08/19   [provider]  insulin  aspart protamine- aspart (NOVOLOG  MIX 70/30) (70-30) 100 UNIT/ML injection Inject 0.05-0.1 mLs (5-10 Units total) into the skin See admin instructions. 5 units every morning and 4 units every evening  02/21/21   Wouk, Haynes Lips, MD  lisinopril  (ZESTRIL ) 20 MG tablet Take 0.5 tablets (10 mg total) by mouth daily. 02/21/21   Wouk, Haynes Lips, MD  loperamide  (IMODIUM ) 2 MG capsule Take 1 capsule (2 mg total) by mouth every 6 (six) hours as needed for  diarrhea or loose stools. Patient not taking: Reported on 08/29/2022 07/29/18   Sainani, Vivek J, MD  multivitamin (RENA-VIT) TABS tablet Take 1 tablet by mouth at bedtime. Patient not taking: Reported on 02/19/2021 08/04/19   Swayze, Ava, DO  Nutritional Supplements (FEEDING SUPPLEMENT, NEPRO CARB STEADY,) LIQD Take 237 mLs by mouth 2 (two) times daily between meals. Patient not taking: Reported on 08/29/2022 08/05/19   Swayze, Ava, DO  omeprazole  (PRILOSEC  OTC) 20 MG tablet Take 20 mg by mouth daily.    [provider]  sevelamer  carbonate (RENVELA ) 800 MG tablet Take 800-3,200 mg by mouth See admin instructions. Take 4 tablets (3200mg ) by mouth three times daily with meals Patient not taking: Reported on 08/09/2022    [provider]  sodium zirconium cyclosilicate  (LOKELMA ) 5 g packet Take 5 g by mouth daily. Patient not taking: Reported on 08/09/2022 02/21/21   Janeane Mealy, MD  traZODone  (DESYREL ) 50 MG tablet Take 1 tablet by mouth at bedtime. Patient not taking: Reported on 08/29/2022 04/13/20 02/26/21  [provider]  Scheduled Meds:  Chlorhexidine  Gluconate Cloth  6 each Topical Q0600   epoetin  alfa-epbx (RETACRIT ) injection  4,000 Units Intravenous Q M,W,F-1800   feeding supplement (NEPRO CARB STEADY)  237 mL Oral TID BM   heparin   5,000 Units Subcutaneous Q8H   insulin  aspart  0-6 Units Subcutaneous Q4H   multivitamin  1 tablet Oral QHS   patiromer  8.4 g Oral Daily   Continuous Infusions:   PRN Meds:.docusate sodium , polyethylene glycol   Active Hospital Problem list   See systems below  Assessment & Plan:  ESRD on HD MWF Hyperkalemia with EKG changes due to missed HD corrected with sodium bicarb, calcium  gluconate and Insulin  NAGMA Missed two session of HD -Monitor I&O's / urinary output -Follow BMP -Replace/correct electrolytes as indicated -Urgent HD for volume removal -Nephrology consult   #Acute on chronic HFrEF (LVEF 20%) due to  NICM #Hypertension #HLD Hx: LBBB, first-degree AV block s/p biventricular ICD, -BNP elevated -HD for volume removal -Continue Norvasc , Coreg , Lisinopril , losartan and Lasix  -Continue aspirin  -Continue atorvastatin -Cardiology consult as appropriate  #Diabetes mellitus #Diabetic neuropathy -CBGs Q4 -Sliding scale insulin  -Follow ICU hyper/hypoglycemia protocol -Continue home gabapentin  -Hold home meds for now   #Anemia of chronic disease #Chronic thrombocytopenia -Follow CBC -Monitor for signs symptoms of bleeding -Transfuse as needed   #Diarrhea ? Medication side effect ~resolved -Send GI panel if persistent -Blood cultures  #Anxiety and Depression - Continue citalopram   Best practice:  Diet:  Oral Pain/Anxiety/Delirium protocol (if indicated): No VAP protocol (if indicated): Not indicated DVT prophylaxis: LMWH GI prophylaxis: PPI Glucose control:  SSI Yes Central venous access:  N/A Arterial line:  N/A Foley:  N/A Mobility:  bed rest  PT consulted: N/A Last date of multidisciplinary goals of care discussion [updated family at the bedside] Code Status:  full code Disposition: ICU   = Goals of Care = Code Status Order: FULL  Primary Emergency Contact: Cory Dingwall, Home Phone: [phone]   Critical care provider statement:   Total critical care time: 33 minutes   Performed by: Jaclynn Mast MD   Critical care time was exclusive of separately billable procedures  and treating other patients.   Critical care was necessary to treat or prevent imminent or life-threatening deterioration.   Critical care was time spent personally by me on the following activities: development of treatment plan with patient and/or surrogate as well as nursing, discussions with consultants, evaluation of patient's response to treatment, examination of patient, obtaining history from patient or surrogate, ordering and performing treatments and interventions, ordering and review of  laboratory studies, ordering and review of radiographic studies, pulse oximetry and re-evaluation of patient's condition.    Esai Stecklein, M.D.  Pulmonary & Critical Care Medicine

## 2024-03-15 NOTE — Progress Notes (Signed)
 [facility]- [place], [place] 03/15/24  Subjective:   LOS: 1  Patient known to our practice from previous admission last seen in November 2023. He presented to the hospital after having missed 2 dialysis treatments last week on Wednesday and Friday.  He had decreased oral intake.  He missed treatments due to loose stools.  Last episode at 3 AM Prior to admission.  They denied drinking electrolyte drinks, fruit juices at home.  Upon admission patient was found to have critically elevated potassium of greater than 7.5.  After shifting measures, potassium was still elevated at 7.3.  Patient underwent urgent hemodialysis yesterday.  This morning potassium is now at 4.7.  Overall he feels better.  He has not had any more loose stools since hospitalization.  He states he is not eating much because he does not like the hospital food.  Conversation was via Sales promotion account executive.  Objective:  Vital signs in last 24 hours:  Temp:  [97.5 F (36.4 C)-98.1 F (36.7 C)] 97.6 F (36.4 C) (06/17 0800) Pulse Rate:  [57-72] 59 (06/17 1100) Resp:  [10-23] 10 (06/17 1100) BP: (123-168)/(60-94) 123/61 (06/17 1100) SpO2:  [94 %-100 %] 99 % (06/17 1100) Weight:  [95 kg-98.6 kg] 98.6 kg (06/17 0500)  Weight change: 11.5 kg Filed Weights   03/14/24 1400 03/14/24 1745 03/15/24 0500  Weight: 97 kg 95 kg 98.6 kg    Intake/Output:    Intake/Output Summary (Last 24 hours) at 03/15/2024 1204 Last data filed at 03/15/2024 1000 Gross per 24 hour  Intake 313.53 ml  Output 2000 ml  Net -1686.47 ml     Physical Exam: General: No acute distress, sitting up in the bed  HEENT Moist oral mucous membranes  Pulm/lungs Normal breathing effort, coarse breath sounds  CVS/Heart Paced rhythm, systolic murmur present  Abdomen:  Soft, nontender, nondistended  Extremities: No significant edema  Neurologic: Alert and able to follow commands  Skin: No acute rashes  Access: Left upper extremity AV  graft       Basic Metabolic Panel:  Recent Labs  Lab 03/14/24 0426 03/14/24 0918 03/14/24 1835 03/15/24 0410  NA 133*  --  133* 134*  K >7.5* 7.3* 4.2 4.7  CL 91*  --  93* 95*  CO2 24  --  25 25  GLUCOSE 116*  --  82 59*  BUN 105*  --  57* 58*  CREATININE 10.71*  --  5.98* 6.77*  CALCIUM  7.4*  --  7.8* 7.7*  MG 3.2*  --   --  2.4  PHOS  --   --   --  6.0*     CBC: Recent Labs  Lab 03/14/24 0426 03/15/24 0410  WBC 4.3 3.5*  HGB 8.8* 8.2*  HCT 26.9* 24.1*  MCV 100.4* 98.0  PLT 112* 106*      Lab Results  Component Value Date   HEPBSAG NON REACTIVE 03/14/2024   HEPBSAB Non Reactive 09/22/2017      Microbiology:  Recent Results (from the past 240 hours)  Culture, blood (Routine X 2) w Reflex to ID Panel     Status: None (Preliminary result)   Collection Time: 03/14/24  6:07 AM   Specimen: BLOOD  Result Value Ref Range Status   Specimen Description BLOOD BLOOD RIGHT WRIST  Final   Special Requests   Final    BOTTLES DRAWN AEROBIC AND ANAEROBIC Blood Culture results may not be optimal due to an inadequate volume of blood received in culture bottles  Culture   Final    NO GROWTH < 24 HOURS Performed at [facility], [address] [place], [place] [address]    Report Status PENDING  Incomplete  Culture, blood (Routine X 2) w Reflex to ID Panel     Status: None (Preliminary result)   Collection Time: 03/14/24  6:08 AM   Specimen: BLOOD  Result Value Ref Range Status   Specimen Description BLOOD BLOOD RIGHT ARM  Final   Special Requests   Final    BOTTLES DRAWN AEROBIC AND ANAEROBIC Blood Culture results may not be optimal due to an inadequate volume of blood received in culture bottles   Culture   Final    NO GROWTH < 24 HOURS Performed at [facility], [address] [place], [place] [address]    Report Status PENDING  Incomplete  MRSA Next Gen by PCR, Nasal     Status: None   Collection Time: 03/14/24  9:02 AM   Specimen:  Nasal Mucosa; Nasal Swab  Result Value Ref Range Status   MRSA by PCR Next Gen NOT DETECTED NOT DETECTED Final    Comment: (NOTE) The GeneXpert MRSA Assay (FDA approved for NASAL specimens only), is one component of a comprehensive MRSA colonization surveillance program. It is not intended to diagnose MRSA infection nor to guide or monitor treatment for MRSA infections. Test performance is not FDA approved in patients less than 51 years old. Performed at [facility], [address] [place], [place] [address]     Coagulation Studies: No results for input(s): LABPROT, INR in the last 72 hours.  Urinalysis: No results for input(s): COLORURINE, LABSPEC, PHURINE, GLUCOSEU, HGBUR, BILIRUBINUR, KETONESUR, PROTEINUR, UROBILINOGEN, NITRITE, LEUKOCYTESUR in the last 72 hours.  Invalid input(s): APPERANCEUR    Imaging: DG Chest Port 1 View Result Date: 03/14/2024 CLINICAL DATA:  Shortness of breath. EXAM: PORTABLE CHEST 1 VIEW COMPARISON:  08/09/2022 FINDINGS: The cardio pericardial silhouette is enlarged. There is pulmonary vascular congestion without overt pulmonary edema. Streaky density at the left base suggest atelectasis. No overt pulmonary edema or pleural effusion. Right-sided permanent pacemaker/AICD is new in the interval. Telemetry leads overlie the chest. IMPRESSION: 1. Enlargement of the cardiopericardial silhouette with pulmonary vascular congestion. 2. Streaky density at the left base suggest atelectasis. Electronically Signed   By: Donnal Fusi M.D.   On: 03/14/2024 05:17     Medications:     Chlorhexidine  Gluconate Cloth  6 each Topical Q0600   epoetin  alfa-epbx (RETACRIT ) injection  4,000 Units Intravenous Q M,W,F-1800   feeding supplement (NEPRO CARB STEADY)  237 mL Oral TID BM   heparin   5,000 Units Subcutaneous Q8H   insulin  aspart  0-6 Units Subcutaneous Q4H   multivitamin  1 tablet Oral QHS   patiromer  8.4 g Oral Daily    docusate sodium , polyethylene glycol  Assessment/ Plan:  68 y.o. male with end-stage renal disease on hemodialysis, hypertension, diabetes, heart failure with reduced ejection fraction EF 20%, history of AV block status post biventricular ICD placement, anemia, anxiety, diabetic neuropathy, was admitted on 03/14/2024 for  Principal Problem:   ESRD needing dialysis (HCC)  Hyperkalemia [E87.5] Nausea [R11.0] Uremia [N19] SOB (shortness of breath) [R06.02] ESRD needing dialysis (HCC) [N18.6, Z99.2]  #.  Severe hyperkalemia in the setting of ESRD Potassium corrected after urgent hemodialysis.  This morning's level is 4.7. Patient is cleared to discharge from renal standpoint.  If he still admitted, plan for dialysis on Wednesday.  #. Anemia of  CKD  Lab Results  Component Value Date   HGB 8.2 (L) 03/15/2024   Low dose EPO with HD  #. Secondary hyperparathyroidism of renal origin N 25.81      Component Value Date/Time   PTH 359 (H) 08/01/2019 1710   Lab Results  Component Value Date   PHOS 6.0 (H) 03/15/2024   Monitor calcium  and phos level during this admission Patient reports diarrhea at home.  Differential includes viral illness versus loose stools from Cinacalcet .  Recommended to hold Cinacalcet  for a couple weeks.  Patient will discuss this with outpatient nephrologist.   #.  Chronic systolic CHF 2D echo from November 2024-LVEF 20 to 25%, moderate mitral regurgitation, moderate to severely dilated left atrium    LOS: 1 Danny [name] 6/17/202512:04 PM  [facility] [place], [place] [phone]

## 2024-03-15 NOTE — Progress Notes (Signed)
 PHARMACY CONSULT NOTE - ELECTROLYTES  Pharmacy Consult for Electrolyte Monitoring and Replacement   Recent Labs: Height: 5' 10 (177.8 cm) Weight: 98.6 kg (217 lb 6 oz) IBW/kg (Calculated) : 73 Estimated Creatinine Clearance: 12.5 mL/min (A) (by C-G formula based on SCr of 6.77 mg/dL (H)). Potassium (mmol/L)  Date Value  03/15/2024 4.7  09/10/2013 3.4 (L)   Magnesium  (mg/dL)  Date Value  16/06/9603 2.4   Calcium  (mg/dL)  Date Value  54/05/8118 7.7 (L)   Calcium , Total (mg/dL)  Date Value  14/78/2956 8.5   Albumin (g/dL)  Date Value  21/30/8657 3.8  09/10/2013 2.6 (L)   Phosphorus (mg/dL)  Date Value  84/69/6295 6.0 (H)   Sodium (mmol/L)  Date Value  03/15/2024 134 (L)  09/10/2013 128 (L)   Assessment  Danny [name] is a 67 y.o. male presenting with nausea, dizziness, and shortness of breath as a result of missed 2 sessions of HD last week. Aaron Aas PMH significant for ESRD on HD on M/W/F, HTN, T2DM, HFrEF, LVEF 20% LBBB, first-degree AV block s/p biventricular ICD, anemia of chronic disease, CLABSI,, anxiety and depression, diabetic neuropathy nephropathy, AV fistula thrombosis . Pharmacy has been consulted to monitor and replace electrolytes.  Diet: Renal MIVF: N/A Pertinent medications:  Veltassa 8.4 gm daily  Goal of Therapy: Electrolytes WNL  Plan:  Hyperkalemia resolved w/ HD. Defer to nephrology on decision to continue Veltassa with normalized K+ Will sign off on consult at this time given resolution hyperkalemia  Thank you for allowing pharmacy to be a part of this patient's care.  Page Boast 03/15/2024 7:43 AM

## 2024-03-15 NOTE — Discharge Summary (Signed)
 Physician Discharge Summary         Patient ID: Danny [name] MRN: [id number] DOB/AGE: 1956/07/19 68 y.o.  Admit date: 03/14/2024 Discharge date: 03/15/2024  Discharge Diagnoses:    Active Hospital Problems   Diagnosis Date Noted   ESRD needing dialysis [facility]) 03/14/2024    Resolved Hospital Problems  No resolved problems to display.      Discharge summary    68 y.o male with significant PMH of  ESRD on HD on M/W/F, HTN, T2DM, HFrEF, LVEF 20% LBBB, first-degree AV block s/p biventricular ICD, anemia of chronic disease, CLABSI,, anxiety and depression, diabetic neuropathy nephropathy, AV fistula thrombosis who presented to the ED with chief complaints of nausea, dizziness, and shortness of breath as a result of missed 2 sessions of HD last week.    Discharge Plan by Active Problems    Patient seen by nephrology and had dialysis performed and had electrolytes corrected. He feels well and is at baseline with family at bedside.    Significant Hospital tests/ studies  Initial labs +/ K+: 133/7.5 Glucose: 116 BUN/Cr.:105/10.71 WBC: 4.3 K/L without bands or neutrophil predominance  Hgb/Hct: 8.8/26.9 Plts: 112 PCT: pending Lactic acid: pending troponin: 35 BNP:  2169.9 CXR> see below Procedures   hemodialysis Culture data/antimicrobials     6/16: Blood culture x2> 6/16: MRSA PCR> Consults    Nephrology   Discharge Exam: BP 119/69   Pulse 61   Temp 98.1 F (36.7 C) (Oral)   Resp 14   Ht 5' 10 (1.778 m)   Wt 98.6 kg   SpO2 96%   BMI 31.19 kg/m    Labs at discharge   5 Low  93 Low   91 Low  96 Low  97 Low  97 Low    CO2 25 25  24 23  19  Low   Glucose, Bld 59 Low  82 CM  116 High  CM 87 CM 109 High  CM 142 High  CM  Comment: Glucose reference range applies only to samples taken after fasting for at least 8 hours.  BUN 58 High  57 High   105 High  CM 43 High  30 High  111 High  CM  Creatinine, Ser 6.77 High  5.98 High   10.71 High  6.79 High   6.90 High  14.85 High   Calcium  7.7 Low  7.8 Low   7.4 Low  8.0 Low   8.9  GFR, Estimated 8 Low          Lab Results  Component Value Date   CREATININE 6.77 (H) 03/15/2024   BUN 58 (H) 03/15/2024   NA 134 (L) 03/15/2024   K 4.7 03/15/2024   CL 95 (L) 03/15/2024   CO2 25 03/15/2024   Lab Results  Component Value Date   WBC 3.5 (L) 03/15/2024   HGB 8.2 (L) 03/15/2024   HCT 24.1 (L) 03/15/2024   MCV 98.0 03/15/2024   PLT 106 (L) 03/15/2024   Lab Results  Component Value Date   ALT 12 03/14/2024   AST 15 03/14/2024   ALKPHOS 98 03/14/2024   BILITOT 1.2 03/14/2024   Lab Results  Component Value Date   INR 1.2 02/20/2021   INR 1.2 02/19/2021   INR 1.2 08/01/2019    Current radiological studies    DG Chest Port 1 View Result Date: 03/14/2024 CLINICAL DATA:  Shortness of breath. EXAM: PORTABLE CHEST 1 VIEW COMPARISON:  08/09/2022 FINDINGS: The cardio pericardial silhouette is  enlarged. There is pulmonary vascular congestion without overt pulmonary edema. Streaky density at the left base suggest atelectasis. No overt pulmonary edema or pleural effusion. Right-sided permanent pacemaker/AICD is new in the interval. Telemetry leads overlie the chest. IMPRESSION: 1. Enlargement of the cardiopericardial silhouette with pulmonary vascular congestion. 2. Streaky density at the left base suggest atelectasis. Electronically Signed   By: Donnal Fusi M.D.   On: 03/14/2024 05:17    Disposition:     Home with wife      Follow-up appointment   Dr Zelda Hickman nephrology Discharge Condition:    stable  Physician Statement:   The Patient was personally examined, the discharge assessment and plan has been completed and reviewed with family  Signed: Melony Tenpas 03/15/2024, 2:41 PM

## 2024-03-19 LAB — CULTURE, BLOOD (ROUTINE X 2)
Culture: NO GROWTH
Culture: NO GROWTH

## 2024-03-24 ENCOUNTER — Ambulatory Visit: Admit: 2024-03-24 | Discharge: 2024-03-25

## 2024-03-24 DIAGNOSIS — N186 End stage renal disease: Principal | ICD-10-CM

## 2024-03-24 DIAGNOSIS — Z992 Dependence on renal dialysis: Principal | ICD-10-CM

## 2024-03-24 MED ORDER — SEVELAMER CARBONATE 800 MG TABLET
ORAL_TABLET | Freq: Three times a day (TID) | ORAL | 3 refills | 90.00000 days | Status: CP
Start: 2024-03-24 — End: 2025-03-24
  Filled 2024-03-29: qty 360, 30d supply, fill #0

## 2024-03-28 DIAGNOSIS — E1121 Type 2 diabetes mellitus with diabetic nephropathy: Principal | ICD-10-CM

## 2024-03-28 DIAGNOSIS — Z794 Long term (current) use of insulin: Principal | ICD-10-CM

## 2024-03-28 MED ORDER — PEN NEEDLE, DIABETIC 31 GAUGE X 5/16" (8 MM)
4 refills | 0.00000 days | Status: CN
Start: 2024-03-28 — End: ?

## 2024-03-28 MED ORDER — BLOOD GLUCOSE TEST STRIPS
ORAL_STRIP | 3 refills | 0.00000 days
Start: 2024-03-28 — End: ?

## 2024-04-09 MED ORDER — CALCIUM ACETATE(PHOSPHATE BINDERS) 667 MG CAPSULE
ORAL_CAPSULE | Freq: Three times a day (TID) | ORAL | 11 refills | 30.00000 days | Status: CP
Start: 2024-04-09 — End: 2025-04-09

## 2024-04-09 MED ORDER — CINACALCET 90 MG TABLET
ORAL_TABLET | Freq: Every day | ORAL | 11 refills | 30.00000 days | Status: CP
Start: 2024-04-09 — End: 2025-04-09
  Filled 2024-04-18: qty 30, 30d supply, fill #0

## 2024-05-10 ENCOUNTER — Ambulatory Visit: Admit: 2024-05-10 | Discharge: 2024-05-11

## 2024-05-10 DIAGNOSIS — Z45018 Encounter for adjustment and management of other part of cardiac pacemaker: Principal | ICD-10-CM

## 2024-05-26 ENCOUNTER — Observation Stay
Admission: EM | Admit: 2024-05-26 | Discharge: 2024-05-28 | Disposition: A | Discharge status: Home / Self Care | Payer: Self-pay | Attending: Internal Medicine | Admitting: Internal Medicine

## 2024-05-26 ENCOUNTER — Emergency Department: Payer: Self-pay

## 2024-05-26 ENCOUNTER — Other Ambulatory Visit: Payer: Self-pay

## 2024-05-26 DIAGNOSIS — D631 Anemia in chronic kidney disease: Secondary | ICD-10-CM | POA: Insufficient documentation

## 2024-05-26 DIAGNOSIS — R079 Chest pain, unspecified: Secondary | ICD-10-CM

## 2024-05-26 DIAGNOSIS — Z7982 Long term (current) use of aspirin: Secondary | ICD-10-CM | POA: Insufficient documentation

## 2024-05-26 DIAGNOSIS — I5043 Acute on chronic combined systolic (congestive) and diastolic (congestive) heart failure: Secondary | ICD-10-CM | POA: Diagnosis present

## 2024-05-26 DIAGNOSIS — E871 Hypo-osmolality and hyponatremia: Secondary | ICD-10-CM | POA: Insufficient documentation

## 2024-05-26 DIAGNOSIS — D649 Anemia, unspecified: Secondary | ICD-10-CM | POA: Diagnosis present

## 2024-05-26 DIAGNOSIS — K7460 Unspecified cirrhosis of liver: Secondary | ICD-10-CM | POA: Insufficient documentation

## 2024-05-26 DIAGNOSIS — J810 Acute pulmonary edema: Principal | ICD-10-CM | POA: Insufficient documentation

## 2024-05-26 DIAGNOSIS — Z794 Long term (current) use of insulin: Secondary | ICD-10-CM | POA: Insufficient documentation

## 2024-05-26 DIAGNOSIS — I132 Hypertensive heart and chronic kidney disease with heart failure and with stage 5 chronic kidney disease, or end stage renal disease: Secondary | ICD-10-CM | POA: Insufficient documentation

## 2024-05-26 DIAGNOSIS — D696 Thrombocytopenia, unspecified: Secondary | ICD-10-CM | POA: Diagnosis present

## 2024-05-26 DIAGNOSIS — Z7901 Long term (current) use of anticoagulants: Secondary | ICD-10-CM | POA: Insufficient documentation

## 2024-05-26 DIAGNOSIS — I10 Essential (primary) hypertension: Secondary | ICD-10-CM | POA: Diagnosis present

## 2024-05-26 DIAGNOSIS — E785 Hyperlipidemia, unspecified: Secondary | ICD-10-CM | POA: Insufficient documentation

## 2024-05-26 DIAGNOSIS — R0601 Orthopnea: Secondary | ICD-10-CM

## 2024-05-26 DIAGNOSIS — I214 Non-ST elevation (NSTEMI) myocardial infarction: Secondary | ICD-10-CM

## 2024-05-26 DIAGNOSIS — I2489 Other forms of acute ischemic heart disease: Secondary | ICD-10-CM | POA: Diagnosis present

## 2024-05-26 DIAGNOSIS — Z79899 Other long term (current) drug therapy: Secondary | ICD-10-CM | POA: Insufficient documentation

## 2024-05-26 DIAGNOSIS — Z992 Dependence on renal dialysis: Secondary | ICD-10-CM | POA: Insufficient documentation

## 2024-05-26 DIAGNOSIS — N186 End stage renal disease: Secondary | ICD-10-CM | POA: Insufficient documentation

## 2024-05-26 DIAGNOSIS — E1122 Type 2 diabetes mellitus with diabetic chronic kidney disease: Secondary | ICD-10-CM | POA: Diagnosis present

## 2024-05-26 LAB — BASIC METABOLIC PANEL WITH GFR
Anion gap: 17 — ABNORMAL HIGH (ref 5–15)
BUN: 36 mg/dL — ABNORMAL HIGH (ref 8–23)
CO2: 24 mmol/L (ref 22–32)
Calcium: 9.5 mg/dL (ref 8.9–10.3)
Chloride: 93 mmol/L — ABNORMAL LOW (ref 98–111)
Creatinine, Ser: 4.92 mg/dL — ABNORMAL HIGH (ref 0.61–1.24)
GFR, Estimated: 12 mL/min — ABNORMAL LOW (ref >60)
Glucose, Bld: 128 mg/dL — ABNORMAL HIGH (ref 70–99)
Potassium: 4.4 mmol/L (ref 3.5–5.1)
Sodium: 134 mmol/L — ABNORMAL LOW (ref 135–145)

## 2024-05-26 LAB — TROPONIN I (HIGH SENSITIVITY): Troponin I (High Sensitivity): 157 ng/L (ref <18)

## 2024-05-26 NOTE — ED Triage Notes (Signed)
 Pt reports chest pain and sob that began a few days ago, pt reports cough and congestion also. Pt has defibrillator placed. And is a dialysis pt, mwf, had full treatment Wednesday.

## 2024-05-27 ENCOUNTER — Emergency Department: Payer: Self-pay

## 2024-05-27 ENCOUNTER — Encounter: Payer: Self-pay | Admitting: Family Medicine

## 2024-05-27 DIAGNOSIS — D649 Anemia, unspecified: Secondary | ICD-10-CM

## 2024-05-27 DIAGNOSIS — N186 End stage renal disease: Secondary | ICD-10-CM

## 2024-05-27 DIAGNOSIS — I10 Essential (primary) hypertension: Secondary | ICD-10-CM

## 2024-05-27 DIAGNOSIS — E1122 Type 2 diabetes mellitus with diabetic chronic kidney disease: Secondary | ICD-10-CM

## 2024-05-27 DIAGNOSIS — I2489 Other forms of acute ischemic heart disease: Secondary | ICD-10-CM | POA: Diagnosis present

## 2024-05-27 DIAGNOSIS — D696 Thrombocytopenia, unspecified: Secondary | ICD-10-CM

## 2024-05-27 DIAGNOSIS — I214 Non-ST elevation (NSTEMI) myocardial infarction: Secondary | ICD-10-CM

## 2024-05-27 DIAGNOSIS — Z992 Dependence on renal dialysis: Secondary | ICD-10-CM

## 2024-05-27 DIAGNOSIS — E871 Hypo-osmolality and hyponatremia: Secondary | ICD-10-CM

## 2024-05-27 DIAGNOSIS — I5042 Chronic combined systolic (congestive) and diastolic (congestive) heart failure: Secondary | ICD-10-CM

## 2024-05-27 LAB — BASIC METABOLIC PANEL WITH GFR
Anion gap: 12 (ref 5–15)
BUN: 40 mg/dL — ABNORMAL HIGH (ref 8–23)
CO2: 29 mmol/L (ref 22–32)
Calcium: 9.5 mg/dL (ref 8.9–10.3)
Chloride: 95 mmol/L — ABNORMAL LOW (ref 98–111)
Creatinine, Ser: 5.27 mg/dL — ABNORMAL HIGH (ref 0.61–1.24)
GFR, Estimated: 11 mL/min — ABNORMAL LOW (ref >60)
Glucose, Bld: 90 mg/dL (ref 70–99)
Potassium: 4.4 mmol/L (ref 3.5–5.1)
Sodium: 136 mmol/L (ref 135–145)

## 2024-05-27 LAB — CBC
HCT: 32.9 % — ABNORMAL LOW (ref 39.0–52.0)
HCT: 30.5 % — ABNORMAL LOW (ref 39.0–52.0)
Hemoglobin: 10.7 g/dL — ABNORMAL LOW (ref 13.0–17.0)
Hemoglobin: 10 g/dL — ABNORMAL LOW (ref 13.0–17.0)
MCH: 31.9 pg (ref 26.0–34.0)
MCH: 31.5 pg (ref 26.0–34.0)
MCHC: 32.5 g/dL (ref 30.0–36.0)
MCHC: 32.8 g/dL (ref 30.0–36.0)
MCV: 98.2 fL (ref 80.0–100.0)
MCV: 96.2 fL (ref 80.0–100.0)
Platelets: 86 K/uL — ABNORMAL LOW (ref 150–400)
Platelets: 83 K/uL — ABNORMAL LOW (ref 150–400)
RBC: 3.35 MIL/uL — ABNORMAL LOW (ref 4.22–5.81)
RBC: 3.17 MIL/uL — ABNORMAL LOW (ref 4.22–5.81)
RDW: 14.2 % (ref 11.5–15.5)
RDW: 14 % (ref 11.5–15.5)
WBC: 4 K/uL (ref 4.0–10.5)
WBC: 4.4 K/uL (ref 4.0–10.5)
nRBC: 0 % (ref 0.0–0.2)
nRBC: 0 % (ref 0.0–0.2)

## 2024-05-27 LAB — HEPARIN LEVEL (UNFRACTIONATED): Heparin Unfractionated: 0.2 [IU]/mL — ABNORMAL LOW (ref 0.30–0.70)

## 2024-05-27 LAB — GLUCOSE, CAPILLARY
Glucose-Capillary: 88 mg/dL (ref 70–99)
Glucose-Capillary: 106 mg/dL — ABNORMAL HIGH (ref 70–99)
Glucose-Capillary: 183 mg/dL — ABNORMAL HIGH (ref 70–99)

## 2024-05-27 LAB — APTT: aPTT: 85 s — ABNORMAL HIGH (ref 24–36)

## 2024-05-27 LAB — TROPONIN I (HIGH SENSITIVITY): Troponin I (High Sensitivity): 155 ng/L (ref <18)

## 2024-05-27 LAB — PROTIME-INR
INR: 1.3 — ABNORMAL HIGH (ref 0.8–1.2)
Prothrombin Time: 16.4 s — ABNORMAL HIGH (ref 11.4–15.2)

## 2024-05-27 LAB — D-DIMER, QUANTITATIVE: D-Dimer, Quant: 1.16 ug{FEU}/mL — ABNORMAL HIGH (ref 0.00–0.50)

## 2024-05-27 LAB — BRAIN NATRIURETIC PEPTIDE: B Natriuretic Peptide: >4500 pg/mL — ABNORMAL HIGH (ref 0.0–100.0)

## 2024-05-27 MED ORDER — CARVEDILOL 25 MG PO TABS
25.0000 mg | ORAL_TABLET | Freq: Two times a day (BID) | ORAL | Status: DC
  Administered 2024-05-27 – 2024-05-28 (×3): 25 mg via ORAL
  Filled 2024-05-27 (×3): qty 1

## 2024-05-27 MED ORDER — IOHEXOL 350 MG/ML SOLN
100.0000 mL | Freq: Once | INTRAVENOUS | Status: AC | PRN
  Administered 2024-05-27: 100 mL via INTRAVENOUS

## 2024-05-27 MED ORDER — ONDANSETRON HCL 4 MG PO TABS: 4.0000 mg | ORAL_TABLET | Freq: Four times a day (QID) | ORAL | Status: DC | PRN

## 2024-05-27 MED ORDER — NEPRO/CARBSTEADY PO LIQD: 237.0000 mL | Freq: Two times a day (BID) | ORAL | Status: DC

## 2024-05-27 MED ORDER — HYDROMORPHONE HCL 1 MG/ML IJ SOLN
INTRAMUSCULAR | Status: AC
  Filled 2024-05-27: qty 1

## 2024-05-27 MED ORDER — CINACALCET HCL 30 MG PO TABS
60.0000 mg | ORAL_TABLET | Freq: Every day | ORAL | Status: DC
  Administered 2024-05-27 – 2024-05-28 (×2): 60 mg via ORAL
  Filled 2024-05-27 (×2): qty 2

## 2024-05-27 MED ORDER — INSULIN ASPART 100 UNIT/ML IJ SOLN
0.0000 [IU] | Freq: Three times a day (TID) | INTRAMUSCULAR | Status: DC
  Administered 2024-05-28: 1 [IU] via SUBCUTANEOUS
  Filled 2024-05-27: qty 1

## 2024-05-27 MED ORDER — ASPIRIN 81 MG PO TBEC
81.0000 mg | DELAYED_RELEASE_TABLET | Freq: Every day | ORAL | Status: DC
  Administered 2024-05-27 – 2024-05-28 (×2): 81 mg via ORAL
  Filled 2024-05-27 (×2): qty 1

## 2024-05-27 MED ORDER — ACETAMINOPHEN 325 MG PO TABS: 650.0000 mg | ORAL_TABLET | Freq: Four times a day (QID) | ORAL | Status: DC | PRN

## 2024-05-27 MED ORDER — HYDROMORPHONE HCL 1 MG/ML IJ SOLN
0.5000 mg | INTRAMUSCULAR | Status: DC | PRN
  Administered 2024-05-27: 0.5 mg via INTRAVENOUS

## 2024-05-27 MED ORDER — PANTOPRAZOLE SODIUM 40 MG PO TBEC
40.0000 mg | DELAYED_RELEASE_TABLET | Freq: Every day | ORAL | Status: DC
  Administered 2024-05-27 – 2024-05-28 (×2): 40 mg via ORAL
  Filled 2024-05-27 (×2): qty 1

## 2024-05-27 MED ORDER — HEPARIN BOLUS VIA INFUSION
4000.0000 [IU] | Freq: Once | INTRAVENOUS | Status: AC
  Administered 2024-05-27: 4000 [IU] via INTRAVENOUS
  Filled 2024-05-27: qty 4000

## 2024-05-27 MED ORDER — GUAIFENESIN-DM 100-10 MG/5ML PO SYRP
5.0000 mL | ORAL_SOLUTION | ORAL | Status: DC | PRN
  Administered 2024-05-27 – 2024-05-28 (×2): 5 mL via ORAL
  Filled 2024-05-27 (×3): qty 10

## 2024-05-27 MED ORDER — NEPRO/CARBSTEADY PO LIQD
237.0000 mL | Freq: Two times a day (BID) | ORAL | Status: DC
  Administered 2024-05-27 – 2024-05-28 (×3): 237 mL via ORAL

## 2024-05-27 MED ORDER — RENA-VITE PO TABS
1.0000 | ORAL_TABLET | Freq: Every day | ORAL | Status: DC
  Administered 2024-05-27: 1 via ORAL
  Filled 2024-05-27: qty 1

## 2024-05-27 MED ORDER — RENA-VITE PO TABS: 1.0000 | ORAL_TABLET | Freq: Every day | ORAL | Status: DC

## 2024-05-27 MED ORDER — ASPIRIN 81 MG PO CHEW
324.0000 mg | CHEWABLE_TABLET | Freq: Once | ORAL | Status: AC
  Administered 2024-05-27: 324 mg via ORAL
  Filled 2024-05-27: qty 4

## 2024-05-27 MED ORDER — ATORVASTATIN CALCIUM 20 MG PO TABS
20.0000 mg | ORAL_TABLET | Freq: Every day | ORAL | Status: DC
  Administered 2024-05-27 – 2024-05-28 (×2): 20 mg via ORAL
  Filled 2024-05-27 (×2): qty 1

## 2024-05-27 MED ORDER — HEPARIN (PORCINE) 25000 UT/250ML-% IV SOLN
1400.0000 [IU]/h | INTRAVENOUS | Status: DC
  Administered 2024-05-27: 1200 [IU]/h via INTRAVENOUS
  Filled 2024-05-27: qty 250

## 2024-05-27 MED ORDER — FUROSEMIDE 40 MG PO TABS: 40.0000 mg | ORAL_TABLET | Freq: Every day | ORAL | Status: DC

## 2024-05-27 MED ORDER — ACETAMINOPHEN 650 MG RE SUPP: 650.0000 mg | Freq: Four times a day (QID) | RECTAL | Status: DC | PRN

## 2024-05-27 MED ORDER — CHLORHEXIDINE GLUCONATE CLOTH 2 % EX PADS
6.0000 | MEDICATED_PAD | Freq: Every day | CUTANEOUS | Status: DC
  Administered 2024-05-27 – 2024-05-28 (×2): 6 via TOPICAL

## 2024-05-27 MED ORDER — HEPARIN BOLUS VIA INFUSION
1300.0000 [IU] | Freq: Once | INTRAVENOUS | Status: DC
  Filled 2024-05-27: qty 1300

## 2024-05-27 MED ORDER — LOSARTAN POTASSIUM 50 MG PO TABS
50.0000 mg | ORAL_TABLET | Freq: Every day | ORAL | Status: DC
  Administered 2024-05-27 – 2024-05-28 (×2): 50 mg via ORAL
  Filled 2024-05-27 (×2): qty 1

## 2024-05-27 MED ORDER — AMLODIPINE BESYLATE 5 MG PO TABS
5.0000 mg | ORAL_TABLET | Freq: Every day | ORAL | Status: DC
  Administered 2024-05-27: 5 mg via ORAL
  Filled 2024-05-27: qty 1

## 2024-05-27 MED ORDER — ONDANSETRON HCL 4 MG/2ML IJ SOLN: 4.0000 mg | Freq: Four times a day (QID) | INTRAMUSCULAR | Status: DC | PRN

## 2024-05-27 MED ORDER — LANTHANUM CARBONATE 500 MG PO CHEW: 1000.0000 mg | CHEWABLE_TABLET | Freq: Three times a day (TID) | ORAL | Status: DC

## 2024-05-27 NOTE — Assessment & Plan Note (Signed)
 Echo last Nov in [facility] showed EF 20-25%, mild to moderate MR - Nephrology have been consulted for HD - Consult Cardiology

## 2024-05-27 NOTE — Assessment & Plan Note (Signed)
 Burning SSCP for 1-2 weeks.  Very sedentary, so unclear if this is exertional.  Troponin low and flat, ECG nondiagnostic. - Continue heparin  gtt - Consult cardiology - Continue aspirin , Lipitor - Continue BB

## 2024-05-27 NOTE — Assessment & Plan Note (Signed)
 Glucose normal - Hold 70/30 - SS corrections for now

## 2024-05-27 NOTE — Progress Notes (Signed)
 [facility]  ROUNDING NOTE   Subjective:   Danny [name] is a 68 y.o. male with end-stage renal disease on hemodialysis, hypertension, diabetes, heart failure with reduced ejection fraction EF 20%, history of AV block status post biventricular ICD placement, anemia, anxiety, diabetic neuropathy, was admitted for Orthopnea [R06.01] Acute pulmonary edema (HCC) [J81.0] NSTEMI (non-ST elevated myocardial infarction) (HCC) [I21.4] Fluid overload [E87.70] Nonspecific chest pain [R07.9]  Patient is known to our practice and receives outpatient dialysis treatments at [facility] on a MWF schedule. Last treatment received on Wednesday.  Patient states he was having chest discomfort for the past few days. States it was an intermittent pain, unable to identify what would make it better. Has not missed any recent dialysis treatments. Seen sitting in chair, multiple family members at bedside. Having some chest discomfort now but denies radiation.   Labs on ED arrival include sodium 134, glucose 128, creatinine 4.92 with GFR 12 BNP greater 4500 with troponin 157/155, Hgb 10.7.Chest xray shows vascular congestion. CT negative for Pulmonary emboli.   We have been consulted to manage dialysis needs during this admission.   Objective:  Vital signs in last 24 hours:  Temp:  [97.5 F (36.4 C)-98.4 F (36.9 C)] 98 F (36.7 C) (08/29 0752) Pulse Rate:  [64-74] 69 (08/29 0752) Resp:  [18-19] 18 (08/29 0347) BP: (155-165)/(82-96) 165/82 (08/29 0752) SpO2:  [97 %-100 %] 97 % (08/29 0752) Weight:  [90.9 kg-94.2 kg] 94.2 kg (08/29 0353)  Weight change:  Filed Weights   05/26/24 2311 05/27/24 0353  Weight: 90.9 kg 94.2 kg    Intake/Output: I/O last 3 completed shifts: In: 12 [I.V.:12] Out: -    Intake/Output this shift:  No intake/output data recorded.  Physical Exam: General: NAD  Head: Normocephalic, atraumatic. Moist oral mucosal membranes  Eyes: Anicteric   Lungs:  Clear to auscultation  Heart: Regular rate and rhythm  Abdomen:  Soft, nontender  Extremities:  No peripheral edema.  Neurologic: Awake, alert, conversant  Skin: Warm,dry, no rash  Access: Lt AVF    Basic Metabolic Panel: Recent Labs  Lab 05/26/24 2313 05/27/24 0448  NA 134* 136  K 4.4 4.4  CL 93* 95*  CO2 24 29  GLUCOSE 128* 90  BUN 36* 40*  CREATININE 4.92* 5.27*  CALCIUM  9.5 9.5    Liver Function Tests: No results for input(s): AST, ALT, ALKPHOS, BILITOT, PROT, ALBUMIN in the last 168 hours. No results for input(s): LIPASE, AMYLASE in the last 168 hours. No results for input(s): AMMONIA in the last 168 hours.  CBC: Recent Labs  Lab 05/26/24 2313 05/27/24 0448  WBC 4.0 4.4  HGB 10.7* 10.0*  HCT 32.9* 30.5*  MCV 98.2 96.2  PLT 86* 83*    Cardiac Enzymes: No results for input(s): CKTOTAL, CKMB, CKMBINDEX, TROPONINI in the last 168 hours.  BNP: Invalid input(s): POCBNP  CBG: Recent Labs  Lab 05/27/24 0754  GLUCAP 88    Microbiology: Results for orders placed or performed during the hospital encounter of 03/14/24  Culture, blood (Routine X 2) w Reflex to ID Panel     Status: None   Collection Time: 03/14/24  6:07 AM   Specimen: BLOOD  Result Value Ref Range Status   Specimen Description BLOOD BLOOD RIGHT WRIST  Final   Special Requests   Final    BOTTLES DRAWN AEROBIC AND ANAEROBIC Blood Culture results may not be optimal due to an inadequate volume of blood received in culture bottles  Culture   Final    NO GROWTH 5 DAYS Performed at [facility], [address] [place], [place] [address]    Report Status 03/19/2024 FINAL  Final  Culture, blood (Routine X 2) w Reflex to ID Panel     Status: None   Collection Time: 03/14/24  6:08 AM   Specimen: BLOOD  Result Value Ref Range Status   Specimen Description BLOOD BLOOD RIGHT ARM  Final   Special Requests   Final    BOTTLES DRAWN AEROBIC AND ANAEROBIC  Blood Culture results may not be optimal due to an inadequate volume of blood received in culture bottles   Culture   Final    NO GROWTH 5 DAYS Performed at [facility]: [address], [address] [place], [place] [address]    Report Status 03/19/2024 FINAL  Final  MRSA Next Gen by PCR, Nasal     Status: None   Collection Time: 03/14/24  9:02 AM   Specimen: Nasal Mucosa; Nasal Swab  Result Value Ref Range Status   MRSA by PCR Next Gen NOT DETECTED NOT DETECTED Final    Comment: (NOTE) The GeneXpert MRSA Assay (FDA approved for NASAL specimens only), is one component of a comprehensive MRSA colonization surveillance program. It is not intended to diagnose MRSA infection nor to guide or monitor treatment for MRSA infections. Test performance is not FDA approved in patients less than 11 years old. Performed at [facility], [address] [place], [place] [address]     Coagulation Studies: Recent Labs    05/27/24 0448  LABPROT 16.4*  INR 1.3*    Urinalysis: No results for input(s): COLORURINE, LABSPEC, PHURINE, GLUCOSEU, HGBUR, BILIRUBINUR, KETONESUR, PROTEINUR, UROBILINOGEN, NITRITE, LEUKOCYTESUR in the last 72 hours.  Invalid input(s): APPERANCEUR    Imaging: CT Angio Chest PE W/Cm &/Or Wo Cm Result Date: 05/27/2024 CLINICAL DATA:  Positive D-dimer and chest pain, initial encounter EXAM: CT ANGIOGRAPHY CHEST WITH CONTRAST TECHNIQUE: Multidetector CT imaging of the chest was performed using the standard protocol during bolus administration of intravenous contrast. Multiplanar CT image reconstructions and MIPs were obtained to evaluate the vascular anatomy. RADIATION DOSE REDUCTION: This exam was performed according to the departmental dose-optimization program which includes automated exposure control, adjustment of the mA and/or kV according to patient size and/or use of iterative reconstruction technique. CONTRAST:  OMNIPAQUE  IOHEXOL  350  MG/ML SOLN COMPARISON:  Chest x-ray from the previous day.  01/30/2022 CT FINDINGS: Cardiovascular: Atherosclerotic calcifications of the thoracic aorta are noted. The degree of opacification is limited with regards to dissection. Heart is enlarged in size. The pulmonary artery is well visualized within normal branching pattern. No filling defect to suggest pulmonary embolism is noted. Minimal pericardial effusion is noted. Mediastinum/Nodes: Thoracic inlet is within normal limits. CT small mediastinal lymph nodes are noted. No sizable adenopathy is seen. The esophagus is within normal limits. Lungs/Pleura: Lungs are well aerated bilaterally. A few calcified granulomas are seen. No sizable parenchymal nodule is noted. No focal infiltrate or effusion is seen. Upper Abdomen: Mild ascites is noted. Nodularity of the liver is noted likely related underlying cirrhosis. No focal mass is seen. Musculoskeletal: Degenerative changes of the thoracic spine are noted. No rib abnormality is noted. Review of the MIP images confirms the above findings. IMPRESSION: No evidence of pulmonary emboli. Mild changes of cirrhosis with mild ascites. Small pericardial effusion is noted. Electronically Signed   By: Oneil Devonshire M.D.   On: 05/27/2024 02:24   DG  Chest 2 View Result Date: 05/26/2024 CLINICAL DATA:  Chest pain and shortness of breath beginning a few days ago. Cough and congestion. EXAM: CHEST - 2 VIEW COMPARISON:  03/14/2024 FINDINGS: Cardiac pacemaker. Shallow inspiration. Cardiac enlargement with pulmonary vascular congestion, progressing since prior study. Mild interstitial changes in the bases suggesting edema. Linear scarring in the left mid lung is stable. No pleural effusion or pneumothorax. Mediastinal contours appear intact. Calcification of the aorta. IMPRESSION: Progressing cardiac enlargement with pulmonary vascular congestion and mild interstitial changes, likely edema. Electronically Signed   By: Elsie Gravely M.D.   On: 05/26/2024 23:44     Medications:    heparin  1,200 Units/hr (05/27/24 0447)    amLODipine   5 mg Oral QHS   aspirin  EC  81 mg Oral Daily   atorvastatin   20 mg Oral Daily   carvedilol   25 mg Oral BID WC   Chlorhexidine  Gluconate Cloth  6 each Topical Q0600   [START ON 05/28/2024] feeding supplement (NEPRO CARB STEADY)  237 mL Oral BID BM   furosemide   40 mg Oral Daily   insulin  aspart  0-6 Units Subcutaneous TID WC   losartan   50 mg Oral Daily   [START ON 05/28/2024] multivitamin  1 tablet Oral QHS   pantoprazole   40 mg Oral Daily   acetaminophen  **OR** acetaminophen , HYDROmorphone  (DILAUDID ) injection, ondansetron  **OR** ondansetron  (ZOFRAN ) IV  Assessment/ Plan:  Mr. Ezreal Turay [name] is a 68 y.o.  male with end-stage renal disease on hemodialysis, hypertension, diabetes, heart failure with reduced ejection fraction EF 20%, history of AV block status post biventricular ICD placement, anemia, anxiety, diabetic neuropathy, was admitted for Orthopnea [R06.01] Acute pulmonary edema (HCC) [J81.0] NSTEMI (non-ST elevated myocardial infarction) (HCC) [I21.4] Fluid overload [E87.70] Nonspecific chest pain [R07.9]   Anemia of chronic kidney disease  Lab Results  Component Value Date   HGB 10.0 (L) 05/27/2024   Hgb within optimal range for renal patient, will continue to monitor.   2. End stage renal disease on hemodialysis. Last treatment on Wednesday. Will receive scheduled treatment today, UF goal 1L as tolerated. Next treatment scheduled for Monday.   3. Hypertension with chronic kidney disease, receiving amlodipine , carvedilol , furosemide , and losartan .   4. Diabetes mellitus type II with chronic kidney disease/renal manifestations: insulin  dependent. Home regimen includes 70-30. Most recent hemoglobin A1c is 5.8 on 03/14/24.   5. Secondary Hyperparathyroidism: with outpatient labs: PTH 761, phosphorus 5.8, calcium  9.8 on 05/02/24.   Lab Results   Component Value Date   PTH 359 (H) 08/01/2019   CALCIUM  9.5 05/27/2024   CAION 0.90 (L) 09/04/2022   PHOS 6.0 (H) 03/15/2024    Patient prescribed calcitriol , senispar, and sevelamer  outpatient. Will continue to monitor bone minerals.     LOS: 0 Daeja Helderman 8/29/20259:18 AM

## 2024-05-27 NOTE — Assessment & Plan Note (Signed)
 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  Some prior history of thrombocytopenia noted.  Liver nodular on CT - Trend CBC

## 2024-05-27 NOTE — H&P (Signed)
 History and Physical    Patient: Danny [name] FMW:[id number] DOB: 03-May-1956 DOA: 05/26/2024 DOS: the patient was seen and examined on 05/27/2024 PCP: Manuela Sharrell LABOR, MD  Patient coming from: Home  Chief Complaint:  Chief Complaint  Patient presents with   Chest Pain   Shortness of Breath       HPI:  68 y.o. M with ESRD on HD MWF, HFrEF EF 30% s/p BiV/ICD last Jan follows at [facility], DM, HTN, and HLD who presented with few days SOB and chest pain.    About 1-2 weeks ago, started to notice substernal burning in his chest.  This persisted and in the last few days, despite dialysis, he has felt severe shortness of breath, frequent nausea, and worse orthopnea than baseline, so he came to the ER.  In the ER, ECG showed paced rhythm.  HsTrop 155 and flat.  K 4.4, BUN normal.  BNP >4500.  D-dimer elevated and CTA chest ruled out PE or pleural effusions.    He was given aspirin  and started on heparin  gtt for NSTEMI by Dr. Cyrena and the hospitalist service was asked to evaluate.      Review of Systems  Constitutional:  Negative for chills, fever and malaise/fatigue.  HENT:  Negative for congestion and sore throat.   Respiratory:  Positive for cough and shortness of breath. Negative for hemoptysis, sputum production and wheezing.   Cardiovascular:  Positive for chest pain and orthopnea. Negative for claudication, leg swelling and PND.  All other systems reviewed and are negative.    Past Medical History:  Diagnosis Date   Diabetes mellitus without complication (HCC)    ESRD (end stage renal disease) on dialysis (HCC)    a.) T-Th-Sat   Gout    Hypertension    LBBB (left bundle branch block)    OSA on CPAP    Past Surgical History:  Procedure Laterality Date   AV FISTULA PLACEMENT     DIALYSIS/PERMA CATHETER INSERTION Right 08/11/2022   Procedure: DIALYSIS/PERMA CATHETER INSERTION;  Surgeon: Marea Selinda RAMAN, MD;  Location: [facility] INVASIVE CV LAB;  Service:  Cardiovascular;  Laterality: Right;   INSERTION OF ARTERIOVENOUS (AV) ARTEGRAFT ARM Left 09/04/2022   Procedure: INSERTION OF ARTERIOVENOUS (AV) ARTEGRAFT ARM;  Surgeon: Marea Selinda RAMAN, MD;  Location: [facility] ORS;  Service: Vascular;  Laterality: Left;   LIGATION OF ARTERIOVENOUS  FISTULA Left 09/04/2022   Procedure: LIGATION OF ARTERIOVENOUS  FISTULA;  Surgeon: Marea Selinda RAMAN, MD;  Location: [facility] ORS;  Service: Vascular;  Laterality: Left;   miscellaneous     peritoneal dialysis catheter placement and removal   REVISON OF ARTERIOVENOUS FISTULA Left 09/04/2022   Procedure: REVISON OF ARTERIOVENOUS FISTULA;  Surgeon: Marea Selinda RAMAN, MD;  Location: [facility] ORS;  Service: Vascular;  Laterality: Left;   Social History:  reports that he has never smoked. He has never used smokeless tobacco. He reports that he does not drink alcohol and does not use drugs.  No Known Allergies  Family History  Problem Relation Age of Onset   Diabetes Mother    Prostate cancer Father    Kidney disease Sister     Prior to Admission medications   Medication Sig Start Date End Date Taking? Authorizing Provider  albuterol  (VENTOLIN  HFA) 108 (90 Base) MCG/ACT inhaler Inhale 2 puffs into the lungs every 6 (six) hours as needed for wheezing. Patient not taking: Reported on 03/14/2024 07/19/18   [provider]  amLODipine  (NORVASC ) 5 MG tablet  Take 5 mg by mouth at bedtime. Patient not taking: Reported on 03/14/2024 11/05/18   [provider]  atorvastatin  (LIPITOR) 20 MG tablet Take 20 mg by mouth daily. 12/29/23 12/28/24  [provider]  calcitRIOL  (ROCALTROL ) 0.25 MCG capsule Take 1 capsule by mouth daily. Patient not taking: Reported on 03/14/2024 12/28/15   [provider]  calcium  acetate (PHOSLO ) 667 MG capsule Take 2,001 mg by mouth 3 (three) times daily with meals.  Patient not taking: Reported on 03/14/2024    [provider]  carvedilol  (COREG ) 25 MG tablet Take 25 mg by mouth 2 (two)  times daily with a meal.    [provider]  Cholecalciferol (VITAMIN D-3) 125 MCG (5000 UT) TABS Take 5,000 Units by mouth daily.    [provider]  cinacalcet  (SENSIPAR ) 60 MG tablet Take 60 mg by mouth daily. 04/10/20   [provider]  citalopram  (CELEXA ) 20 MG tablet Take 20 mg by mouth daily.  Patient not taking: Reported on 02/19/2021    [provider]  furosemide  (LASIX ) 40 MG tablet Take 1 tablet by mouth daily. Patient not taking: Reported on 08/29/2022 12/28/15   [provider]  gabapentin  (NEURONTIN ) 300 MG capsule Take 300 mg by mouth daily. Patient not taking: Reported on 03/14/2024 04/08/19   [provider]  insulin  aspart protamine- aspart (NOVOLOG  MIX 70/30) (70-30) 100 UNIT/ML injection Inject 0.05-0.1 mLs (5-10 Units total) into the skin See admin instructions. 5 units every morning and 4 units every evening Patient taking differently: Inject 3-8 Units into the skin See admin instructions. 8 units every morning and 3 units every evening 02/21/21   Wouk, Devaughn Sayres, MD  lanthanum  (FOSRENOL ) 1000 MG chewable tablet Chew 1 tablet by mouth 3 (three) times daily with meals. 02/10/24   [provider]  lisinopril  (ZESTRIL ) 20 MG tablet Take 0.5 tablets (10 mg total) by mouth daily. Patient not taking: Reported on 03/14/2024 02/21/21   Kandis Devaughn Sayres, MD  loperamide  (IMODIUM ) 2 MG capsule Take 1 capsule (2 mg total) by mouth every 6 (six) hours as needed for diarrhea or loose stools. Patient not taking: Reported on 08/29/2022 07/29/18   Sainani, Vivek J, MD  losartan  (COZAAR ) 50 MG tablet Take 50 mg by mouth daily. 12/29/23 12/28/24  [provider]  multivitamin (RENA-VIT) TABS tablet Take 1 tablet by mouth at bedtime. Patient not taking: Reported on 02/19/2021 08/04/19   Swayze, Ava, DO  Nutritional Supplements (FEEDING SUPPLEMENT, NEPRO CARB STEADY,) LIQD Take 237 mLs by mouth 2 (two) times daily between  meals. Patient not taking: Reported on 08/29/2022 08/05/19   Swayze, Ava, DO  omeprazole  (PRILOSEC  OTC) 20 MG tablet Take 20 mg by mouth daily. Patient not taking: Reported on 03/14/2024    [provider]  sevelamer  carbonate (RENVELA ) 800 MG tablet Take 800-3,200 mg by mouth See admin instructions. Take 4 tablets (3200mg ) by mouth three times daily with meals Patient not taking: Reported on 08/09/2022    [provider]  sodium zirconium cyclosilicate  (LOKELMA ) 5 g packet Take 5 g by mouth daily. Patient not taking: Reported on 08/09/2022 02/21/21   Kandis Devaughn Sayres, MD  traZODone  (DESYREL ) 50 MG tablet Take 1 tablet by mouth at bedtime. Patient not taking: Reported on 08/29/2022 04/13/20 02/26/21  [provider]    Physical Exam: Vitals:   05/26/24 2311 05/26/24 2313 05/27/24 0224  BP:  (!) 158/86 (!) 160/96  Pulse:  74 64  Resp:  19  19  Temp:  98.4 F (36.9 C)   SpO2:  100% 100%  Weight: 90.9 kg    Height: 5' 9 (1.753 m)     Elderly adult male, sitting up in couch, not able to lie flat due to orthopnea, responds to questions All history collected through daughter, who requested to be interpreter RRR, no murmurs, no peripheral edema Respiratory rate normal, lungs clear without rales or wheezes Abdomen soft, no tenderness palpation or guarding Attention normal, affect normal, judgment and insight appear normal, face symmetric, speech fluent, moves all 4 extremities with normal strength and coordination    Data Reviewed: Basic metabolic panel shows mild hyponatremia, creatinine elevated consistent with end-stage renal disease BNP above the upper limit of assay Troponin low and flat CBC shows anemia, stable thrombocytopenia D-dimer elevated, CT angiogram of the chest showed no PE, pneumonia, pleural effusion, pulmonary edema, or mass EKG, personally reviewed, shows a paced rhythm    Assessment and Plan: * NSTEMI (non-ST elevated myocardial  infarction) (HCC) Burning SSCP for 1-2 weeks.  Very sedentary, so unclear if this is exertional.  Troponin low and flat, ECG nondiagnostic. - Continue heparin  gtt - Consult cardiology - Continue aspirin , Lipitor - Continue BB   Hyponatremia Mild, asymptomatic  ESRD (end stage renal disease) on dialysis [facility]) - Nephrology have been consulted  Chronic combined systolic and diastolic CHF (congestive heart failure) (HCC) Echo last Nov in [facility] showed EF 20-25%, mild to moderate MR - Nephrology have been consulted for HD - Consult Cardiology  Hypertension BP elevated.  Reportedly has intradialytic hypotension - Continue amlodipine , Coreg , furosemide , losartan  - Hold lisinopril   Diabetes mellitus with ESRD (end-stage renal disease) (HCC) Glucose normal - Hold 70/30 - SS corrections for now  Thrombocytopenia (HCC) Likely some chronic portal HTN.  Some prior history of thrombocytopenia noted.  Liver nodular on CT - Trend CBC  Normocytic anemia Due to ESRD.  At baseline.         Advance Care Planning: Full code, confirmed with daughter  Consults: Nephrology, Dr. Sitting for routine HD Cardiology will need to be consulted in the morning  Family Communication: Daughter at the bedside  Severity of Illness: The appropriate patient status for this patient is OBSERVATION. Observation status is judged to be reasonable and necessary in order to provide the required intensity of service to ensure the patient's safety. The patient's presenting symptoms, physical exam findings, and initial radiographic and laboratory data in the context of their medical condition is felt to place them at decreased risk for further clinical deterioration. Furthermore, it is anticipated that the patient will be medically stable for discharge from the hospital within 2 midnights of admission.   Author: Lonni SHAUNNA Dalton, MD 05/27/2024 3:35 AM  For on call review [email or web address].

## 2024-05-27 NOTE — Hospital Course (Addendum)
 68 y.o. M with ESRD on HD MWF, HFrEF EF 30% s/p BiV/ICD last Jan follows at [facility], DM, HTN, and HLD who presented with few days SOB and chest pain.    About 1-2 weeks ago, started to notice substernal burning in his chest.  This persisted and in the last few days, despite dialysis, he has felt severe shortness of breath, frequent nausea, and worse orthopnea than baseline, so he came to the ER.  In the ER, ECG showed paced rhythm.  HsTrop 155 and flat.  K 4.4, BUN normal.  BNP >4500.  D-dimer elevated and CTA chest ruled out PE or pleural effusions.    He was given aspirin  and started on heparin  gtt for NSTEMI by Dr. Cyrena and the hospitalist service was asked to evaluate.

## 2024-05-27 NOTE — Consult Note (Signed)
 PHARMACY - ANTICOAGULATION CONSULT NOTE  Pharmacy Consult for Heparin  Indication: chest pain/ACS  No Known Allergies  Patient Measurements: Height: 5' 9 (175.3 cm) Weight: 90.6 kg (199 lb 11.8 oz) IBW/kg (Calculated) : 70.7 HEPARIN  DW (KG): 90.1  Vital Signs: Temp: 98 F (36.7 C) (08/29 1016) Temp Source: Oral (08/29 1016) BP: 153/83 (08/29 1041) Pulse Rate: 64 (08/29 1041)  Labs: Recent Labs    05/26/24 2313 05/27/24 0053 05/27/24 0448 05/27/24 0930  HGB 10.7*  --  10.0*  --   HCT 32.9*  --  30.5*  --   PLT 86*  --  83*  --   APTT  --   --  85*  --   LABPROT  --   --  16.4*  --   INR  --   --  1.3*  --   HEPARINUNFRC  --   --   --  0.20*  CREATININE 4.92*  --  5.27*  --   TROPONINIHS 157* 155*  --   --     Estimated Creatinine Clearance: 14.9 mL/min (A) (by C-G formula based on SCr of 5.27 mg/dL (H)).   Medical History: Past Medical History:  Diagnosis Date   Diabetes mellitus without complication (HCC)    ESRD (end stage renal disease) on dialysis (HCC)    a.) T-Th-Sat   Gout    Hypertension    LBBB (left bundle branch block)    OSA on CPAP     Medications:  No history of chronic anticoagulant use PTA noted  Assessment: 68 yo male with PMH of ESRD on HD, CHF, T2DM, HTN, and HLD presented to the ED with shortness of breath, cough and congestion. Patient also has defibrillator placed. Elevated troponin levels of 157>155. Pharmacy has been consulted to monitor and dose continuous heparin  infusion.  Baseline labs: aPTT pending, INR pending, Hgb 10.7, Plts 86(baseline is in the low 100s)  Date Time Results Comments 8/29 0930 HL=0.20 SUB-therapeutic, rate 1200 units/hr  Goal of Therapy:  Heparin  level 0.3-0.7 units/ml Monitor platelets by anticoagulation protocol: Yes   Plan:  Heparin  1300 units IV bolus x 1 dose Increase heparin  infusion rate to 1400 units/hr Check HL 8 hours after rate change and daily while on heparin  Continue to monitor H&H and  platelets  Ruston Fedora Rodriguez-Guzman PharmD, BCPS 05/27/2024 11:49 AM

## 2024-05-27 NOTE — ED Provider Notes (Signed)
 [facility] Provider Note    Event Date/Time   First MD Initiated Contact with Patient 05/27/24 0004     (approximate)   History   Chest Pain and Shortness of Breath   HPI  Danny [name] is a 68 y.o. male   Past medical history of end-stage renal disease on dialysis, anuric, diabetic, hypertension, OSA on CPAP here with his daughter for orthopnea, shortness of breath, and substernal chest tightness for the last 2 days.  Received a full dialysis session the day before presentation.  Denies cough, fevers, or any other respiratory infectious symptoms.  Denies any other acute medical complaints.  Offered Spanish interpreter patient declines opting for his daughter to translate for him.  Independent Historian contributed to assessment above: Daughter corroborates information given above  Physical Exam   Triage Vital Signs: ED Triage Vitals  Encounter Vitals Group     BP 05/26/24 2313 (!) 158/86     Girls Systolic BP Percentile --      Girls Diastolic BP Percentile --      Boys Systolic BP Percentile --      Boys Diastolic BP Percentile --      Pulse Rate 05/26/24 2313 74     Resp 05/26/24 2313 19     Temp 05/26/24 2313 98.4 F (36.9 C)     Temp src --      SpO2 05/26/24 2313 100 %     Weight 05/26/24 2311 200 lb 6.4 oz (90.9 kg)     Height 05/26/24 2311 5' 9 (1.753 m)     Head Circumference --      Peak Flow --      Pain Score 05/26/24 2311 9     Pain Loc --      Pain Education --      Exclude from Growth Chart --     Most recent vital signs: Vitals:   05/26/24 2313 05/27/24 0224  BP: (!) 158/86 (!) 160/96  Pulse: 74 64  Resp: 19 19  Temp: 98.4 F (36.9 C)   SpO2: 100% 100%    General: Awake, no distress.  CV:  Good peripheral perfusion.  Resp:  Normal effort.  Abd:  No distention.  Other:  Sitting in no respiratory distress no hypoxemia, room air.  Crackles at lung bases bilaterally.  Hypertensive slightly.   ED  Results / Procedures / Treatments   Labs (all labs ordered are listed, but only abnormal results are displayed) Labs Reviewed  BASIC METABOLIC PANEL WITH GFR - Abnormal; Notable for the following components:      Result Value   Sodium 134 (*)    Chloride 93 (*)    Glucose, Bld 128 (*)    BUN 36 (*)    Creatinine, Ser 4.92 (*)    GFR, Estimated 12 (*)    Anion gap 17 (*)    All other components within normal limits  CBC - Abnormal; Notable for the following components:   RBC 3.35 (*)    Hemoglobin 10.7 (*)    HCT 32.9 (*)    Platelets 86 (*)    All other components within normal limits  BRAIN NATRIURETIC PEPTIDE - Abnormal; Notable for the following components:   B Natriuretic Peptide >4,500.0 (*)    All other components within normal limits  D-DIMER, QUANTITATIVE - Abnormal; Notable for the following components:   D-Dimer, Quant 1.16 (*)    All other components within normal limits  TROPONIN  I (HIGH SENSITIVITY) - Abnormal; Notable for the following components:   Troponin I (High Sensitivity) 157 (*)    All other components within normal limits  TROPONIN I (HIGH SENSITIVITY) - Abnormal; Notable for the following components:   Troponin I (High Sensitivity) 155 (*)    All other components within normal limits  PROTIME-INR  APTT  HEPARIN  LEVEL (UNFRACTIONATED)     I ordered and reviewed the above labs they are notable for troponin elevated at 150s.  EKG  ED ECG REPORT I, Ginnie Shams, the attending physician, personally viewed and interpreted this ECG.   Date: 05/27/2024  EKG Time: 2308  Rate: 75  Rhythm: V-paced  Axis: nl  Intervals:nl  ST&Danny Change: no stemi    RADIOLOGY I independently reviewed and interpreted chest x-ray and see pulmonary edema I also reviewed radiologist's formal read.   PROCEDURES:  Critical Care performed: Yes, see critical care procedure note(s)  .Critical Care  Performed by: Shams Ginnie, MD Authorized by: Shams Ginnie, MD    Critical care provider statement:    Critical care time (minutes):  35   Critical care was time spent personally by me on the following activities:  Development of treatment plan with patient or surrogate, discussions with consultants, evaluation of patient's response to treatment, examination of patient, ordering and review of laboratory studies, ordering and review of radiographic studies, ordering and performing treatments and interventions, pulse oximetry, re-evaluation of patient's condition and review of old charts    MEDICATIONS ORDERED IN ED: Medications  heparin  ADULT infusion 100 units/mL (25000 units/250mL) (1,200 Units/hr Intravenous New Bag/Given 05/27/24 0134)  aspirin  chewable tablet 324 mg (324 mg Oral Given 05/27/24 0059)  heparin  bolus via infusion 4,000 Units (4,000 Units Intravenous Bolus from Bag 05/27/24 0134)  iohexol  (OMNIPAQUE ) 350 MG/ML injection 100 mL (100 mLs Intravenous Contrast Given 05/27/24 0153)    External physician / consultants:  I spoke with Dr. Dennise of nephrology and Dr. Jonel of hospital medicine regarding care plan for this patient.   IMPRESSION / MDM / ASSESSMENT AND PLAN / ED COURSE  I reviewed the triage vital signs and the nursing notes.                                Patient's presentation is most consistent with acute presentation with potential threat to life or bodily function.  Differential diagnosis includes, but is not limited to, pulmonary edema, respiratory infection, ACS, PE   The patient is on the cardiac monitor to evaluate for evidence of arrhythmia and/or significant heart rate changes.  MDM:    Here with orthopnea and evidence of pulmonary edema on clinical exam and chest x-ray, dialysis patient who is anuric will need a nonemergent dialysis session to remove extra fluid so I consulted with Dr. Dennise who is now aware of the patient.  Admit to hospital medicine service.  Chest tightness as well with nonischemic EKG, but with  increased troponin, concern for NSTEMI started on heparin  and given aspirin .  D-dimer elevated, considered PE but low clinical suspicion as he has evidence of of pulmonary edema more likely explaining his symptoms though curious because he did have a full dialysis session recently-proceeded with CT angiogram which shows no blood clot fortunately.  Admitted to hospital medicine service in stable condition.        FINAL CLINICAL IMPRESSION(S) / ED DIAGNOSES   Final diagnoses:  Acute pulmonary edema (HCC)  Orthopnea  NSTEMI (non-ST elevated myocardial infarction) (HCC)  Nonspecific chest pain     Rx / DC Orders   ED Discharge Orders     None        Note:  This document was prepared using Dragon voice recognition software and may include unintentional dictation errors.    Cyrena Mylar, MD 05/27/24 0300

## 2024-05-27 NOTE — Consult Note (Addendum)
 PHARMACY - ANTICOAGULATION CONSULT NOTE  Pharmacy Consult for Heparin  Indication: chest pain/ACS  No Known Allergies  Patient Measurements: Height: 5' 9 (175.3 cm) Weight: 90.9 kg (200 lb 6.4 oz) IBW/kg (Calculated) : 70.7 HEPARIN  DW (KG): 89.1  Vital Signs: Temp: 98.4 F (36.9 C) (08/28 2313) BP: 158/86 (08/28 2313) Pulse Rate: 74 (08/28 2313)  Labs: Recent Labs    05/26/24 2313 05/27/24 0053  HGB 10.7*  --   HCT 32.9*  --   PLT 86*  --   CREATININE 4.92*  --   TROPONINIHS 157* 155*    Estimated Creatinine Clearance: 16 mL/min (A) (by C-G formula based on SCr of 4.92 mg/dL (H)).   Medical History: Past Medical History:  Diagnosis Date   Diabetes mellitus without complication (HCC)    ESRD (end stage renal disease) on dialysis (HCC)    a.) T-Th-Sat   Gout    Hypertension    LBBB (left bundle branch block)    OSA on CPAP     Medications:  No history of chronic anticoagulant use PTA noted  Assessment: 68 yo male with PMH of ESRD on HD, CHF, T2DM, HTN, and HLD presented to the ED with shortness of breath, cough and congestion. Patient also has defibrillator placed. Elevated troponin levels of 157>155. Pharmacy has been consulted to monitor and dose continuous heparin  infusion.  Baseline labs: aPTT pending, INR pending, Hgb 10.7, Plts 86(baseline is in the low 100s)  Goal of Therapy:  Heparin  level 0.3-0.7 units/ml Monitor platelets by anticoagulation protocol: Yes   Plan:  Give 4000 units bolus x 1 Start heparin  infusion at 1200 units/hr Check anti-Xa level in 8 hours and daily while on heparin  Continue to monitor H&H and platelets  Dymir Neeson A Donye Dauenhauer 05/27/2024,1:41 AM

## 2024-05-27 NOTE — Assessment & Plan Note (Signed)
 BP elevated.  Reportedly has intradialytic hypotension - Continue amlodipine , Coreg , furosemide , losartan  - Hold lisinopril 

## 2024-05-27 NOTE — Progress Notes (Addendum)
 Patient seen and examined at the bedside.  He complains of cough, chest pain, general weakness and shortness of breath with minimal exertion.  Vital signs are stable.  He has hemodialysis on Mondays, Wednesdays and Fridays.  Consulted Dr. Dennise, nephrologist for hemodialysis today.  He has elevated troponins.  With complaint of chest pain and shortness of breath, there was some concern for NSTEMI and CHF exacerbation.  He has an ICD for EF of 20 to 25%.  Consulted cardiologist to assist with management.  Plan of care was discussed with patient, his wife, daughter and daughter-in-law at the bedside.

## 2024-05-27 NOTE — Progress Notes (Signed)
 Initial Nutrition Assessment  DOCUMENTATION CODES:   Not applicable  INTERVENTION:   -Once diet is advanced, add:   -Nepro Shake po BID, each supplement provides 425 kcal and 19 grams protein  -Renal MVI daily  NUTRITION DIAGNOSIS:   Increased nutrient needs related to chronic illness (ESRD on HD) as evidenced by estimated needs.  GOAL:   Patient will meet greater than or equal to 90% of their needs  MONITOR:   PO intake, Supplement acceptance, Diet advancement  REASON FOR ASSESSMENT:   Malnutrition Screening Tool    ASSESSMENT:   Pt with ESRD on HD MWF, HFrEF EF 30% s/p BiV/ICD last Jan follows at [facility], DM, HTN, and HLD who presented with few days of shortness of breath and chest pain.  Pt admitted with NSTEMI.   Reviewed I/O's: +12 ml x 24 hours  Spoke with pt and daughter at bedside. Pt reports not feeling very well today; he is hungry, but understanding of NPO status. Pt awaiting cardiology consult.   Pt reports he typically has a good appetite. He generally consumes 3 meals per day, 2 meals per day on HD days due to early chair time. He also consumes 1 Nepro supplement daily.   Pt and daughter unsure of EDW, but reports weight goes up and down due to HD. Per daughter, pt has lost muscle and weight since starting HD 8 years ago, however, weight has been stable over the past few years. Reviewed wt hx; pt has experienced a 4.5% wt loss over the past 2 months, which is not significant for time frame.   Discussed importance of good meal and supplement intake to promote healing. Pt amenable to resume Nepro once diet is advanced.   Medications reviewed and include protonix  and lasix .   Lab Results  Component Value Date   HGBA1C 5.8 (H) 03/14/2024   PTA DM medications are 3 units insulin  aspart protamine- aspart daily and 8 units insulin  aspart protamine- aspart.   Labs reviewed: CBGS: 88-145 (inpatient orders for glycemic control are 0-6 units insulin   aspart TID with meals).    NUTRITION - FOCUSED PHYSICAL EXAM:  Flowsheet Row Most Recent Value  Orbital Region No depletion  Upper Arm Region Mild depletion  Thoracic and Lumbar Region No depletion  Buccal Region No depletion  Temple Region No depletion  Clavicle Bone Region Mild depletion  Clavicle and Acromion Bone Region Mild depletion  Scapular Bone Region Moderate depletion  Dorsal Hand Mild depletion  Patellar Region Mild depletion  Anterior Thigh Region Mild depletion  Posterior Calf Region Mild depletion  Edema (RD Assessment) None  Hair Reviewed  Eyes Reviewed  Mouth Reviewed  Skin Reviewed  Nails Reviewed    Diet Order:   Diet Order             Diet renal/carb modified with fluid restriction Diet-HS Snack? Nothing; Fluid restriction: 1200 mL Fluid; Room service appropriate? Yes; Fluid consistency: Thin  Diet effective now                   EDUCATION NEEDS:   Education needs have been addressed  Skin:  Skin Assessment: Reviewed RN Assessment  Last BM:  05/26/24  Height:   Ht Readings from Last 1 Encounters:  05/27/24 5' 9 (1.753 m)    Weight:   Wt Readings from Last 1 Encounters:  05/27/24 90.6 kg    Ideal Body Weight:  72.7 kg  BMI:  Body mass index is 29.5 kg/m.  Estimated  Nutritional Needs:   Kcal:  1850-2050  Protein:  90-105 grams  Fluid:  1000 ml + UOP    Margery ORN, RD, LDN, CDCES Registered Dietitian III Certified Diabetes Care and Education Specialist If unable to reach this RD, please use RD Inpatient group chat on secure chat between hours of 8am-4 pm daily

## 2024-05-27 NOTE — Progress Notes (Addendum)
  Received patient in bed to unit.   Informed consent signed and in chart.    TX duration:3:30 hr     Transported by  Hand-off given to patient's nurse.    Access used: Left upper arm fistula Access issues: none   Total UF removed: 2000 L Medication(s) given: Dilaudid   Post HD VS: wnl      GEANNIE Sar LPN [facility]

## 2024-05-27 NOTE — Consult Note (Signed)
 [facility] CLINIC CARDIOLOGY CONSULT NOTE       Patient ID: Danny [name] MRN: [id number] DOB/AGE: [birth year] 68 y.o.  Admit date: 05/26/2024 Referring Physician Dr. Jens Primary Physician Manuela, Sharrell LABOR, MD Primary Cardiologist [facility] (Dr. Jama) Reason for Consultation CP, SOB, elevated trop, CHF exacerbation  HPI: Danny [name] is a 68 y.o. male  spanish speaking with a past medical history of chronic HFrEF (LVEF 20%) s/p BiV ICD (Mezzella, septal left bundle branch area pacing on 10/01/2023), ESRD on hemodialysis, hypertension, hyperlipidemia, type 2 diabetes on insulin  who presented to the ED on 05/26/2024 for worsening shortness of breath, orthopnea and cough. Of note patient seems to miss dialysis sessions intermittently.  Per [facility] cardiology note reports SPECT myocardial perfusion study done in 2024 showed no notable reversible ischemia.  Cardiology was consulted for further evaluation.  Work up in the ED notable for sodium 134, potassium 4.4, creatinine 4.92, GFR 12, hemoglobin 10.7, platelets 86.  Dimer elevated at 1.16.  CTA with no evidence of pulmonary embolism, with small pericardial effusion.  BNP elevated > 4500. CXR with cardiomegaly and pulmonary vascular congestion.  Troponins elevated and flat 157 > 155. EKG ventricular pacing rate 75 bpm without acute ischemic changes.  Patient given 1X of aspirin  324 mg, resumed on Coreg  25 mg twice daily, and started on heparin  infusion.  At the time of my evaluation this afternoon, patient was laying comfortably at hemodialysis.  We discussed patient's symptoms in further detail. Patient states he's been having worsening SOB, orthopnea and cough for past 2 weeks. Patient states he had chest pain yesterday, only associated with coughing, chest pain has resolved, no more recurrence. Patient states his cough is bothering him the most and wanting relief. Patient is currently at HD, and tolerating well. BP elevated  and HR stable.    Review of systems complete and found to be negative unless listed above    Past Medical History:  Diagnosis Date   Diabetes mellitus without complication (HCC)    ESRD (end stage renal disease) on dialysis (HCC)    a.) T-Th-Sat   Gout    Hypertension    LBBB (left bundle branch block)    OSA on CPAP     Past Surgical History:  Procedure Laterality Date   AV FISTULA PLACEMENT     DIALYSIS/PERMA CATHETER INSERTION Right 08/11/2022   Procedure: DIALYSIS/PERMA CATHETER INSERTION;  Surgeon: Marea Selinda RAMAN, MD;  Location: [facility] INVASIVE CV LAB;  Service: Cardiovascular;  Laterality: Right;   INSERTION OF ARTERIOVENOUS (AV) ARTEGRAFT ARM Left 09/04/2022   Procedure: INSERTION OF ARTERIOVENOUS (AV) ARTEGRAFT ARM;  Surgeon: Marea Selinda RAMAN, MD;  Location: [facility] ORS;  Service: Vascular;  Laterality: Left;   LIGATION OF ARTERIOVENOUS  FISTULA Left 09/04/2022   Procedure: LIGATION OF ARTERIOVENOUS  FISTULA;  Surgeon: Marea Selinda RAMAN, MD;  Location: [facility] ORS;  Service: Vascular;  Laterality: Left;   miscellaneous     peritoneal dialysis catheter placement and removal   REVISON OF ARTERIOVENOUS FISTULA Left 09/04/2022   Procedure: REVISON OF ARTERIOVENOUS FISTULA;  Surgeon: Marea Selinda RAMAN, MD;  Location: [facility] ORS;  Service: Vascular;  Laterality: Left;    Medications Prior to Admission  Medication Sig Dispense Refill Last Dose/Taking   albuterol  (VENTOLIN  HFA) 108 (90 Base) MCG/ACT inhaler Inhale 2 puffs into the lungs every 6 (six) hours as needed for wheezing. (Patient not taking: Reported on 03/14/2024)      amLODipine  (NORVASC ) 5 MG tablet Take 5  mg by mouth at bedtime. (Patient not taking: Reported on 03/14/2024)      atorvastatin  (LIPITOR) 20 MG tablet Take 20 mg by mouth daily.      calcitRIOL  (ROCALTROL ) 0.25 MCG capsule Take 1 capsule by mouth daily. (Patient not taking: Reported on 03/14/2024)      calcium  acetate (PHOSLO ) 667 MG capsule Take 2,001 mg by mouth 3 (three) times daily  with meals.  (Patient not taking: Reported on 03/14/2024)      carvedilol  (COREG ) 25 MG tablet Take 25 mg by mouth 2 (two) times daily with a meal.      Cholecalciferol (VITAMIN D-3) 125 MCG (5000 UT) TABS Take 5,000 Units by mouth daily.      cinacalcet  (SENSIPAR ) 60 MG tablet Take 60 mg by mouth daily.      citalopram  (CELEXA ) 20 MG tablet Take 20 mg by mouth daily.  (Patient not taking: Reported on 02/19/2021)      furosemide  (LASIX ) 40 MG tablet Take 1 tablet by mouth daily. (Patient not taking: Reported on 08/29/2022)      gabapentin  (NEURONTIN ) 300 MG capsule Take 300 mg by mouth daily. (Patient not taking: Reported on 03/14/2024)      insulin  aspart protamine- aspart (NOVOLOG  MIX 70/30) (70-30) 100 UNIT/ML injection Inject 0.05-0.1 mLs (5-10 Units total) into the skin See admin instructions. 5 units every morning and 4 units every evening (Patient taking differently: Inject 3-8 Units into the skin See admin instructions. 8 units every morning and 3 units every evening) 10 mL 11    lanthanum  (FOSRENOL ) 1000 MG chewable tablet Chew 1 tablet by mouth 3 (three) times daily with meals.      lisinopril  (ZESTRIL ) 20 MG tablet Take 0.5 tablets (10 mg total) by mouth daily. (Patient not taking: Reported on 03/14/2024)      loperamide  (IMODIUM ) 2 MG capsule Take 1 capsule (2 mg total) by mouth every 6 (six) hours as needed for diarrhea or loose stools. (Patient not taking: Reported on 08/29/2022) 30 capsule 0    losartan  (COZAAR ) 50 MG tablet Take 50 mg by mouth daily.      multivitamin (RENA-VIT) TABS tablet Take 1 tablet by mouth at bedtime. (Patient not taking: Reported on 02/19/2021) 30 tablet 0    Nutritional Supplements (FEEDING SUPPLEMENT, NEPRO CARB STEADY,) LIQD Take 237 mLs by mouth 2 (two) times daily between meals. (Patient not taking: Reported on 08/29/2022) 237 mL 0    omeprazole  (PRILOSEC  OTC) 20 MG tablet Take 20 mg by mouth daily. (Patient not taking: Reported on 03/14/2024)      sevelamer   carbonate (RENVELA ) 800 MG tablet Take 800-3,200 mg by mouth See admin instructions. Take 4 tablets (3200mg ) by mouth three times daily with meals (Patient not taking: Reported on 08/09/2022)      sodium zirconium cyclosilicate  (LOKELMA ) 5 g packet Take 5 g by mouth daily. (Patient not taking: Reported on 08/09/2022) 30 packet 1    traZODone  (DESYREL ) 50 MG tablet Take 1 tablet by mouth at bedtime. (Patient not taking: Reported on 08/29/2022)      Social History   Socioeconomic History   Marital status: Married    Spouse name: Not on file   Number of children: Not on file   Years of education: Not on file   Highest education level: Not on file  Occupational History   Not on file  Tobacco Use   Smoking status: Never   Smokeless tobacco: Never  Vaping Use   Vaping status: Never  Used  Substance and Sexual Activity   Alcohol use: No   Drug use: No   Sexual activity: Not on file  Other Topics Concern   Not on file  Social History Narrative   Independent at baseline   Social Drivers of Health   Financial Resource Strain: Low Risk  (03/21/2024)   [facility]   Overall Financial Resource Strain (CARDIA)    How hard is it for you to pay for the very basics like food, housing, medical care, and heating?: Not very hard  Food Insecurity: No Food Insecurity (05/27/2024)   Hunger Vital Sign    Worried About Running Out of Food in the Last Year: Never true    Ran Out of Food in the Last Year: Never true  Transportation Needs: No Transportation Needs (05/27/2024)   PRAPARE - Administrator, Civil Service (Medical): No    Lack of Transportation (Non-Medical): No  Recent Concern: Transportation Needs - Unmet Transportation Needs (03/21/2024)   Received from [facility]   [facility] - Transportation    Lack of Transportation (Medical): Yes    Lack of Transportation (Non-Medical): No  Physical Activity: Not on file  Stress: Not on file  Social Connections:  Socially Integrated (05/27/2024)   Social Connection and Isolation Panel    Frequency of Communication with Friends and Family: Three times a week    Frequency of Social Gatherings with Friends and Family: Three times a week    Attends Religious Services: 1 to 4 times per year    Active Member of Clubs or Organizations: No    Attends Banker Meetings: 1 to 4 times per year    Marital Status: Married  Catering manager Violence: Not At Risk (05/27/2024)   Humiliation, Afraid, Rape, and Kick questionnaire    Fear of Current or Ex-Partner: No    Emotionally Abused: No    Physically Abused: No    Sexually Abused: No    Family History  Problem Relation Age of Onset   Diabetes Mother    Prostate cancer Father    Kidney disease Sister      Vitals:   05/27/24 0934 05/27/24 1010 05/27/24 1016 05/27/24 1041  BP:   (!) 150/74 (!) 153/83  Pulse:   60 64  Resp: 18  18 15   Temp:   98 F (36.7 C)   TempSrc:   Oral   SpO2:   100% 100%  Weight:  90.6 kg    Height:        PHYSICAL EXAM General: Chronically ill appearing elderly male, well nourished, in no acute distress. HEENT: Normocephalic and atraumatic. Neck: No JVD.   Lungs: Normal respiratory effort on room air. Diminished bilaterally with adventitious sounds.  Heart: HRRR. Normal S1 and S2 without gallops or murmurs.  Abdomen: Non-distended appearing.  Msk: Normal strength and tone for age. Extremities: Warm and well perfused. No clubbing, cyanosis, edema.  Neuro: Alert and oriented X 3. Psych: Answers questions appropriately.   Labs: Basic Metabolic Panel: Recent Labs    05/26/24 2313 05/27/24 0448  NA 134* 136  K 4.4 4.4  CL 93* 95*  CO2 24 29  GLUCOSE 128* 90  BUN 36* 40*  CREATININE 4.92* 5.27*  CALCIUM  9.5 9.5   Liver Function Tests: No results for input(s): AST, ALT, ALKPHOS, BILITOT, PROT, ALBUMIN in the last 72 hours. No results for input(s): LIPASE, AMYLASE in the last 72  hours. CBC: Recent Labs  05/26/24 2313 05/27/24 0448  WBC 4.0 4.4  HGB 10.7* 10.0*  HCT 32.9* 30.5*  MCV 98.2 96.2  PLT 86* 83*   Cardiac Enzymes: Recent Labs    05/26/24 2313 05/27/24 0053  TROPONINIHS 157* 155*   BNP: Recent Labs    05/27/24 0053  BNP >4,500.0*   D-Dimer: Recent Labs    05/27/24 0053  DDIMER 1.16*   Hemoglobin A1C: No results for input(s): HGBA1C in the last 72 hours. Fasting Lipid Panel: No results for input(s): CHOL, HDL, LDLCALC, TRIG, CHOLHDL, LDLDIRECT in the last 72 hours. Thyroid Function Tests: No results for input(s): TSH, T4TOTAL, T3FREE, THYROIDAB in the last 72 hours.  Invalid input(s): FREET3 Anemia Panel: No results for input(s): VITAMINB12, FOLATE, FERRITIN, TIBC, IRON, RETICCTPCT in the last 72 hours.   Radiology: CT Angio Chest PE W/Cm &/Or Wo Cm Result Date: 05/27/2024 CLINICAL DATA:  Positive D-dimer and chest pain, initial encounter EXAM: CT ANGIOGRAPHY CHEST WITH CONTRAST TECHNIQUE: Multidetector CT imaging of the chest was performed using the standard protocol during bolus administration of intravenous contrast. Multiplanar CT image reconstructions and MIPs were obtained to evaluate the vascular anatomy. RADIATION DOSE REDUCTION: This exam was performed according to the departmental dose-optimization program which includes automated exposure control, adjustment of the mA and/or kV according to patient size and/or use of iterative reconstruction technique. CONTRAST:  OMNIPAQUE  IOHEXOL  350 MG/ML SOLN COMPARISON:  Chest x-ray from the previous day.  01/30/2022 CT FINDINGS: Cardiovascular: Atherosclerotic calcifications of the thoracic aorta are noted. The degree of opacification is limited with regards to dissection. Heart is enlarged in size. The pulmonary artery is well visualized within normal branching pattern. No filling defect to suggest pulmonary embolism is noted. Minimal pericardial  effusion is noted. Mediastinum/Nodes: Thoracic inlet is within normal limits. CT small mediastinal lymph nodes are noted. No sizable adenopathy is seen. The esophagus is within normal limits. Lungs/Pleura: Lungs are well aerated bilaterally. A few calcified granulomas are seen. No sizable parenchymal nodule is noted. No focal infiltrate or effusion is seen. Upper Abdomen: Mild ascites is noted. Nodularity of the liver is noted likely related underlying cirrhosis. No focal mass is seen. Musculoskeletal: Degenerative changes of the thoracic spine are noted. No rib abnormality is noted. Review of the MIP images confirms the above findings. IMPRESSION: No evidence of pulmonary emboli. Mild changes of cirrhosis with mild ascites. Small pericardial effusion is noted. Electronically Signed   By: Oneil Devonshire M.D.   On: 05/27/2024 02:24   DG Chest 2 View Result Date: 05/26/2024 CLINICAL DATA:  Chest pain and shortness of breath beginning a few days ago. Cough and congestion. EXAM: CHEST - 2 VIEW COMPARISON:  03/14/2024 FINDINGS: Cardiac pacemaker. Shallow inspiration. Cardiac enlargement with pulmonary vascular congestion, progressing since prior study. Mild interstitial changes in the bases suggesting edema. Linear scarring in the left mid lung is stable. No pleural effusion or pneumothorax. Mediastinal contours appear intact. Calcification of the aorta. IMPRESSION: Progressing cardiac enlargement with pulmonary vascular congestion and mild interstitial changes, likely edema. Electronically Signed   By: Elsie Gravely M.D.   On: 05/26/2024 23:44    ECHO 08/11/2023  1. The left ventricle is moderately dilated in size with normal wall thickness.   2. The left ventricular systolic function is severely decreased, LVEF is visually estimated at 20-25%.    3. The mitral valve leaflets are mildly thickened with normal leaflet mobility.   4. Mitral annular calcification is present (mild).    5. There is  mild to  moderate mitral valve regurgitation.    6. The left atrium is moderately to severely dilated in size.    7. The right ventricle is mildly dilated in size, with moderately reduced systolic function.    8. The right atrium is moderately dilated in size.    9. IVC size and inspiratory change suggest mildly elevated right atrial pressure. (5-10 mmHg).    TELEMETRY reviewed by me 05/27/2024: ventricular paced, rate 60s (intermittent AV paced with PVCs)  EKG reviewed by me: Ventricular pacing, rate 75 bpm  Data reviewed by me 05/27/2024: last 24h vitals tele labs imaging I/O ED provider note, admission H&P.  Principal Problem:   NSTEMI (non-ST elevated myocardial infarction) (HCC) Active Problems:   Normocytic anemia   Thrombocytopenia (HCC)   Diabetes mellitus with ESRD (end-stage renal disease) (HCC)   Hypertension   Chronic combined systolic and diastolic CHF (congestive heart failure) (HCC)   ESRD (end stage renal disease) on dialysis (HCC)   Hyponatremia    ASSESSMENT AND PLAN:  Mcihael Hinderman [name] is a 68 y.o. male  with a past medical history of chronic HFrEF (LVEF 20%) s/p BiV ICD (Mezzella, septal left bundle branch area pacing on 10/01/2023), ESRD on hemodialysis, hypertension, hyperlipidemia, type 2 diabetes on insulin  who presented to the ED on 05/26/2024 for worsening shortness of breath, orthopnea and cough.  Of note patient seems to miss dialysis sessions intermittently.  [facility] reports SPECT myocardial perfusion study done in 2024 showed no notable reversible ischemia.  Cardiology was consulted for further evaluation.  # Acute on chronic HFrEF (EF 20%, 07/2023) # s/p BiV ICD (10/01/2023) CTA with no evidence of pulmonary embolism, with small pericardial effusion.  BNP elevated > 4500. CXR with cardiomegaly and pulmonary vascular congestion.  -Echo ordered.  Further recommendations pending results. -Continue home losartan  50 mg daily. -Continue home  Coreg  25 mg  BID.  -Continue home Lasix  40 mg daily and on HD, management per nephrology. -Patient has history of intermittent hyperkalemia, will not initiate spironolactone due to this. -SGLT2 contraindicated in the setting of ES RD. -GDMT limited due to ESRD on HD.  # Demand ischemia # Hypertension # Hyperlipidemia Patient without chest pain. Troponin elevated and flat 157 > 155. EKG ventricular pacing rate 75 bpm without acute ischemic changes.  Lipids, LDL well-controlled. -Discontinue IV heparin  infusion.  -Continue aspirin  81 mg, atorvastatin  20 mg daily. -Continue home amlodipine  5 mg daily. -Continue losartan  as stated above. -Elevated and flat troponins in the setting of acute on chronic HFrEF is most consistent with demand/supply mismatch and not ACS. No further cardiac diagnostics at this time.  # ESRD on HD Patient received hemodialysis on 08/29. -Management per nephrology, appreciate recommendations.   This patient's plan of care was discussed and created with Dr. Ammon and he is in agreement.  Signed: Dorene Comfort, PA-C  05/27/2024, 11:08 AM [facility]

## 2024-05-27 NOTE — Assessment & Plan Note (Signed)
 Due to ESRD.  At baseline.

## 2024-05-27 NOTE — Assessment & Plan Note (Signed)
 Mild, asymptomatic.

## 2024-05-27 NOTE — Plan of Care (Signed)
  Problem: Education: Goal: Knowledge of General Education information will improve Description: Including pain rating scale, medication(s)/side effects and non-pharmacologic comfort measures Outcome: Progressing   Problem: Clinical Measurements: Goal: Respiratory complications will improve Outcome: Progressing   Problem: Clinical Measurements: Goal: Cardiovascular complication will be avoided Outcome: Progressing   Problem: Pain Managment: Goal: General experience of comfort will improve and/or be controlled Outcome: Progressing   Problem: Safety: Goal: Ability to remain free from injury will improve Outcome: Progressing   Problem: Activity: Goal: Risk for activity intolerance will decrease Outcome: Progressing

## 2024-05-27 NOTE — Assessment & Plan Note (Signed)
-   Nephrology have been consulted

## 2024-05-28 ENCOUNTER — Observation Stay: Admit: 2024-05-28 | Discharge: 2024-05-28 | Disposition: A | Discharge status: Home / Self Care | Payer: Self-pay

## 2024-05-28 DIAGNOSIS — I5043 Acute on chronic combined systolic (congestive) and diastolic (congestive) heart failure: Secondary | ICD-10-CM

## 2024-05-28 DIAGNOSIS — I509 Heart failure, unspecified: Secondary | ICD-10-CM

## 2024-05-28 LAB — BASIC METABOLIC PANEL WITH GFR
Anion gap: 10 (ref 5–15)
BUN: 34 mg/dL — ABNORMAL HIGH (ref 8–23)
CO2: 28 mmol/L (ref 22–32)
Calcium: 8.6 mg/dL — ABNORMAL LOW (ref 8.9–10.3)
Chloride: 94 mmol/L — ABNORMAL LOW (ref 98–111)
Creatinine, Ser: 4.42 mg/dL — ABNORMAL HIGH (ref 0.61–1.24)
GFR, Estimated: 14 mL/min — ABNORMAL LOW (ref >60)
Glucose, Bld: 102 mg/dL — ABNORMAL HIGH (ref 70–99)
Potassium: 4.8 mmol/L (ref 3.5–5.1)
Sodium: 132 mmol/L — ABNORMAL LOW (ref 135–145)

## 2024-05-28 LAB — GLUCOSE, CAPILLARY
Glucose-Capillary: 110 mg/dL — ABNORMAL HIGH (ref 70–99)
Glucose-Capillary: 164 mg/dL — ABNORMAL HIGH (ref 70–99)

## 2024-05-28 LAB — ECHOCARDIOGRAM COMPLETE
AR max vel: 1.6 cm2
AV Peak grad: 8.5 mmHg
Ao pk vel: 1.46 m/s
Area-P 1/2: 3.37 cm2
Calc EF: 22.3 %
Height: 69 in
MV M vel: 5.17 m/s
MV Peak grad: 106.9 mmHg
S' Lateral: 5.1 cm
Single Plane A2C EF: 23.1 %
Single Plane A4C EF: 21.8 %
Weight: 3111.13 [oz_av]

## 2024-05-28 LAB — PHOSPHORUS: Phosphorus: 3.9 mg/dL (ref 2.5–4.6)

## 2024-05-28 MED ORDER — AMLODIPINE BESYLATE 5 MG PO TABS: 5.0000 mg | ORAL_TABLET | Freq: Every day | ORAL | 0 refills | Status: DC

## 2024-05-28 MED ORDER — GUAIFENESIN-DM 100-10 MG/5ML PO SYRP: 5.0000 mL | ORAL_SOLUTION | Freq: Three times a day (TID) | ORAL | Status: DC | PRN

## 2024-05-28 MED ORDER — ASPIRIN 81 MG PO TBEC: 81.0000 mg | DELAYED_RELEASE_TABLET | Freq: Every day | ORAL | Status: DC

## 2024-05-28 NOTE — Progress Notes (Signed)
 SUBJECTIVE: Patient is breathing much better denies any chest pain and feels less short of breath compared to admission.   Vitals:   05/28/24 0121 05/28/24 0546 05/28/24 0803 05/28/24 1131  BP: 133/70 (!) 150/79 (!) 153/82 (!) 143/79  Pulse: 62 67 73 68  Resp: 18 17 18    Temp: 98.2 F (36.8 C) 98.1 F (36.7 C) 98.2 F (36.8 C) 97.9 F (36.6 C)  TempSrc:      SpO2: 97% 98% 97% 95%  Weight:      Height:        Intake/Output Summary (Last 24 hours) at 05/28/2024 1242 Last data filed at 05/28/2024 0900 Gross per 24 hour  Intake 720 ml  Output 2000 ml  Net -1280 ml    LABS: Basic Metabolic Panel: Recent Labs    05/27/24 0448 05/28/24 0400 05/28/24 0440  NA 136  --  132*  K 4.4  --  4.8  CL 95*  --  94*  CO2 29  --  28  GLUCOSE 90  --  102*  BUN 40*  --  34*  CREATININE 5.27*  --  4.42*  CALCIUM  9.5  --  8.6*  PHOS  --  3.9  --    Liver Function Tests: No results for input(s): AST, ALT, ALKPHOS, BILITOT, PROT, ALBUMIN in the last 72 hours. No results for input(s): LIPASE, AMYLASE in the last 72 hours. CBC: Recent Labs    05/26/24 2313 05/27/24 0448  WBC 4.0 4.4  HGB 10.7* 10.0*  HCT 32.9* 30.5*  MCV 98.2 96.2  PLT 86* 83*   Cardiac Enzymes: No results for input(s): CKTOTAL, CKMB, CKMBINDEX, TROPONINI in the last 72 hours. BNP: Invalid input(s): POCBNP D-Dimer: Recent Labs    05/27/24 0053  DDIMER 1.16*   Hemoglobin A1C: No results for input(s): HGBA1C in the last 72 hours. Fasting Lipid Panel: No results for input(s): CHOL, HDL, LDLCALC, TRIG, CHOLHDL, LDLDIRECT in the last 72 hours. Thyroid Function Tests: No results for input(s): TSH, T4TOTAL, T3FREE, THYROIDAB in the last 72 hours.  Invalid input(s): FREET3 Anemia Panel: No results for input(s): VITAMINB12, FOLATE, FERRITIN, TIBC, IRON, RETICCTPCT in the last 72 hours.   PHYSICAL EXAM General: Well developed, well nourished, in no  acute distress HEENT:  Normocephalic and atramatic Neck:  No JVD.  Lungs: Clear bilaterally to auscultation and percussion. Heart: HRRR . Normal S1 and S2 without gallops or murmurs.  Abdomen: Bowel sounds are positive, abdomen soft and non-tender  Msk:  Back normal, normal gait. Normal strength and tone for age. Extremities: No clubbing, cyanosis or edema.   Neuro: Alert and oriented X 3. Psych:  Good affect, responds appropriately  TELEMETRY: VVI paced rhythm  ASSESSMENT AND PLAN: #1 acute on chronic CHF with severe LV dysfunction left ventricular ejection fraction 22% on echocardiogram today.  Continue losartan  50 Coreg  25 once twice daily and Lasix  40 mg daily. #2 demand ischemia/hypertension/hyperlipidemia patient denies any chest pain troponin level was 157.  Continue atorvastatin  20 mg and aspirin  81 mg and amlodipine  5 mg daily.  Elevated troponin due to demand ischemia.  Patient is on guideline directed medical therapy and doing well.  Plan on discharging the patient and discussed this plan with the family in the room.   ICD-10-CM   1. Acute pulmonary edema (HCC)  J81.0     2. Orthopnea  R06.01     3. NSTEMI (non-ST elevated myocardial infarction) (HCC)  I21.4     4. Nonspecific chest pain  R07.9       Principal Problem:   Acute on chronic combined systolic and diastolic CHF (congestive heart failure) (HCC) Active Problems:   Normocytic anemia   Thrombocytopenia (HCC)   Diabetes mellitus with ESRD (end-stage renal disease) (HCC)   Hypertension   ESRD (end stage renal disease) on dialysis (HCC)   Hyponatremia   Demand ischemia [facility])    Denyse Bathe, MD, FACC 05/28/2024 12:42 PM

## 2024-05-28 NOTE — Plan of Care (Signed)

## 2024-05-28 NOTE — Progress Notes (Signed)
  Echocardiogram 2D Echocardiogram has been performed.  Danny [name] 05/28/2024, 10:58 AM

## 2024-05-28 NOTE — Progress Notes (Signed)
 [facility]  ROUNDING NOTE   Subjective:   Danny [name] is a 68 y.o. 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 Last treatment received on Wednesday.  SABRA  Update: Patient seen sitting at bedside Room air, denies shortness of breath No lower extremity edema Denies any further chest pain  Objective:  Vital signs in last 24 hours:  Temp:  [97.5 F (36.4 C)-98.2 F (36.8 C)] 98.2 F (36.8 C) (08/30 0803) Pulse Rate:  [58-75] 73 (08/30 0803) Resp:  [13-21] 18 (08/30 0803) BP: (133-167)/(70-82) 153/82 (08/30 0803) SpO2:  [96 %-100 %] 97 % (08/30 0803) Weight:  [88.2 kg] 88.2 kg (08/29 1436)  Weight change: -0.3 kg Filed Weights   05/27/24 0353 05/27/24 1010 05/27/24 1436  Weight: 94.2 kg 90.6 kg 88.2 kg    Intake/Output: I/O last 3 completed shifts: In: 252 [P.O.:240; I.V.:12] Out: 2000 [Other:2000]   Intake/Output this shift:  Total I/O In: 480 [P.O.:480] Out: -   Physical Exam: General: NAD  Head: Normocephalic, atraumatic. Moist oral mucosal membranes  Eyes: Anicteric  Lungs:  Clear to auscultation, normal effort  Heart: Regular rate and rhythm  Abdomen:  Soft, nontender  Extremities:  No peripheral edema.  Neurologic: Awake, alert, conversant  Skin: Warm,dry, no rash  Access: Lt AVF    Basic Metabolic Panel: Recent Labs  Lab 05/26/24 2313 05/27/24 0448 05/28/24 0440  NA 134* 136 132*  K 4.4 4.4 4.8  CL 93* 95* 94*  CO2 24 29 28   GLUCOSE  128* 90 102*  BUN 36* 40* 34*  CREATININE 4.92* 5.27* 4.42*  CALCIUM  9.5 9.5 8.6*    Liver Function Tests: No results for input(s): AST, ALT, ALKPHOS, BILITOT, PROT, ALBUMIN in the last 168 hours. No results for input(s): LIPASE, AMYLASE in the last 168 hours. No results for input(s): AMMONIA in the last 168 hours.  CBC: Recent Labs  Lab 05/26/24 2313 05/27/24 0448  WBC 4.0 4.4  HGB 10.7* 10.0*  HCT 32.9* 30.5*  MCV 98.2 96.2  PLT 86* 83*    Cardiac Enzymes: No results for input(s): CKTOTAL, CKMB, CKMBINDEX, TROPONINI in the last 168 hours.  BNP: Invalid input(s): POCBNP  CBG: Recent Labs  Lab 05/27/24 0754 05/27/24 1630 05/27/24 2206 05/28/24 0804  GLUCAP 88 106* 183* 110*    Microbiology: Results for orders placed or performed during the hospital encounter of 03/14/24  Culture, blood (Routine X 2) w Reflex to ID Panel     Status: None   Collection Time: 03/14/24  6:07 AM   Specimen: BLOOD  Result Value Ref Range Status   Specimen Description BLOOD BLOOD RIGHT WRIST  Final   Special Requests   Final    BOTTLES DRAWN AEROBIC AND ANAEROBIC Blood Culture results may not be optimal due to an inadequate volume of blood received in culture bottles   Culture   Final    NO GROWTH 5 DAYS Performed at [facility], [address] [place], [place] [address]    Report Status 03/19/2024 FINAL  Final  Culture, blood (Routine X 2) w Reflex to ID Panel  Status: None   Collection Time: 03/14/24  6:08 AM   Specimen: BLOOD  Result Value Ref Range Status   Specimen Description BLOOD BLOOD RIGHT ARM  Final   Special Requests   Final    BOTTLES DRAWN AEROBIC AND ANAEROBIC Blood Culture results may not be optimal due to an inadequate volume of blood received in culture bottles   Culture   Final    NO GROWTH 5 DAYS Performed at [facility], [address] [place], [place] [address]    Report Status 03/19/2024 FINAL  Final   MRSA Next Gen by PCR, Nasal     Status: None   Collection Time: 03/14/24  9:02 AM   Specimen: Nasal Mucosa; Nasal Swab  Result Value Ref Range Status   MRSA by PCR Next Gen NOT DETECTED NOT DETECTED Final    Comment: (NOTE) The GeneXpert MRSA Assay (FDA approved for NASAL specimens only), is one component of a comprehensive MRSA colonization surveillance program. It is not intended to diagnose MRSA infection nor to guide or monitor treatment for MRSA infections. Test performance is not FDA approved in patients less than 36 years old. Performed at [facility], [address] [place], [place] [address]     Coagulation Studies: Recent Labs    05/27/24 0448  LABPROT 16.4*  INR 1.3*    Urinalysis: No results for input(s): COLORURINE, LABSPEC, PHURINE, GLUCOSEU, HGBUR, BILIRUBINUR, KETONESUR, PROTEINUR, UROBILINOGEN, NITRITE, LEUKOCYTESUR in the last 72 hours.  Invalid input(s): APPERANCEUR    Imaging: CT Angio Chest PE W/Cm &/Or Wo Cm Result Date: 05/27/2024 CLINICAL DATA:  Positive D-dimer and chest pain, initial encounter EXAM: CT ANGIOGRAPHY CHEST WITH CONTRAST TECHNIQUE: Multidetector CT imaging of the chest was performed using the standard protocol during bolus administration of intravenous contrast. Multiplanar CT image reconstructions and MIPs were obtained to evaluate the vascular anatomy. RADIATION DOSE REDUCTION: This exam was performed according to the departmental dose-optimization program which includes automated exposure control, adjustment of the mA and/or kV according to patient size and/or use of iterative reconstruction technique. CONTRAST:  OMNIPAQUE  IOHEXOL  350 MG/ML SOLN COMPARISON:  Chest x-ray from the previous day.  01/30/2022 CT FINDINGS: Cardiovascular: Atherosclerotic calcifications of the thoracic aorta are noted. The degree of opacification is limited with regards to dissection. Heart is enlarged in size. The  pulmonary artery is well visualized within normal branching pattern. No filling defect to suggest pulmonary embolism is noted. Minimal pericardial effusion is noted. Mediastinum/Nodes: Thoracic inlet is within normal limits. CT small mediastinal lymph nodes are noted. No sizable adenopathy is seen. The esophagus is within normal limits. Lungs/Pleura: Lungs are well aerated bilaterally. A few calcified granulomas are seen. No sizable parenchymal nodule is noted. No focal infiltrate or effusion is seen. Upper Abdomen: Mild ascites is noted. Nodularity of the liver is noted likely related underlying cirrhosis. No focal mass is seen. Musculoskeletal: Degenerative changes of the thoracic spine are noted. No rib abnormality is noted. Review of the MIP images confirms the above findings. IMPRESSION: No evidence of pulmonary emboli. Mild changes of cirrhosis with mild ascites. Small pericardial effusion is noted. Electronically Signed   By: Oneil Devonshire M.D.   On: 05/27/2024 02:24   DG Chest 2 View Result Date: 05/26/2024 CLINICAL DATA:  Chest pain and shortness of breath beginning a few days ago. Cough and congestion. EXAM: CHEST - 2 VIEW COMPARISON:  03/14/2024 FINDINGS: Cardiac pacemaker. Shallow inspiration. Cardiac enlargement with pulmonary vascular congestion, progressing since prior study. Mild  interstitial changes in the bases suggesting edema. Linear scarring in the left mid lung is stable. No pleural effusion or pneumothorax. Mediastinal contours appear intact. Calcification of the aorta. IMPRESSION: Progressing cardiac enlargement with pulmonary vascular congestion and mild interstitial changes, likely edema. Electronically Signed   By: Elsie Gravely M.D.   On: 05/26/2024 23:44     Medications:      amLODipine   5 mg Oral QHS   aspirin  EC  81 mg Oral Daily   atorvastatin   20 mg Oral Daily   carvedilol   25 mg Oral BID WC   Chlorhexidine  Gluconate Cloth  6 each Topical Q0600   cinacalcet   60 mg  Oral Daily   feeding supplement (NEPRO CARB STEADY)  237 mL Oral BID BM   insulin  aspart  0-6 Units Subcutaneous TID WC   losartan   50 mg Oral Daily   multivitamin  1 tablet Oral QHS   pantoprazole   40 mg Oral Daily   acetaminophen  **OR** acetaminophen , guaiFENesin -dextromethorphan , HYDROmorphone  (DILAUDID ) injection, ondansetron  **OR** ondansetron  (ZOFRAN ) IV  Assessment/ Plan:  Danny [name] is a 68 y.o.  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 No need for ESA during this admission.   2. End stage renal disease on hemodialysis. Last treatment on Wednesday. Patient received dialysis yesterday, UF 2L achieved. Next treatment scheduled for Monday.   3. Hypertension with chronic kidney disease, receiving amlodipine , carvedilol , furosemide , and losartan . Blood pressure 153/82, acceptable for this patient  4. Diabetes mellitus type II with chronic kidney disease/renal manifestations: insulin  dependent. Home regimen includes 70-30. Most recent hemoglobin A1c is 5.8 on 03/14/24.   Glucose stable  5. Secondary Hyperparathyroidism: with outpatient labs: PTH 761, phosphorus 5.8, calcium  9.8 on 05/02/24.   Lab Results  Component Value Date   PTH 359 (H) 08/01/2019   CALCIUM  8.6 (L) 05/28/2024   CAION 0.90 (L) 09/04/2022   PHOS 6.0 (H) 03/15/2024    Patient prescribed calcitriol , senispar, and sevelamer  outpatient. Awaiting updated phos.     LOS: 0 Laurieanne Galloway 8/30/202510:56 AM

## 2024-05-28 NOTE — Discharge Summary (Signed)
 Physician Discharge Summary   Patient: Danny [name] MRN: [id number] DOB: 1956-05-19  Admit date:     05/26/2024  Discharge date: 05/28/24  Discharge Physician: AIDA CHO   PCP: Manuela Sharrell LABOR, MD   Recommendations at discharge:   Follow-up with PCP in 1 week Follow-up with your cardiologist at [facility] as soon as possible  Discharge Diagnoses: Principal Problem:   Acute on chronic combined systolic and diastolic CHF (congestive heart failure) (HCC) Active Problems:   Normocytic anemia   Thrombocytopenia (HCC)   Diabetes mellitus with ESRD (end-stage renal disease) (HCC)   Hypertension   ESRD (end stage renal disease) on dialysis (HCC)   Hyponatremia   Demand ischemia (HCC)  Resolved Problems:   * No resolved hospital problems. *  Hospital Course:  Danny [name] is a 68 year old man with medical history significant for ESRD on HD MWF, HFrEF EF 30% s/p BiV/ICD last Jan follows at [facility], DM, HTN, hyperlipidemia, who presented to the hospital with chest pain and shortness of breath.   He was admitted to the hospital for acute on chronic HFrEF and HFpEF.   Assessment and Plan:  Acute on chronic HFrEF and HFpEF: He underwent hemodialysis with improvement in his symptoms.  Continue carvedilol . He has an ICD in place. BNP greater than 4500 2D echo showed EF of 20 to 25%, grade 2 diastolic dysfunction, mild to moderate MR   ESRD: He was seen by a nephrologist for hemodialysis.  Continue outpatient hemodialysis as scheduled on Mondays, Wednesdays and Fridays   Elevated troponins: Troponins 157 to 155.  This is attributed to demand ischemia.  NSTEMI has been ruled out.   Type II DM: Continue NovoLog  Mix 70/30 at discharge.   Hypertension: Continue antihypertensives   Liver cirrhosis noted on CT: Compensated.  Outpatient follow-up recommended.   Comorbidities include chronic thrombocytopenia, hyperlipidemia, hypertension   He feels much  better.  Chest pain and shortness of breath have resolved.  He was ambulating in his room and he took a shower today.  His condition has improved and he is deemed stable for discharge to home today.  Discharge plan was discussed with patient, his wife at the bedside and Rufus, daughter over the phone       Consultants: Cardiologist, nephrologist Procedures performed: None Disposition: Home Diet recommendation:  Discharge Diet Orders (From admission, onward)     Start     Ordered   05/28/24 0000  Diet - low sodium heart healthy        05/28/24 1328           Renal diet DISCHARGE MEDICATION: Allergies as of 05/28/2024   No Known Allergies      Medication List     STOP taking these medications    furosemide  40 MG tablet Commonly known as: LASIX    lisinopril  20 MG tablet Commonly known as: ZESTRIL    traZODone  50 MG tablet Commonly known as: DESYREL        TAKE these medications    amLODipine  5 MG tablet Commonly known as: NORVASC  Take 1 tablet (5 mg total) by mouth at bedtime.   aspirin  EC 81 MG tablet Take 1 tablet (81 mg total) by mouth daily. Swallow whole. Start taking on: May 29, 2024   atorvastatin  20 MG tablet Commonly known as: LIPITOR Take 20 mg by mouth daily.   calcium  acetate 667 MG capsule Commonly known as: PHOSLO  Take 2,001 mg by mouth 3 (three) times daily with meals.   carvedilol   25 MG tablet Commonly known as: COREG  Take 25 mg by mouth 2 (two) times daily with a meal.   cinacalcet  60 MG tablet Commonly known as: SENSIPAR  Take 60 mg by mouth daily.   guaiFENesin -dextromethorphan  100-10 MG/5ML syrup Commonly known as: ROBITUSSIN DM Take 5 mLs by mouth every 8 (eight) hours as needed for cough.   insulin  aspart protamine- aspart (70-30) 100 UNIT/ML injection Commonly known as: NOVOLOG  MIX 70/30 Inject 0.05-0.1 mLs (5-10 Units total) into the skin See admin instructions. 5 units every morning and 4 units every evening What  changed:  how much to take additional instructions   losartan  50 MG tablet Commonly known as: COZAAR  Take 50 mg by mouth daily.   sevelamer  carbonate 800 MG tablet Commonly known as: RENVELA  Take 800-3,200 mg by mouth See admin instructions. Take 4 tablets (3200mg ) by mouth three times daily with meals   Vitamin D-3 125 MCG (5000 UT) Tabs Take 5,000 Units by mouth daily.        Follow-up Information     Jama Margery ORN, MD. Go in 1 week(s).   Specialty: Cardiology Contact information: [address] [place] [facility] [place] [address] [phone]                Discharge Exam: Fredricka Weights   05/27/24 0353 05/27/24 1010 05/27/24 1436  Weight: 94.2 kg 90.6 kg 88.2 kg   GEN: NAD, ambulatory in his room SKIN: Warm and dry EYES: No pallor or icterus ENT: MMM CV: RRR PULM: CTA B ABD: soft, ND, NT, +BS CNS: AAO x 3, non focal EXT: No edema or tenderness   Condition at discharge: good  The results of significant diagnostics from this hospitalization (including imaging, microbiology, ancillary and laboratory) are listed below for reference.   Imaging Studies: ECHOCARDIOGRAM COMPLETE Result Date: 05/28/2024    ECHOCARDIOGRAM REPORT   Patient Name:   Danny [name] Date of Exam: 05/28/2024 Medical Rec #:  [id number]                  Height:       69.0 in Accession #:    [id number]                 Weight:       194.4 lb Date of Birth:  09-02-1956                  BSA:          2.041 m Patient Age:    68 years                   BP:           150/79 mmHg Patient Gender: M                          HR:           67 bpm. Exam Location:  [facility] Procedure: 2D Echo (Both Spectral and Color Flow Doppler were utilized during            procedure). Indications:     CHF I50.9  History:         Patient has prior history of Echocardiogram examinations, most                  recent 05/29/2019.  Sonographer:     Thedora Louder RDCS, FASE Referring Phys:  [id number] DORENE COMFORT Diagnosing Phys:  Shaukat Fernand  Sonographer Comments: The patient was having coughing episodes during this exam. IMPRESSIONS  1. Left ventricular ejection fraction, by estimation, is 20 to 25%. The left ventricle has severely decreased function. The left ventricle demonstrates global hypokinesis. The left ventricular internal cavity size was severely dilated. There is mild left ventricular hypertrophy. Left ventricular diastolic parameters are consistent with Grade II diastolic dysfunction (pseudonormalization).  2. Right ventricular systolic function is moderately reduced. The right ventricular size is moderately enlarged.  3. Left atrial size was severely dilated.  4. Right atrial size was severely dilated.  5. The mitral valve is grossly normal. Mild to moderate mitral valve regurgitation.  6. The aortic valve is calcified. Aortic valve regurgitation is not visualized. Aortic valve sclerosis/calcification is present, without any evidence of aortic stenosis. FINDINGS  Left Ventricle: Left ventricular ejection fraction, by estimation, is 20 to 25%. The left ventricle has severely decreased function. The left ventricle demonstrates global hypokinesis. Strain was performed and the global longitudinal strain is indeterminate. The left ventricular internal cavity size was severely dilated. There is mild left ventricular hypertrophy. Left ventricular diastolic parameters are consistent with Grade II diastolic dysfunction (pseudonormalization). Right Ventricle: The right ventricular size is moderately enlarged. No increase in right ventricular wall thickness. Right ventricular systolic function is moderately reduced. Left Atrium: Left atrial size was severely dilated. Right Atrium: Right atrial size was severely dilated. Pericardium: There is no evidence of pericardial effusion. Mitral Valve: The mitral valve is grossly normal. Mild to moderate mitral valve regurgitation. Tricuspid Valve: The tricuspid valve is  grossly normal. Tricuspid valve regurgitation is mild. Aortic Valve: The aortic valve is calcified. Aortic valve regurgitation is not visualized. Aortic valve sclerosis/calcification is present, without any evidence of aortic stenosis. Aortic valve peak gradient measures 8.5 mmHg. Pulmonic Valve: The pulmonic valve was grossly normal. Pulmonic valve regurgitation is trivial. Aorta: The aortic root, ascending aorta and aortic arch are all structurally normal, with no evidence of dilitation or obstruction. IAS/Shunts: No atrial level shunt detected by color flow Doppler. Additional Comments: 3D was performed not requiring image post processing on an independent workstation and was indeterminate.  LEFT VENTRICLE PLAX 2D LVIDd:         5.70 cm      Diastology LVIDs:         5.10 cm      LV e' medial:    5.77 cm/s LV PW:         1.30 cm      LV E/e' medial:  23.9 LV IVS:        1.10 cm      LV e' lateral:   7.40 cm/s LVOT diam:     1.90 cm      LV E/e' lateral: 18.6 LV SV:         53 LV SV Index:   26 LVOT Area:     2.84 cm                              3D Volume EF: LV Volumes (MOD)            3D EF:        22 % LV vol d, MOD A2C: 195.0 ml LV EDV:       297 ml LV vol d, MOD A4C: 220.0 ml LV ESV:       232 ml LV vol s, MOD A2C: 150.0 ml LV SV:  66 ml LV vol s, MOD A4C: 172.0 ml LV SV MOD A2C:     45.0 ml LV SV MOD A4C:     220.0 ml LV SV MOD BP:      47.1 ml RIGHT VENTRICLE RV Basal diam:  3.90 cm RV S prime:     9.25 cm/s TAPSE (M-mode): 1.8 cm LEFT ATRIUM              Index        RIGHT ATRIUM           Index LA diam:        5.90 cm  2.89 cm/m   RA Area:     28.30 cm LA Vol (A2C):   112.0 ml 54.86 ml/m  RA Volume:   101.00 ml 49.48 ml/m LA Vol (A4C):   114.0 ml 55.84 ml/m LA Biplane Vol: 116.0 ml 56.82 ml/m  AORTIC VALVE                 PULMONIC VALVE AV Area (Vmax): 1.60 cm     PV Vmax:        0.90 m/s AV Vmax:        146.00 cm/s  PV Peak grad:   3.2 mmHg AV Peak Grad:   8.5 mmHg     RVOT Peak grad: 2  mmHg LVOT Vmax:      82.20 cm/s LVOT Vmean:     54.100 cm/s LVOT VTI:       0.187 m  AORTA Ao Root diam: 3.40 cm Ao Asc diam:  2.70 cm MITRAL VALVE                TRICUSPID VALVE MV Area (PHT): 3.37 cm     TR Peak grad:   36.7 mmHg MV Decel Time: 225 msec     TR Vmax:        303.00 cm/s MR Peak grad: 106.9 mmHg MR Mean grad: 67.0 mmHg     SHUNTS MR Vmax:      517.00 cm/s   Systemic VTI:  0.19 m MR Vmean:     384.0 cm/s    Systemic Diam: 1.90 cm MV E velocity: 138.00 cm/s MV A velocity: 44.60 cm/s MV E/A ratio:  3.09 Shaukat Khan Electronically signed by Denyse Bathe Signature Date/Time: 05/28/2024/12:07:19 PM    Final    CT Angio Chest PE W/Cm &/Or Wo Cm Result Date: 05/27/2024 CLINICAL DATA:  Positive D-dimer and chest pain, initial encounter EXAM: CT ANGIOGRAPHY CHEST WITH CONTRAST TECHNIQUE: Multidetector CT imaging of the chest was performed using the standard protocol during bolus administration of intravenous contrast. Multiplanar CT image reconstructions and MIPs were obtained to evaluate the vascular anatomy. RADIATION DOSE REDUCTION: This exam was performed according to the departmental dose-optimization program which includes automated exposure control, adjustment of the mA and/or kV according to patient size and/or use of iterative reconstruction technique. CONTRAST:  OMNIPAQUE  IOHEXOL  350 MG/ML SOLN COMPARISON:  Chest x-ray from the previous day.  01/30/2022 CT FINDINGS: Cardiovascular: Atherosclerotic calcifications of the thoracic aorta are noted. The degree of opacification is limited with regards to dissection. Heart is enlarged in size. The pulmonary artery is well visualized within normal branching pattern. No filling defect to suggest pulmonary embolism is noted. Minimal pericardial effusion is noted. Mediastinum/Nodes: Thoracic inlet is within normal limits. CT small mediastinal lymph nodes are noted. No sizable adenopathy is seen. The esophagus is within normal limits. Lungs/Pleura:  Lungs are well aerated bilaterally. A  few calcified granulomas are seen. No sizable parenchymal nodule is noted. No focal infiltrate or effusion is seen. Upper Abdomen: Mild ascites is noted. Nodularity of the liver is noted likely related underlying cirrhosis. No focal mass is seen. Musculoskeletal: Degenerative changes of the thoracic spine are noted. No rib abnormality is noted. Review of the MIP images confirms the above findings. IMPRESSION: No evidence of pulmonary emboli. Mild changes of cirrhosis with mild ascites. Small pericardial effusion is noted. Electronically Signed   By: Oneil Devonshire M.D.   On: 05/27/2024 02:24   DG Chest 2 View Result Date: 05/26/2024 CLINICAL DATA:  Chest pain and shortness of breath beginning a few days ago. Cough and congestion. EXAM: CHEST - 2 VIEW COMPARISON:  03/14/2024 FINDINGS: Cardiac pacemaker. Shallow inspiration. Cardiac enlargement with pulmonary vascular congestion, progressing since prior study. Mild interstitial changes in the bases suggesting edema. Linear scarring in the left mid lung is stable. No pleural effusion or pneumothorax. Mediastinal contours appear intact. Calcification of the aorta. IMPRESSION: Progressing cardiac enlargement with pulmonary vascular congestion and mild interstitial changes, likely edema. Electronically Signed   By: Elsie Gravely M.D.   On: 05/26/2024 23:44    Microbiology: Results for orders placed or performed during the hospital encounter of 03/14/24  Culture, blood (Routine X 2) w Reflex to ID Panel     Status: None   Collection Time: 03/14/24  6:07 AM   Specimen: BLOOD  Result Value Ref Range Status   Specimen Description BLOOD BLOOD RIGHT WRIST  Final   Special Requests   Final    BOTTLES DRAWN AEROBIC AND ANAEROBIC Blood Culture results may not be optimal due to an inadequate volume of blood received in culture bottles   Culture   Final    NO GROWTH 5 DAYS Performed at [facility], [address] [place], [place] [address]    Report Status 03/19/2024 FINAL  Final  Culture, blood (Routine X 2) w Reflex to ID Panel     Status: None   Collection Time: 03/14/24  6:08 AM   Specimen: BLOOD  Result Value Ref Range Status   Specimen Description BLOOD BLOOD RIGHT ARM  Final   Special Requests   Final    BOTTLES DRAWN AEROBIC AND ANAEROBIC Blood Culture results may not be optimal due to an inadequate volume of blood received in culture bottles   Culture   Final    NO GROWTH 5 DAYS Performed at [facility], [address] [place], [place] [address]    Report Status 03/19/2024 FINAL  Final  MRSA Next Gen by PCR, Nasal     Status: None   Collection Time: 03/14/24  9:02 AM   Specimen: Nasal Mucosa; Nasal Swab  Result Value Ref Range Status   MRSA by PCR Next Gen NOT DETECTED NOT DETECTED Final    Comment: (NOTE) The GeneXpert MRSA Assay (FDA approved for NASAL specimens only), is one component of a comprehensive MRSA colonization surveillance program. It is not intended to diagnose MRSA infection nor to guide or monitor treatment for MRSA infections. Test performance is not FDA approved in patients less than 4 years old. Performed at [facility], [address] [place], [place] [address]     Labs: CBC: Recent Labs  Lab 05/26/24 2313 05/27/24 0448  WBC 4.0 4.4  HGB 10.7* 10.0*  HCT 32.9* 30.5*  MCV 98.2 96.2  PLT 86* 83*   Basic Metabolic Panel: Recent Labs  Lab 05/26/24 2313  05/27/24 0448 05/28/24 0400 05/28/24 0440  NA 134* 136  --  132*  K 4.4 4.4  --  4.8  CL 93* 95*  --  94*  CO2 24 29  --  28  GLUCOSE 128* 90  --  102*  BUN 36* 40*  --  34*  CREATININE 4.92* 5.27*  --  4.42*  CALCIUM  9.5 9.5  --  8.6*  PHOS  --   --  3.9  --    Liver Function Tests: No results for input(s): AST, ALT, ALKPHOS, BILITOT, PROT, ALBUMIN in the last 168 hours. CBG: Recent Labs  Lab 05/27/24 0754 05/27/24 1630 05/27/24 2206 05/28/24 0804  05/28/24 1127  GLUCAP 88 106* 183* 110* 164*    Discharge time spent: greater than 30 minutes.  Signed: AIDA CHO, MD [facility] 05/28/2024

## 2024-06-08 ENCOUNTER — Encounter: Admit: 2024-06-08 | Discharge: 2024-06-08 | Attending: Internal Medicine | Primary: Internal Medicine

## 2024-06-10 ENCOUNTER — Encounter: Admit: 2024-06-10 | Discharge: 2024-06-10 | Attending: Internal Medicine | Primary: Internal Medicine

## 2024-06-12 ENCOUNTER — Emergency Department
Admission: EM | Admit: 2024-06-12 | Discharge: 2024-06-12 | Disposition: A | Discharge status: Home / Self Care | Payer: Self-pay | Attending: Emergency Medicine | Admitting: Emergency Medicine

## 2024-06-12 ENCOUNTER — Other Ambulatory Visit: Payer: Self-pay

## 2024-06-12 ENCOUNTER — Encounter: Payer: Self-pay | Admitting: Emergency Medicine

## 2024-06-12 ENCOUNTER — Emergency Department: Payer: Self-pay

## 2024-06-12 DIAGNOSIS — R0789 Other chest pain: Secondary | ICD-10-CM | POA: Insufficient documentation

## 2024-06-12 DIAGNOSIS — E119 Type 2 diabetes mellitus without complications: Secondary | ICD-10-CM | POA: Insufficient documentation

## 2024-06-12 DIAGNOSIS — I10 Essential (primary) hypertension: Secondary | ICD-10-CM | POA: Insufficient documentation

## 2024-06-12 DIAGNOSIS — N186 End stage renal disease: Secondary | ICD-10-CM

## 2024-06-12 LAB — CBC
HCT: 33.2 % — ABNORMAL LOW (ref 39.0–52.0)
Hemoglobin: 11 g/dL — ABNORMAL LOW (ref 13.0–17.0)
MCH: 31.9 pg (ref 26.0–34.0)
MCHC: 33.1 g/dL (ref 30.0–36.0)
MCV: 96.2 fL (ref 80.0–100.0)
Platelets: 115 K/uL — ABNORMAL LOW (ref 150–400)
RBC: 3.45 MIL/uL — ABNORMAL LOW (ref 4.22–5.81)
RDW: 15.3 % (ref 11.5–15.5)
WBC: 3.6 K/uL — ABNORMAL LOW (ref 4.0–10.5)
nRBC: 0 % (ref 0.0–0.2)

## 2024-06-12 LAB — BASIC METABOLIC PANEL WITH GFR
Anion gap: 17 — ABNORMAL HIGH (ref 5–15)
BUN: 72 mg/dL — ABNORMAL HIGH (ref 8–23)
CO2: 25 mmol/L (ref 22–32)
Calcium: 7.5 mg/dL — ABNORMAL LOW (ref 8.9–10.3)
Chloride: 94 mmol/L — ABNORMAL LOW (ref 98–111)
Creatinine, Ser: 8.09 mg/dL — ABNORMAL HIGH (ref 0.61–1.24)
GFR, Estimated: 7 mL/min — ABNORMAL LOW
Glucose, Bld: 149 mg/dL — ABNORMAL HIGH (ref 70–99)
Potassium: 5.5 mmol/L — ABNORMAL HIGH (ref 3.5–5.1)
Sodium: 136 mmol/L (ref 135–145)

## 2024-06-12 LAB — TROPONIN I (HIGH SENSITIVITY)
Troponin I (High Sensitivity): 35 ng/L — ABNORMAL HIGH (ref <18)
Troponin I (High Sensitivity): 36 ng/L — ABNORMAL HIGH (ref <18)

## 2024-06-12 LAB — RESP PANEL BY RT-PCR (RSV, FLU A&B, COVID)  RVPGX2
Influenza A by PCR: NEGATIVE
Influenza B by PCR: NEGATIVE
Resp Syncytial Virus by PCR: NEGATIVE
SARS Coronavirus 2 by RT PCR: NEGATIVE

## 2024-06-12 NOTE — ED Notes (Signed)
 Pt returned from xray

## 2024-06-12 NOTE — Discharge Instructions (Addendum)
 Ensure you go to dialysis first thing in the morning  If your symptoms worsen again tonight, first try sitting upright as improved positioning often helps with fluid-related symptoms  If this does not help please return to the ED immediately

## 2024-06-12 NOTE — ED Triage Notes (Signed)
 PT reports chest pain that started this morning. Denies SHOB. Pt does reports dx with COVID 2 weeks ago and has had diarrhea and vomiting. Pt last dialysis was Friday and was the only dialysis he received last week.

## 2024-06-12 NOTE — ED Notes (Signed)
 Pt verbalizes understanding of discharge instructions. Opportunity for questioning and answers were provided. Pt discharged from ED to home with family.

## 2024-06-12 NOTE — ED Notes (Signed)
 Patient transported to CT

## 2024-06-12 NOTE — ED Provider Notes (Signed)
 [facility] Provider Note    Event Date/Time   First MD Initiated Contact with Patient 06/12/24 1506     (approximate)   History   Chest Pain   HPI  [facility] Danny [name] is a 68 y.o. male who presents to the ED for evaluation of Chest Pain   Review of medical DC summary from 2 weeks ago.  Dialysis patient with history of reduced EF of 30% with biventricular pacer/ICD.  Admitted for volume overload.  Otherwise HTN, DM and cirrhosis.  Patient presents to the ED with his daughter for evaluation of chest discomfort and shortness of breath just today.  He missed 2 sessions of dialysis last week (Monday and Wednesday) due to feeling unwell at home, multiple people in the house had COVID last week.  He was dialyzed on Friday.  Spanish interpreter utilized for history and physical   Physical Exam   Triage Vital Signs: ED Triage Vitals  Encounter Vitals Group     BP 06/12/24 1503 138/71     Girls Systolic BP Percentile --      Girls Diastolic BP Percentile --      Boys Systolic BP Percentile --      Boys Diastolic BP Percentile --      Pulse Rate 06/12/24 1503 63     Resp 06/12/24 1503 (!) 22     Temp 06/12/24 1503 98.5 F (36.9 C)     Temp Source 06/12/24 1503 Oral     SpO2 06/12/24 1503 100 %     Weight --      Height --      Head Circumference --      Peak Flow --      Pain Score 06/12/24 1459 8     Pain Loc --      Pain Education --      Exclude from Growth Chart --     Most recent vital signs: Vitals:   06/12/24 1700 06/12/24 1800  BP: 139/71 (!) 148/74  Pulse: 64 63  Resp: 18 18  Temp:    SpO2: 98% 98%    General: Awake, no distress.  CV:  Good peripheral perfusion.  Resp:  Normal effort.  Abd:  No distention.  MSK:  No deformity noted.  Neuro:  No focal deficits appreciated. Other:     ED Results / Procedures / Treatments   Labs (all labs ordered are listed, but only abnormal results are displayed) Labs Reviewed   BASIC METABOLIC PANEL WITH GFR - Abnormal; Notable for the following components:      Result Value   Potassium 5.5 (*)    Chloride 94 (*)    Glucose, Bld 149 (*)    BUN 72 (*)    Creatinine, Ser 8.09 (*)    Calcium  7.5 (*)    GFR, Estimated 7 (*)    Anion gap 17 (*)    All other components within normal limits  CBC - Abnormal; Notable for the following components:   WBC 3.6 (*)    RBC 3.45 (*)    Hemoglobin 11.0 (*)    HCT 33.2 (*)    Platelets 115 (*)    All other components within normal limits  TROPONIN I (HIGH SENSITIVITY) - Abnormal; Notable for the following components:   Troponin I (High Sensitivity) 35 (*)    All other components within normal limits  TROPONIN I (HIGH SENSITIVITY) - Abnormal; Notable for the following components:   Troponin I (  High Sensitivity) 36 (*)    All other components within normal limits  RESP PANEL BY RT-PCR (RSV, FLU A&B, COVID)  [id number]    EKG Dual paced rhythm with a rate of 69 bpm leftward axis, left bundle morphology without STEMI.  RADIOLOGY 2 view CXR interpreted by me with pulmonary vascular congestion  Official radiology report(s): DG Chest 2 View Result Date: 06/12/2024 EXAM: 2 VIEW(S) XRAY OF THE CHEST 06/12/2024 03:24:00 PM COMPARISON: 05/26/2024 CLINICAL HISTORY: PT reports chest pain that started this morning. Denies SHOB. Pt does reports dx with COVID 2 weeks ago and has had diarrhea and vomiting. Pt last dialysis was Friday and was the only dialysis he received last week. FINDINGS: LUNGS AND PLEURA: Mildly prominent interstitial lung markings, similar to prior, likely edema. No focal pulmonary opacity. No pulmonary edema. No pleural effusion. No pneumothorax. HEART AND MEDIASTINUM: Unchanged mild cardiomegaly. Atherosclerotic calcifications of the aortic arch. Right subclavian approach multi lead cardiac rhythm maintenance device stable in position. No acute abnormality of the cardiac and mediastinal silhouettes. BONES AND SOFT  TISSUES: No acute osseous abnormality. IMPRESSION: 1. Mildly prominent interstitial lung markings, likely edema, similar to prior. 2. Unchanged mild cardiomegaly and atherosclerotic calcifications of the aortic arch. Electronically signed by: Lonni Necessary MD 06/12/2024 04:12 PM EDT RP Workstation: [id number]    PROCEDURES and INTERVENTIONS:  .1-3 Lead EKG Interpretation  Performed by: Claudene Rover, MD Authorized by: Claudene Rover, MD     Interpretation: normal     ECG rate:  66   ECG rate assessment: normal     Rhythm: sinus rhythm     Ectopy: none     Conduction: normal     Medications - No data to display   IMPRESSION / MDM / ASSESSMENT AND PLAN / ED COURSE  I reviewed the triage vital signs and the nursing notes.  Differential diagnosis includes, but is not limited to, ACS, PTX, PNA, muscle strain/spasm, PE, dissection, anxiety, pleural effusion, volume overload  {Patient presents with symptoms of an acute illness or injury that is potentially life-threatening.  Dialysis patient presents with chest discomfort in the setting of missing multiple dialysis sessions this week.  EKG is nonischemic and has flat troponins that are minimally elevated.  Potassium of 5.5 without EKG changes, negative viral swabs, baseline hemoglobin.  I offered admission to facilitate dialysis but they declined indicating he has hemodialysis first thing in the morning and they would prefer to go home tonight.  He is asymptomatic on reassessments and I think this is reasonable to try.  Discussed very close return precautions for the ED.  Clinical Course as of 06/12/24 2350  Sun Jun 12, 2024  1817 Reassessed.  Patient reports feeling better and has no complaints.  Discussed plan of care, they would prefer to go home and get dialyzed first thing in the morning, he is scheduled for 6:30 AM. [DS]  1818 We discussed close ED return precautions and expectant management tonight [DS]    Clinical Course User  Index [DS] Claudene Rover, MD     FINAL CLINICAL IMPRESSION(S) / ED DIAGNOSES   Final diagnoses:  Other chest pain  ESRD (end stage renal disease) (HCC)     Rx / DC Orders   ED Discharge Orders     None        Note:  This document was prepared using Dragon voice recognition software and may include unintentional dictation errors.   Claudene Rover, MD 06/12/24 [phone]

## 2024-06-16 MED FILL — LOSARTAN 50 MG TABLET: ORAL | 90 days supply | Qty: 90 | Fill #1

## 2024-06-16 MED FILL — ATORVASTATIN 20 MG TABLET: ORAL | 90 days supply | Qty: 90 | Fill #1

## 2024-06-20 MED ORDER — CARVEDILOL 25 MG TABLET
ORAL_TABLET | Freq: Two times a day (BID) | ORAL | 2 refills | 90.00000 days | Status: CP
Start: 2024-06-20 — End: 2025-06-20
  Filled 2024-07-01: qty 180, 90d supply, fill #0

## 2024-07-01 ENCOUNTER — Encounter: Admit: 2024-07-01 | Discharge: 2024-07-01

## 2024-07-01 DIAGNOSIS — R188 Other ascites: Principal | ICD-10-CM

## 2024-07-01 DIAGNOSIS — R5383 Other fatigue: Principal | ICD-10-CM

## 2024-07-01 DIAGNOSIS — N186 End stage renal disease: Principal | ICD-10-CM

## 2024-07-01 DIAGNOSIS — Z794 Long term (current) use of insulin: Principal | ICD-10-CM

## 2024-07-01 DIAGNOSIS — E1122 Type 2 diabetes mellitus with diabetic chronic kidney disease: Principal | ICD-10-CM

## 2024-07-01 DIAGNOSIS — Z992 Dependence on renal dialysis: Principal | ICD-10-CM

## 2024-07-01 NOTE — Progress Notes (Signed)
 Internal Medicine Clinic Visit  Reason for visit: Hospital Follow Up  A/P:     1. Type 2 diabetes mellitus with chronic kidney disease on chronic dialysis, with long-term current use of insulin  (CMS-HCC)   2. Diabetic retinopathy associated with type 2 diabetes mellitus, macular edema presence unspecified, unspecified laterality, unspecified retinopathy severity (CMS-HCC)   3. Heart failure with reduced ejection fraction (CMS-HCC)   4. Ascites of liver   5. Confusion   6. Fatigue, unspecified type   7. ESRD (end stage renal disease) (CMS-HCC)   8. Tinnitus, unspecified laterality   9. Anemia, unspecified type   10. Thrombocytopenia   11. Hospital discharge follow-up      Type 2 diabetes mellitus Last A1c 6.1 in June 2024.  Will obtain A1c today.  He notes ongoing use of insulin  70/30 using 5 units in the morning and 3 units in the evening.  He denies any hypoglycemia. - A1c check - May be able to consider discontinuation of the insulin  if A1c well-controlled given his ESRD status - Continue insulin  70/30 5 units in the morning and 3 units in the evening  2. Vision Loss  High Risk Diabetic Retinopathy  Retinal Traction Detachment Endorses ongoing issues with vision loss.  I referred him to ophthalmology in 2024.  He examined then and recommended for surgery.  He did not present for follow-up and did not present for the surgery scheduled.  His daughter notes that they no need to go back to see the eye doctor and he is very distressed by his vision loss.  Strongly encouraged him to call back to the ophthalmology clinic for follow-up provide them with the phone number today. - Encouraged follow-up with ophthalmology given he had been lost to follow-up and needs possible eye surgery  3. Heart failure with reduced ejection fraction, LVEF 20% status post biventricular ICD Left bundle branch block, first-degree AV block Hypertension Follows with [facility] cardiology although it appears he  missed his last appointment.  On exam without signs of acute fluid overload. BP 132/51 today. He does endorse some periods of orthostasis with dizziness upon standing but also notes periods of significant HTN when checked in other settings.  Amlodipine  had been started when he was hospitalized at an outside hospital but he self discontinued that because he felt that it led to a whooshing sound in his ear.  Given his advanced heart failure I advised that that medicine is not likely preferred among his antihypertensive options and that we would consider up titration of his existing medications if ongoing issues.  However given his history today endorsing possible orthostasis I do not feel that further titration is indicated.  Encouraged use of compression stockings as tolerated. No new issues with chest pain.  Today recommended he continue current medications. -Continue Coreg  25 mg twice daily. - Continue losartan  50 mg daily - SGLT2 and spironolactone contraindicated in the setting of ESRD - SPECT myocardial perfusion study 2024 shows no notable reversible ischemia.  Holding on left heart catheterization for now. - Repeat echocardiogram 04/2024 revealed no significant change in LVEF 20-25%.  4. Confusion  Agitation  Low Mood  Fatigue Patient's daughter noted that over the last several months she has developed concerns where he has periods of confusion and agitation.  He will become angry and not remember things related to dates and names.  He notes that he does feel confused sometimes and is not sure why. He also notes low mood and fatigue though he does  not think he is depressed. PHQ-9 today 21. They are interested in having further neurocognitive testing performed at a future visit. He is not interested in medication or counseling at this time. Encouraged him to consider these in the future.  - Scheduled for 1 month follow-up with the intention to perform cognitive testing.   -- Will check TSH  today. --Plan for B12 test at next visit  5. Possible Ascites on Imaging CTA performed at outside hospital read as having mild ascites and possible nodular pattern on liver possibly suggestive of cirrhosis.  This notably not been seen on previous imaging.  He has no significant past history of cirrhosis previously noted on exam or on imaging.  I am unable to review the images myself.  On exam without abdominal swelling or hepatomegaly appreciated.  Will obtain LFTs today.  May consider future imaging of liver if higher clinical suspicion in the future. - LFTs  6. ESRD on iHD Continues to follow with local dialysis center.  Multiple trips to the ED recently after missing dialysis sessions due to fatigue.  Strongly encouraged to follow-up as able.  On exam without signs of fluid overload as above. - Continue dialysis in outpatient setting  7. Tinnitus  Cerumen Build Up Endorses a few weeks of tinnitus that she associates with the amlodipine  that he had been taking which he notes had worsened.  Now that he stopped that it is feeling better.  Cerumen noted on exam and removed during visit.  May consider audiology referral if refractory. - Continue to monitor - S/p cerumen removal  8. Anemia  Thrombocytopenia Anemia and thrombocytopenia noted while in the hospital.  Will repeat CBC today.  May consider role of anemia in the setting of his kidney disease.  The driver of his thrombocytopenia is a little less clear.  May consider if the above liver issues are indeed significant though kidney disease may also be playing a role. -CBC -Consider additional anemia pending resutls  9.  Healthcare maintenance At next visit we will recommend colon cancer screening and retinal eye exam although hopefully he will reestablish with ophthalmology by that visit.  Return in about 1 month (around 08/01/2024).  Staffed with Dr. Marta Hamilton, seen and  discussed  __________________________________________________________  HPI: Interpreter [phone]  He notes this was hospitalized recently after missing dialysis and having some trouble with fluid.  They started him on amlodipine  because of high blood pressure.  He did not like this medicine as it made him feel like there is a whooshing sound in his ear.  He compares it to being with his head sticking outside of a car.  This has gotten better since he stopped it a few weeks ago but it still bothers him some.  He also endorses dizziness when he goes from sitting to standing that has been chronic.  His fluid has been stable overall except when he misses dialysis.  He misses it occasionally because he is tired.  He does not note any issues with transportation.  He is worried about his vision loss and has not seen the eye doctor in about one year. He was slated for eye surgery but there have been delays due to weather and then they forgot to go to appointments.  They know he may need to go back.  He is not sure what the phone number is.  For diabetes he is using 5 units of insulin  in the morning and 3 units in the evening.  He denies  any low blood glucoses.  He notes that he had to receive sliding scale insulin  while he was in the emergency department and hospitalized recently.  He was unaware of any liver findings on imaging while he was at the other hospital.  He denies any history of family liver issues or known hepatitis infections.  He notes he has never drank heavily.  His last drink was more than 45 years ago. He denies any knowledge of liver problems and has never drank alcohol heavily. Last drink 45 years ago.   His daughter notes concern about confusion.  He notes he feels he gets very agitated and very fatigued His daughter notes he gets angry easily and is confused  She is worried he is not remembering things (dates and names).    PHQ-9 Score: PHQ-9 Total Score: 21     Hospital  Course  Recently hospitalized at [facility] after missing a few dialysis sessions and showing up with chest pain  ESRD: He was seen by a nephrologist for hemodialysis.  Continue outpatient hemodialysis as scheduled on Mondays, Wednesdays and Fridays    Elevated troponins: Troponins 157 to 155.  This is attributed to demand ischemia.  NSTEMI has been ruled out.    Type II DM: Continue NovoLog  Mix 70/30 at discharge.   Hypertension: Continue antihypertensives    Liver cirrhosis noted on CT: Compensated.  Outpatient follow-up recommended.    Comorbidities include chronic thrombocytopenia, hyperlipidemia, hypertension __________________________________________________________    Medications:  Reviewed in EPIC __________________________________________________________  Physical Exam:  Vital Signs: Vitals:   07/01/24 1543  BP: 132/51  BP Site: L Arm  BP Position: Sitting  BP Cuff Size: Small  Pulse: 75  Resp: 18  Temp: 35.8 C (96.5 F)  TempSrc: Temporal  SpO2: 96%  Weight: 86.9 kg (191 lb 9.6 oz)  Height: 172.7 cm (5' 8)      PTHomeBP <redacted file path>  The patient's Average Home Blood Pressure during the last two weeks is :   /   based on  readings   Gen: Well appearing, NAD HEENT: Mild cerumen in right ear. No signs of ear drum inflammation.  CV: RRR, no murmurs Pulm: CTA bilaterally, no crackles or wheezes Abd: Soft, NTND, normal BS. No hepatomegaly Ext: No edema   PHQ-9 Score: PHQ-9 Total Score: 21 GAD-7 Score:    Medication adherence and barriers to the treatment plan have been addressed. Opportunities to optimize healthy behaviors have been discussed. Patient / caregiver voiced understanding.

## 2024-07-06 MED ORDER — INSULIN ASPAR PROT-INSULIN ASPART 100 UNIT/ML (70-30) SUBCUTANEOUS PEN
SUBCUTANEOUS | 2 refills | 137.00000 days | Status: CN
Start: 2024-07-06 — End: 2025-07-06

## 2024-07-15 DIAGNOSIS — Z992 Dependence on renal dialysis: Principal | ICD-10-CM

## 2024-07-15 DIAGNOSIS — H9311 Tinnitus, right ear: Principal | ICD-10-CM

## 2024-07-15 DIAGNOSIS — Z95810 Presence of automatic (implantable) cardiac defibrillator: Principal | ICD-10-CM

## 2024-07-15 DIAGNOSIS — I447 Left bundle-branch block, unspecified: Principal | ICD-10-CM

## 2024-07-15 DIAGNOSIS — N186 End stage renal disease: Principal | ICD-10-CM

## 2024-07-15 DIAGNOSIS — I10 Essential (primary) hypertension: Principal | ICD-10-CM

## 2024-07-15 DIAGNOSIS — I5020 Unspecified systolic (congestive) heart failure: Principal | ICD-10-CM

## 2024-07-15 DIAGNOSIS — Z09 Encounter for follow-up examination after completed treatment for conditions other than malignant neoplasm: Principal | ICD-10-CM

## 2024-07-15 DIAGNOSIS — E1122 Type 2 diabetes mellitus with diabetic chronic kidney disease: Principal | ICD-10-CM

## 2024-07-15 DIAGNOSIS — Z794 Long term (current) use of insulin: Principal | ICD-10-CM

## 2024-07-15 DIAGNOSIS — E785 Hyperlipidemia, unspecified: Principal | ICD-10-CM

## 2024-07-15 MED ORDER — LOSARTAN 100 MG TABLET
ORAL_TABLET | Freq: Every day | ORAL | 3 refills | 90.00000 days | Status: CP
Start: 2024-07-15 — End: 2025-07-15
  Filled 2024-07-22: qty 90, 90d supply, fill #0

## 2024-07-22 NOTE — Progress Notes (Signed)
 Otolaryngology - Otology/Neurotology New Visit  HPI: Danny [name] is a 68 y.o. male is seen in consultation at the request of Dr. Jama for evaluation of right-sided tinnitus. PMH of right asymmetric SNHL. Per chart review, he saw Dr. Rubbie in 2016 for bilateral hissing tinnitus and was found to have right asymmetric SNHL. MRI was ordered for asymmetry, and this was negative for IAC pathology.  History of Present Illness About 2-3 months ago, he experienced a sudden buzzing noise in his right ear, followed by a gradual but significant hearing loss. He reports that his hearing progressed from normal to approximately 10% of his usual hearing capacity. He does not believe hearing loss was sudden. No associated URI or infection around the time of onset. No significant dizziness with this. No history of trauma or surgery in his ears. No issue with pain or drainage in his ears. No issue with his left ear.   Of note, he has end-stage renal failure and is on dialysis three times a week. He also has a pacemaker, which was placed in January of this year. He initially suspected his blood pressure medication might be causing the buzzing, but a change in medication did not resolve the issue.   PMH: Past Medical History[1]   PSH: Past Surgical History[2]   Meds: Medications Ordered Prior to Encounter[3]  Allergies: Allergies[4]   FH: Family History[5]  SH: Short Social History[6]   ROS: Review of systems was reviewed on attached notes/patient intake forms.   Exam: There were no vitals taken for this visit.  General: well appearing, stated age, no distress  Head - atraumatic, normocephalic  Nose: dorsum midline, no rhinorrhea Neck: symmetric, trachea midline Psychiatric: alert and oriented, appropriate mood and affect  Respiratory: no audible wheezing or stridor, normal work of breathing Neurologic - cranial nerves 2-12 grossly intact Facial Strength - HB 1/6 bilaterally  Ears -  External ear- normal, no lesions, no malformations  Otoscopy - Bilateral EAC clear, TM intact.  Tuning fork test - N/A  Audiogram: The audiogram was personally reviewed and interpreted. This demonstrates mild rising to normal sloping to moderately-severe sensorineural hearing loss in the left ear and moderately-severe sloping to profound sensorineural hearing loss in the right ear. WRS 0% right, 100% left.    (12/01/14)  Tympanogram: The tympanogram was personally reviewed and interpreted. This demonstrates type A bilaterally  Imaging:  MRI w/ w/o contrast: 2016   I personally reviewed outside records.  Assessment/Plan: Danny [name] Danny [name] is a 68 y.o. male with a history of Stage IV CKD, right asymmetric hearing loss, here today for sudden right tinnitus and decreased hearing in his right ear. We reviewed his Audiogram, which shows moderately-severe sloping to profound sensorineural hearing loss in the right ear. They reported symptoms started 2-3 months ago, and we discussed that he is outside the treatment window for steroid treatment. He had an MRI in 2016, negative for IAC pathology; however, given new, sudden tinnitus and significant decline in hearing, will obtain an updated MRI CN8 to rule out IAC pathology. Given the history of CKD on dialysis, I will order this without contrast. Of note, he recently had a pacemaker placed in January, and they think this is MRI compatible, though not entirely sure.   The origin of tinnitus was discussed at length with the patient. They were recommended to utilize masking strategies and distraction strategies for tinnitus. We also discussed options for hearing aids, which may help with tinnitus and hearing loss,  including traditional amplification and CROS system. I will refer him for this. We also briefly discussed the surgical option; however, the patient currently has [employer], and this is not covered. I will plan to see him back in 6  months with AOA to reassess.   The patient/family voiced understanding of the plan as detailed above and is in agreement. I appreciate the opportunity to participate in his care.  I attest to the above information and documentation. However, this note has been created using voice recognition software and may have errors that were not dictated and not seen in editing.        [1] Past Medical History: Diagnosis Date  . 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 Chronic kidney disease, stage IV (severe)    (CMS-HCC)   . Diabetes mellitus    (CMS-HCC) 1992   Type II  . Diabetic nephropathy    (CMS-HCC)   . Episode of recurrent major depressive disorder 01/05/2017  . Gout 03/17/2013  . Hyperkalemia 10/02/2016  . Hyperlipidemia   . Hypertension   . Hypoglycemia associated with type 2 diabetes mellitus    (CMS-HCC) 02/16/2015  . Left ankle pain 02/25/2014  . Pulmonary edema (HHS-HCC) 05/13/2016  [2] Past Surgical History: Procedure Laterality Date  . PR CREAT AV FISTULA,AUTOGENOUS GRAFT Left 09/18/2015   Procedure: CREATE AV FISTULA (SEPART PROC); AUTOG GFT;  Surgeon: Marsa Sam Boss, MD;  Location: MAIN [facility];  Service: Transplant  . PR CREAT AV FISTULA,NON-AUTOGENOUS GRAFT Left 03/27/2023   Procedure: CREATE AV FISTULA (SEPARATE PROC); NONAUTOGENOUS GRAFT (EG, BIOLOGICAL COLLAGEN, THERMOPLASTIC GRAFT), UPPER EXTREMITY;  Surgeon: Marchelle Kitchens, MD;  Location: [facility] OR [facility];  Service: General Surgery  . PR INSJ/RPLCMT PERM DFB W/TRNSVNS LDS 1/DUAL CHMBR N/A 10/01/2023   Procedure: Implant Biventricular ICD System;  Surgeon: Mazzella, Anthony James, MD;  Location: [facility] EP;  Service: Cardiology  . PR LAP INSERTION TUNNELED INTRAPERITONEAL CATHETER N/A 01/18/2016   Procedure: LAPAROSCOPY, SURGICAL; WITH INSERTION OF INTRAPERITONEAL CANNULA OR CATHETER, PERMANENT;  Surgeon: Marsa Sam Boss, MD;  Location: MAIN OR [facility];  Service: Transplant  . PR REBL VES DIRECT,UP EXTREM  Left 03/27/2023   Procedure: REPR BLD VESSEL DIRECT; UPPER EXTREM;  Surgeon: Marchelle Kitchens, MD;  [facility];  Service: General Surgery  . PR REMOVAL TUNNELED INTRAPERITONEAL CATHETER N/A 04/30/2016   Procedure: Removal Of Permanent Intraperitoneal Cannula Or Catheter;  Surgeon: Marsa Sam Boss, MD;  Location: MAIN OR [facility];  Service: Transplant  [3] Current Outpatient Medications on File Prior to Visit  Medication Sig Dispense Refill  . [EXPIRED] acetaminophen  325 mg cap Take 650 mg by mouth.    . atorvastatin  (LIPITOR) 20 MG tablet Take 1 tablet (20 mg total) by mouth daily. 90 tablet 3  . blood sugar diagnostic (GLUCOSE BLOOD) Strp Test blood sugar 3 times daily - before breakfast, lunch and dinner and with signs/symptoms of hypoglycemia. 240 strip 3  . calcium  acetate,phosphat bind, (PHOSLO ) 667 mg capsule Take 2 capsules (1,334 mg total) by mouth Three (3) times a day with a meal. (Patient not taking: Reported on 07/01/2024) 180 capsule 11  . carvedilol  (COREG ) 25 MG tablet Take 1 tablet (25 mg total) by mouth in the morning and 1 tablet (25 mg total) in the evening. Take with meals. 180 tablet 2  . cholecalciferol, vitamin D3, (D3 DOTS ORAL) Take by mouth. (Patient not taking: Reported on 07/01/2024)    . cinacalcet  (SENSIPAR ) 90 MG tablet Take 1 tablet (90  mg total) by mouth daily. (Patient taking differently: Take 1 tablet (90 mg total) by mouth every Monday, Wednesday, and Friday.) 30 tablet 11  . insulin  aspart protamine-insulin  aspart (NOVOLOG  MIX 70-30FLEXPEN U-100) 100 unit/mL (70-30) injection pen Inject 0.08 mL (8 Units total) under the skin every morning AND 0.03 mL (3 Units total) every evening. Discard any unused portion of pen 28 days after first use. 15 mL 2  . insulin  syringe-needle U-100 Syrg Inject insulin  BID 200 each 3  . losartan  (COZAAR ) 100 MG tablet Take 1 tablet (100 mg total) by mouth daily. 90 tablet 3  . pen needle, diabetic 31 gauge x 5/16 (8  mm) Ndle Use as directed for insulin  administration twice daily 400 each 4  . sodium chloride  0.9% SolP 2 mL with STUDY NICHD [id number] iron dextran Syrg Infuse into a venous catheter once.     No current facility-administered medications on file prior to visit.  [4] No Known Allergies [5] Family History Problem Relation Age of Onset  . No Known Problems Mother   . Cancer Father        Prostate per pt  . No Known Problems Sister   . Cancer Brother   . No Known Problems Maternal Aunt   . No Known Problems Maternal Uncle   . No Known Problems Paternal Aunt   . No Known Problems Paternal Uncle   . No Known Problems Maternal Grandmother   . No Known Problems Maternal Grandfather   . No Known Problems Paternal Grandmother   . No Known Problems Paternal Grandfather   . Diabetes Child   . Glaucoma Neg Hx   . Blindness Neg Hx   . Macular degeneration Neg Hx   . Retinal detachment Neg Hx   . Amblyopia Neg Hx   . Cataracts Neg Hx   . Hypertension Neg Hx   . Strabismus Neg Hx   . Stroke Neg Hx   . Thyroid disease Neg Hx   [6] Social History Tobacco Use  . Smoking status: Never  . Smokeless tobacco: Never  Vaping Use  . Vaping status: Never Used  Substance Use Topics  . Alcohol use: No  . Drug use: No

## 2024-08-01 DIAGNOSIS — H9319 Tinnitus, unspecified ear: Principal | ICD-10-CM

## 2024-08-04 ENCOUNTER — Ambulatory Visit: Admit: 2024-08-04 | Discharge: 2024-08-05 | Attending: Audiologist | Primary: Audiologist

## 2024-08-04 DIAGNOSIS — H9311 Tinnitus, right ear: Principal | ICD-10-CM

## 2024-08-04 DIAGNOSIS — H903 Sensorineural hearing loss, bilateral: Principal | ICD-10-CM

## 2024-08-04 DIAGNOSIS — Z95810 Presence of automatic (implantable) cardiac defibrillator: Principal | ICD-10-CM

## 2024-08-16 MED FILL — CINACALCET 90 MG TABLET: ORAL | 30 days supply | Qty: 30 | Fill #1

## 2024-08-16 MED FILL — CARVEDILOL 25 MG TABLET: ORAL | 90 days supply | Qty: 180 | Fill #1

## 2024-09-05 ENCOUNTER — Encounter: Payer: Self-pay | Admitting: Emergency Medicine

## 2024-09-05 ENCOUNTER — Emergency Department: Payer: Self-pay

## 2024-09-05 ENCOUNTER — Emergency Department
Admission: EM | Admit: 2024-09-05 | Discharge: 2024-09-05 | Disposition: A | Discharge status: Home / Self Care | Payer: Self-pay | Attending: Emergency Medicine | Admitting: Emergency Medicine

## 2024-09-05 ENCOUNTER — Other Ambulatory Visit: Payer: Self-pay

## 2024-09-05 DIAGNOSIS — J810 Acute pulmonary edema: Secondary | ICD-10-CM

## 2024-09-05 DIAGNOSIS — S4991XA Unspecified injury of right shoulder and upper arm, initial encounter: Secondary | ICD-10-CM

## 2024-09-05 DIAGNOSIS — S0990XA Unspecified injury of head, initial encounter: Secondary | ICD-10-CM

## 2024-09-05 DIAGNOSIS — R0602 Shortness of breath: Secondary | ICD-10-CM

## 2024-09-05 DIAGNOSIS — R42 Dizziness and giddiness: Secondary | ICD-10-CM

## 2024-09-05 DIAGNOSIS — N186 End stage renal disease: Secondary | ICD-10-CM

## 2024-09-05 DIAGNOSIS — S79912A Unspecified injury of left hip, initial encounter: Secondary | ICD-10-CM

## 2024-09-05 DIAGNOSIS — W19XXXA Unspecified fall, initial encounter: Secondary | ICD-10-CM

## 2024-09-05 LAB — COMPREHENSIVE METABOLIC PANEL WITH GFR
ALT: 12 U/L (ref 0–44)
AST: 20 U/L (ref 15–41)
Albumin: 4 g/dL (ref 3.5–5.0)
Alkaline Phosphatase: 183 U/L — ABNORMAL HIGH (ref 38–126)
Anion gap: 17 — ABNORMAL HIGH (ref 5–15)
BUN: 101 mg/dL — ABNORMAL HIGH (ref 8–23)
CO2: 23 mmol/L (ref 22–32)
Calcium: 8.9 mg/dL (ref 8.9–10.3)
Chloride: 92 mmol/L — ABNORMAL LOW (ref 98–111)
Creatinine, Ser: 9.17 mg/dL — ABNORMAL HIGH (ref 0.61–1.24)
GFR, Estimated: 6 mL/min — ABNORMAL LOW (ref >60)
Glucose, Bld: 104 mg/dL — ABNORMAL HIGH (ref 70–99)
Potassium: >7.5 mmol/L (ref 3.5–5.1)
Sodium: 133 mmol/L — ABNORMAL LOW (ref 135–145)
Total Bilirubin: 0.5 mg/dL (ref 0.0–1.2)
Total Protein: 7.6 g/dL (ref 6.5–8.1)

## 2024-09-05 LAB — CBC WITH DIFFERENTIAL/PLATELET
Abs Immature Granulocytes: 0.02 K/uL (ref 0.00–0.07)
Basophils Absolute: 0.1 K/uL (ref 0.0–0.1)
Basophils Relative: 1 %
Eosinophils Absolute: 0.1 K/uL (ref 0.0–0.5)
Eosinophils Relative: 3 %
HCT: 29.2 % — ABNORMAL LOW (ref 39.0–52.0)
Hemoglobin: 9.6 g/dL — ABNORMAL LOW (ref 13.0–17.0)
Immature Granulocytes: 0 %
Lymphocytes Relative: 12 %
Lymphs Abs: 0.6 K/uL — ABNORMAL LOW (ref 0.7–4.0)
MCH: 31.8 pg (ref 26.0–34.0)
MCHC: 32.9 g/dL (ref 30.0–36.0)
MCV: 96.7 fL (ref 80.0–100.0)
Monocytes Absolute: 0.5 K/uL (ref 0.1–1.0)
Monocytes Relative: 10 %
Neutro Abs: 3.6 K/uL (ref 1.7–7.7)
Neutrophils Relative %: 74 %
Platelets: 83 K/uL — ABNORMAL LOW (ref 150–400)
RBC: 3.02 MIL/uL — ABNORMAL LOW (ref 4.22–5.81)
RDW: 13.5 % (ref 11.5–15.5)
Smear Review: NORMAL
WBC: 4.9 K/uL (ref 4.0–10.5)
nRBC: 0 % (ref 0.0–0.2)

## 2024-09-05 LAB — PROTIME-INR
INR: 1.2 (ref 0.8–1.2)
Prothrombin Time: 16.2 s — ABNORMAL HIGH (ref 11.4–15.2)

## 2024-09-05 LAB — CBG MONITORING, ED: Glucose-Capillary: 116 mg/dL — ABNORMAL HIGH (ref 70–99)

## 2024-09-05 LAB — APTT: aPTT: 35 s (ref 24–36)

## 2024-09-05 LAB — BLOOD GAS, VENOUS
Acid-Base Excess: 2.5 mmol/L — ABNORMAL HIGH (ref 0.0–2.0)
Bicarbonate: 27.9 mmol/L (ref 20.0–28.0)
O2 Saturation: 77.3 %
Patient temperature: 37
pCO2, Ven: 45 mmHg (ref 44–60)
pH, Ven: 7.4 (ref 7.25–7.43)
pO2, Ven: 48 mmHg — ABNORMAL HIGH (ref 32–45)

## 2024-09-05 LAB — PRO BRAIN NATRIURETIC PEPTIDE: Pro Brain Natriuretic Peptide: >35000 pg/mL — ABNORMAL HIGH (ref <300.0)

## 2024-09-05 LAB — HEPATITIS B SURFACE ANTIGEN: Hepatitis B Surface Ag: NONREACTIVE

## 2024-09-05 LAB — TROPONIN T, HIGH SENSITIVITY
Troponin T High Sensitivity: 186 ng/L (ref 0–19)
Troponin T High Sensitivity: 187 ng/L (ref 0–19)

## 2024-09-05 LAB — MAGNESIUM: Magnesium: 3.6 mg/dL — ABNORMAL HIGH (ref 1.7–2.4)

## 2024-09-05 LAB — POTASSIUM: Potassium: >7.5 mmol/L (ref 3.5–5.1)

## 2024-09-05 MED ORDER — FENTANYL CITRATE (PF) 50 MCG/ML IJ SOSY
50.0000 ug | PREFILLED_SYRINGE | Freq: Once | INTRAMUSCULAR | Status: AC
  Administered 2024-09-05: 50 ug via INTRAVENOUS
  Filled 2024-09-05: qty 1

## 2024-09-05 MED ORDER — DEXTROSE 50 % IV SOLN: 25.0000 mL | Freq: Once | INTRAVENOUS | Status: DC

## 2024-09-05 MED ORDER — CHLORHEXIDINE GLUCONATE CLOTH 2 % EX PADS
6.0000 | MEDICATED_PAD | Freq: Every day | CUTANEOUS | Status: DC
  Filled 2024-09-05: qty 6

## 2024-09-05 MED ORDER — LIDOCAINE 5 % EX PTCH
2.0000 | MEDICATED_PATCH | CUTANEOUS | Status: DC
  Administered 2024-09-05: 2 via TRANSDERMAL
  Filled 2024-09-05: qty 2

## 2024-09-05 MED ORDER — CALCIUM GLUCONATE-NACL 1-0.675 GM/50ML-% IV SOLN: 1.0000 g | Freq: Once | INTRAVENOUS | Status: DC

## 2024-09-05 MED ORDER — PATIROMER SORBITEX CALCIUM 8.4 G PO PACK
16.8000 g | PACK | Freq: Every day | ORAL | Status: DC
  Filled 2024-09-05: qty 2

## 2024-09-05 MED ORDER — LIDOCAINE 5 % EX PTCH: 1.0000 | MEDICATED_PATCH | CUTANEOUS | 0 refills | Status: AC

## 2024-09-05 MED ORDER — INSULIN ASPART 100 UNIT/ML IJ SOLN: 10.0000 [IU] | Freq: Once | INTRAMUSCULAR | Status: DC

## 2024-09-05 MED ORDER — ACETAMINOPHEN 325 MG PO TABS
650.0000 mg | ORAL_TABLET | Freq: Once | ORAL | Status: AC
  Administered 2024-09-05: 650 mg via ORAL
  Filled 2024-09-05: qty 2

## 2024-09-05 NOTE — Discharge Instructions (Addendum)

## 2024-09-05 NOTE — ED Provider Notes (Signed)
 Notified by radiologist of trace subarachnoid hemorrhage.  Discussed with patient.  Neuroexam is unremarkable.  Will consult Dr. Claudene of neurosurgery     Dr. Claudene recommends repeat head CT in 6 hours   ----------------------------------------- 8:55 AM on 09/05/2024 -----------------------------------------  Nurse has notified me of critically elevated troponin of 187.  The patient has no chest pain, he does have end-stage renal disease, on review of records has had similarly elevated troponins in the past, will check second troponin, low concern for ACS   ----------------------------------------- 11:04 AM on 09/05/2024 ----------------------------------------- Notified of critical potassium of 7.5, will give Veltassa , insulin , IV dextrose , have notified nephrologist, patient will go for dialysis ASAP    CRITICAL CARE Performed by: Lamar Price   Total critical care time: 30 minutes  Critical care time was exclusive of separately billable procedures and treating other patients.  Critical care was necessary to treat or prevent imminent or life-threatening deterioration.  Critical care was time spent personally by me on the following activities: development of treatment plan with patient and/or surrogate as well as nursing, discussions with consultants, evaluation of patient's response to treatment, examination of patient, obtaining history from patient or surrogate, ordering and performing treatments and interventions, ordering and review of laboratory studies, ordering and review of radiographic studies, pulse oximetry and re-evaluation of patient's condition.    Price Lamar, MD 09/05/24 [phone]

## 2024-09-05 NOTE — ED Provider Notes (Signed)
 [facility] Provider Note    Event Date/Time   First MD Initiated Contact with Patient 09/05/24 [phone]     (approximate)   History   multiple complaints   HPI  [facility] Danny [name] is a 68 y.o. male   Past medical history of end-stage renal on dialysis, hypertension, diabetes, OSA on CPAP, has an implanted cardiac defibrillator, here with multiple complaints.  First, he is feeling fluid overloaded and distention in his abdomen, swelling in his face, orthopnea and exertional dyspnea after missing his dialysis session on Friday.  This was because he went to dialysis but he had to wait for over 1 hour, so decided to leave.  Over the course of the weekend he had progressive symptoms as above.  Over the weekend he also had a syncopal episode when he stood up felt lightheaded and fell, injuring his left hip and right shoulder.  He did strike his head.  He does not take blood thinners, only aspirin .  He reports no respiratory infectious symptoms.  Bowel movements have been normal, no GI bleeding.  He is anuric.  He denies chest pain.  Offered Spanish interpreter but patient refuses and instead is asking me to use his daughter at bedside as interpreter.  Independent Historian contributed to assessment above: Daughter at bedside corroborates information and past medical history as above  External Medical Documents Reviewed: Previous hospital notes for admission for pulmonary edema after missing hemodialysis.      Physical Exam   Triage Vital Signs: ED Triage Vitals  Encounter Vitals Group     BP 09/05/24 0619 (!) 161/79     Girls Systolic BP Percentile --      Girls Diastolic BP Percentile --      Boys Systolic BP Percentile --      Boys Diastolic BP Percentile --      Pulse Rate 09/05/24 0619 76     Resp 09/05/24 0619 18     Temp 09/05/24 0619 98 F (36.7 C)     Temp Source 09/05/24 0619 Oral     SpO2 09/05/24 0619 99 %     Weight 09/05/24 0618  194 lb (88 kg)     Height 09/05/24 0618 5' 9 (1.753 m)     Head Circumference --      Peak Flow --      Pain Score 09/05/24 0618 10     Pain Loc --      Pain Education --      Exclude from Growth Chart --     Most recent vital signs: Vitals:   09/05/24 0619  BP: (!) 161/79  Pulse: 76  Resp: 18  Temp: 98 F (36.7 C)  SpO2: 99%    General: Awake, no distress.  CV:  Good peripheral perfusion.  Resp:  Normal effort.  Abd:  No distention.  Other:  Well-appearing patient is slightly hypertensive otherwise vital signs normal no severe respiratory distress, oxygenation 99% on room air.  Crackles to midlung bases and mild peripheral edema to legs noted.  Both legs appear warm and well-perfused with good distal pulses.  Able to range in all extremities with full active range of motion though limited to the left hip due to pain.  Point tenderness to the deltoid on the right shoulder.  Able to range fully.  No obvious signs of head trauma.  Neck supple from radiation.  Benign abdominal exam.   ED Results / Procedures / Treatments  Labs (all labs ordered are listed, but only abnormal results are displayed) Labs Reviewed  COMPREHENSIVE METABOLIC PANEL WITH GFR  PRO BRAIN NATRIURETIC PEPTIDE  CBC WITH DIFFERENTIAL/PLATELET  BLOOD GAS, VENOUS  MAGNESIUM   HEPATITIS B SURFACE ANTIGEN  HEPATITIS B SURFACE ANTIBODY, QUANTITATIVE  TROPONIN T, HIGH SENSITIVITY     EKG  ED ECG REPORT I, Ginnie Shams, the attending physician, personally viewed and interpreted this ECG.   Date: 09/05/2024  EKG Time: 0618  Rate: 71  Rhythm: AV paced  Axis: nl  ST&T Change: No acute ischemic changes     PROCEDURES:  Critical Care performed: No  Procedures   MEDICATIONS ORDERED IN ED: Medications  Chlorhexidine  Gluconate Cloth 2 % PADS 6 each (has no administration in time range)    External physician / consultants:  I spoke with on-call nephrologist Dr. Saralee Stank regarding care plan  for this patient.   IMPRESSION / MDM / ASSESSMENT AND PLAN / ED COURSE  I reviewed the triage vital signs and the nursing notes.                                Patient's presentation is most consistent with acute presentation with potential threat to life or bodily function.  Differential diagnosis includes, but is not limited to, pulmonary edema due to missed dialysis, heart failure, presyncopal event due to vasovagal syncope, orthostatic syncope, dysrhythmia, traumatic injury including head or neck injury, shoulder or hip injury.   The patient is on the cardiac monitor to evaluate for evidence of arrhythmia and/or significant heart rate changes.  MDM:    In terms of his missed dialysis and now symptoms of fluid overload and pulmonary edema, shortness of breath, mild crackles on exam, he will need dialysis, will consult with nephrology to get it done while he is here.  Not emergent given no respiratory distress, will check labs to assess for electrolyte derangements.  No chest pain doubt cardiac ischemia.  Nonischemic EKG.  In terms of his lightheadedness upon standing presyncopal event that led to a fall we will assess imaging of head and neck right shoulder and affected left hip.  No focal neurologic deficits that suggest stroke.  Likely orthostatic.  Check basic labs, traumatic imaging as above and if no remarkable findings anticipate can be discharged after he completes his dialysis here with nephrology.       FINAL CLINICAL IMPRESSION(S) / ED DIAGNOSES   Final diagnoses:  ESRD (end stage renal disease) on dialysis (HCC)  Shortness of breath  Acute pulmonary edema (HCC)  Orthostatic lightheadedness  Fall, initial encounter  Traumatic injury of head, initial encounter  Hip injury, left, initial encounter  Right shoulder injury, initial encounter     Rx / DC Orders   ED Discharge Orders     None        Note:  This document was prepared using Dragon voice  recognition software and may include unintentional dictation errors.    Shams Ginnie, MD 09/05/24 [phone]

## 2024-09-05 NOTE — Progress Notes (Signed)
 [facility]  ROUNDING NOTE   Subjective:   Danny [name] is a 68 y.o. 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  Did miss treatment on Friday.  Patient is seen resting on stretcher, family at bedside.  Patient states he began experience shortness of breath yesterday.  Family used to obtain history due to patient drowsiness from recent fentanyl .  States besides Friday, patient has not missed any recent treatments.  She states patient has been complaining of groin pain.  Does report fall in bathroom last night.  Labs on ED arrival concerning for sodium 133, potassium greater than 7.5, BUN 101, creatinine 9.17 with GFR 6, anion gap 17, magnesium  3.6, troponin 187, and hemoglobin 9.6.  C-spine CT shows a subtle linear density compatible with trace subarachnoid hemorrhage without evidence of hematoma.  We have been consulted to manage dialysis needs during this admission.  Objective:  Vital signs in last 24 hours:  Temp:  [97.9 F (36.6 C)-98.2 F (36.8 C)] 97.9 F (36.6 C) (12/08 1535) Pulse Rate:  [51-76] 64 (12/08 1535) Resp:  [10-19] 11 (12/08 1535) BP: (111-172)/(50-94) 172/68 (12/08 1535) SpO2:  [86 %-100 %] 100 % (12/08 1535) Weight:  [88 kg-95 kg] 95 kg (12/08 1133)  Weight change:  Filed  Weights   09/05/24 0618 09/05/24 1133  Weight: 88 kg 95 kg    Intake/Output: No intake/output data recorded.   Intake/Output this shift:  Total I/O In: -  Out: 2500 [Other:2500]  Physical Exam: General: Ill-appearing  Head: Normocephalic, atraumatic. Moist oral mucosal membranes  Eyes: Anicteric  Lungs:  Clear to auscultation, normal effort  Heart: Regular rate and rhythm  Abdomen:  Soft, nontender  Extremities: Trace peripheral edema.  Neurologic: Somnolent  Skin: Warm,dry, no rash  Access: Left AVF    Basic Metabolic Panel: Recent Labs  Lab 09/05/24 0805 09/05/24 1200  NA 133*  --   K >7.5* >7.5*  CL 92*  --   CO2 23  --   GLUCOSE 104*  --   BUN 101*  --   CREATININE 9.17*  --   CALCIUM  8.9  --   MG 3.6*  --     Liver Function Tests: Recent Labs  Lab 09/05/24 0805  AST 20  ALT 12  ALKPHOS 183*  BILITOT 0.5  PROT 7.6  ALBUMIN 4.0   No results for input(s): LIPASE, AMYLASE in the last 168 hours. No results for input(s): AMMONIA in the last 168 hours.  CBC: Recent Labs  Lab 09/05/24 0805  WBC 4.9  NEUTROABS 3.6  HGB 9.6*  HCT 29.2*  MCV 96.7  PLT 83*    Cardiac Enzymes: No results for input(s): CKTOTAL, CKMB, CKMBINDEX, TROPONINI in the last 168 hours.  BNP: Invalid input(s): POCBNP  CBG: Recent Labs  Lab 09/05/24 0849  GLUCAP 116*    Microbiology: Results for orders placed or performed during the hospital encounter of 06/12/24  Resp panel by RT-PCR (RSV, Flu  A&B, Covid) Anterior Nasal Swab     Status: None   Collection Time: 06/12/24  4:59 PM   Specimen: Anterior Nasal Swab  Result Value Ref Range Status   SARS Coronavirus 2 by RT PCR NEGATIVE NEGATIVE Final    Comment: (NOTE) SARS-CoV-2 target nucleic acids are NOT DETECTED.  The SARS-CoV-2 RNA is generally detectable in upper respiratory specimens during the acute phase of infection. The lowest concentration of SARS-CoV-2 viral copies this assay can detect  is 138 copies/mL. A negative result does not preclude SARS-Cov-2 infection and should not be used as the sole basis for treatment or other patient management decisions. A negative result may occur with  improper specimen collection/handling, submission of specimen other than nasopharyngeal swab, presence of viral mutation(s) within the areas targeted by this assay, and inadequate number of viral copies(<138 copies/mL). A negative result must be combined with clinical observations, patient history, and epidemiological information. The expected result is Negative.  Fact Sheet for Patients:  bloggercourse.com  Fact Sheet for Healthcare Providers:  seriousbroker.it  This test is no t yet approved or cleared by the [place]  FDA and  has been authorized for detection and/or diagnosis of SARS-CoV-2 by FDA under an Emergency Use Authorization (EUA). This EUA will remain  in effect (meaning this test can be used) for the duration of the COVID-19 declaration under Section 564(b)(1) of the Act, 21 U.S.C.section 360bbb-3(b)(1), unless the authorization is terminated  or revoked sooner.       Influenza A by PCR NEGATIVE NEGATIVE Final   Influenza B by PCR NEGATIVE NEGATIVE Final    Comment: (NOTE) The Xpert Xpress SARS-CoV-2/FLU/RSV plus assay is intended as an aid in the diagnosis of influenza from Nasopharyngeal swab specimens and should not be used as a sole basis for treatment. Nasal washings and aspirates are unacceptable for Xpert Xpress SARS-CoV-2/FLU/RSV testing.  Fact Sheet for Patients: bloggercourse.com  Fact Sheet for Healthcare Providers: seriousbroker.it  This test is not yet approved or cleared by the [place]  FDA and has been authorized for detection and/or diagnosis of SARS-CoV-2 by FDA under an Emergency Use Authorization (EUA). This EUA will remain in effect  (meaning this test can be used) for the duration of the COVID-19 declaration under Section 564(b)(1) of the Act, 21 U.S.C. section 360bbb-3(b)(1), unless the authorization is terminated or revoked.     Resp Syncytial Virus by PCR NEGATIVE NEGATIVE Final    Comment: (NOTE) Fact Sheet for Patients: bloggercourse.com  Fact Sheet for Healthcare Providers: seriousbroker.it  This test is not yet approved or cleared by the [place]  FDA and has been authorized for detection and/or diagnosis of SARS-CoV-2 by FDA under an Emergency Use Authorization (EUA). This EUA will remain in effect (meaning this test can be used) for the duration of the COVID-19 declaration under Section 564(b)(1) of the Act, 21 U.S.C. section 360bbb-3(b)(1), unless the authorization is terminated or revoked.  Performed at [facility], [address] [place], [place] [address]     Coagulation Studies: Recent Labs    09/05/24 0805  LABPROT 16.2*  INR 1.2    Urinalysis: No results for input(s): COLORURINE, LABSPEC, PHURINE, GLUCOSEU, HGBUR, BILIRUBINUR, KETONESUR, PROTEINUR, UROBILINOGEN, NITRITE, LEUKOCYTESUR in the last 72 hours.  Invalid input(s): APPERANCEUR    Imaging: DG Hip Unilat W or Wo Pelvis 2-3 Views Left Result Date: 09/05/2024 CLINICAL DATA:  Fall.  Left hip pain. EXAM: DG HIP (WITH OR WITHOUT PELVIS) 3V LEFT COMPARISON:  08/11/2022 FINDINGS: There is no  evidence of hip fracture or dislocation. Mild left hip osteoarthritis seen with geode noted in acetabular roof. Extensive pelvic and peripheral vascular calcification also seen. IMPRESSION: No acute findings. Mild left hip osteoarthritis. Electronically Signed   By: Norleen DELENA Kil M.D.   On: 09/05/2024 08:16   DG Chest Port 1 View Result Date: 09/05/2024 CLINICAL DATA:  Fall.  Chest pain. EXAM: PORTABLE CHEST 1 VIEW COMPARISON:  06/12/2024 FINDINGS:  Cardiomegaly stable. AICD remains in place. No pneumothorax or hemothorax visualized. Both lungs are clear. IMPRESSION: Stable cardiomegaly. No active lung disease. Electronically Signed   By: Norleen DELENA Kil M.D.   On: 09/05/2024 08:15   DG Shoulder Right Result Date: 09/05/2024 CLINICAL DATA:  Fall.  Right shoulder injury and pain. EXAM: RIGHT SHOULDER - 3 VIEW COMPARISON:  None Available. FINDINGS: There is no evidence of fracture or dislocation. Mild degenerative spurring of AC joint and undersurface of acromion noted. Generalized osteopenia. IMPRESSION: No acute findings. Mild degenerative spurring of AC joint and undersurface of acromion. Electronically Signed   By: Norleen DELENA Kil M.D.   On: 09/05/2024 08:14   CT Head Wo Contrast Result Date: 09/05/2024 CLINICAL DATA:  Patient fell last night and struck head. EXAM: CT HEAD WITHOUT CONTRAST CT CERVICAL SPINE WITHOUT CONTRAST TECHNIQUE: Multidetector CT imaging of the head and cervical spine was performed following the standard protocol without intravenous contrast. Multiplanar CT image reconstructions of the cervical spine were also generated. RADIATION DOSE REDUCTION: This exam was performed according to the departmental dose-optimization program which includes automated exposure control, adjustment of the mA and/or kV according to patient size and/or use of iterative reconstruction technique. COMPARISON:  Head CT 08/11/2022.  Cervical spine 01/30/2022. FINDINGS: CT HEAD FINDINGS Brain: There is no evidence for acute hemorrhage, hydrocephalus, mass lesion, or abnormal extra-axial fluid collection. No definite CT evidence for acute infarction. Subtle linear increased density in the deep right sylvian fissure suggests trace subarachnoid hemorrhage (axial 16/2 and coronal 44/4. No evidence for subdural hematoma. No midline shift or substantial mass effect. No hydrocephalus. Diffuse loss of parenchymal volume is consistent with atrophy. Patchy low attenuation in  the deep hemispheric and periventricular white matter is nonspecific, but likely reflects chronic microvascular ischemic demyelination. Vascular: No hyperdense vessel or unexpected calcification. Skull: No evidence for fracture. No worrisome lytic or sclerotic lesion. Sinuses/Orbits: Similar effusion posterior right mastoid air cells. Visualized portions of the globes and intraorbital fat are unremarkable. Other: None. CT CERVICAL SPINE FINDINGS Study limited by incomplete visualization of the C7-T1 level. Alignment: Straightening of normal cervical lordosis without evidence for traumatic subluxation. Skull base and vertebrae: No acute fracture. No primary bone lesion or focal pathologic process. Soft tissues and spinal canal: No prevertebral fluid or swelling. No visible canal hematoma. Disc levels:  Preserved. Upper chest: N/A Other: None. IMPRESSION: 1. Subtle linear increased density in the deep right Sylvian fissure compatible with trace subarachnoid hemorrhage. No evidence for subdural hematoma. No midline shift or substantial mass effect. 2. Atrophy with chronic small vessel ischemic disease. 3. No evidence for cervical spine fracture or traumatic subluxation. Study limited by incomplete visualization of the C7-T1 level. Critical Value/emergent results were called by telephone at the time of interpretation on 09/05/2024 at 7:39 am to provider Dr. Arlander, who verbally acknowledged these results. Electronically Signed   By: Camellia Candle M.D.   On: 09/05/2024 07:40   CT Cervical Spine Wo Contrast Result Date: 09/05/2024 CLINICAL DATA:  Patient fell last night and struck head. EXAM: CT HEAD WITHOUT  CONTRAST CT CERVICAL SPINE WITHOUT CONTRAST TECHNIQUE: Multidetector CT imaging of the head and cervical spine was performed following the standard protocol without intravenous contrast. Multiplanar CT image reconstructions of the cervical spine were also generated. RADIATION DOSE REDUCTION: This exam was performed  according to the departmental dose-optimization program which includes automated exposure control, adjustment of the mA and/or kV according to patient size and/or use of iterative reconstruction technique. COMPARISON:  Head CT 08/11/2022.  Cervical spine 01/30/2022. FINDINGS: CT HEAD FINDINGS Brain: There is no evidence for acute hemorrhage, hydrocephalus, mass lesion, or abnormal extra-axial fluid collection. No definite CT evidence for acute infarction. Subtle linear increased density in the deep right sylvian fissure suggests trace subarachnoid hemorrhage (axial 16/2 and coronal 44/4. No evidence for subdural hematoma. No midline shift or substantial mass effect. No hydrocephalus. Diffuse loss of parenchymal volume is consistent with atrophy. Patchy low attenuation in the deep hemispheric and periventricular white matter is nonspecific, but likely reflects chronic microvascular ischemic demyelination. Vascular: No hyperdense vessel or unexpected calcification. Skull: No evidence for fracture. No worrisome lytic or sclerotic lesion. Sinuses/Orbits: Similar effusion posterior right mastoid air cells. Visualized portions of the globes and intraorbital fat are unremarkable. Other: None. CT CERVICAL SPINE FINDINGS Study limited by incomplete visualization of the C7-T1 level. Alignment: Straightening of normal cervical lordosis without evidence for traumatic subluxation. Skull base and vertebrae: No acute fracture. No primary bone lesion or focal pathologic process. Soft tissues and spinal canal: No prevertebral fluid or swelling. No visible canal hematoma. Disc levels:  Preserved. Upper chest: N/A Other: None. IMPRESSION: 1. Subtle linear increased density in the deep right Sylvian fissure compatible with trace subarachnoid hemorrhage. No evidence for subdural hematoma. No midline shift or substantial mass effect. 2. Atrophy with chronic small vessel ischemic disease. 3. No evidence for cervical spine fracture or  traumatic subluxation. Study limited by incomplete visualization of the C7-T1 level. Critical Value/emergent results were called by telephone at the time of interpretation on 09/05/2024 at 7:39 am to provider Dr. Arlander, who verbally acknowledged these results. Electronically Signed   By: Camellia Candle M.D.   On: 09/05/2024 07:40     Medications:    calcium  gluconate      Chlorhexidine  Gluconate Cloth  6 each Topical Q0600   dextrose   25 mL Intravenous Once   insulin  aspart  10 Units Intravenous Once   patiromer   16.8 g Oral Daily     Assessment/ Plan:  [name] is a 68 y.o.  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  Questionably missed 1-2 treatments in the past week.  Potassium greater than 7.5.  Patient will receive urgent dialysis on 1K bath to manage.  Will attempt fluid removal of 2 to 2.5 L.  Will assess need for additional treatment tomorrow, if remains inpatient.  2.  Acute respiratory failure, complains of shortness of breath on ED arrival.  Remains on room air.  Mild generalized edema noted.  Will attempt fluid removal with dialysis.  3. Anemia of chronic  kidney disease Lab Results  Component Value Date   HGB 9.6 (L) 09/05/2024    Hemoglobin acceptable for renal patient.  Will hold ESA's at this time.  4.  Subarachnoid hemorrhage, confirmed on  CT cervical spine.  Recent fall last night.  Neurosurgery consulted.  We will rescan this evening.   LOS: 0 Bertel Venard 12/8/20254:34 PM

## 2024-09-05 NOTE — Progress Notes (Signed)
   09/05/24 1535  Vitals  Temp 97.9 F (36.6 C)  Temp Source Oral  BP (!) 172/68  MAP (mmHg) 100  BP Location Right Arm  BP Method Automatic  Patient Position (if appropriate) Lying  Pulse Rate 64  Pulse Rate Source Monitor  ECG Heart Rate 64  Resp 11  Oxygen Therapy  SpO2 100 %  During Treatment Monitoring  Blood Flow Rate (mL/min) 0 mL/min  Arterial Pressure (mmHg) 33.13 mmHg  Venous Pressure (mmHg) -37.57 mmHg  TMP (mmHg) 16.97 mmHg  Ultrafiltration Rate (mL/min) 881 mL/min  Dialysate Flow Rate (mL/min) 299 ml/min  Dialysate Potassium Concentration 2  Dialysate Calcium  Concentration 2.5  Duration of HD Treatment -hour(s) 3.5 hour(s)  Cumulative Fluid Removed (mL) per Treatment  2500.13  HD Safety Checks Performed Yes  Intra-Hemodialysis Comments Progressing as prescribed  Dialysis Fluid Bolus Normal Saline  Post Treatment  Dialyzer Clearance Clear  Hemodialysis Intake (mL) 0 mL  Liters Processed 84  Fluid Removed (mL) 2500 mL  Tolerated HD Treatment Yes  Post-Hemodialysis Comments 2500 removed successfully. Tx has been tolerated. Vs have remained stable.Access is positive for thrill and bruit. No prolonged bleeding. Patient has no changes to baseline. Report called to ER STAFF

## 2024-09-05 NOTE — ED Triage Notes (Signed)
 Pt presents stating he missed dialysis on Friday bc they waited for an hour and had not been seen yet, normal schedule is M/W/F, does not feel well enough to go today, pt further endorses bilateral groin pain, feels faint, fell in the bathroom last night, reports hitting his head, denies LOC, pt unsure if he is on thinners

## 2024-09-05 NOTE — ED Provider Notes (Signed)
 Patient returns from dialysis.  Awaiting repeat CT scan of the head  Clinical Course as of 09/05/24 2349  Mon Sep 05, 2024  1803 Patient alert and oriented x 3 taking p.o. feels improved.  Repeat CT head read pending.  Daughter at bedside.  All in agreement that he can likely return home if CT head not worse [HD]  1815 CT head demonstrates stability of subarachnoid [HD]  1816 Return precautions discussed with patient and daughter and all voiced understanding and feel comfortable returning home.  Patient was counseled not to miss further dialysis sessions [HD]    Clinical Course User Index [HD] Nicholaus Rolland BRAVO, MD   I did send the patient home with scripts for lidocaine  patches given his leg pain and shoulder pain.   At time of discharge there is no evidence of acute life, limb, vision, or fertility threat. Patient has stable vital signs, pain is well controlled, patient is ambulatory and p.o. tolerant.  Discharge instructions were completed using the EPIC system. I would refer you to those at this time. All warnings prescriptions follow-up etc. were discussed in detail with the patient. Patient indicates understanding and is agreeable with this plan. All questions answered.  Patient is made aware that they may return to the emergency department for any worsening or new condition or for any other emergency.     Nicholaus Rolland BRAVO, MD 09/05/24 [phone]

## 2024-09-07 ENCOUNTER — Ambulatory Visit: Admit: 2024-09-07 | Discharge: 2024-09-07

## 2024-09-07 ENCOUNTER — Encounter: Admit: 2024-09-07 | Discharge: 2024-09-07 | Attending: Internal Medicine | Primary: Internal Medicine

## 2024-09-07 ENCOUNTER — Inpatient Hospital Stay: Admit: 2024-09-07 | Discharge: 2024-09-07

## 2024-09-09 ENCOUNTER — Encounter: Admit: 2024-09-09 | Discharge: 2024-09-09 | Attending: Internal Medicine | Primary: Internal Medicine

## 2024-09-11 ENCOUNTER — Emergency Department (HOSPITAL_COMMUNITY): Payer: MEDICAID

## 2024-09-11 ENCOUNTER — Inpatient Hospital Stay (HOSPITAL_COMMUNITY)
Admission: EM | Admit: 2024-09-11 | Discharge: 2024-09-12 | DRG: 640 | Disposition: A | Discharge status: Home Health | Payer: MEDICAID | Attending: Family Medicine | Admitting: Family Medicine

## 2024-09-11 ENCOUNTER — Other Ambulatory Visit: Payer: Self-pay

## 2024-09-11 ENCOUNTER — Encounter (HOSPITAL_COMMUNITY): Payer: Self-pay

## 2024-09-11 DIAGNOSIS — E1129 Type 2 diabetes mellitus with other diabetic kidney complication: Secondary | ICD-10-CM | POA: Diagnosis present

## 2024-09-11 DIAGNOSIS — I5022 Chronic systolic (congestive) heart failure: Secondary | ICD-10-CM | POA: Diagnosis present

## 2024-09-11 DIAGNOSIS — W19XXXA Unspecified fall, initial encounter: Principal | ICD-10-CM

## 2024-09-11 DIAGNOSIS — D649 Anemia, unspecified: Secondary | ICD-10-CM | POA: Diagnosis present

## 2024-09-11 DIAGNOSIS — S0083XA Contusion of other part of head, initial encounter: Secondary | ICD-10-CM

## 2024-09-11 LAB — CBC
HCT: 27.9 % — ABNORMAL LOW (ref 39.0–52.0)
Hemoglobin: 9 g/dL — ABNORMAL LOW (ref 13.0–17.0)
MCH: 31.6 pg (ref 26.0–34.0)
MCHC: 32.3 g/dL (ref 30.0–36.0)
MCV: 97.9 fL (ref 80.0–100.0)
Platelets: 91 K/uL — ABNORMAL LOW (ref 150–400)
RBC: 2.85 MIL/uL — ABNORMAL LOW (ref 4.22–5.81)
RDW: 13.7 % (ref 11.5–15.5)
WBC: 5.6 K/uL (ref 4.0–10.5)
nRBC: 0 % (ref 0.0–0.2)

## 2024-09-11 LAB — COMPREHENSIVE METABOLIC PANEL WITH GFR
ALT: 17 U/L (ref 0–44)
AST: 17 U/L (ref 15–41)
Albumin: 3.6 g/dL (ref 3.5–5.0)
Alkaline Phosphatase: 134 U/L — ABNORMAL HIGH (ref 38–126)
Anion gap: 14 (ref 5–15)
BUN: 82 mg/dL — ABNORMAL HIGH (ref 8–23)
CO2: 25 mmol/L (ref 22–32)
Calcium: 7.7 mg/dL — ABNORMAL LOW (ref 8.9–10.3)
Chloride: 92 mmol/L — ABNORMAL LOW (ref 98–111)
Creatinine, Ser: 9.04 mg/dL — ABNORMAL HIGH (ref 0.61–1.24)
GFR, Estimated: 6 mL/min — ABNORMAL LOW (ref >60)
Glucose, Bld: 109 mg/dL — ABNORMAL HIGH (ref 70–99)
Potassium: >7.5 mmol/L (ref 3.5–5.1)
Sodium: 131 mmol/L — ABNORMAL LOW (ref 135–145)
Total Bilirubin: 1 mg/dL (ref 0.0–1.2)
Total Protein: 7.3 g/dL (ref 6.5–8.1)

## 2024-09-11 LAB — I-STAT CHEM 8, ED
BUN: 84 mg/dL — ABNORMAL HIGH (ref 8–23)
BUN: 86 mg/dL — ABNORMAL HIGH (ref 8–23)
Calcium, Ion: 0.87 mmol/L — CL (ref 1.15–1.40)
Calcium, Ion: 0.88 mmol/L — CL (ref 1.15–1.40)
Chloride: 97 mmol/L — ABNORMAL LOW (ref 98–111)
Chloride: 98 mmol/L (ref 98–111)
Creatinine, Ser: 9.3 mg/dL — ABNORMAL HIGH (ref 0.61–1.24)
Creatinine, Ser: 9.5 mg/dL — ABNORMAL HIGH (ref 0.61–1.24)
Glucose, Bld: 110 mg/dL — ABNORMAL HIGH (ref 70–99)
Glucose, Bld: 110 mg/dL — ABNORMAL HIGH (ref 70–99)
HCT: 28 % — ABNORMAL LOW (ref 39.0–52.0)
HCT: 29 % — ABNORMAL LOW (ref 39.0–52.0)
Hemoglobin: 9.5 g/dL — ABNORMAL LOW (ref 13.0–17.0)
Hemoglobin: 9.9 g/dL — ABNORMAL LOW (ref 13.0–17.0)
Potassium: 7.5 mmol/L (ref 3.5–5.1)
Potassium: 7.5 mmol/L (ref 3.5–5.1)
Sodium: 129 mmol/L — ABNORMAL LOW (ref 135–145)
Sodium: 129 mmol/L — ABNORMAL LOW (ref 135–145)
TCO2: 22 mmol/L (ref 22–32)
TCO2: 23 mmol/L (ref 22–32)

## 2024-09-11 LAB — SAMPLE TO BLOOD BANK

## 2024-09-11 LAB — PROTIME-INR
INR: 1.4 — ABNORMAL HIGH (ref 0.8–1.2)
Prothrombin Time: 17.5 s — ABNORMAL HIGH (ref 11.4–15.2)

## 2024-09-11 LAB — ETHANOL: Alcohol, Ethyl (B): <15 mg/dL (ref <15)

## 2024-09-11 LAB — MAGNESIUM: Magnesium: 3.1 mg/dL — ABNORMAL HIGH (ref 1.7–2.4)

## 2024-09-11 LAB — I-STAT CG4 LACTIC ACID, ED: Lactic Acid, Venous: 0.6 mmol/L (ref 0.5–1.9)

## 2024-09-11 LAB — TROPONIN I (HIGH SENSITIVITY): Troponin I (High Sensitivity): 36 ng/L — ABNORMAL HIGH (ref <18)

## 2024-09-11 MED ORDER — CALCIUM GLUCONATE-NACL 1-0.675 GM/50ML-% IV SOLN
1.0000 g | Freq: Once | INTRAVENOUS | Status: AC
  Administered 2024-09-12: 1000 mg via INTRAVENOUS
  Filled 2024-09-11: qty 50

## 2024-09-11 MED ORDER — ALBUTEROL SULFATE (2.5 MG/3ML) 0.083% IN NEBU
10.0000 mg | INHALATION_SOLUTION | Freq: Once | RESPIRATORY_TRACT | Status: AC
  Administered 2024-09-11: 10 mg via RESPIRATORY_TRACT
  Filled 2024-09-11: qty 12

## 2024-09-11 MED ORDER — SODIUM ZIRCONIUM CYCLOSILICATE 10 G PO PACK
10.0000 g | PACK | Freq: Once | ORAL | Status: AC
  Administered 2024-09-12: 10 g via ORAL
  Filled 2024-09-11: qty 1

## 2024-09-11 MED ORDER — INSULIN ASPART 100 UNIT/ML IV SOLN
5.0000 [IU] | Freq: Once | INTRAVENOUS | Status: AC
  Administered 2024-09-12: 5 [IU] via INTRAVENOUS
  Filled 2024-09-11: qty 5

## 2024-09-11 MED ORDER — IOHEXOL 350 MG/ML SOLN
75.0000 mL | Freq: Once | INTRAVENOUS | Status: AC | PRN
  Administered 2024-09-11: 75 mL via INTRAVENOUS

## 2024-09-11 MED ORDER — DEXTROSE 50 % IV SOLN
1.0000 | Freq: Once | INTRAVENOUS | Status: AC
  Administered 2024-09-12: 50 mL via INTRAVENOUS
  Filled 2024-09-11: qty 50

## 2024-09-11 NOTE — ED Provider Notes (Incomplete)
 [place] EMERGENCY DEPARTMENT AT [facility] Provider Note   CSN: [id number] Arrival date & time: 09/11/24  2259     Patient presents with: [facility] Danny [name] is a 68 y.o. male.  {Add pertinent medical, surgical, social history, OB history to YEP:67052} Patient brought to the emergency department after a fall down a flight of steps.  Patient with bruising to the face and head, complains of right knee pain.  There is confusion as to whether or not he is on blood thinners.       Prior to Admission medications  Medication Sig Start Date End Date Taking? Authorizing Provider  amLODipine  (NORVASC ) 5 MG tablet Take 1 tablet (5 mg total) by mouth at bedtime. 05/28/24   Jens Durand, MD  aspirin  EC 81 MG tablet Take 1 tablet (81 mg total) by mouth daily. Swallow whole. 05/29/24   Jens Durand, MD  atorvastatin  (LIPITOR) 20 MG tablet Take 20 mg by mouth daily. 12/29/23 12/28/24  [provider]  calcium  acetate (PHOSLO ) 667 MG capsule Take 2,001 mg by mouth 3 (three) times daily with meals.    [provider]  carvedilol  (COREG ) 25 MG tablet Take 25 mg by mouth 2 (two) times daily with a meal.    [provider]  Cholecalciferol (VITAMIN D-3) 125 MCG (5000 UT) TABS Take 5,000 Units by mouth daily.    [provider]  cinacalcet  (SENSIPAR ) 60 MG tablet Take 60 mg by mouth daily. 04/10/20   [provider]  guaiFENesin -dextromethorphan  (ROBITUSSIN DM) 100-10 MG/5ML syrup Take 5 mLs by mouth every 8 (eight) hours as needed for cough. 05/28/24   Jens Durand, MD  insulin  aspart protamine- aspart (NOVOLOG  MIX 70/30) (70-30) 100 UNIT/ML injection Inject 0.05-0.1 mLs (5-10 Units total) into the skin See admin instructions. 5 units every morning and 4 units every evening Patient taking differently: Inject 3-8 Units into the skin See admin instructions. 8 units every morning and 3 units every evening 02/21/21   Wouk, Devaughn Sayres, MD   lidocaine  (LIDODERM ) 5 % Place 1 patch onto the skin daily for 10 days. Remove & Discard patch within 12 hours or as directed by MD 09/05/24 09/15/24  Nicholaus Rolland BRAVO, MD  losartan  (COZAAR ) 50 MG tablet Take 50 mg by mouth daily. 12/29/23 12/28/24  [provider]  sevelamer  carbonate (RENVELA ) 800 MG tablet Take 800-3,200 mg by mouth See admin instructions. Take 4 tablets (3200mg ) by mouth three times daily with meals    [provider]    Allergies: Patient has no known allergies.    Review of Systems  Updated Vital Signs BP 118/70   Pulse 83   Temp 98.6 F (37 C) (Oral)   Resp 15   Ht 5' 9 (1.753 m)   Wt 95 kg   SpO2 100%   BMI 30.93 kg/m   Physical Exam Vitals and nursing note reviewed.  Constitutional:      General: He is not in acute distress.    Appearance: He is well-developed.  HENT:     Head: Normocephalic. Contusion present.     Mouth/Throat:     Mouth: Mucous membranes are moist.  Eyes:     General: Vision grossly intact. Gaze aligned appropriately.     Extraocular Movements: Extraocular movements intact.     Conjunctiva/sclera: Conjunctivae normal.  Cardiovascular:     Rate and Rhythm: Normal rate and regular rhythm.     Pulses: Normal pulses.  Heart sounds: Normal heart sounds, S1 normal and S2 normal. No murmur heard.    No friction rub. No gallop.  Pulmonary:     Effort: Pulmonary effort is normal. No respiratory distress.     Breath sounds: Normal breath sounds.  Abdominal:     Palpations: Abdomen is soft.     Tenderness: There is no abdominal tenderness. There is no guarding or rebound.     Hernia: No hernia is present.  Musculoskeletal:        General: No swelling.     Cervical back: Full passive range of motion without pain, normal range of motion and neck supple. No pain with movement, spinous process tenderness or muscular tenderness. Normal range of motion.     Right lower leg: No edema.     Left lower leg: No edema.   Skin:    General: Skin is warm and dry.     Capillary Refill: Capillary refill takes less than 2 seconds.     Findings: No ecchymosis, erythema, lesion or wound.  Neurological:     Mental Status: He is alert and oriented to person, place, and time.     GCS: GCS eye subscore is 4. GCS verbal subscore is 5. GCS motor subscore is 6.     Cranial Nerves: Cranial nerves 2-12 are intact.     Sensory: Sensation is intact.     Motor: Motor function is intact. No weakness or abnormal muscle tone.     Coordination: Coordination is intact.  Psychiatric:        Mood and Affect: Mood normal.        Speech: Speech normal.        Behavior: Behavior normal.     (all labs ordered are listed, but only abnormal results are displayed) Labs Reviewed  I-STAT CHEM 8, ED - Abnormal; Notable for the following components:      Result Value   Sodium 129 (*)    Potassium 7.5 (*)    Chloride 97 (*)    BUN 86 (*)    Creatinine, Ser 9.30 (*)    Glucose, Bld 110 (*)    Calcium , Ion 0.88 (*)    Hemoglobin 9.9 (*)    HCT 29.0 (*)    All other components within normal limits  I-STAT CHEM 8, ED - Abnormal; Notable for the following components:   Sodium 129 (*)    Potassium 7.5 (*)    BUN 84 (*)    Creatinine, Ser 9.50 (*)    Glucose, Bld 110 (*)    Calcium , Ion 0.87 (*)    Hemoglobin 9.5 (*)    HCT 28.0 (*)    All other components within normal limits  COMPREHENSIVE METABOLIC PANEL WITH GFR  CBC  ETHANOL  PROTIME-INR  MAGNESIUM   I-STAT CG4 LACTIC ACID, ED  SAMPLE TO BLOOD BANK  TROPONIN I (HIGH SENSITIVITY)    EKG: None  Radiology: No results found.  {Document cardiac monitor, telemetry assessment procedure when appropriate:32947} Procedures   Medications Ordered in the ED - No data to display    {Click here for ABCD2, HEART and other calculators REFRESH Note before signing:1}                              Medical Decision Making Amount and/or Complexity of Data Reviewed Labs:  ordered. Radiology: ordered.   ***  {Document critical care time when appropriate  Document review of labs  and clinical decision tools ie CHADS2VASC2, etc  Document your independent review of radiology images and any outside records  Document your discussion with family members, caretakers and with consultants  Document social determinants of health affecting pt's care  Document your decision making why or why not admission, treatments were needed:32947:::1}   Final diagnoses:  None    ED Discharge Orders     None

## 2024-09-11 NOTE — ED Triage Notes (Signed)
 Pt coming from home via ems for evaluation of 10/10 chest pain and headache and 4/10 right knee pain following a fall down approximately 15-16 steps. Fall occurred 30 minutes pta and pt presents with a c-collar in place by ems. Pt presents with 4 skin tears to the right arm, abrasions to the right knee, and bruising to the right side of the chest. Pt also has dialysis MWF and missed Friday

## 2024-09-12 ENCOUNTER — Inpatient Hospital Stay (HOSPITAL_COMMUNITY): Payer: MEDICAID

## 2024-09-12 ENCOUNTER — Encounter (HOSPITAL_COMMUNITY): Payer: Self-pay | Admitting: Family Medicine

## 2024-09-12 DIAGNOSIS — E1122 Type 2 diabetes mellitus with diabetic chronic kidney disease: Secondary | ICD-10-CM

## 2024-09-12 DIAGNOSIS — N2889 Other specified disorders of kidney and ureter: Secondary | ICD-10-CM | POA: Diagnosis present

## 2024-09-12 DIAGNOSIS — E875 Hyperkalemia: Secondary | ICD-10-CM | POA: Diagnosis present

## 2024-09-12 DIAGNOSIS — N186 End stage renal disease: Secondary | ICD-10-CM

## 2024-09-12 DIAGNOSIS — S2231XA Fracture of one rib, right side, initial encounter for closed fracture: Secondary | ICD-10-CM

## 2024-09-12 DIAGNOSIS — S2239XA Fracture of one rib, unspecified side, initial encounter for closed fracture: Secondary | ICD-10-CM | POA: Diagnosis present

## 2024-09-12 DIAGNOSIS — D696 Thrombocytopenia, unspecified: Secondary | ICD-10-CM | POA: Diagnosis present

## 2024-09-12 DIAGNOSIS — Z992 Dependence on renal dialysis: Secondary | ICD-10-CM

## 2024-09-12 DIAGNOSIS — R5381 Other malaise: Secondary | ICD-10-CM | POA: Diagnosis present

## 2024-09-12 DIAGNOSIS — Z794 Long term (current) use of insulin: Secondary | ICD-10-CM

## 2024-09-12 DIAGNOSIS — G4733 Obstructive sleep apnea (adult) (pediatric): Secondary | ICD-10-CM

## 2024-09-12 LAB — CBC
HCT: 26.2 % — ABNORMAL LOW (ref 39.0–52.0)
Hemoglobin: 8.4 g/dL — ABNORMAL LOW (ref 13.0–17.0)
MCH: 31 pg (ref 26.0–34.0)
MCHC: 32.1 g/dL (ref 30.0–36.0)
MCV: 96.7 fL (ref 80.0–100.0)
Platelets: 79 K/uL — ABNORMAL LOW (ref 150–400)
RBC: 2.71 MIL/uL — ABNORMAL LOW (ref 4.22–5.81)
RDW: 13.8 % (ref 11.5–15.5)
WBC: 5.2 K/uL (ref 4.0–10.5)
nRBC: 0 % (ref 0.0–0.2)

## 2024-09-12 LAB — CBG MONITORING, ED
Glucose-Capillary: 104 mg/dL — ABNORMAL HIGH (ref 70–99)
Glucose-Capillary: 117 mg/dL — ABNORMAL HIGH (ref 70–99)
Glucose-Capillary: 68 mg/dL — ABNORMAL LOW (ref 70–99)
Glucose-Capillary: 79 mg/dL (ref 70–99)

## 2024-09-12 LAB — BASIC METABOLIC PANEL WITH GFR
Anion gap: 15 (ref 5–15)
BUN: 82 mg/dL — ABNORMAL HIGH (ref 8–23)
CO2: 24 mmol/L (ref 22–32)
Calcium: 7.5 mg/dL — ABNORMAL LOW (ref 8.9–10.3)
Chloride: 92 mmol/L — ABNORMAL LOW (ref 98–111)
Creatinine, Ser: 9.13 mg/dL — ABNORMAL HIGH (ref 0.61–1.24)
GFR, Estimated: 6 mL/min — ABNORMAL LOW (ref >60)
Glucose, Bld: 124 mg/dL — ABNORMAL HIGH (ref 70–99)
Potassium: 6.6 mmol/L (ref 3.5–5.1)
Sodium: 131 mmol/L — ABNORMAL LOW (ref 135–145)

## 2024-09-12 LAB — TROPONIN I (HIGH SENSITIVITY): Troponin I (High Sensitivity): 32 ng/L — ABNORMAL HIGH (ref <18)

## 2024-09-12 LAB — POTASSIUM: Potassium: 4.3 mmol/L (ref 3.5–5.1)

## 2024-09-12 MED ORDER — AMLODIPINE BESYLATE 5 MG PO TABS: 5.0000 mg | ORAL_TABLET | Freq: Every day | ORAL | Status: DC

## 2024-09-12 MED ORDER — SENNA 8.6 MG PO TABS: 1.0000 | ORAL_TABLET | Freq: Every day | ORAL | Status: DC | PRN

## 2024-09-12 MED ORDER — METHOCARBAMOL 500 MG PO TABS: 500.0000 mg | ORAL_TABLET | Freq: Four times a day (QID) | ORAL | Status: DC | PRN

## 2024-09-12 MED ORDER — INSULIN ASPART 100 UNIT/ML IJ SOLN: 0.0000 [IU] | Freq: Every day | INTRAMUSCULAR | Status: DC

## 2024-09-12 MED ORDER — ATORVASTATIN CALCIUM 10 MG PO TABS
20.0000 mg | ORAL_TABLET | Freq: Every day | ORAL | Status: DC
  Administered 2024-09-12: 10:00:00 20 mg via ORAL
  Filled 2024-09-12: qty 2

## 2024-09-12 MED ORDER — ACETAMINOPHEN 650 MG RE SUPP: 650.0000 mg | Freq: Four times a day (QID) | RECTAL | Status: DC | PRN

## 2024-09-12 MED ORDER — INSULIN ASPART 100 UNIT/ML IJ SOLN: 0.0000 [IU] | Freq: Three times a day (TID) | INTRAMUSCULAR | Status: DC

## 2024-09-12 MED ORDER — SEVELAMER CARBONATE 800 MG PO TABS
3200.0000 mg | ORAL_TABLET | Freq: Three times a day (TID) | ORAL | Status: DC
  Administered 2024-09-12: 10:00:00 3200 mg via ORAL
  Filled 2024-09-12: qty 4

## 2024-09-12 MED ORDER — HEPARIN SODIUM (PORCINE) 1000 UNIT/ML IJ SOLN
INTRAMUSCULAR | Status: AC
  Filled 2024-09-12: qty 3

## 2024-09-12 MED ORDER — CINACALCET HCL 30 MG PO TABS
60.0000 mg | ORAL_TABLET | Freq: Every day | ORAL | Status: DC
  Filled 2024-09-12: qty 2

## 2024-09-12 MED ORDER — OXYCODONE HCL 5 MG PO TABS
5.0000 mg | ORAL_TABLET | Freq: Four times a day (QID) | ORAL | Status: DC | PRN
  Administered 2024-09-12: 08:00:00 5 mg via ORAL
  Filled 2024-09-12: qty 1

## 2024-09-12 MED ORDER — CHLORHEXIDINE GLUCONATE CLOTH 2 % EX PADS: 6.0000 | MEDICATED_PAD | Freq: Every day | CUTANEOUS | Status: DC

## 2024-09-12 MED ORDER — SEVELAMER CARBONATE 800 MG PO TABS: 800.0000 mg | ORAL_TABLET | ORAL | Status: DC

## 2024-09-12 MED ORDER — OXYCODONE HCL 5 MG PO TABS: 5.0000 mg | ORAL_TABLET | ORAL | 0 refills | Status: DC | PRN

## 2024-09-12 MED ORDER — OXYCODONE-ACETAMINOPHEN 5-325 MG PO TABS
1.0000 | ORAL_TABLET | Freq: Once | ORAL | Status: AC
  Administered 2024-09-12: 01:00:00 1 via ORAL
  Filled 2024-09-12: qty 1

## 2024-09-12 MED ORDER — CINACALCET HCL 60 MG PO TABS: 30.0000 mg | ORAL_TABLET | ORAL | Status: DC

## 2024-09-12 MED ORDER — SODIUM CHLORIDE 0.9% FLUSH
3.0000 mL | Freq: Two times a day (BID) | INTRAVENOUS | Status: DC
  Administered 2024-09-12: 10:00:00 3 mL via INTRAVENOUS

## 2024-09-12 MED ORDER — CINACALCET HCL 30 MG PO TABS: 30.0000 mg | ORAL_TABLET | Freq: Every day | ORAL | Status: DC

## 2024-09-12 MED ORDER — PROCHLORPERAZINE EDISYLATE 10 MG/2ML IJ SOLN: 5.0000 mg | Freq: Four times a day (QID) | INTRAMUSCULAR | Status: DC | PRN

## 2024-09-12 MED ORDER — CARVEDILOL 12.5 MG PO TABS
25.0000 mg | ORAL_TABLET | Freq: Two times a day (BID) | ORAL | Status: DC
  Administered 2024-09-12: 10:00:00 25 mg via ORAL
  Filled 2024-09-12: qty 2

## 2024-09-12 MED ORDER — ACETAMINOPHEN 325 MG PO TABS: 650.0000 mg | ORAL_TABLET | Freq: Four times a day (QID) | ORAL | Status: DC | PRN

## 2024-09-12 MED ORDER — FENTANYL CITRATE (PF) 50 MCG/ML IJ SOSY
12.5000 ug | PREFILLED_SYRINGE | INTRAMUSCULAR | Status: DC | PRN
  Administered 2024-09-12: 10:00:00 50 ug via INTRAVENOUS
  Filled 2024-09-12: qty 1

## 2024-09-12 NOTE — Progress Notes (Signed)
° ° °  Patient: Danny [name] FMW:[id number] DOB: 05-19-1956      Brief hospital course: 68 y.o. M with ESRD on HD MWF, sCHF EF 20%, OSA on CPAP, obesity, DM, HTN and thrombocytopenia who presented with fall.  Found to have K > 7.5    This is a no charge note, for further details, please see the H&P by my partner, Dr. Charlton from earlier today.   Principal Problem:   Hyperkalemia Active Problems:   Normocytic anemia   Thrombocytopenia   Type II diabetes mellitus with renal manifestations (HCC)   ESRD (end stage renal disease) on dialysis (HCC)   Chronic systolic CHF (congestive heart failure) (HCC)   Physical debility   OSA on CPAP   Left kidney mass   Fracture of rib, single, closed    Rib fracture Fall - PT/OT - IS - Analgesics   Hyperkalemia ESRD Resolved  Anemia of chronic kidney disease Thrombocytopenia  Chronic systolic CHF Hypertension  Class 1 obesity  OSA  Left kidney mass  Diabetes       Physical Exam: BP (!) 144/61   Pulse 63   Temp 97.9 F (36.6 C) (Oral)   Resp 12   Ht 5' 9 (1.753 m)   Wt 95 kg   SpO2 97%   BMI 30.93 kg/m   Patient seen and examined.      Family Communication: Daughter        Author: Lonni SHAUNNA Dalton, MD 09/12/2024 11:35 AM

## 2024-09-12 NOTE — Hospital Course (Signed)
 68 y.o. M with ESRD on HD MWF, sCHF EF 20%, OSA on CPAP, obesity, DM, HTN and thrombocytopenia who presented with fall.  Found to have K > 7.5

## 2024-09-12 NOTE — ED Notes (Signed)
Breathing is even and unlabored.

## 2024-09-12 NOTE — Evaluation (Signed)
 Physical Therapy Evaluation Patient Details Name: Danny [name] MRN: [id number] DOB: November 01, 1955 Today's Date: 09/12/2024  History of Present Illness  68 y.o. male who returns on 09/11/24 with CP, HA, and knee pain following a fall down 15 steps, and another missed HD session. X-rays neg except right 6th rib fx. Pt in ED 12/8 after syncopal episode that followed missed HD with [facility] on CT.  PMH:  ESRD-HD (MWF), hypertension, diabetes mellitus, GERD, gout, depression, OSA on CPAP, CHF with EF 35-40%, AICD, rectal bleeding, multiple falls.  Clinical Impression  Pt admitted with above diagnosis. Pt from home with family where he has 24/7 supervision and will have more help over the holidays, but has still fallen twice in past 2 weeks with injuries both times. Pt presents with R shoulder, chest, hand, and knee pain. Despite pain, once medicated pt was able to come to edge of stretcher, stand to RW, and ambulate 150' with CGA. No buckling of R knee and no overt LOB. Gave pt incentive spirometer and taught to use. Also gave pt's daughter a gait belt for use at home. Unfortunately pt has to ascend 15 stairs to take a shower. Recommend HHPT at d/c.  Pt currently with functional limitations due to the deficits listed below (see PT Problem List). Pt will benefit from acute skilled PT to increase their independence and safety with mobility to allow discharge.     BP supine 147/60        Sitting 127/54 asymptomatic        Sitting 3 mins 125/57        After walking 130/60 HR 73 bpm SPO2 100% RR 33 with ambulation       If plan is discharge home, recommend the following: A little help with walking and/or transfers;A little help with bathing/dressing/bathroom;Assistance with cooking/housework;Assist for transportation;Help with stairs or ramp for entrance   Can travel by private vehicle        Equipment Recommendations None recommended by PT  Recommendations for Other Services  OT consult     Functional Status Assessment Patient has had a recent decline in their functional status and demonstrates the ability to make significant improvements in function in a reasonable and predictable amount of time.     Precautions / Restrictions Precautions Precautions: Fall Recall of Precautions/Restrictions: Intact Precaution/Restrictions Comments: has now had 2 falls in past 2 weeks Restrictions Weight Bearing Restrictions Per Provider Order: No      Mobility  Bed Mobility Overal bed mobility: Needs Assistance Bed Mobility: Supine to Sit, Sit to Supine     Supine to sit: Min assist Sit to supine: Min assist   General bed mobility comments: pt able to come to side of stretcher from flat with HHA.  Needed min A to LE's to return to supine    Transfers Overall transfer level: Needs assistance Equipment used: Rolling walker (2 wheels) Transfers: Sit to/from Stand Sit to Stand: Contact guard assist           General transfer comment: CGA for safety, no physical assist needed.    Ambulation/Gait Ambulation/Gait assistance: Contact guard assist Gait Distance (Feet): 150 Feet Assistive device: Rolling walker (2 wheels) Gait Pattern/deviations: Step-through pattern, Trunk flexed, Antalgic Gait velocity: decreased Gait velocity interpretation: 1.31 - 2.62 ft/sec, indicative of limited community ambulator   General Gait Details: favoring R knee with ambulation and cues needed to maintain safe proximity to RW. No overt LOB or buckling of knees.  Stairs  Wheelchair Mobility     Tilt Bed    Modified Rankin (Stroke Patients Only)       Balance Overall balance assessment: History of Falls, Needs assistance Sitting-balance support: Feet unsupported, No upper extremity supported Sitting balance-Leahy Scale: Good     Standing balance support: Bilateral upper extremity supported, During functional activity, Reliant on assistive device for balance Standing  balance-Leahy Scale: Poor Standing balance comment: balance deficits noted with dynamic activity                             Pertinent Vitals/Pain Pain Assessment Pain Assessment: 0-10 Pain Score: 8  Pain Location: R shoulder, knee, and hand Pain Descriptors / Indicators: Aching, Sore Pain Intervention(s): Limited activity within patient's tolerance, Monitored during session, Premedicated before session    Home Living Family/patient expects to be discharged to:: Private residence Living Arrangements: Children Available Help at Discharge: Family;Available 24 hours/day Type of Home: House Home Access: Level entry     Alternate Level Stairs-Number of Steps: 15 Home Layout: Two level;1/2 bath on main level;Bed/bath upstairs Home Equipment: Rolling Walker (2 wheels);Grab bars - toilet;Grab bars - tub/shower;Tub bench;Rollator (4 wheels)      Prior Function Prior Level of Function : Independent/Modified Independent             Mobility Comments: uses rollator for out of house, no AD in home. Only goes up and down the stairs to shower ADLs Comments: independent     Extremity/Trunk Assessment   Upper Extremity Assessment Upper Extremity Assessment: Generalized weakness;RUE deficits/detail RUE Deficits / Details: noted swelling R hand. Pt reports shoulder pain but when flexing shoulder it's actually his R sided chest at rib fx where he has pain. Difficult to assess elbow ROM due to IV site. Education given on need for ROM RUE to prevent frozen shoulder while chest is sore RUE: Unable to fully assess due to pain;Unable to fully assess due to immobilization RUE Sensation: WNL RUE Coordination: decreased fine motor    Lower Extremity Assessment Lower Extremity Assessment: RLE deficits/detail;Generalized weakness RLE Deficits / Details: mild tenderness to palpation of patella. R and L joint lines not tender. Full ROM at hip, knee, and ankle. No buckling of knee with  ambulation RLE Sensation: WNL RLE Coordination: WNL    Cervical / Trunk Assessment Cervical / Trunk Assessment: Normal  Communication   Communication Communication: No apparent difficulties Factors Affecting Communication: Non - English speaking, interpreter not available (Spanish, daughter interpreting)    Cognition Arousal: Alert Behavior During Therapy: WFL for tasks assessed/performed   PT - Cognitive impairments: No apparent impairments                       PT - Cognition Comments: per daughter, pt is his usual self today. Followed all commands without additional explanation Following commands: Intact       Cueing Cueing Techniques: Verbal cues     General Comments General comments (skin integrity, edema, etc.): mildly orthostatic with supine to sit. Asymptomatic. Pressure rose with ambulation. Given IS and taught to use. Daughter given gait belt for use at home as well    Exercises     Assessment/Plan    PT Assessment Patient needs continued PT services  PT Problem List Decreased strength;Decreased activity tolerance;Decreased balance;Decreased mobility;Pain       PT Treatment Interventions DME instruction;Gait training;Stair training;Functional mobility training;Therapeutic activities;Therapeutic exercise;Balance training;Patient/family education    PT Goals (  Current goals can be found in the Care Plan section)  Acute Rehab PT Goals Patient Stated Goal: return home, pt does not want to go to a rehab facility, very clear about this PT Goal Formulation: With patient/family Time For Goal Achievement: 09/26/24 Potential to Achieve Goals: Good    Frequency Min 2X/week     Co-evaluation               AM-PAC PT 6 Clicks Mobility  Outcome Measure Help needed turning from your back to your side while in a flat bed without using bedrails?: None Help needed moving from lying on your back to sitting on the side of a flat bed without using  bedrails?: A Little Help needed moving to and from a bed to a chair (including a wheelchair)?: A Little Help needed standing up from a chair using your arms (e.g., wheelchair or bedside chair)?: A Little Help needed to walk in hospital room?: A Little Help needed climbing 3-5 steps with a railing? : A Little 6 Click Score: 19    End of Session Equipment Utilized During Treatment: Gait belt Activity Tolerance: Patient tolerated treatment well Patient left: in bed;with call bell/phone within reach;with family/visitor present Nurse Communication: Mobility status PT Visit Diagnosis: Unsteadiness on feet (R26.81);Repeated falls (R29.6);Muscle weakness (generalized) (M62.81);Pain Pain - Right/Left: Right Pain - part of body: Shoulder;Hand;Knee (chest)    Time: 8849-8779 PT Time Calculation (min) (ACUTE ONLY): 30 min   Charges:   PT Evaluation $PT Eval Moderate Complexity: 1 Mod PT Treatments $Gait Training: 8-22 mins PT General Charges $$ ACUTE PT VISIT: 1 Visit         Richerd Lipoma, PT  Acute Rehab Services Secure chat preferred Office [phone]   Richerd CROME Shawnmichael Parenteau 09/12/2024, 1:08 PM

## 2024-09-12 NOTE — Discharge Summary (Signed)
 Physician Discharge Summary   Patient: Danny [name] MRN: [id number] DOB: [birth year]  Admit date:     09/11/2024  Discharge date: 09/12/2024  Discharge Physician: Lonni SHAUNNA Dalton   PCP: Manuela Sharrell LABOR, MD     Recommendations at discharge:  Follow up with PCP Dr. Manuela in 1 week for rib fracture Follow up with Urology for renal mass Dr. Manuela: Please refer to Urology for evaluation of left kidney mass if not already followed     Discharge Diagnoses: Principal Problem:   Hyperkalemia Active Problems:   Fall   Right rib fracture   Normocytic anemia   Thrombocytopenia   Type II diabetes mellitus with renal manifestations (HCC)   ESRD (end stage renal disease) on dialysis (HCC)   Chronic systolic CHF (congestive heart failure) (HCC)   OSA on CPAP   Left kidney mass   Class 1 obesity     Hospital Course: 68 y.o. M with ESRD on HD MWF, sCHF EF 20%, OSA on CPAP, obesity, DM, HTN and thrombocytopenia who presented with fall.  Found to have K > 7.5    Hyperkalemia Had recently missed dialysis due to diarrhea.    Here, he was admitted and underwent temporizing measures and then urgent hemodialysis.  Post-procedure potassium normalized.  Nephrology recommended resumption of home dialysis schedule.   Fall Rib fracture Admitted and CT of the head, chest abdomen and pelvis showed only 6th right rib fracture, no other traumatic injury.  Radiograph of right knee and right hand ruled out fracture.  Was evaluated by PT who recommended HHPT. - IS/pulmonary toilet - Analgesics   Left kidney mass Incidental finding.  Recommend Urology referral.          The [place]  Controlled Substances Registry was reviewed for this patient prior to discharge.  Consultants: Nephrology Procedures performed: Dialysis  Disposition: Home health Diet recommendation:  Renal diet  DISCHARGE MEDICATION: Allergies as of 09/12/2024   No Known Allergies       Medication List     TAKE these medications    atorvastatin  20 MG tablet Commonly known as: LIPITOR Take 20 mg by mouth daily.   calcium  acetate 667 MG capsule Commonly known as: PHOSLO  Take 2,001 mg by mouth 3 (three) times daily with meals.   carvedilol  25 MG tablet Commonly known as: COREG  Take 25 mg by mouth 2 (two) times daily with a meal.   cinacalcet  60 MG tablet Commonly known as: SENSIPAR  Take 0.5 tablets (30 mg total) by mouth every Monday, Wednesday, and Friday. What changed: how much to take   guaiFENesin -dextromethorphan  100-10 MG/5ML syrup Commonly known as: ROBITUSSIN DM Take 5 mLs by mouth every 8 (eight) hours as needed for cough.   insulin  aspart protamine- aspart (70-30) 100 UNIT/ML injection Commonly known as: NOVOLOG  MIX 70/30 Inject 0.05-0.1 mLs (5-10 Units total) into the skin See admin instructions. 5 units every morning and 4 units every evening What changed:  how much to take additional instructions   lidocaine  5 % Commonly known as: Lidoderm  Place 1 patch onto the skin daily for 10 days. Remove & Discard patch within 12 hours or as directed by MD What changed:  when to take this reasons to take this   losartan  50 MG tablet Commonly known as: COZAAR  Take 50 mg by mouth daily.   oxyCODONE  5 MG immediate release tablet Commonly known as: Oxy IR/ROXICODONE  Take 1 tablet (5 mg total) by mouth every 4 (four) hours as needed (pain).  sevelamer  carbonate 800 MG tablet Commonly known as: RENVELA  Take 1-4 tablets (800-3,200 mg total) by mouth See admin instructions. Take 4 tablets (3200mg ) by mouth three times daily with meals        Follow-up Information     Manuela Sharrell LABOR, MD. Schedule an appointment as soon as possible for a visit in 1 week(s).   Specialty: Internal Medicine Contact information: [address] [place] [address] [phone]         [facility]) Follow up.   Specialty:  Home Health Services Why: [facility] will contact you to arrange home visits. Contact information: [address] [place]  [address] [phone]                Discharge Instructions     Discharge instructions   Complete by: As directed    **IMPORTANT DISCHARGE INSTRUCTIONS**   From Dr. Jonel: You were admitted for weakness and a fall. Here, we found that the weakness was from high potassium levels.  You had dialysis and the potassium is better.  You should resume dialysis on your normal schedule (on Wednesday)  In the fall, you had lots of bruises and scrapes, but only one broken rib  Take acetaminophen  1000 mg three times daily If you have additional pain, take oxycodone  5 mg up to every four hours (start low and go slow)  Use the incentive spirometer five times daily  If you develop fever, cough or worsening chest pain, call your doctor to be evaluated (pneumonia is a rare but possible consequence of a rib fracture)  Resume all your other home medicines without change  Go see your primary doctor in 1 week to be checked up   Increase activity slowly   Complete by: As directed    No wound care   Complete by: As directed        Discharge Exam: Filed Weights   09/11/24 2307  Weight: 95 kg    General: Pt is alert, awake, not in acute distress Cardiovascular: RRR, nl S1-S2, no murmurs appreciated.   No LE edema.   Respiratory: Normal respiratory rate and rhythm.  CTAB without rales or wheezes. Abdominal: Abdomen soft and non-tender.  No distension or HSM.   MSK: Ecchymosis to right face. No swelling. No hand swelling, no deformities of limbs.   Neuro/Psych: Strength symmetric in upper and lower extremities.  Judgment and insight appear normal.   Condition at discharge: fair  The results of significant diagnostics from this hospitalization (including imaging, microbiology, ancillary and laboratory) are listed below  for reference.   Imaging Studies: DG Hand Complete Right Result Date: 09/12/2024 EXAM: 3 OR MORE VIEW(S) XRAY OF THE HAND 09/12/2024 08:43:47 AM COMPARISON: None available. CLINICAL HISTORY: Hand pain FINDINGS: BONES AND JOINTS: No acute fracture. No malalignment. Mild degenerative changes of the distal interphalangeal joints. SOFT TISSUES: Severe atherosclerotic vascular calcifications. Artifact from overlying pulse oximeter. IMPRESSION: 1. No acute osseous abnormality. 2. 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 Electronically signed by: Waddell Calk MD 09/12/2024 08:58 AM EST RP Workstation: [id number]   DG Chest 1 View Result Date: 09/12/2024 EXAM: 1 VIEW(S) XRAY OF THE CHEST 09/11/2024 11:53:00 PM COMPARISON: 09/05/2024 CLINICAL HISTORY: fall FINDINGS: LUNGS AND PLEURA: No focal pulmonary opacity. No pleural effusion. No pneumothorax. No frank interstitial edema. HEART AND MEDIASTINUM: Cardiomegaly, unchanged. Right subclavian ICD. Thoracic aortic atherosclerosis. BONES AND SOFT TISSUES: No acute osseous abnormality. IMPRESSION: 1.  No acute cardiopulmonary abnormality. 2. Cardiomegaly, unchanged. Electronically signed by: Pinkie Pebbles MD 09/12/2024 12:04 AM EST RP Workstation: [id number]   DG Pelvis 1-2 Views Result Date: 09/12/2024 EXAM: 1 VIEW(S) XRAY OF THE PELVIS 09/11/2024 11:53:00 PM COMPARISON: None available. CLINICAL HISTORY: fall FINDINGS: BONES AND JOINTS: No acute fracture. No malalignment. Subcortical cyst of the LEFT acetabulum. SOFT TISSUES: Vascular calcifications. IMPRESSION: 1. No acute osseous abnormality. Electronically signed by: Pinkie Pebbles MD 09/12/2024 12:03 AM EST RP Workstation: [id number]   DG Knee Complete 4 Views Right Result Date: 09/12/2024 EXAM: 4 OR MORE VIEW(S) XRAY OF THE KNEE 09/11/2024 11:53:00 PM COMPARISON: None available. CLINICAL HISTORY: fall FINDINGS: BONES AND JOINTS: No acute fracture. No malalignment. No significant joint  effusion. Benign exostosis along the lateral femoral condyle. No significant degenerative changes. SOFT TISSUES: Mild prepatellar soft tissue swelling. IMPRESSION: 1. No acute fracture or dislocation. 2. Mild prepatellar soft tissue swelling. Electronically signed by: Pinkie Pebbles MD 09/12/2024 12:02 AM EST RP Workstation: [id number]   CT CHEST ABDOMEN PELVIS W CONTRAST Result Date: 09/12/2024 EXAM: CT CHEST, ABDOMEN AND PELVIS WITH CONTRAST 09/11/2024 11:46:54 PM TECHNIQUE: CT of the chest, abdomen and pelvis was performed with the administration of intravenous contrast. Multiplanar reformatted images are provided for review. Automated exposure control, iterative reconstruction, and/or weight based adjustment of the mA/kV was utilized to reduce the radiation dose to as low as reasonably achievable. COMPARISON: CT angiogram chest 05/27/2024. CT chest abdomen and pelvis 01/30/2022. CLINICAL HISTORY: Polytrauma, blunt. FINDINGS: CHEST: MEDIASTINUM AND LYMPH NODES: The heart is moderately enlarged, unchanged. Small pericardial effusion is stable. The aorta is normal in size. There are atherosclerotic calcifications of the aorta and coronary arteries. Right sided pacemaker is again seen. The central airways are clear. No mediastinal, hilar or axillary lymphadenopathy. LUNGS AND PLEURA: There is a trace right pleural effusion. There are minimal atelectatic changes in the lung bases. The lungs are otherwise clear. There is a calcified granuloma in the right lower lobe. No pneumothorax. ABDOMEN AND PELVIS: LIVER: The liver is enlarged. There is slightly nodular liver contour suggesting cirrhosis. GALLBLADDER AND BILE DUCTS: There is minimal stranding surrounding the gallbladder. No biliary ductal dilatation. SPLEEN: No acute abnormality. PANCREAS: No acute abnormality. ADRENAL GLANDS: No acute abnormality. KIDNEYS, URETERS AND BLADDER: There is bilateral renal atrophy. There are rounded hypodensities in both  kidneys which are too small to characterize, likely cysts. There is an exophytic mass in the inferior pole of the left kidney measuring 4.5 x 4.3 x 3.8 cm which has increased in size. No stones in the kidneys or ureters. No hydronephrosis. No perinephric or periureteral stranding. Urinary bladder is unremarkable. GI AND BOWEL: Stomach demonstrates no acute abnormality. There is some wall thickening of jejunal loops in the left abdomen with interloop fluid. The appendix appears normal. There is no bowel obstruction. REPRODUCTIVE ORGANS: No acute abnormality. PERITONEUM AND RETROPERITONEUM: There is small volume of ascites throughout the abdomen and pelvis. No free air. VASCULATURE: Aorta is normal in caliber. Calcifications of the aorta, iliac arteries, and branch vessels. ABDOMINAL AND PELVIS LYMPH NODES: No lymphadenopathy. BONES AND SOFT TISSUES: There is acute nondisplaced anterior right 6th rib fracture. Lucent lesion in the superior left acetabulum is unchanged from 2023. No acute fractures are identified in the abdomen or pelvis. There is mild diffuse bony wall edema. There are small fat containing bilateral inguinal hernias. No focal soft tissue abnormality. IMPRESSION: 1. Acute nondisplaced anterior right 6th rib fracture. 2. Trace right pleural effusion. Stable cardiomegaly with  small pericardial effusion. 3. Exophytic mass in the inferior pole of the left kidney measuring 4.5 x 4.3 x 3.8 cm, increased in size. Renal cell carcinoma cannot be excluded. Recommend urology consultation. 4. Wall thickening of jejunal loops in the left abdomen with interloop fluid may represent nonspecific enteritis . No bowel obstruction. 5. Small volume ascites throughout the abdomen and pelvis. 6. Hepatomegaly with mildly nodular contour suggesting cirrhosis. Electronically signed by: Greig Pique MD 09/12/2024 12:02 AM EST RP Workstation: [id number]   CT MAXILLOFACIAL WO CONTRAST Result Date: 09/11/2024 EXAM: CT OF THE  FACE WITHOUT CONTRAST 09/11/2024 11:46:54 PM TECHNIQUE: CT of the face was performed without the administration of intravenous contrast. Multiplanar reformatted images are provided for review. Automated exposure control, iterative reconstruction, and/or weight based adjustment of the mA/kV was utilized to reduce the radiation dose to as low as reasonably achievable. COMPARISON: None available. CLINICAL HISTORY: Facial trauma, blunt. FINDINGS: FACIAL BONES: No acute facial fracture. No mandibular dislocation. No suspicious bone lesion. ORBITS: Globes are intact. No acute traumatic injury. No inflammatory change. SINUSES AND MASTOIDS: Mild mucosal thickening in the right sphenoid and bilateral maxillary sinuses. Mastoid air cells are clear. SOFT TISSUES: No acute abnormality. IMPRESSION: 1. No acute facial fracture. Electronically signed by: Pinkie Pebbles MD 09/11/2024 11:55 PM EST RP Workstation: [id number]   CT CERVICAL SPINE WO CONTRAST Result Date: 09/11/2024 EXAM: CT CERVICAL SPINE WITHOUT CONTRAST 09/11/2024 11:46:54 PM TECHNIQUE: CT of the cervical spine was performed without the administration of intravenous contrast. Multiplanar reformatted images are provided for review. Automated exposure control, iterative reconstruction, and/or weight based adjustment of the mA/kV was utilized to reduce the radiation dose to as low as reasonably achievable. COMPARISON: 09/05/2024. CLINICAL HISTORY: Polytrauma, blunt FINDINGS: BONES AND ALIGNMENT: No acute fracture or traumatic malalignment. DEGENERATIVE CHANGES: No significant degenerative changes. SOFT TISSUES: No prevertebral soft tissue swelling. IMPRESSION: 1. No significant abnormality Electronically signed by: Pinkie Pebbles MD 09/11/2024 11:54 PM EST RP Workstation: [id number]   CT HEAD WO CONTRAST Result Date: 09/11/2024 EXAM: CT HEAD WITHOUT 09/11/2024 11:46:54 PM TECHNIQUE: CT of the head was performed without the administration of intravenous  contrast. Automated exposure control, iterative reconstruction, and/or weight based adjustment of the mA/kV was utilized to reduce the radiation dose to as low as reasonably achievable. COMPARISON: 09/05/2024 CLINICAL HISTORY: Head trauma, moderate-severe FINDINGS: BRAIN AND VENTRICLES: Trace subarachnoid hemorrhage along the right sylvian fissure has essentially resolved. Global cortical atrophy. Subcortical and periventricular small vessel ischemic changes. Intracranial atherosclerosis. No mass effect or midline shift. No extra-axial fluid collection. No evidence of acute infarct. No hydrocephalus. ORBITS: No acute abnormality. SINUSES AND MASTOIDS: No acute abnormality. SOFT TISSUES AND SKULL: Right frontal scalp contusion. No acute skull fracture. IMPRESSION: 1. No acute intracranial abnormality. 2. Trace subarachnoid hemorrhage along the right sylvian fissure has essentially resolved. 3. Right frontal scalp contusion. Electronically signed by: Pinkie Pebbles MD 09/11/2024 11:53 PM EST RP Workstation: [id number]   CT Head Wo Contrast Result Date: 09/05/2024 EXAM: CT HEAD WITHOUT CONTRAST 09/05/2024 06:05:44 PM TECHNIQUE: CT of the head was performed without the administration of intravenous contrast. Automated exposure control, iterative reconstruction, and/or weight based adjustment of the mA/kV was utilized to reduce the radiation dose to as low as reasonably achievable. COMPARISON: CT head earlier today. CLINICAL HISTORY: Head trauma, minor (Age >= 65y); SAH FINDINGS: BRAIN AND VENTRICLES: Stable trace subarachnoid hemorrhage layering in the right sylvian fissure. Similar mildly prominent retrocerebellar csf. Similar patchy white matter hypodensities, compatible with chronic microvascular ischemic change.  No evidence of acute infarct. No hydrocephalus. No extra-axial collection. No mass effect or midline shift. ORBITS: No acute abnormality. SINUSES: No acute abnormality. SOFT TISSUES AND SKULL: No acute  soft tissue abnormality. No skull fracture. IMPRESSION: 1. Stable trace subarachnoid hemorrhage layering in the right sylvian fissure. Electronically signed by: Gilmore Molt MD 09/05/2024 06:13 PM EST RP Workstation: [id number]   DG Hip Unilat W or Wo Pelvis 2-3 Views Left Result Date: 09/05/2024 CLINICAL DATA:  Fall.  Left hip pain. EXAM: DG HIP (WITH OR WITHOUT PELVIS) 3V LEFT COMPARISON:  08/11/2022 FINDINGS: There is no evidence of hip fracture or dislocation. Mild left hip osteoarthritis seen with geode noted in acetabular roof. Extensive pelvic and peripheral vascular calcification also seen. IMPRESSION: No acute findings. Mild left hip osteoarthritis. Electronically Signed   By: Norleen DELENA Kil M.D.   On: 09/05/2024 08:16   DG Chest Port 1 View Result Date: 09/05/2024 CLINICAL DATA:  Fall.  Chest pain. EXAM: PORTABLE CHEST 1 VIEW COMPARISON:  06/12/2024 FINDINGS: Cardiomegaly stable. AICD remains in place. No pneumothorax or hemothorax visualized. Both lungs are clear. IMPRESSION: Stable cardiomegaly. No active lung disease. Electronically Signed   By: Norleen DELENA Kil M.D.   On: 09/05/2024 08:15   DG Shoulder Right Result Date: 09/05/2024 CLINICAL DATA:  Fall.  Right shoulder injury and pain. EXAM: RIGHT SHOULDER - 3 VIEW COMPARISON:  None Available. FINDINGS: There is no evidence of fracture or dislocation. Mild degenerative spurring of AC joint and undersurface of acromion noted. Generalized osteopenia. IMPRESSION: No acute findings. Mild degenerative spurring of AC joint and undersurface of acromion. Electronically Signed   By: Norleen DELENA Kil M.D.   On: 09/05/2024 08:14   CT Head Wo Contrast Result Date: 09/05/2024 CLINICAL DATA:  Patient fell last night and struck head. EXAM: CT HEAD WITHOUT CONTRAST CT CERVICAL SPINE WITHOUT CONTRAST TECHNIQUE: Multidetector CT imaging of the head and cervical spine was performed following the standard protocol without intravenous contrast. Multiplanar CT image  reconstructions of the cervical spine were also generated. RADIATION DOSE REDUCTION: This exam was performed according to the departmental dose-optimization program which includes automated exposure control, adjustment of the mA and/or kV according to patient size and/or use of iterative reconstruction technique. COMPARISON:  Head CT 08/11/2022.  Cervical spine 01/30/2022. FINDINGS: CT HEAD FINDINGS Brain: There is no evidence for acute hemorrhage, hydrocephalus, mass lesion, or abnormal extra-axial fluid collection. No definite CT evidence for acute infarction. Subtle linear increased density in the deep right sylvian fissure suggests trace subarachnoid hemorrhage (axial 16/2 and coronal 44/4. No evidence for subdural hematoma. No midline shift or substantial mass effect. No hydrocephalus. Diffuse loss of parenchymal volume is consistent with atrophy. Patchy low attenuation in the deep hemispheric and periventricular white matter is nonspecific, but likely reflects chronic microvascular ischemic demyelination. Vascular: No hyperdense vessel or unexpected calcification. Skull: No evidence for fracture. No worrisome lytic or sclerotic lesion. Sinuses/Orbits: Similar effusion posterior right mastoid air cells. Visualized portions of the globes and intraorbital fat are unremarkable. Other: None. CT CERVICAL SPINE FINDINGS Study limited by incomplete visualization of the C7-T1 level. Alignment: Straightening of normal cervical lordosis without evidence for traumatic subluxation. Skull base and vertebrae: No acute fracture. No primary bone lesion or focal pathologic process. Soft tissues and spinal canal: No prevertebral fluid or swelling. No visible canal hematoma. Disc levels:  Preserved. Upper chest: N/A Other: None. IMPRESSION: 1. Subtle linear increased density in the deep right Sylvian fissure compatible with trace subarachnoid hemorrhage. No evidence for  subdural hematoma. No midline shift or substantial mass  effect. 2. Atrophy with chronic small vessel ischemic disease. 3. No evidence for cervical spine fracture or traumatic subluxation. Study limited by incomplete visualization of the C7-T1 level. Critical Value/emergent results were called by telephone at the time of interpretation on 09/05/2024 at 7:39 am to provider Dr. Arlander, who verbally acknowledged these results. Electronically Signed   By: Camellia Candle M.D.   On: 09/05/2024 07:40   CT Cervical Spine Wo Contrast Result Date: 09/05/2024 CLINICAL DATA:  Patient fell last night and struck head. EXAM: CT HEAD WITHOUT CONTRAST CT CERVICAL SPINE WITHOUT CONTRAST TECHNIQUE: Multidetector CT imaging of the head and cervical spine was performed following the standard protocol without intravenous contrast. Multiplanar CT image reconstructions of the cervical spine were also generated. RADIATION DOSE REDUCTION: This exam was performed according to the departmental dose-optimization program which includes automated exposure control, adjustment of the mA and/or kV according to patient size and/or use of iterative reconstruction technique. COMPARISON:  Head CT 08/11/2022.  Cervical spine 01/30/2022. FINDINGS: CT HEAD FINDINGS Brain: There is no evidence for acute hemorrhage, hydrocephalus, mass lesion, or abnormal extra-axial fluid collection. No definite CT evidence for acute infarction. Subtle linear increased density in the deep right sylvian fissure suggests trace subarachnoid hemorrhage (axial 16/2 and coronal 44/4. No evidence for subdural hematoma. No midline shift or substantial mass effect. No hydrocephalus. Diffuse loss of parenchymal volume is consistent with atrophy. Patchy low attenuation in the deep hemispheric and periventricular white matter is nonspecific, but likely reflects chronic microvascular ischemic demyelination. Vascular: No hyperdense vessel or unexpected calcification. Skull: No evidence for fracture. No worrisome lytic or sclerotic lesion.  Sinuses/Orbits: Similar effusion posterior right mastoid air cells. Visualized portions of the globes and intraorbital fat are unremarkable. Other: None. CT CERVICAL SPINE FINDINGS Study limited by incomplete visualization of the C7-T1 level. Alignment: Straightening of normal cervical lordosis without evidence for traumatic subluxation. Skull base and vertebrae: No acute fracture. No primary bone lesion or focal pathologic process. Soft tissues and spinal canal: No prevertebral fluid or swelling. No visible canal hematoma. Disc levels:  Preserved. Upper chest: N/A Other: None. IMPRESSION: 1. Subtle linear increased density in the deep right Sylvian fissure compatible with trace subarachnoid hemorrhage. No evidence for subdural hematoma. No midline shift or substantial mass effect. 2. Atrophy with chronic small vessel ischemic disease. 3. No evidence for cervical spine fracture or traumatic subluxation. Study limited by incomplete visualization of the C7-T1 level. Critical Value/emergent results were called by telephone at the time of interpretation on 09/05/2024 at 7:39 am to provider Dr. Arlander, who verbally acknowledged these results. Electronically Signed   By: Camellia Candle M.D.   On: 09/05/2024 07:40    Microbiology: Results for orders placed or performed during the hospital encounter of 06/12/24  Resp panel by RT-PCR (RSV, Flu A&B, Covid) Anterior Nasal Swab     Status: None   Collection Time: 06/12/24  4:59 PM   Specimen: Anterior Nasal Swab  Result Value Ref Range Status   SARS Coronavirus 2 by RT PCR NEGATIVE NEGATIVE Final    Comment: (NOTE) SARS-CoV-2 target nucleic acids are NOT DETECTED.  The SARS-CoV-2 RNA is generally detectable in upper respiratory specimens during the acute phase of infection. The lowest concentration of SARS-CoV-2 viral copies this assay can detect is 138 copies/mL. A negative result does not preclude SARS-Cov-2 infection and should not be used as the sole basis  for treatment or other patient management decisions. A  negative result may occur with  improper specimen collection/handling, submission of specimen other than nasopharyngeal swab, presence of viral mutation(s) within the areas targeted by this assay, and inadequate number of viral copies(<138 copies/mL). A negative result must be combined with clinical observations, patient history, and epidemiological information. The expected result is Negative.  Fact Sheet for Patients:  bloggercourse.com  Fact Sheet for Healthcare Providers:  seriousbroker.it  This test is no t yet approved or cleared by the [place]  FDA and  has been authorized for detection and/or diagnosis of SARS-CoV-2 by FDA under an Emergency Use Authorization (EUA). This EUA will remain  in effect (meaning this test can be used) for the duration of the COVID-19 declaration under Section 564(b)(1) of the Act, 21 U.S.C.section 360bbb-3(b)(1), unless the authorization is terminated  or revoked sooner.       Influenza A by PCR NEGATIVE NEGATIVE Final   Influenza B by PCR NEGATIVE NEGATIVE Final    Comment: (NOTE) The Xpert Xpress SARS-CoV-2/FLU/RSV plus assay is intended as an aid in the diagnosis of influenza from Nasopharyngeal swab specimens and should not be used as a sole basis for treatment. Nasal washings and aspirates are unacceptable for Xpert Xpress SARS-CoV-2/FLU/RSV testing.  Fact Sheet for Patients: bloggercourse.com  Fact Sheet for Healthcare Providers: seriousbroker.it  This test is not yet approved or cleared by the [place]  FDA and has been authorized for detection and/or diagnosis of SARS-CoV-2 by FDA under an Emergency Use Authorization (EUA). This EUA will remain in effect (meaning this test can be used) for the duration of the COVID-19 declaration under Section 564(b)(1) of the Act, 21  U.S.C. section 360bbb-3(b)(1), unless the authorization is terminated or revoked.     Resp Syncytial Virus by PCR NEGATIVE NEGATIVE Final    Comment: (NOTE) Fact Sheet for Patients: bloggercourse.com  Fact Sheet for Healthcare Providers: seriousbroker.it  This test is not yet approved or cleared by the [place]  FDA and has been authorized for detection and/or diagnosis of SARS-CoV-2 by FDA under an Emergency Use Authorization (EUA). This EUA will remain in effect (meaning this test can be used) for the duration of the COVID-19 declaration under Section 564(b)(1) of the Act, 21 U.S.C. section 360bbb-3(b)(1), unless the authorization is terminated or revoked.  Performed at [facility], [address] [place], [place] [address]     Labs: CBC: Recent Labs  Lab 09/11/24 2303 09/11/24 2309 09/11/24 2314 09/12/24 0129  WBC 5.6  --   --  5.2  HGB 9.0* 9.9* 9.5* 8.4*  HCT 27.9* 29.0* 28.0* 26.2*  MCV 97.9  --   --  96.7  PLT 91*  --   --  79*   Basic Metabolic Panel: Recent Labs  Lab 09/11/24 2303 09/11/24 2309 09/11/24 2314 09/12/24 0129 09/12/24 0754  NA 131* 129* 129* 131*  --   K >7.5* 7.5* 7.5* 6.6* 4.3  CL 92* 97* 98 92*  --   CO2 25  --   --  24  --   GLUCOSE 109* 110* 110* 124*  --   BUN 82* 86* 84* 82*  --   CREATININE 9.04* 9.30* 9.50* 9.13*  --   CALCIUM  7.7*  --   --  7.5*  --   MG 3.1*  --   --   --   --    Liver Function Tests: Recent Labs  Lab 09/11/24 2303  AST 17  ALT 17  ALKPHOS 134*  BILITOT 1.0  PROT 7.3  ALBUMIN 3.6   CBG: Recent Labs  Lab 09/12/24 0006 09/12/24 0137 09/12/24 0800 09/12/24 1228  GLUCAP 104* 117* 68* 79    Discharge time spent: approximately 45 minutes spent on discharge counseling, evaluation of patient on day of discharge, and coordination of discharge planning with nursing, social work, pharmacy and case management  Signed: Lonni SHAUNNA Dalton, MD [facility] 09/12/2024

## 2024-09-12 NOTE — ED Notes (Signed)
 Support person took of patients dressing off right arm without Rns knowledge.

## 2024-09-12 NOTE — Progress Notes (Signed)
 TOC/ICM consulted for [facility]. [facility] accepted referral for [facility] and PT.

## 2024-09-12 NOTE — ED Notes (Signed)
 Pt return from dialysis zero noted distress.

## 2024-09-12 NOTE — Progress Notes (Signed)
 Called by ED about this patient w/ ESRD and a K+ of 7.5.  EKG shows a paced rhythm. Temporizing measures are ordered. CXR is negative. Will write HD orders for this patient overnight. 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  Pt has a LUE AVG.    Hep B Surface Ab (anti-HBs) 23 mIU/mL No Reference Range Provided - February 29, 2024  Hep B Surface Ag (HBsAg) NEGATIVE No Reference Range Provided Normal August 29, 2024   OP HD: MWF [facility] [address]  4.5h   B450   84.7kg   2K bath   Heparin  3000

## 2024-09-12 NOTE — Progress Notes (Cosign Needed Addendum)
 Patient received HD overnight and 3.3L was removed. Discussed with the primary care team. PT has evaluated the patient and it was recommended patient gets set up with [facility] and PT. Patient does not have to be admitted and he's okay for discharge from a renal standpoint. Okay for patient to resume dialysis at his outpatient HD center.  Charmaine Piety, NP [facility]

## 2024-09-12 NOTE — ED Notes (Signed)
 Pt given apple juice

## 2024-09-12 NOTE — ED Notes (Signed)
 PT CBG IS 68 AT 8:00AM

## 2024-09-12 NOTE — H&P (Addendum)
 History and Physical    Mohd. Derflinger [name] FMW:[id number] DOB: [birth year] DOA: 09/11/2024  PCP: Manuela Sharrell LABOR, MD   Patient coming from: Home   Chief Complaint: Pain in chest and right knee after a fall   HPI: Danny [name] is a 68 y.o. male with medical history significant for hypertension, type 2 diabetes mellitus, OSA on CPAP, chronic HFrEF, chronic anemia and thrombocytopenia, and ESRD on hemodialysis who presents with pain in his chest and right knee after a fall.  Patient missed dialysis on 09/09/2024 due to diarrhea, has been generally weak and unsteady on his feet, and then fell down some stairs tonight due to generalized weakness and lightheadedness when he got up to go to the bathroom.  He has pain in his right chest, face, and right knee.  He also had skin tears on the forearm that were dressed by family at home.  Family called EMS and he was brought into the ED.  He denies any focal numbness or weakness, fevers or chills, or abdominal pain.  ED Course: Upon arrival to the ED, patient is found to be afebrile and saturating well on room air with normal HR and stable BP.  Labs are most notable for potassium >7.5, BUN 92, normal WBC, normal lactate, hemoglobin 9.0, and platelets 91,000.  CT demonstrates left renal mass and acute nondisplaced anterior right sixth rib fracture.  Nephrology was consulted by the ED physician and the patient was treated with Lokelma , IV calcium , insulin  with dextrose , and albuterol .  Review of Systems:  All other systems reviewed and apart from HPI, are negative.  Past Medical History:  Diagnosis Date   Diabetes mellitus without complication (HCC)    ESRD (end stage renal disease) on dialysis (HCC)    a.) T-Th-Sat   Gout    Hypertension    LBBB (left bundle branch block)    OSA on CPAP     Past Surgical History:  Procedure Laterality Date   AV FISTULA PLACEMENT     DIALYSIS/PERMA CATHETER INSERTION Right 08/11/2022    Procedure: DIALYSIS/PERMA CATHETER INSERTION;  Surgeon: Marea Selinda RAMAN, MD;  Location: [facility] INVASIVE CV LAB;  Service: Cardiovascular;  Laterality: Right;   INSERTION OF ARTERIOVENOUS (AV) ARTEGRAFT ARM Left 09/04/2022   Procedure: INSERTION OF ARTERIOVENOUS (AV) ARTEGRAFT ARM;  Surgeon: Marea Selinda RAMAN, MD;  Location: [facility] ORS;  Service: Vascular;  Laterality: Left;   LIGATION OF ARTERIOVENOUS  FISTULA Left 09/04/2022   Procedure: LIGATION OF ARTERIOVENOUS  FISTULA;  Surgeon: Marea Selinda RAMAN, MD;  Location: [facility] ORS;  Service: Vascular;  Laterality: Left;   miscellaneous     peritoneal dialysis catheter placement and removal   REVISON OF ARTERIOVENOUS FISTULA Left 09/04/2022   Procedure: REVISON OF ARTERIOVENOUS FISTULA;  Surgeon: Marea Selinda RAMAN, MD;  Location: [facility] ORS;  Service: Vascular;  Laterality: Left;    Social History:   reports that he has never smoked. He has never used smokeless tobacco. He reports that he does not drink alcohol and does not use drugs.  Allergies[1]  Family History  Problem Relation Age of Onset   Diabetes Mother    Prostate cancer Father    Kidney disease Sister      Prior to Admission medications  Medication Sig Start Date End Date Taking? Authorizing Provider  amLODipine  (NORVASC ) 5 MG tablet Take 1 tablet (5 mg total) by mouth at bedtime. 05/28/24   Jens Durand, MD  aspirin  EC 81 MG tablet Take 1 tablet (81  mg total) by mouth daily. Swallow whole. 05/29/24   Jens Durand, MD  atorvastatin  (LIPITOR) 20 MG tablet Take 20 mg by mouth daily. 12/29/23 12/28/24  [provider]  calcium  acetate (PHOSLO ) 667 MG capsule Take 2,001 mg by mouth 3 (three) times daily with meals.    [provider]  carvedilol  (COREG ) 25 MG tablet Take 25 mg by mouth 2 (two) times daily with a meal.    [provider]  Cholecalciferol (VITAMIN D-3) 125 MCG (5000 UT) TABS Take 5,000 Units by mouth daily.    [provider]  cinacalcet  (SENSIPAR ) 60 MG tablet  Take 60 mg by mouth daily. 04/10/20   [provider]  guaiFENesin -dextromethorphan  (ROBITUSSIN DM) 100-10 MG/5ML syrup Take 5 mLs by mouth every 8 (eight) hours as needed for cough. 05/28/24   Jens Durand, MD  insulin  aspart protamine- aspart (NOVOLOG  MIX 70/30) (70-30) 100 UNIT/ML injection Inject 0.05-0.1 mLs (5-10 Units total) into the skin See admin instructions. 5 units every morning and 4 units every evening Patient taking differently: Inject 3-8 Units into the skin See admin instructions. 8 units every morning and 3 units every evening 02/21/21   Wouk, Devaughn Sayres, MD  lidocaine  (LIDODERM ) 5 % Place 1 patch onto the skin daily for 10 days. Remove & Discard patch within 12 hours or as directed by MD 09/05/24 09/15/24  Nicholaus Rolland BRAVO, MD  losartan  (COZAAR ) 50 MG tablet Take 50 mg by mouth daily. 12/29/23 12/28/24  [provider]  sevelamer  carbonate (RENVELA ) 800 MG tablet Take 800-3,200 mg by mouth See admin instructions. Take 4 tablets (3200mg ) by mouth three times daily with meals    [provider]    Physical Exam: Vitals:   09/12/24 0030 09/12/24 0045 09/12/24 0054 09/12/24 0057  BP: (!) 143/71 137/69 134/61   Pulse: (!) 59 (!) 59    Resp: (!) 21 11 12 13   Temp:      TempSrc:      SpO2: 100% 100%  100%  Weight:      Height:        Constitutional: NAD, no pallor or diaphoresis   Eyes: PERTLA, lids and conjunctivae normal ENMT: Mucous membranes are moist. Posterior pharynx clear of any exudate or lesions.   Neck: supple, no masses  Respiratory: no wheezing, no crackles. No accessory muscle use.  Cardiovascular: S1 & S2 heard, regular rate and rhythm. No extremity edema.   Abdomen: No tenderness, soft. Bowel sounds active.  Musculoskeletal: no clubbing / cyanosis. Right chest and right shoulder tender.   Skin: Facial and right shoulder ecchymoses, forearm skin tear. Skin otherwise warm, dry, well-perfused. Neurologic: CN 2-12 grossly intact. Moving  all extremities. Alert and oriented to person, place, and situation.  Psychiatric: Calm. Cooperative.    Labs and Imaging on Admission: I have personally reviewed following labs and imaging studies  CBC: Recent Labs  Lab 09/05/24 0805 09/11/24 2303 09/11/24 2309 09/11/24 2314  WBC 4.9 5.6  --   --   NEUTROABS 3.6  --   --   --   HGB 9.6* 9.0* 9.9* 9.5*  HCT 29.2* 27.9* 29.0* 28.0*  MCV 96.7 97.9  --   --   PLT 83* 91*  --   --    Basic Metabolic Panel: Recent Labs  Lab 09/05/24 0805 09/05/24 1200 09/11/24 2303 09/11/24 2309 09/11/24 2314  NA 133*  --  131* 129* 129*  K >7.5* >7.5* >7.5* 7.5* 7.5*  CL 92*  --  92* 97* 98  CO2 23  --  25  --   --   GLUCOSE 104*  --  109* 110* 110*  BUN 101*  --  82* 86* 84*  CREATININE 9.17*  --  9.04* 9.30* 9.50*  CALCIUM  8.9  --  7.7*  --   --   MG 3.6*  --  3.1*  --   --    GFR: Estimated Creatinine Clearance: 8.5 mL/min (A) (by C-G formula based on SCr of 9.5 mg/dL (H)). Liver Function Tests: Recent Labs  Lab 09/05/24 0805 09/11/24 2303  AST 20 17  ALT 12 17  ALKPHOS 183* 134*  BILITOT 0.5 1.0  PROT 7.6 7.3  ALBUMIN 4.0 3.6   No results for input(s): LIPASE, AMYLASE in the last 168 hours. No results for input(s): AMMONIA in the last 168 hours. Coagulation Profile: Recent Labs  Lab 09/05/24 0805 09/11/24 2303  INR 1.2 1.4*   Cardiac Enzymes: No results for input(s): CKTOTAL, CKMB, CKMBINDEX, TROPONINI in the last 168 hours. BNP (last 3 results) Recent Labs    09/05/24 0805  PROBNP >35,000.0*   HbA1C: No results for input(s): HGBA1C in the last 72 hours. CBG: Recent Labs  Lab 09/05/24 0849 09/12/24 0006  GLUCAP 116* 104*   Lipid Profile: No results for input(s): CHOL, HDL, LDLCALC, TRIG, CHOLHDL, LDLDIRECT in the last 72 hours. Thyroid Function Tests: No results for input(s): TSH, T4TOTAL, FREET4, T3FREE, THYROIDAB in the last 72 hours. Anemia Panel: No results  for input(s): VITAMINB12, FOLATE, FERRITIN, TIBC, IRON, RETICCTPCT in the last 72 hours. Urine analysis:    Component Value Date/Time   COLORURINE YELLOW (A) 09/22/2017 1006   APPEARANCEUR CLEAR (A) 09/22/2017 1006   APPEARANCEUR Clear 09/10/2013 2254   LABSPEC 1.008 09/22/2017 1006   LABSPEC 1.003 09/10/2013 2254   PHURINE 9.0 (H) 09/22/2017 1006   GLUCOSEU 150 (A) 09/22/2017 1006   GLUCOSEU >=500 09/10/2013 2254   HGBUR NEGATIVE 09/22/2017 1006   BILIRUBINUR NEGATIVE 09/22/2017 1006   BILIRUBINUR Negative 09/10/2013 2254   KETONESUR NEGATIVE 09/22/2017 1006   PROTEINUR 100 (A) 09/22/2017 1006   NITRITE NEGATIVE 09/22/2017 1006   LEUKOCYTESUR NEGATIVE 09/22/2017 1006   LEUKOCYTESUR Negative 09/10/2013 2254   Sepsis Labs: @LABRCNTIP (procalcitonin:4,lacticidven:4) )No results found for this or any previous visit (from the past 240 hours).   Radiological Exams on Admission: DG Chest 1 View Result Date: 09/12/2024 EXAM: 1 VIEW(S) XRAY OF THE CHEST 09/11/2024 11:53:00 PM COMPARISON: 09/05/2024 CLINICAL HISTORY: fall FINDINGS: LUNGS AND PLEURA: No focal pulmonary opacity. No pleural effusion. No pneumothorax. No frank interstitial edema. HEART AND MEDIASTINUM: Cardiomegaly, unchanged. Right subclavian ICD. Thoracic aortic atherosclerosis. BONES AND SOFT TISSUES: No acute osseous abnormality. IMPRESSION: 1. No acute cardiopulmonary abnormality. 2. Cardiomegaly, unchanged. Electronically signed by: Pinkie Pebbles MD 09/12/2024 12:04 AM EST RP Workstation: [id number]   DG Pelvis 1-2 Views Result Date: 09/12/2024 EXAM: 1 VIEW(S) XRAY OF THE PELVIS 09/11/2024 11:53:00 PM COMPARISON: None available. CLINICAL HISTORY: fall FINDINGS: BONES AND JOINTS: No acute fracture. No malalignment. Subcortical cyst of the LEFT acetabulum. SOFT TISSUES: Vascular calcifications. IMPRESSION: 1. No acute osseous abnormality. Electronically signed by: Pinkie Pebbles MD 09/12/2024 12:03 AM EST RP  Workstation: [id number]   DG Knee Complete 4 Views Right Result Date: 09/12/2024 EXAM: 4 OR MORE VIEW(S) XRAY OF THE KNEE 09/11/2024 11:53:00 PM COMPARISON: None available. CLINICAL HISTORY: fall FINDINGS: BONES AND JOINTS: No acute fracture. No malalignment. No significant joint effusion. Benign exostosis along the lateral femoral condyle. No significant  degenerative changes. SOFT TISSUES: Mild prepatellar soft tissue swelling. IMPRESSION: 1. No acute fracture or dislocation. 2. Mild prepatellar soft tissue swelling. Electronically signed by: Pinkie Pebbles MD 09/12/2024 12:02 AM EST RP Workstation: [id number]   CT CHEST ABDOMEN PELVIS W CONTRAST Result Date: 09/12/2024 EXAM: CT CHEST, ABDOMEN AND PELVIS WITH CONTRAST 09/11/2024 11:46:54 PM TECHNIQUE: CT of the chest, abdomen and pelvis was performed with the administration of intravenous contrast. Multiplanar reformatted images are provided for review. Automated exposure control, iterative reconstruction, and/or weight based adjustment of the mA/kV was utilized to reduce the radiation dose to as low as reasonably achievable. COMPARISON: CT angiogram chest 05/27/2024. CT chest abdomen and pelvis 01/30/2022. CLINICAL HISTORY: Polytrauma, blunt. FINDINGS: CHEST: MEDIASTINUM AND LYMPH NODES: The heart is moderately enlarged, unchanged. Small pericardial effusion is stable. The aorta is normal in size. There are atherosclerotic calcifications of the aorta and coronary arteries. Right sided pacemaker is again seen. The central airways are clear. No mediastinal, hilar or axillary lymphadenopathy. LUNGS AND PLEURA: There is a trace right pleural effusion. There are minimal atelectatic changes in the lung bases. The lungs are otherwise clear. There is a calcified granuloma in the right lower lobe. No pneumothorax. ABDOMEN AND PELVIS: LIVER: The liver is enlarged. There is slightly nodular liver contour suggesting cirrhosis. GALLBLADDER AND BILE DUCTS: There is  minimal stranding surrounding the gallbladder. No biliary ductal dilatation. SPLEEN: No acute abnormality. PANCREAS: No acute abnormality. ADRENAL GLANDS: No acute abnormality. KIDNEYS, URETERS AND BLADDER: There is bilateral renal atrophy. There are rounded hypodensities in both kidneys which are too small to characterize, likely cysts. There is an exophytic mass in the inferior pole of the left kidney measuring 4.5 x 4.3 x 3.8 cm which has increased in size. No stones in the kidneys or ureters. No hydronephrosis. No perinephric or periureteral stranding. Urinary bladder is unremarkable. GI AND BOWEL: Stomach demonstrates no acute abnormality. There is some wall thickening of jejunal loops in the left abdomen with interloop fluid. The appendix appears normal. There is no bowel obstruction. REPRODUCTIVE ORGANS: No acute abnormality. PERITONEUM AND RETROPERITONEUM: There is small volume of ascites throughout the abdomen and pelvis. No free air. VASCULATURE: Aorta is normal in caliber. Calcifications of the aorta, iliac arteries, and branch vessels. ABDOMINAL AND PELVIS LYMPH NODES: No lymphadenopathy. BONES AND SOFT TISSUES: There is acute nondisplaced anterior right 6th rib fracture. Lucent lesion in the superior left acetabulum is unchanged from 2023. No acute fractures are identified in the abdomen or pelvis. There is mild diffuse bony wall edema. There are small fat containing bilateral inguinal hernias. No focal soft tissue abnormality. IMPRESSION: 1. Acute nondisplaced anterior right 6th rib fracture. 2. Trace right pleural effusion. Stable cardiomegaly with small pericardial effusion. 3. Exophytic mass in the inferior pole of the left kidney measuring 4.5 x 4.3 x 3.8 cm, increased in size. Renal cell carcinoma cannot be excluded. Recommend urology consultation. 4. Wall thickening of jejunal loops in the left abdomen with interloop fluid may represent nonspecific enteritis . No bowel obstruction. 5. Small  volume ascites throughout the abdomen and pelvis. 6. Hepatomegaly with mildly nodular contour suggesting cirrhosis. Electronically signed by: Greig Pique MD 09/12/2024 12:02 AM EST RP Workstation: [id number]   CT MAXILLOFACIAL WO CONTRAST Result Date: 09/11/2024 EXAM: CT OF THE FACE WITHOUT CONTRAST 09/11/2024 11:46:54 PM TECHNIQUE: CT of the face was performed without the administration of intravenous contrast. Multiplanar reformatted images are provided for review. Automated exposure control, iterative reconstruction, and/or weight based adjustment of the  mA/kV was utilized to reduce the radiation dose to as low as reasonably achievable. COMPARISON: None available. CLINICAL HISTORY: Facial trauma, blunt. FINDINGS: FACIAL BONES: No acute facial fracture. No mandibular dislocation. No suspicious bone lesion. ORBITS: Globes are intact. No acute traumatic injury. No inflammatory change. SINUSES AND MASTOIDS: Mild mucosal thickening in the right sphenoid and bilateral maxillary sinuses. Mastoid air cells are clear. SOFT TISSUES: No acute abnormality. IMPRESSION: 1. No acute facial fracture. Electronically signed by: Pinkie Pebbles MD 09/11/2024 11:55 PM EST RP Workstation: [id number]   CT CERVICAL SPINE WO CONTRAST Result Date: 09/11/2024 EXAM: CT CERVICAL SPINE WITHOUT CONTRAST 09/11/2024 11:46:54 PM TECHNIQUE: CT of the cervical spine was performed without the administration of intravenous contrast. Multiplanar reformatted images are provided for review. Automated exposure control, iterative reconstruction, and/or weight based adjustment of the mA/kV was utilized to reduce the radiation dose to as low as reasonably achievable. COMPARISON: 09/05/2024. CLINICAL HISTORY: Polytrauma, blunt FINDINGS: BONES AND ALIGNMENT: No acute fracture or traumatic malalignment. DEGENERATIVE CHANGES: No significant degenerative changes. SOFT TISSUES: No prevertebral soft tissue swelling. IMPRESSION: 1. No significant  abnormality Electronically signed by: Pinkie Pebbles MD 09/11/2024 11:54 PM EST RP Workstation: [id number]   CT HEAD WO CONTRAST Result Date: 09/11/2024 EXAM: CT HEAD WITHOUT 09/11/2024 11:46:54 PM TECHNIQUE: CT of the head was performed without the administration of intravenous contrast. Automated exposure control, iterative reconstruction, and/or weight based adjustment of the mA/kV was utilized to reduce the radiation dose to as low as reasonably achievable. COMPARISON: 09/05/2024 CLINICAL HISTORY: Head trauma, moderate-severe FINDINGS: BRAIN AND VENTRICLES: Trace subarachnoid hemorrhage along the right sylvian fissure has essentially resolved. Global cortical atrophy. Subcortical and periventricular small vessel ischemic changes. Intracranial atherosclerosis. No mass effect or midline shift. No extra-axial fluid collection. No evidence of acute infarct. No hydrocephalus. ORBITS: No acute abnormality. SINUSES AND MASTOIDS: No acute abnormality. SOFT TISSUES AND SKULL: Right frontal scalp contusion. No acute skull fracture. IMPRESSION: 1. No acute intracranial abnormality. 2. Trace subarachnoid hemorrhage along the right sylvian fissure has essentially resolved. 3. Right frontal scalp contusion. Electronically signed by: Pinkie Pebbles MD 09/11/2024 11:53 PM EST RP Workstation: [id number]    EKG: Independently reviewed. Paced rhythm.   Assessment/Plan   1. Hyperkalemia; ESRD  - Serum potassium is >7.5 and BUN 92 on admission after missing HD on 12/12  - Temporizing measures were performed in the ED and nephrology is arranging urgent dialysis  - Continue cardiac monitoring, stop losartan , follow serial potassium levels and repeat treatment if needed, restrict fluids, renally-dose medications   2. Generalized weakness; falls  - Patient has had recurrent falls that he attributes to generalized weakness  - No focal deficits appreciated  - Consult PT   3. Rib fracture  - Single non-displaced  rib fracture noted on CT  - Incentive spirometry, pain-control    4. Chronic HFrEF  - EF was 20-25% on echo from August 2025; he has an ICD  - Restrict fluids, stop losartan  in light of hyperkalemia, monitor weight and I/Os   5. Type II DM  - Check CBGs and use low-intensity SSI for now   6. Anemia; thrombocytopenia  - Hgb appears stable with no overt bleeding  - Chronic thrombocytopenia also appears stable, likely related to chronic liver disease   7. Left kidney lesion  - Noted incidentally on CT in ED, renal cell carcinoma not excluded  - Will need urology referral    DVT prophylaxis: SCDs  Code Status: Full  Level of Care: Level of  care: Progressive Family Communication: Family at bedside   Disposition Plan:  Patient is from: Home  Anticipated d/c is to: TBD Anticipated d/c date is: 09/15/24  Patient currently: Pending resolution of hyperkalemia, PT evaluation  Consults called: Nephrology  Admission status: Inpatient     Evalene GORMAN Sprinkles, MD [facility]  09/12/2024, 1:23 AM       [1] No Known Allergies

## 2024-09-13 DIAGNOSIS — Z09 Encounter for follow-up examination after completed treatment for conditions other than malignant neoplasm: Principal | ICD-10-CM

## 2024-09-13 MED FILL — ATORVASTATIN 20 MG TABLET: ORAL | 90 days supply | Qty: 90 | Fill #2

## 2024-09-16 DIAGNOSIS — I639 Cerebral infarction, unspecified: Principal | ICD-10-CM

## 2024-09-16 MED ORDER — ASPIRIN 81 MG TABLET,DELAYED RELEASE
ORAL_TABLET | Freq: Every day | ORAL | 3 refills | 90.00000 days | Status: CP
Start: 2024-09-16 — End: 2025-09-16

## 2024-09-24 ENCOUNTER — Other Ambulatory Visit: Payer: Self-pay

## 2024-09-24 ENCOUNTER — Emergency Department
Admission: EM | Admit: 2024-09-24 | Discharge: 2024-09-29 | Disposition: E | Payer: Self-pay | Attending: Emergency Medicine | Admitting: Emergency Medicine

## 2024-09-24 ENCOUNTER — Emergency Department: Payer: Self-pay

## 2024-09-24 DIAGNOSIS — R531 Weakness: Secondary | ICD-10-CM

## 2024-09-24 DIAGNOSIS — Z950 Presence of cardiac pacemaker: Secondary | ICD-10-CM | POA: Insufficient documentation

## 2024-09-24 DIAGNOSIS — N186 End stage renal disease: Secondary | ICD-10-CM | POA: Insufficient documentation

## 2024-09-24 DIAGNOSIS — I120 Hypertensive chronic kidney disease with stage 5 chronic kidney disease or end stage renal disease: Secondary | ICD-10-CM | POA: Insufficient documentation

## 2024-09-24 DIAGNOSIS — I469 Cardiac arrest, cause unspecified: Secondary | ICD-10-CM | POA: Insufficient documentation

## 2024-09-24 DIAGNOSIS — E875 Hyperkalemia: Secondary | ICD-10-CM | POA: Insufficient documentation

## 2024-09-24 DIAGNOSIS — E1122 Type 2 diabetes mellitus with diabetic chronic kidney disease: Secondary | ICD-10-CM | POA: Insufficient documentation

## 2024-09-24 DIAGNOSIS — Z992 Dependence on renal dialysis: Secondary | ICD-10-CM | POA: Insufficient documentation

## 2024-09-24 LAB — COMPREHENSIVE METABOLIC PANEL WITH GFR
ALT: 17 U/L (ref 0–44)
AST: 21 U/L (ref 15–41)
Albumin: 4.4 g/dL (ref 3.5–5.0)
Alkaline Phosphatase: 162 U/L — ABNORMAL HIGH (ref 38–126)
Anion gap: 20 — ABNORMAL HIGH (ref 5–15)
BUN: 75 mg/dL — ABNORMAL HIGH (ref 8–23)
CO2: 20 mmol/L — ABNORMAL LOW (ref 22–32)
Calcium: 7.6 mg/dL — ABNORMAL LOW (ref 8.9–10.3)
Chloride: 92 mmol/L — ABNORMAL LOW (ref 98–111)
Creatinine, Ser: 9.84 mg/dL — ABNORMAL HIGH (ref 0.61–1.24)
GFR, Estimated: 5 mL/min — ABNORMAL LOW
Glucose, Bld: 122 mg/dL — ABNORMAL HIGH (ref 70–99)
Potassium: >7.5 mmol/L (ref 3.5–5.1)
Sodium: 132 mmol/L — ABNORMAL LOW (ref 135–145)
Total Bilirubin: 0.6 mg/dL (ref 0.0–1.2)
Total Protein: 8 g/dL (ref 6.5–8.1)

## 2024-09-24 LAB — CBC
HCT: 28.7 % — ABNORMAL LOW (ref 39.0–52.0)
Hemoglobin: 9.3 g/dL — ABNORMAL LOW (ref 13.0–17.0)
MCH: 31.5 pg (ref 26.0–34.0)
MCHC: 32.4 g/dL (ref 30.0–36.0)
MCV: 97.3 fL (ref 80.0–100.0)
Platelets: 107 K/uL — ABNORMAL LOW (ref 150–400)
RBC: 2.95 MIL/uL — ABNORMAL LOW (ref 4.22–5.81)
RDW: 14 % (ref 11.5–15.5)
WBC: 4.2 K/uL (ref 4.0–10.5)
nRBC: 0 % (ref 0.0–0.2)

## 2024-09-24 LAB — TROPONIN T, HIGH SENSITIVITY: Troponin T High Sensitivity: 230 ng/L (ref 0–19)

## 2024-09-24 LAB — LIPASE, BLOOD: Lipase: 16 U/L (ref 11–51)

## 2024-09-24 LAB — CBG MONITORING, ED: Glucose-Capillary: 126 mg/dL — ABNORMAL HIGH (ref 70–99)

## 2024-09-24 MED ORDER — CALCIUM CHLORIDE 10 % IV SOLN
INTRAVENOUS | Status: AC
  Filled 2024-09-24: qty 40

## 2024-09-24 MED ORDER — DEXTROSE 50 % IV SOLN
25.0000 g | Freq: Once | INTRAVENOUS | Status: DC
  Filled 2024-09-24: qty 50

## 2024-09-24 MED ORDER — CALCIUM GLUCONATE 10 % IV SOLN
1.0000 g | Freq: Once | INTRAVENOUS | Status: AC
  Administered 2024-09-24: 1 g via INTRAVENOUS

## 2024-09-24 MED ORDER — CALCIUM GLUCONATE 10 % IV SOLN
INTRAVENOUS | Status: AC
  Filled 2024-09-24: qty 10

## 2024-09-24 MED ORDER — PATIROMER SORBITEX CALCIUM 8.4 G PO PACK
16.8000 g | PACK | Freq: Every day | ORAL | Status: DC
  Filled 2024-09-24: qty 2

## 2024-09-24 MED ORDER — NOREPINEPHRINE 4 MG/250ML-% IV SOLN: 0.0000 ug/min | INTRAVENOUS | Status: DC

## 2024-09-24 MED ORDER — INSULIN ASPART 100 UNIT/ML IJ SOLN
10.0000 [IU] | Freq: Once | INTRAMUSCULAR | Status: AC
  Administered 2024-09-24: 10 [IU] via INTRAVENOUS
  Filled 2024-09-24: qty 10

## 2024-09-24 MED ORDER — EPINEPHRINE 1 MG/10ML IV SOSY
PREFILLED_SYRINGE | INTRAVENOUS | Status: AC | PRN
  Administered 2024-09-24: 1 mg via INTRAVENOUS

## 2024-09-24 MED ORDER — ROCURONIUM BROMIDE 10 MG/ML (PF) SYRINGE
PREFILLED_SYRINGE | INTRAVENOUS | Status: AC
  Filled 2024-09-24: qty 10

## 2024-09-24 MED ORDER — SODIUM BICARBONATE 8.4 % IV SOLN
INTRAVENOUS | Status: AC | PRN
  Administered 2024-09-24: 50 meq via INTRAVENOUS

## 2024-09-24 MED ORDER — CALCIUM GLUCONATE-NACL 1-0.675 GM/50ML-% IV SOLN
1.0000 g | Freq: Once | INTRAVENOUS | Status: DC
  Filled 2024-09-24: qty 50

## 2024-09-24 MED ORDER — NOREPINEPHRINE 4 MG/250ML-% IV SOLN
INTRAVENOUS | Status: AC
  Filled 2024-09-24: qty 250

## 2024-09-24 MED ORDER — SODIUM BICARBONATE 8.4 % IV SOLN
INTRAVENOUS | Status: AC
  Administered 2024-09-24: 100 meq via INTRAVENOUS
  Filled 2024-09-24: qty 150

## 2024-09-24 MED ORDER — SODIUM BICARBONATE 8.4 % IV SOLN
100.0000 meq | Freq: Once | INTRAVENOUS | Status: AC
  Administered 2024-09-24: 100 meq via INTRAVENOUS

## 2024-09-24 MED ORDER — SODIUM CHLORIDE 0.9 % IV BOLUS: 500.0000 mL | Freq: Once | INTRAVENOUS | Status: DC

## 2024-09-24 MED ORDER — EPINEPHRINE 1 MG/10ML IV SOSY
1.0000 mg | PREFILLED_SYRINGE | Freq: Once | INTRAVENOUS | Status: AC
  Administered 2024-09-24: 1 mg via INTRAVENOUS

## 2024-09-24 MED ORDER — ETOMIDATE 2 MG/ML IV SOLN
INTRAVENOUS | Status: AC
  Filled 2024-09-24: qty 10

## 2024-09-29 NOTE — ED Notes (Addendum)
"  No pulse. Compressions started   "

## 2024-09-29 NOTE — Progress Notes (Signed)
" °   10/14/24 1900  Spiritual Encounters  Type of Visit Initial  Care provided to: Family  Conversation partners present during encounter Nurse  Referral source Code page  Reason for visit Code  OnCall Visit Yes   Chaplain responded to code page. Chaplain met with son, daughter, and daughter-in-law, and 2 grandchildren outside the room and in the family waiting room. Chaplain provided compassionate presence and reflective listening as family spoke about patient's challenges then family declined chaplain services. Chaplain provided hospitality and presence as needed for care team. Chaplain is available for follow up if needed. "

## 2024-09-29 NOTE — ED Notes (Signed)
No pulse, compressions restarted

## 2024-09-29 NOTE — ED Notes (Signed)
 Shock delivered. No pulses. Compression resumed.

## 2024-09-29 NOTE — Code Documentation (Signed)
 EKG done

## 2024-09-29 NOTE — Progress Notes (Addendum)
 PCCM Consult / Sign-Off Note  PCCM was consulted for a 69 year old male with ESRD on hemodialysis (MWF), HFrEF (LVEF 20%) status post biventricular ICD, who presented with nausea, dizziness, and shortness of breath after missing multiple hemodialysis sessions over the prior week. The patient was found to have severe hyperkalemia with associated EKG changes, felt to be secondary to missed dialysis in the setting of ESRD.  The patient subsequently developed cardiac arrest, presumed secondary to malignant arrhythmia from hyperkalemia and underlying severe cardiomyopathy. Advanced cardiac life support was initiated in the emergency department, including administration of calcium  gluconate, sodium bicarbonate , insulin , and epinephrine . The patient achieved transient ROSC but lost pulses again shortly thereafter.  Given poor prognosis, goals of care were discussed with family by EDP and resuscitative efforts were discontinued per family wishes. The patient was pronounced deceased in the emergency department by the ED attending with family present at bedside.  PCCM will sign off.    Almarie Nose DNP, CCRN, FNP-C, AGACNP-BC Acute Care & Family Nurse Practitioner [facility] PCCM on call pager [phone]

## 2024-09-29 NOTE — ED Notes (Addendum)
 Called [facility] at 21:00  Reference number [phone]  Spoke with Rica

## 2024-09-29 NOTE — ED Notes (Signed)
"  Compressions started  "

## 2024-09-29 NOTE — ED Notes (Addendum)
 This RN notified MD that pt daughter would like to speak with him regarding pt condition. MD stated he would visit pt shortly.

## 2024-09-29 NOTE — ED Provider Notes (Signed)
----------------------------------------- °  5:42 PM on 2024-10-03 ----------------------------------------- EKG viewed and interpreted by myself shows what appears to be dual paced rhythm at 88 bpm with a widened QRS, normal axis.  There is slight elevation seen in lead III with lateral depressions.  In comparing to the patient's old EKGs however there appears to be some EKGs with lateral changes as well.  I have personally seen and evaluated patient he has no chest pain complaints.  Patient is on hemodialysis does not make urine his last dialysis session was Monday so he has not had dialysis in 5 days.  We will check labs including cardiac enzymes however I am more concerned about the patient's potential electrolyte abnormality such as his potassium level again denies any chest symptoms whatsoever.  Do not believe EKG meets criteria for STEMI at this time given his dual paced nature and in comparison to his old EKGs.   Dorothyann Drivers, MD 10-03-2024 1806

## 2024-09-29 NOTE — ED Notes (Signed)
 This RN at bedside with KPAD, MD. Pt is increasingly lethargic and c/o difficulty breathing while sitting up. Head of bed raised due to pt c/o nausea. Crash cart brought to bedside.

## 2024-09-29 NOTE — ED Notes (Signed)
 Charging zoll, 125J.

## 2024-09-29 NOTE — ED Notes (Signed)
 EKG reading showing acute MI/STEMI. Patient endorses SOB, chest pressure, nausea, intermittent numbness/tingling in hands and feet. MD to triage to assess patient. Per MD, send labs and he is calling to talk with on-call cards regarding plan of care. Charge notified that patient needs next bed.

## 2024-09-29 NOTE — ED Notes (Signed)
 [facility] labs sent to lab with blood draw.

## 2024-09-29 NOTE — ED Notes (Signed)
Pulse check, no pulse. Compressions restarted

## 2024-09-29 NOTE — ED Notes (Signed)
 Pulse check, no pulse.

## 2024-09-29 NOTE — ED Notes (Signed)
 Pulse check, no pulse. Danny [name] applied.

## 2024-09-29 NOTE — ED Notes (Signed)
 Compressions stopped. Pulses returned.

## 2024-09-29 NOTE — ED Notes (Signed)
"  Compressions restarted  "

## 2024-09-29 NOTE — ED Notes (Signed)
 Compressions resumed.

## 2024-09-29 NOTE — ED Provider Notes (Addendum)
"  [facility] Provider Note    None    (approximate)  History   Chief Complaint: Weakness  HPI  Danny [name] is a 69 y.o. male with medical history of diabetes, hypertension, pacemaker/defibrillator, ESRD on HD Monday/Wednesday/Friday presents to the emergency department for generalized weakness, nausea vomiting and diarrhea for the last 3 days.  Patient missed dialysis on Wednesday, was supposed to go today but did not go today.  His last dialysis session was on Monday.  Daughter states the patient does not make any urine.  Patient's main complaint in the emergency department is generalized weakness.  Patient found to be afebrile reassuring room air saturation.  Patient denies any chest pain.  Physical Exam   Triage Vital Signs: ED Triage Vitals  Encounter Vitals Group     BP 2024/10/22 1730 (!) 158/79     Girls Systolic BP Percentile --      Girls Diastolic BP Percentile --      Boys Systolic BP Percentile --      Boys Diastolic BP Percentile --      Pulse Rate 10-22-24 1730 (!) 58     Resp October 22, 2024 1730 12     Temp 22-Oct-2024 1730 98.1 F (36.7 C)     Temp Source 10-22-2024 1730 Oral     SpO2 10-22-2024 1730 100 %     Weight 2024-10-22 1727 208 lb 5.4 oz (94.5 kg)     Height 10/22/2024 1727 5' 9 (1.753 m)     Head Circumference --      Peak Flow --      Pain Score Oct 22, 2024 1726 10     Pain Loc --      Pain Education --      Exclude from Growth Chart --     Most recent vital signs: Vitals:   October 22, 2024 1730 10-22-2024 1804  BP: (!) 158/79   Pulse: (!) 58   Resp: 12   Temp: 98.1 F (36.7 C)   SpO2: 100% 99%    General: Patient is awake somewhat somnolent but in no distress. CV:  Good peripheral perfusion.  Regular rate and rhythm  Resp:  Normal effort.  Equal breath sounds bilaterally.  Abd:  No distention.  Soft, nontender.  No rebound or guarding.  ED Results / Procedures / Treatments   EKG  I reviewed the EKG as stated in my other  note.  He has a dual paced rhythm, that is largely unchanged from prior EKGs.  Given the patient is on dialysis and has not had dialysis for 5 days I am more concerned for potential electrolyte abnormalities leading to slight EKG changes.  RADIOLOGY  I have reviewed and interpreted chest x-ray images.  Patient has some interstitial changes. Radiology reading is no acute abnormality.   MEDICATIONS ORDERED IN ED: Medications - No data to display   IMPRESSION / MDM / ASSESSMENT AND PLAN / ED COURSE  I reviewed the triage vital signs and the nursing notes.  Patient's presentation is most consistent with acute presentation with potential threat to life or bodily function.  Patient presents emergency department for generalized weakness.  Patient has not had dialysis for 5 days.  States slight shortness of breath at times denies any chest pain.  States he has been nauseated with vomiting and diarrhea for the last 3 days.  We will check labs including a CBC chemistry and troponin x 2.  Will obtain a chest x-ray COVID/flu swab.  We will IV hydrate with 500 cc of fluid while awaiting further results.  EKG although appears abnormal I do not believe is consistent with STEMI and I am more concerned for possible electrolyte abnormalities given 5 days without dialysis.  Patient has no chest pain complaints.  The patient deteriorated developed a very wide-complex rhythm.  I was on the phone with lab as we had not yet received his chemistry result.  She reported a critical high potassium greater than 7.5.  At this time the patient lost a pulse.  I went to the room immediately.  Patient was given calcium  chloride and compressions were started.  We performed approximately 10 minutes of CPR we did have return of pulses.  I spoke to nephrology and they were sending in an emergent dialysis team however while on the phone with nephrology the patient coded once again.  Patient was coded for approximately 10 more minutes.   Throughout the code patient received approximately 3 A of sodium HCO3, 4 A of calcium , 3 A of epinephrine , patient was intubated with etomidate  and rocuronium .  Prior to coding patient did receive insulin  10 units and glucose 1 amp.  We did get pulses back once again.  I spoke to nephrology and they recommended the patient be transferred immediately to the ICU for emergent dialysis.  I spoke to ICU NP who came down immediately to the room.  I spoke to the family they are on board with the plan to state if the patient were to lose pulses again they would not like to start resuscitation.  After approximately 10 minutes of having a pulse, patient unfortunately lost pulses once again.  Family is in agreement that they would not like the patient resuscitated any longer.  INTUBATION Performed by: Meryn Sarracino  Required items: required blood products, implants, devices, and special equipment available Patient identity confirmed: provided demographic data and hospital-assigned identification number Time out: Immediately prior to procedure a time out was called to verify the correct patient, procedure, equipment, support staff and site/side marked as required.  Indications: Airway protection, respiratory failure, CPR  Intubation method: S4 Glidescope Laryngoscopy   Preoxygenation: 100% BVM  Sedatives: 20 mg etomidate  Paralytic: 100 mg of rocuronium   Tube Size: 8.0 cuffed  Post-procedure assessment: chest rise and ETCO2 monitor Breath sounds: equal and absent over the epigastrium Tube secured with: ETT holder  Cardiopulmonary Resuscitation (CPR) Procedure Note Directed/Performed by: Franky Moores I personally directed ancillary staff and/or performed CPR in an effort to regain return of spontaneous circulation and to maintain cardiac, neuro and systemic perfusion.   CRITICAL CARE Performed by: Franky Moores   Total critical care time: 30 minutes  Critical care time was  exclusive of separately billable procedures and treating other patients.  Critical care was necessary to treat or prevent imminent or life-threatening deterioration.  Critical care was time spent personally by me on the following activities: development of treatment plan with patient and/or surrogate as well as nursing, discussions with consultants, evaluation of patient's response to treatment, examination of patient, obtaining history from patient or surrogate, ordering and performing treatments and interventions, ordering and review of laboratory studies, ordering and review of radiographic studies, pulse oximetry and re-evaluation of patient's condition.  FINAL CLINICAL IMPRESSION(S) / ED DIAGNOSES   Weakness Hyperkalemia Cardiac arrest   Note:  This document was prepared using Dragon voice recognition software and may include unintentional dictation errors.   Moores Franky, MD 2024/09/26 DURENDA    Moores Franky, MD Sep 26, 2024  2138 ° °"

## 2024-09-29 NOTE — ED Notes (Addendum)
 Triage RN that brought pt to room 16 and gave this RN report also notified MD of pt's heart rhythm.

## 2024-09-29 NOTE — ED Triage Notes (Signed)
 Pt presents to ED from home C/O generalized weakness, n/v/d X 3 days. Dialysis patient MWF, missed Wed and today d/t weakness. Recent admission s/p fall with broken rib. Also endorses worsening blurred vision X 2 days.   Daughter translated for triage.

## 2024-09-29 NOTE — ED Notes (Signed)
 No pulse

## 2024-09-29 NOTE — ED Notes (Signed)
 Pulse check. Pulses returned. Sinus tach on monitor.

## 2024-09-29 DEATH — deceased
